# Patient Record
Sex: Female | Born: 1935 | Hispanic: No | Smoking: Never smoker
Health system: Southern US, Community
[De-identification: ages and names within clinical notes are randomized; demographics above are authoritative.]

## PROBLEM LIST (undated history)

## (undated) DIAGNOSIS — M549 Dorsalgia, unspecified: Secondary | ICD-10-CM

## (undated) DIAGNOSIS — F1190 Opioid use, unspecified, uncomplicated: Secondary | ICD-10-CM

## (undated) DIAGNOSIS — M810 Age-related osteoporosis without current pathological fracture: Secondary | ICD-10-CM

## (undated) DIAGNOSIS — I509 Heart failure, unspecified: Secondary | ICD-10-CM

## (undated) DIAGNOSIS — I341 Nonrheumatic mitral (valve) prolapse: Secondary | ICD-10-CM

## (undated) DIAGNOSIS — I10 Essential (primary) hypertension: Secondary | ICD-10-CM

## (undated) DIAGNOSIS — I2510 Atherosclerotic heart disease of native coronary artery without angina pectoris: Secondary | ICD-10-CM

## (undated) DIAGNOSIS — K219 Gastro-esophageal reflux disease without esophagitis: Secondary | ICD-10-CM

## (undated) DIAGNOSIS — M1990 Unspecified osteoarthritis, unspecified site: Secondary | ICD-10-CM

## (undated) DIAGNOSIS — IMO0001 Reserved for inherently not codable concepts without codable children: Secondary | ICD-10-CM

## (undated) DIAGNOSIS — H269 Unspecified cataract: Secondary | ICD-10-CM

## (undated) DIAGNOSIS — R52 Pain, unspecified: Secondary | ICD-10-CM

## (undated) DIAGNOSIS — E785 Hyperlipidemia, unspecified: Secondary | ICD-10-CM

## (undated) DIAGNOSIS — G8929 Other chronic pain: Secondary | ICD-10-CM

## (undated) HISTORY — DX: Other chronic pain: G89.29

## (undated) HISTORY — PX: BLADDER SURGERY: SHX569

## (undated) HISTORY — PX: APPENDECTOMY: SHX54

## (undated) HISTORY — PX: BREAST SURGERY: SHX581

## (undated) HISTORY — PX: SHOULDER SURGERY: SHX246

## (undated) HISTORY — DX: Unspecified cataract: H26.9

## (undated) HISTORY — DX: Hyperlipidemia, unspecified: E78.5

## (undated) HISTORY — DX: Nonrheumatic mitral (valve) prolapse: I34.1

## (undated) HISTORY — PX: ABDOMINAL HYSTERECTOMY: SHX81

## (undated) HISTORY — DX: Unspecified osteoarthritis, unspecified site: M19.90

## (undated) HISTORY — DX: Age-related osteoporosis without current pathological fracture: M81.0

---

## 2005-08-08 ENCOUNTER — Ambulatory Visit (HOSPITAL_COMMUNITY): Admission: RE | Admit: 2005-08-08 | Discharge: 2005-08-08 | Payer: Self-pay | Admitting: Gastroenterology

## 2005-08-28 ENCOUNTER — Encounter: Admission: RE | Admit: 2005-08-28 | Discharge: 2005-08-28 | Payer: Self-pay | Admitting: Family Medicine

## 2005-11-03 ENCOUNTER — Encounter: Admission: RE | Admit: 2005-11-03 | Discharge: 2005-11-03 | Payer: Self-pay | Admitting: Family Medicine

## 2006-03-22 ENCOUNTER — Encounter: Admission: RE | Admit: 2006-03-22 | Discharge: 2006-03-22 | Payer: Self-pay | Admitting: Family Medicine

## 2006-04-24 ENCOUNTER — Encounter: Admission: RE | Admit: 2006-04-24 | Discharge: 2006-04-24 | Payer: Self-pay | Admitting: Orthopedic Surgery

## 2006-12-23 ENCOUNTER — Emergency Department (HOSPITAL_COMMUNITY): Admission: EM | Admit: 2006-12-23 | Discharge: 2006-12-23 | Payer: Self-pay | Admitting: Emergency Medicine

## 2007-02-14 ENCOUNTER — Encounter: Admission: RE | Admit: 2007-02-14 | Discharge: 2007-02-14 | Payer: Self-pay | Admitting: Internal Medicine

## 2007-11-12 ENCOUNTER — Encounter: Admission: RE | Admit: 2007-11-12 | Discharge: 2007-11-12 | Payer: Self-pay | Admitting: General Practice

## 2008-03-24 ENCOUNTER — Encounter: Admission: RE | Admit: 2008-03-24 | Discharge: 2008-03-24 | Payer: Self-pay | Admitting: Internal Medicine

## 2008-04-01 ENCOUNTER — Encounter: Admission: RE | Admit: 2008-04-01 | Discharge: 2008-04-01 | Payer: Self-pay | Admitting: Internal Medicine

## 2008-09-16 ENCOUNTER — Encounter: Admission: RE | Admit: 2008-09-16 | Discharge: 2008-09-16 | Payer: Self-pay | Admitting: Internal Medicine

## 2008-10-14 ENCOUNTER — Encounter: Admission: RE | Admit: 2008-10-14 | Discharge: 2008-10-14 | Payer: Self-pay | Admitting: Internal Medicine

## 2008-10-14 ENCOUNTER — Other Ambulatory Visit: Admission: RE | Admit: 2008-10-14 | Discharge: 2008-10-14 | Payer: Self-pay | Admitting: Interventional Radiology

## 2008-10-14 ENCOUNTER — Encounter (INDEPENDENT_AMBULATORY_CARE_PROVIDER_SITE_OTHER): Payer: Self-pay | Admitting: Interventional Radiology

## 2009-05-07 ENCOUNTER — Encounter: Admission: RE | Admit: 2009-05-07 | Discharge: 2009-05-07 | Payer: Self-pay | Admitting: Internal Medicine

## 2009-06-07 ENCOUNTER — Ambulatory Visit: Payer: Self-pay | Admitting: Vascular Surgery

## 2009-09-13 ENCOUNTER — Ambulatory Visit: Payer: Self-pay | Admitting: Vascular Surgery

## 2009-11-01 ENCOUNTER — Ambulatory Visit: Payer: Self-pay | Admitting: Vascular Surgery

## 2009-11-08 ENCOUNTER — Ambulatory Visit: Payer: Self-pay | Admitting: Vascular Surgery

## 2010-04-14 ENCOUNTER — Inpatient Hospital Stay (HOSPITAL_COMMUNITY): Admission: RE | Admit: 2010-04-14 | Discharge: 2010-04-15 | Payer: Self-pay | Admitting: Orthopedic Surgery

## 2010-05-03 ENCOUNTER — Emergency Department (HOSPITAL_COMMUNITY): Admission: EM | Admit: 2010-05-03 | Discharge: 2010-05-03 | Payer: Self-pay | Admitting: Emergency Medicine

## 2010-08-04 ENCOUNTER — Encounter: Admission: RE | Admit: 2010-08-04 | Discharge: 2010-08-04 | Payer: Self-pay | Admitting: Internal Medicine

## 2010-09-12 ENCOUNTER — Encounter: Admission: RE | Admit: 2010-09-12 | Discharge: 2010-09-12 | Payer: Self-pay | Admitting: Internal Medicine

## 2010-10-30 ENCOUNTER — Encounter: Payer: Self-pay | Admitting: Internal Medicine

## 2010-12-24 LAB — CK TOTAL AND CKMB (NOT AT ARMC)
CK, MB: 2.1 ng/mL (ref 0.3–4.0)
Total CK: 174 U/L (ref 7–177)

## 2010-12-24 LAB — COMPREHENSIVE METABOLIC PANEL
ALT: 17 U/L (ref 0–35)
AST: 24 U/L (ref 0–37)
Albumin: 3.8 g/dL (ref 3.5–5.2)
Alkaline Phosphatase: 90 U/L (ref 39–117)
BUN: 6 mg/dL (ref 6–23)
CO2: 31 mEq/L (ref 19–32)
Calcium: 9.5 mg/dL (ref 8.4–10.5)
Chloride: 100 mEq/L (ref 96–112)
Creatinine, Ser: 0.72 mg/dL (ref 0.4–1.2)
GFR calc Af Amer: >60 mL/min (ref >60)
GFR calc non Af Amer: >60 mL/min (ref >60)
Glucose, Bld: 114 mg/dL — ABNORMAL HIGH (ref 70–99)
Potassium: 3.1 mEq/L — ABNORMAL LOW (ref 3.5–5.1)
Sodium: 140 mEq/L (ref 135–145)
Total Bilirubin: 0.7 mg/dL (ref 0.3–1.2)
Total Protein: 6.9 g/dL (ref 6.0–8.3)

## 2010-12-24 LAB — URINALYSIS, ROUTINE W REFLEX MICROSCOPIC
Bilirubin Urine: NEGATIVE
Glucose, UA: NEGATIVE mg/dL
Hgb urine dipstick: NEGATIVE
Ketones, ur: NEGATIVE mg/dL
Nitrite: NEGATIVE
Protein, ur: NEGATIVE mg/dL
Specific Gravity, Urine: 1.006 (ref 1.005–1.030)
Urobilinogen, UA: 0.2 mg/dL (ref 0.0–1.0)
pH: 8 (ref 5.0–8.0)

## 2010-12-24 LAB — DIFFERENTIAL
Basophils Absolute: 0 10*3/uL (ref 0.0–0.1)
Basophils Relative: 1 % (ref 0–1)
Eosinophils Absolute: 0 10*3/uL (ref 0.0–0.7)
Eosinophils Relative: 1 % (ref 0–5)
Lymphocytes Relative: 13 % (ref 12–46)
Lymphs Abs: 0.6 10*3/uL — ABNORMAL LOW (ref 0.7–4.0)
Monocytes Absolute: 0.4 10*3/uL (ref 0.1–1.0)
Monocytes Relative: 8 % (ref 3–12)
Neutro Abs: 3.6 10*3/uL (ref 1.7–7.7)
Neutrophils Relative %: 77 % (ref 43–77)

## 2010-12-24 LAB — CBC
HCT: 35.9 % — ABNORMAL LOW (ref 36.0–46.0)
Hemoglobin: 12.4 g/dL (ref 12.0–15.0)
MCH: 29.5 pg (ref 26.0–34.0)
MCHC: 34.4 g/dL (ref 30.0–36.0)
MCV: 85.8 fL (ref 78.0–100.0)
Platelets: 424 10*3/uL — ABNORMAL HIGH (ref 150–400)
RBC: 4.19 MIL/uL (ref 3.87–5.11)
RDW: 13.9 % (ref 11.5–15.5)
WBC: 4.7 10*3/uL (ref 4.0–10.5)

## 2010-12-24 LAB — POCT CARDIAC MARKERS
CKMB, poc: 2.1 ng/mL (ref 1.0–8.0)
Myoglobin, poc: 149 ng/mL (ref 12–200)
Troponin i, poc: <0.05 ng/mL (ref 0.00–0.09)

## 2010-12-24 LAB — LIPASE, BLOOD: Lipase: 29 U/L (ref 11–59)

## 2010-12-25 LAB — CBC
HCT: 37.9 % (ref 36.0–46.0)
MCH: 30 pg (ref 26.0–34.0)
MCHC: 34.3 g/dL (ref 30.0–36.0)
MCV: 87.4 fL (ref 78.0–100.0)
RDW: 13.5 % (ref 11.5–15.5)

## 2010-12-25 LAB — COMPREHENSIVE METABOLIC PANEL
Alkaline Phosphatase: 66 U/L (ref 39–117)
BUN: 8 mg/dL (ref 6–23)
Calcium: 9.8 mg/dL (ref 8.4–10.5)
Creatinine, Ser: 0.72 mg/dL (ref 0.4–1.2)
Glucose, Bld: 113 mg/dL — ABNORMAL HIGH (ref 70–99)
Potassium: 3.6 mEq/L (ref 3.5–5.1)
Total Protein: 6.7 g/dL (ref 6.0–8.3)

## 2010-12-25 LAB — URINALYSIS, ROUTINE W REFLEX MICROSCOPIC
Hgb urine dipstick: NEGATIVE
Protein, ur: NEGATIVE mg/dL
Urobilinogen, UA: 0.2 mg/dL (ref 0.0–1.0)

## 2010-12-25 LAB — PROTIME-INR
INR: 0.97 (ref 0.00–1.49)
Prothrombin Time: 12.8 seconds (ref 11.6–15.2)

## 2010-12-25 LAB — APTT: aPTT: 28 seconds (ref 24–37)

## 2010-12-25 LAB — SURGICAL PCR SCREEN: Staphylococcus aureus: NEGATIVE

## 2011-02-21 NOTE — Assessment & Plan Note (Signed)
OFFICE VISIT   GEANA, WALTS E  DOB:  01-05-1931                                       11/01/2009  IONGE#:[id number]   Patient underwent laser ablation of her right proximal great saphenous  vein which occluded in the mid thigh and then a second procedure with  laser ablation of the right distal great saphenous vein from mid calf to  the mid thigh with greater than 20 stab phlebectomies for painful  varicosities.  She tolerated the procedure well, return in 1 week for  follow-up.     Quita Skye Hart [place], M.D.  Electronically Signed   JDL/MEDQ  D:  11/01/2009  T:  11/02/2009  Job:  [id number]

## 2011-02-21 NOTE — Procedures (Signed)
LOWER EXTREMITY VENOUS REFLUX EXAM   INDICATION:  Right lower extremity varicose veins, history of right  lower extremity laser surgery.   EXAM:  Using color-flow imaging and pulse Doppler spectral analysis, the  right common femoral, superficial femoral, popliteal, posterior tibial,  greater and lesser saphenous veins are evaluated.  There is no evidence  suggesting deep venous insufficiency in the right lower extremity.   The right saphenofemoral junction is not competent.  The right GSV is  not competent with the caliber as described below.   The right proximal short saphenous vein demonstrates competency.   GSV Diameter (used if found to be incompetent only)                                            Right    Left  Proximal Greater Saphenous Vein           0.77 cm  cm  Proximal-to-mid-thigh                     cm       cm  Mid thigh                                 0.29 cm  cm  Mid-distal thigh                          cm       cm  Distal thigh                              0.57 cm  cm  Knee                                      0.7 cm   cm   IMPRESSION:  1. Right greater saphenous vein reflux is identified with the caliber      ranging from 0.29 cm to 0.77 cm knee to groin with a gap in the      proximal thigh, after which lateral branch reconnects.  2. The right greater saphenous vein is not aneurysmal.  3. The right greater saphenous vein is somewhat tortuous.  4. The deep venous system is competent.  5. The right lesser saphenous vein is competent.   ___________________________________________  Quita Skye. Hart [place], M.D.   AS/MEDQ  D:  06/07/2009  T:  06/07/2009  Job:  [id number]

## 2011-02-21 NOTE — Assessment & Plan Note (Signed)
OFFICE VISIT   QUETZALY, EBNER E  DOB:  Aug 17, 1931                                       11/08/2009  ZOXWR#:[id number]   The patient returns 1 week post laser ablation of right great saphenous  vein with multiple stab phlebectomies.  She had previously had a laser  ablation performed in [place], [place], 10 or 15 years ago.  She had 2  areas of patent saphenous vein, one in the distal thigh which had been  occluded in the mid thigh and then the proximal vein was also patent,  with reflux in both areas.  This required 2 laser treatments as well as  the stab phlebectomies.  She had an excellent early result with minimal  discomfort in the thigh, no pain in the calf, and has had no distal  edema.  She wore her stocking as instructed and took the ibuprofen  without difficulty.   On exam today, she has a palpable dorsalis pedis pulse with some  moderate ecchymosis in the distal thigh where the vein was ablated.  Stab phlebectomy wounds are all healing well.  Venous duplex exam today  reveals no evidence of deep venous obstruction and total closure of the  saphenous vein at the saphenofemoral junction.  I am very pleased with  her early result.  She will wear the stockings 1 more week and return to  see [place] on a p.r.n. basis.     Quita Skye Hart [place], M.D.  Electronically Signed   JDL/MEDQ  D:  11/08/2009  T:  11/09/2009  Job:  [id number]   cc:   Deirdre Peer. Polite, M.D.

## 2011-02-21 NOTE — Consult Note (Signed)
NEW PATIENT CONSULTATION   Claudia [name], Claudia [name]  DOB:  [birth year]                                       06/07/2009  JXBJY#:[id number]   This is a new patient consultation.   Patient is a 77 year old female patient who has painful varicosities in  the right leg, which are recurrent, involving thigh, calf, and ankle  area.  She has had laser ablation performed in [place], [place] about  10 years ago on the right leg with early improvement in her symptoms,  but these recurred a few years ago and have continued to progress, and  now she describes a continuous aching, throbbing, and burning discomfort  in the thigh and calf which worsens as the day progresses.  She also has  this at night, and this is not relieved by elevation or pain medicine.  She has had no deep venous thrombosis, thrombophlebitis or pulmonary  emboli and has no history of stasis ulcers or bleeding.  She does not  wear elastic compression stockings, but her symptoms are affecting her  significantly.  She states that she is unable to stand or walk long  distances.   Past medical history is negative for diabetes, coronary artery disease,  COPD, hyperlipidemia or stroke.   PAST SURGICAL HISTORY:  1. Laser ablation, right great saphenous vein.  2. Hysterectomy.  3. Bladder tack.   FAMILY HISTORY:  Positive for varicose veins in her mother and an aunt.  Diabetes in both parents.  Coronary artery disease in her father, who  died at age 79 of myocardial infarction.  Negative for stroke.   SOCIAL HISTORY:  She is widowed, has 4 children, is retired.  Previously  worked in the dialysis unit.  Does not use tobacco or alcohol.   REVIEW OF SYSTEMS:  Positive for reflux esophagitis, constipation,  arthritis, joint pain, lumbar spine disease, and rash.  Otherwise all  systems unremarkable.   ALLERGIES:  To sulfa.   MEDICATIONS:  Please see health history exam.   PHYSICAL EXAMINATION:  Blood pressure is  133/70, heart rate 72,  respirations 14.  Generally, she is a healthy-appearing female in no  apparent distress, alert and oriented x3.  Neck is supple, 3+ carotid  pulses palpable.  No bruits are audible.  Neurologic:  Normal.  No  palpable adenopathy in the neck.  Chest:  Clear to auscultation.  Cardiovascular:  Regular rhythm.  No murmurs.  Abdomen:  Soft, nontender  with no masses.  She has 3+ femoral, popliteal, and 2+ dorsalis pedis  pulses bilaterally.  Left leg is free of varicosities.  Right leg has  bulging varicosities in the medial thigh, over the great saphenous  system, and more severely below the knee in the medial calf, extending  down onto the ankle both medially and laterally with some  hyperpigmentation and 1+ edema.  There is no active ulcer.   Venous duplex exam today reveals a deep venous system to be normal on  the right.  There is no obstruction.  She has 2 areas of severe reflux,  one in the saphenofemoral junction extending down along the course of  the greater saphenous vein medially for about 6-8 cm, where it then  collateralizes with a lateral branch.  This is a straight segment.  There is another large continuation of the great saphenous vein from the  mid thigh to the mid calf with reflux.   I think the best plan would be to treat her initially with long-leg  elastic compression stockings (20 mm - 30 mm gradient) plus elevation of  the legs and ibuprofen.  If this does not improve her symptoms, she  should have laser ablation of 2 separate segments in 1 setting, one of  the great saphenous vein and saphenofemoral junction proximally and to  the more distal great saphenous vein up to the mid thigh, and perform  stab phlebectomies at that time.  This should be followed by 2 courses  of sclerotherapy for the smaller varicosities in the lower leg distally.  Will return in 3 months.   Quita Skye Hart [place], M.D.  Electronically Signed   JDL/MEDQ  D:  06/07/2009   T:  06/07/2009  Job:  [id number]   cc:   Deirdre Peer. Polite, M.D.

## 2011-02-21 NOTE — Assessment & Plan Note (Signed)
OFFICE VISIT   ENISA, RUNYAN  DOB:  [birth year]                                       09/13/2009  ZHYQM#:[id number]   The patient returns today for continued follow-up regarding her painful  varicosities in the right thigh and calf and ankle area which have been  becoming increasingly symptomatic.  She had treatment at [place],  [place] several years ago with laser ablation ,but has had progressive  recurrence of the varicosities in the thigh and medial calf area and  continues to have aching and throbbing discomfort, which is not relieved  by long-leg elastic compression stockings (20 mm -13 mm gradient) nor  elevation or analgesics (ibuprofen).  She has been having increasing  discomfort in her ankle and foot area the longer she stands diminishing  her ability to get around.  This is affecting his daily living both  during the day and at night when she also has discomfort.   Venous duplex exam revealed gross reflux at the right saphenofemoral  junction down to the proximal thigh with a small gap in the great  saphenous vein and then another large segment of gross reflux from the  proximal calf to the mid to proximal thigh area.  She has large bulging  varicosities in the medial calf and the mid thigh proximally.   Venous duplex exam also reveals no evidence of deep venous obstruction  and she does have 2-3+ pulses on physical exam.   I think the best treatment would be laser ablation of her right great  saphenous vein which will require two separate treatments, one in the  distal great saphenous vein and one in the proximal great saphenous vein  with multiple stab phlebectomies.  We will schedule that in the near  future to relieve this nice lady's symptoms.   Quita Skye Hart [place], M.D.  Electronically Signed   JDL/MEDQ  D:  09/13/2009  T:  09/14/2009  Job:  [id number]

## 2011-02-24 NOTE — Op Note (Signed)
NAMENARCISSA, MELDER NO.:  [id number]   MEDICAL RECORD NO.:  [id number]          PATIENT TYPE:  AMB   LOCATION:  ENDO                         FACILITY:  [facility]   PHYSICIAN:  Danise Edge, M.D.   DATE OF BIRTH:  07-06-1931   DATE OF PROCEDURE:  08/08/2005  DATE OF DISCHARGE:                                 OPERATIVE REPORT   REFERRING PHYSICIAN:  Ms. Rosezetta Schlatter [facility] at [facility].   PROCEDURE INDICATIONS:  Claudia [name] is a 77 year old female born 1931-03-31. Claudia [name] has chronic gastroesophageal reflux. When she takes her  Fosamax tablet, it occasionally hangs in her distal esophagus. Claudia [name] is  also scheduled to undergo her first screening colonoscopy with polypectomy  to prevent colon cancer.   I discussed with Claudia [name] the complications associated with upper and lower  gastrointestinal endoscopy, esophageal dilation, and polypectomy including a  15 per 1,000 risk of bleeding and 1 per 1,000 risk of intestinal perforation  requiring surgical repair. Claudia [name] has signed the operative permit.   ENDOSCOPIST:  Danise Edge, M.D.   PREMEDICATION:  Versed 4 mg, Demerol 50 mg.   PROCEDURE:  Screening colonoscopy to cecum. Anal inspection and digital  rectal exam were normal. The Olympus adjustable pediatric colonoscope was  introduced into the rectum and easily advanced to the cecum. A normal-  appearing appendiceal orifice and ileocecal valve were identified. Colonic  preparation for the exam today was satisfactory.   Rectum normal. Retroflexed view of the distal rectum normal.   Sigmoid colon and descending colon normal.   Splenic flexure normal.   Transverse colon normal.   Hepatic flexure normal.   Descending colon normal.   Cecum and ileocecal valve normal.   ASSESSMENT:  Normal screening proctocolonoscopy to the cecum.   PROCEDURE:  Diagnostic esophagogastroduodenoscopy. The Olympus gastroscope  was  passed through the posterior hypopharynx into the proximal esophagus  without difficulty. The hypopharynx, larynx and vocal cords appeared normal.   Esophagoscopy:  The proximal mid and lower segments of the esophageal mucosa  appeared completely normal. The squamocolumnar junction is noted at 35 cm  from the incisor teeth. There is no endoscopic evidence for the presence of  Barrett's esophagus, esophageal mucosal scarring or esophageal stricture  formation.   Gastroscopy:  Claudia [name] has a moderate-sized hiatal hernia. Retroflexed view  of the gastric cardia and fundus was normal. The diaphragmatic hiatus is  quite patulous. The gastric body, antrum and pylorus appeared normal.   Duodenoscopy:  The duodenal bulb and second portion of duodenum and third  portion of duodenum appeared normal.   ASSESSMENT:  Gastroesophageal reflux associated with a hiatal hernia and  patulous diaphragmatic hiatus. No endoscopic evidence for the presence of  Barrett's esophagus, esophageal stricture formation, erosive esophagitis.   RECOMMENDATIONS:  Recommend switching Claudia [name] from the Fosamax tablet to  the oral liquid Fosamax. She should undergo a repeat colon polyp screen in  approximately 10 years.           ______________________________  Danise Edge, M.D.  MJ/MEDQ  D:  08/08/2005  T:  08/08/2005  Job:  [id number]   cc:   Lavonda Jumbo, M.D.  Fax: [phone]

## 2011-02-24 NOTE — Procedures (Signed)
DUPLEX DEEP VENOUS EXAM - LOWER EXTREMITY   INDICATION:  Follow up laser ablation.   HISTORY:  Edema:  0  Trauma/Surgery:  Laser ablation of right greater saphenous vein.  Pain:  Yes  PE:  No  Previous DVT:  No  Anticoagulants:  No  Other:  No   DUPLEX EXAM:                CFV   SFV   PopV  PTV    GSV                R  L  R  L  R  L  R   L  R  L  Thrombosis    0  0  0     0     0      +  Spontaneous   +  +  +     +     +      0  Phasic        +  +  +     +     +      0  Augmentation  +  +  +     +     +      0  Compressible  +  +  +     +     +      0  Competent     +  +  +     +     +      0   Legend:  + - yes  o - no  p - partial  D - decreased   IMPRESSION:  There does not appear to be any deep vein thrombus noted;  however, the right greater saphenous vein appears with thrombus  proximally to the knee level.    _____________________________  Quita Skye. Hart [place], M.D.   CB/MEDQ  D:  11/08/2009  T:  11/09/2009  Job:  [id number]

## 2011-07-20 ENCOUNTER — Other Ambulatory Visit: Payer: Self-pay | Admitting: Internal Medicine

## 2011-07-20 DIAGNOSIS — Z1231 Encounter for screening mammogram for malignant neoplasm of breast: Secondary | ICD-10-CM

## 2011-08-08 ENCOUNTER — Ambulatory Visit: Payer: Self-pay

## 2011-09-06 ENCOUNTER — Ambulatory Visit
Admission: RE | Admit: 2011-09-06 | Discharge: 2011-09-06 | Disposition: A | Discharge status: Home / Self Care | Payer: Medicare Other | Source: Ambulatory Visit | Attending: Internal Medicine | Admitting: Internal Medicine

## 2011-09-06 DIAGNOSIS — Z1231 Encounter for screening mammogram for malignant neoplasm of breast: Secondary | ICD-10-CM

## 2011-10-13 DIAGNOSIS — L299 Pruritus, unspecified: Secondary | ICD-10-CM | POA: Diagnosis not present

## 2011-10-30 DIAGNOSIS — IMO0002 Reserved for concepts with insufficient information to code with codable children: Secondary | ICD-10-CM | POA: Diagnosis not present

## 2011-10-30 DIAGNOSIS — M47812 Spondylosis without myelopathy or radiculopathy, cervical region: Secondary | ICD-10-CM | POA: Diagnosis not present

## 2011-10-30 DIAGNOSIS — M47817 Spondylosis without myelopathy or radiculopathy, lumbosacral region: Secondary | ICD-10-CM | POA: Diagnosis not present

## 2011-10-30 DIAGNOSIS — M5137 Other intervertebral disc degeneration, lumbosacral region: Secondary | ICD-10-CM | POA: Diagnosis not present

## 2011-12-25 DIAGNOSIS — M47817 Spondylosis without myelopathy or radiculopathy, lumbosacral region: Secondary | ICD-10-CM | POA: Diagnosis not present

## 2011-12-25 DIAGNOSIS — M542 Cervicalgia: Secondary | ICD-10-CM | POA: Diagnosis not present

## 2011-12-25 DIAGNOSIS — M2550 Pain in unspecified joint: Secondary | ICD-10-CM | POA: Diagnosis not present

## 2012-01-18 DIAGNOSIS — E7800 Pure hypercholesterolemia, unspecified: Secondary | ICD-10-CM | POA: Diagnosis not present

## 2012-01-18 DIAGNOSIS — R079 Chest pain, unspecified: Secondary | ICD-10-CM | POA: Diagnosis not present

## 2012-01-18 DIAGNOSIS — I10 Essential (primary) hypertension: Secondary | ICD-10-CM | POA: Diagnosis not present

## 2012-01-23 DIAGNOSIS — E7800 Pure hypercholesterolemia, unspecified: Secondary | ICD-10-CM | POA: Diagnosis not present

## 2012-01-23 DIAGNOSIS — R079 Chest pain, unspecified: Secondary | ICD-10-CM | POA: Diagnosis not present

## 2012-01-23 DIAGNOSIS — I10 Essential (primary) hypertension: Secondary | ICD-10-CM | POA: Diagnosis not present

## 2012-02-01 DIAGNOSIS — Z961 Presence of intraocular lens: Secondary | ICD-10-CM | POA: Diagnosis not present

## 2012-02-01 DIAGNOSIS — H2510 Age-related nuclear cataract, unspecified eye: Secondary | ICD-10-CM | POA: Diagnosis not present

## 2012-02-01 DIAGNOSIS — H401290 Low-tension glaucoma, unspecified eye, stage unspecified: Secondary | ICD-10-CM | POA: Diagnosis not present

## 2012-02-01 DIAGNOSIS — H40019 Open angle with borderline findings, low risk, unspecified eye: Secondary | ICD-10-CM | POA: Diagnosis not present

## 2012-02-14 DIAGNOSIS — G8929 Other chronic pain: Secondary | ICD-10-CM | POA: Diagnosis not present

## 2012-02-14 DIAGNOSIS — I10 Essential (primary) hypertension: Secondary | ICD-10-CM | POA: Diagnosis not present

## 2012-02-14 DIAGNOSIS — E782 Mixed hyperlipidemia: Secondary | ICD-10-CM | POA: Diagnosis not present

## 2012-02-14 DIAGNOSIS — K219 Gastro-esophageal reflux disease without esophagitis: Secondary | ICD-10-CM | POA: Diagnosis not present

## 2012-02-14 DIAGNOSIS — M810 Age-related osteoporosis without current pathological fracture: Secondary | ICD-10-CM | POA: Diagnosis not present

## 2012-02-21 DIAGNOSIS — R42 Dizziness and giddiness: Secondary | ICD-10-CM | POA: Diagnosis not present

## 2012-02-21 DIAGNOSIS — I10 Essential (primary) hypertension: Secondary | ICD-10-CM | POA: Diagnosis not present

## 2012-02-21 DIAGNOSIS — E782 Mixed hyperlipidemia: Secondary | ICD-10-CM | POA: Diagnosis not present

## 2012-02-26 DIAGNOSIS — M2550 Pain in unspecified joint: Secondary | ICD-10-CM | POA: Diagnosis not present

## 2012-02-26 DIAGNOSIS — M48061 Spinal stenosis, lumbar region without neurogenic claudication: Secondary | ICD-10-CM | POA: Diagnosis not present

## 2012-02-29 DIAGNOSIS — J029 Acute pharyngitis, unspecified: Secondary | ICD-10-CM | POA: Diagnosis not present

## 2012-02-29 DIAGNOSIS — K146 Glossodynia: Secondary | ICD-10-CM | POA: Diagnosis not present

## 2012-03-12 ENCOUNTER — Other Ambulatory Visit (HOSPITAL_COMMUNITY): Payer: Self-pay | Admitting: *Deleted

## 2012-03-13 ENCOUNTER — Encounter (HOSPITAL_COMMUNITY): Admission: RE | Admit: 2012-03-13 | Payer: Medicare Other | Source: Ambulatory Visit

## 2012-03-19 DIAGNOSIS — M810 Age-related osteoporosis without current pathological fracture: Secondary | ICD-10-CM | POA: Diagnosis not present

## 2012-04-02 DIAGNOSIS — M79609 Pain in unspecified limb: Secondary | ICD-10-CM | POA: Diagnosis not present

## 2012-04-02 DIAGNOSIS — B351 Tinea unguium: Secondary | ICD-10-CM | POA: Diagnosis not present

## 2012-04-26 DIAGNOSIS — M2550 Pain in unspecified joint: Secondary | ICD-10-CM | POA: Diagnosis not present

## 2012-04-26 DIAGNOSIS — M48061 Spinal stenosis, lumbar region without neurogenic claudication: Secondary | ICD-10-CM | POA: Diagnosis not present

## 2012-04-26 DIAGNOSIS — M47817 Spondylosis without myelopathy or radiculopathy, lumbosacral region: Secondary | ICD-10-CM | POA: Diagnosis not present

## 2012-04-26 DIAGNOSIS — IMO0002 Reserved for concepts with insufficient information to code with codable children: Secondary | ICD-10-CM | POA: Diagnosis not present

## 2012-06-13 DIAGNOSIS — M47812 Spondylosis without myelopathy or radiculopathy, cervical region: Secondary | ICD-10-CM | POA: Diagnosis not present

## 2012-06-13 DIAGNOSIS — M47817 Spondylosis without myelopathy or radiculopathy, lumbosacral region: Secondary | ICD-10-CM | POA: Diagnosis not present

## 2012-06-13 DIAGNOSIS — IMO0002 Reserved for concepts with insufficient information to code with codable children: Secondary | ICD-10-CM | POA: Diagnosis not present

## 2012-07-11 DIAGNOSIS — M5137 Other intervertebral disc degeneration, lumbosacral region: Secondary | ICD-10-CM | POA: Diagnosis not present

## 2012-07-11 DIAGNOSIS — M48061 Spinal stenosis, lumbar region without neurogenic claudication: Secondary | ICD-10-CM | POA: Diagnosis not present

## 2012-07-11 DIAGNOSIS — IMO0002 Reserved for concepts with insufficient information to code with codable children: Secondary | ICD-10-CM | POA: Diagnosis not present

## 2012-07-11 DIAGNOSIS — M47812 Spondylosis without myelopathy or radiculopathy, cervical region: Secondary | ICD-10-CM | POA: Diagnosis not present

## 2012-08-08 DIAGNOSIS — H2510 Age-related nuclear cataract, unspecified eye: Secondary | ICD-10-CM | POA: Diagnosis not present

## 2012-08-08 DIAGNOSIS — Z961 Presence of intraocular lens: Secondary | ICD-10-CM | POA: Diagnosis not present

## 2012-08-08 DIAGNOSIS — H401290 Low-tension glaucoma, unspecified eye, stage unspecified: Secondary | ICD-10-CM | POA: Diagnosis not present

## 2012-08-08 DIAGNOSIS — H40019 Open angle with borderline findings, low risk, unspecified eye: Secondary | ICD-10-CM | POA: Diagnosis not present

## 2012-08-21 DIAGNOSIS — Z0000 Encounter for general adult medical examination without abnormal findings: Secondary | ICD-10-CM | POA: Diagnosis not present

## 2012-08-21 DIAGNOSIS — E782 Mixed hyperlipidemia: Secondary | ICD-10-CM | POA: Diagnosis not present

## 2012-08-21 DIAGNOSIS — M810 Age-related osteoporosis without current pathological fracture: Secondary | ICD-10-CM | POA: Diagnosis not present

## 2012-08-21 DIAGNOSIS — Z23 Encounter for immunization: Secondary | ICD-10-CM | POA: Diagnosis not present

## 2012-08-21 DIAGNOSIS — I10 Essential (primary) hypertension: Secondary | ICD-10-CM | POA: Diagnosis not present

## 2012-08-27 ENCOUNTER — Other Ambulatory Visit: Payer: Self-pay | Admitting: Internal Medicine

## 2012-08-27 DIAGNOSIS — Z1231 Encounter for screening mammogram for malignant neoplasm of breast: Secondary | ICD-10-CM

## 2012-09-12 DIAGNOSIS — M47817 Spondylosis without myelopathy or radiculopathy, lumbosacral region: Secondary | ICD-10-CM | POA: Diagnosis not present

## 2012-09-12 DIAGNOSIS — M2550 Pain in unspecified joint: Secondary | ICD-10-CM | POA: Diagnosis not present

## 2012-09-12 DIAGNOSIS — M47812 Spondylosis without myelopathy or radiculopathy, cervical region: Secondary | ICD-10-CM | POA: Diagnosis not present

## 2012-10-03 ENCOUNTER — Ambulatory Visit: Payer: Medicare Other

## 2012-10-31 ENCOUNTER — Ambulatory Visit
Admission: RE | Admit: 2012-10-31 | Discharge: 2012-10-31 | Disposition: A | Discharge status: Home / Self Care | Payer: Medicare Other | Source: Ambulatory Visit | Attending: Internal Medicine | Admitting: Internal Medicine

## 2012-10-31 DIAGNOSIS — Z1231 Encounter for screening mammogram for malignant neoplasm of breast: Secondary | ICD-10-CM

## 2013-01-13 DIAGNOSIS — M2550 Pain in unspecified joint: Secondary | ICD-10-CM | POA: Diagnosis not present

## 2013-01-13 DIAGNOSIS — M47812 Spondylosis without myelopathy or radiculopathy, cervical region: Secondary | ICD-10-CM | POA: Diagnosis not present

## 2013-01-13 DIAGNOSIS — M47817 Spondylosis without myelopathy or radiculopathy, lumbosacral region: Secondary | ICD-10-CM | POA: Diagnosis not present

## 2013-02-26 DIAGNOSIS — G8929 Other chronic pain: Secondary | ICD-10-CM | POA: Diagnosis not present

## 2013-02-26 DIAGNOSIS — M810 Age-related osteoporosis without current pathological fracture: Secondary | ICD-10-CM | POA: Diagnosis not present

## 2013-02-26 DIAGNOSIS — I10 Essential (primary) hypertension: Secondary | ICD-10-CM | POA: Diagnosis not present

## 2013-02-26 DIAGNOSIS — E782 Mixed hyperlipidemia: Secondary | ICD-10-CM | POA: Diagnosis not present

## 2013-03-12 DIAGNOSIS — M48061 Spinal stenosis, lumbar region without neurogenic claudication: Secondary | ICD-10-CM | POA: Diagnosis not present

## 2013-03-12 DIAGNOSIS — G894 Chronic pain syndrome: Secondary | ICD-10-CM | POA: Diagnosis not present

## 2013-03-12 DIAGNOSIS — M47817 Spondylosis without myelopathy or radiculopathy, lumbosacral region: Secondary | ICD-10-CM | POA: Diagnosis not present

## 2013-04-10 DIAGNOSIS — M47817 Spondylosis without myelopathy or radiculopathy, lumbosacral region: Secondary | ICD-10-CM | POA: Diagnosis not present

## 2013-04-10 DIAGNOSIS — G894 Chronic pain syndrome: Secondary | ICD-10-CM | POA: Diagnosis not present

## 2013-04-10 DIAGNOSIS — M48061 Spinal stenosis, lumbar region without neurogenic claudication: Secondary | ICD-10-CM | POA: Diagnosis not present

## 2013-04-23 DIAGNOSIS — M48061 Spinal stenosis, lumbar region without neurogenic claudication: Secondary | ICD-10-CM | POA: Diagnosis not present

## 2013-04-23 DIAGNOSIS — M47817 Spondylosis without myelopathy or radiculopathy, lumbosacral region: Secondary | ICD-10-CM | POA: Diagnosis not present

## 2013-04-23 DIAGNOSIS — G894 Chronic pain syndrome: Secondary | ICD-10-CM | POA: Diagnosis not present

## 2013-05-12 DIAGNOSIS — M47817 Spondylosis without myelopathy or radiculopathy, lumbosacral region: Secondary | ICD-10-CM | POA: Diagnosis not present

## 2013-05-12 DIAGNOSIS — M48061 Spinal stenosis, lumbar region without neurogenic claudication: Secondary | ICD-10-CM | POA: Diagnosis not present

## 2013-05-12 DIAGNOSIS — G894 Chronic pain syndrome: Secondary | ICD-10-CM | POA: Diagnosis not present

## 2013-06-24 DIAGNOSIS — G894 Chronic pain syndrome: Secondary | ICD-10-CM | POA: Diagnosis not present

## 2013-06-24 DIAGNOSIS — M47817 Spondylosis without myelopathy or radiculopathy, lumbosacral region: Secondary | ICD-10-CM | POA: Diagnosis not present

## 2013-06-24 DIAGNOSIS — M47812 Spondylosis without myelopathy or radiculopathy, cervical region: Secondary | ICD-10-CM | POA: Diagnosis not present

## 2013-06-24 DIAGNOSIS — M48061 Spinal stenosis, lumbar region without neurogenic claudication: Secondary | ICD-10-CM | POA: Diagnosis not present

## 2013-07-24 DIAGNOSIS — M48061 Spinal stenosis, lumbar region without neurogenic claudication: Secondary | ICD-10-CM | POA: Diagnosis not present

## 2013-07-24 DIAGNOSIS — M47817 Spondylosis without myelopathy or radiculopathy, lumbosacral region: Secondary | ICD-10-CM | POA: Diagnosis not present

## 2013-07-24 DIAGNOSIS — G894 Chronic pain syndrome: Secondary | ICD-10-CM | POA: Diagnosis not present

## 2013-08-22 DIAGNOSIS — M47817 Spondylosis without myelopathy or radiculopathy, lumbosacral region: Secondary | ICD-10-CM | POA: Diagnosis not present

## 2013-08-22 DIAGNOSIS — G894 Chronic pain syndrome: Secondary | ICD-10-CM | POA: Diagnosis not present

## 2013-08-22 DIAGNOSIS — Z79899 Other long term (current) drug therapy: Secondary | ICD-10-CM | POA: Diagnosis not present

## 2013-08-22 DIAGNOSIS — M48061 Spinal stenosis, lumbar region without neurogenic claudication: Secondary | ICD-10-CM | POA: Diagnosis not present

## 2013-09-23 DIAGNOSIS — M47817 Spondylosis without myelopathy or radiculopathy, lumbosacral region: Secondary | ICD-10-CM | POA: Diagnosis not present

## 2013-09-23 DIAGNOSIS — G894 Chronic pain syndrome: Secondary | ICD-10-CM | POA: Diagnosis not present

## 2013-09-23 DIAGNOSIS — M48061 Spinal stenosis, lumbar region without neurogenic claudication: Secondary | ICD-10-CM | POA: Diagnosis not present

## 2013-10-14 DIAGNOSIS — M47817 Spondylosis without myelopathy or radiculopathy, lumbosacral region: Secondary | ICD-10-CM | POA: Diagnosis not present

## 2013-10-20 ENCOUNTER — Other Ambulatory Visit: Payer: Self-pay

## 2013-10-20 DIAGNOSIS — Z1231 Encounter for screening mammogram for malignant neoplasm of breast: Secondary | ICD-10-CM

## 2013-10-23 DIAGNOSIS — G894 Chronic pain syndrome: Secondary | ICD-10-CM | POA: Diagnosis not present

## 2013-10-23 DIAGNOSIS — M48061 Spinal stenosis, lumbar region without neurogenic claudication: Secondary | ICD-10-CM | POA: Diagnosis not present

## 2013-10-23 DIAGNOSIS — M47817 Spondylosis without myelopathy or radiculopathy, lumbosacral region: Secondary | ICD-10-CM | POA: Diagnosis not present

## 2013-11-12 ENCOUNTER — Ambulatory Visit
Admission: RE | Admit: 2013-11-12 | Discharge: 2013-11-12 | Disposition: A | Discharge status: Home / Self Care | Payer: Self-pay | Source: Ambulatory Visit

## 2013-11-12 DIAGNOSIS — Z1231 Encounter for screening mammogram for malignant neoplasm of breast: Secondary | ICD-10-CM | POA: Diagnosis not present

## 2013-11-21 DIAGNOSIS — M48061 Spinal stenosis, lumbar region without neurogenic claudication: Secondary | ICD-10-CM | POA: Diagnosis not present

## 2013-11-21 DIAGNOSIS — M47812 Spondylosis without myelopathy or radiculopathy, cervical region: Secondary | ICD-10-CM | POA: Diagnosis not present

## 2013-11-21 DIAGNOSIS — M47817 Spondylosis without myelopathy or radiculopathy, lumbosacral region: Secondary | ICD-10-CM | POA: Diagnosis not present

## 2013-11-21 DIAGNOSIS — G894 Chronic pain syndrome: Secondary | ICD-10-CM | POA: Diagnosis not present

## 2013-12-13 ENCOUNTER — Emergency Department (HOSPITAL_COMMUNITY): Payer: Medicare Other

## 2013-12-13 ENCOUNTER — Encounter (HOSPITAL_COMMUNITY): Payer: Self-pay | Admitting: Emergency Medicine

## 2013-12-13 ENCOUNTER — Inpatient Hospital Stay (HOSPITAL_COMMUNITY)
Admission: EM | Admit: 2013-12-13 | Discharge: 2013-12-15 | DRG: 390 | Disposition: A | Discharge status: Home / Self Care | Payer: Medicare Other | Attending: Internal Medicine | Admitting: Internal Medicine

## 2013-12-13 DIAGNOSIS — K5649 Other impaction of intestine: Secondary | ICD-10-CM | POA: Diagnosis not present

## 2013-12-13 DIAGNOSIS — M51379 Other intervertebral disc degeneration, lumbosacral region without mention of lumbar back pain or lower extremity pain: Secondary | ICD-10-CM | POA: Diagnosis present

## 2013-12-13 DIAGNOSIS — M549 Dorsalgia, unspecified: Secondary | ICD-10-CM

## 2013-12-13 DIAGNOSIS — M5137 Other intervertebral disc degeneration, lumbosacral region: Secondary | ICD-10-CM | POA: Diagnosis present

## 2013-12-13 DIAGNOSIS — R1110 Vomiting, unspecified: Secondary | ICD-10-CM | POA: Diagnosis not present

## 2013-12-13 DIAGNOSIS — I10 Essential (primary) hypertension: Secondary | ICD-10-CM | POA: Diagnosis present

## 2013-12-13 DIAGNOSIS — K219 Gastro-esophageal reflux disease without esophagitis: Secondary | ICD-10-CM | POA: Diagnosis present

## 2013-12-13 DIAGNOSIS — R404 Transient alteration of awareness: Secondary | ICD-10-CM | POA: Diagnosis not present

## 2013-12-13 DIAGNOSIS — R9431 Abnormal electrocardiogram [ECG] [EKG]: Secondary | ICD-10-CM | POA: Diagnosis not present

## 2013-12-13 DIAGNOSIS — R109 Unspecified abdominal pain: Secondary | ICD-10-CM | POA: Diagnosis not present

## 2013-12-13 DIAGNOSIS — Z90710 Acquired absence of both cervix and uterus: Secondary | ICD-10-CM

## 2013-12-13 DIAGNOSIS — K5641 Fecal impaction: Secondary | ICD-10-CM | POA: Diagnosis present

## 2013-12-13 DIAGNOSIS — I2510 Atherosclerotic heart disease of native coronary artery without angina pectoris: Secondary | ICD-10-CM | POA: Diagnosis present

## 2013-12-13 DIAGNOSIS — G8929 Other chronic pain: Secondary | ICD-10-CM | POA: Diagnosis present

## 2013-12-13 DIAGNOSIS — F1120 Opioid dependence, uncomplicated: Secondary | ICD-10-CM | POA: Diagnosis present

## 2013-12-13 DIAGNOSIS — R112 Nausea with vomiting, unspecified: Secondary | ICD-10-CM | POA: Diagnosis not present

## 2013-12-13 DIAGNOSIS — K56609 Unspecified intestinal obstruction, unspecified as to partial versus complete obstruction: Principal | ICD-10-CM | POA: Diagnosis present

## 2013-12-13 DIAGNOSIS — R5381 Other malaise: Secondary | ICD-10-CM | POA: Diagnosis not present

## 2013-12-13 DIAGNOSIS — Z79899 Other long term (current) drug therapy: Secondary | ICD-10-CM | POA: Diagnosis not present

## 2013-12-13 DIAGNOSIS — F1190 Opioid use, unspecified, uncomplicated: Secondary | ICD-10-CM | POA: Diagnosis present

## 2013-12-13 HISTORY — DX: Reserved for inherently not codable concepts without codable children: IMO0001

## 2013-12-13 HISTORY — DX: Atherosclerotic heart disease of native coronary artery without angina pectoris: I25.10

## 2013-12-13 HISTORY — DX: Essential (primary) hypertension: I10

## 2013-12-13 HISTORY — DX: Gastro-esophageal reflux disease without esophagitis: K21.9

## 2013-12-13 HISTORY — DX: Dorsalgia, unspecified: M54.9

## 2013-12-13 LAB — COMPREHENSIVE METABOLIC PANEL
ALK PHOS: 78 U/L (ref 39–117)
ALT: 15 U/L (ref 0–35)
AST: 19 U/L (ref 0–37)
Albumin: 4.1 g/dL (ref 3.5–5.2)
BILIRUBIN TOTAL: 0.3 mg/dL (ref 0.3–1.2)
BUN: 13 mg/dL (ref 6–23)
CHLORIDE: 96 meq/L (ref 96–112)
CO2: 29 meq/L (ref 19–32)
Calcium: 9.9 mg/dL (ref 8.4–10.5)
Creatinine, Ser: 0.75 mg/dL (ref 0.50–1.10)
GFR, EST AFRICAN AMERICAN: 90 mL/min — AB (ref >90)
GFR, EST NON AFRICAN AMERICAN: 77 mL/min — AB (ref >90)
GLUCOSE: 121 mg/dL — AB (ref 70–99)
POTASSIUM: 3.2 meq/L — AB (ref 3.7–5.3)
SODIUM: 140 meq/L (ref 137–147)
Total Protein: 7.3 g/dL (ref 6.0–8.3)

## 2013-12-13 LAB — URINALYSIS, ROUTINE W REFLEX MICROSCOPIC
BILIRUBIN URINE: NEGATIVE
GLUCOSE, UA: NEGATIVE mg/dL
HGB URINE DIPSTICK: NEGATIVE
Ketones, ur: NEGATIVE mg/dL
Leukocytes, UA: NEGATIVE
Nitrite: NEGATIVE
PROTEIN: NEGATIVE mg/dL
Specific Gravity, Urine: 1.009 (ref 1.005–1.030)
UROBILINOGEN UA: 0.2 mg/dL (ref 0.0–1.0)
pH: 7.5 (ref 5.0–8.0)

## 2013-12-13 LAB — CBC WITH DIFFERENTIAL/PLATELET
BASOS ABS: 0 10*3/uL (ref 0.0–0.1)
Basophils Relative: 0 % (ref 0–1)
EOS PCT: 1 % (ref 0–5)
Eosinophils Absolute: 0.1 10*3/uL (ref 0.0–0.7)
HCT: 38.9 % (ref 36.0–46.0)
Hemoglobin: 13.3 g/dL (ref 12.0–15.0)
LYMPHS ABS: 0.9 10*3/uL (ref 0.7–4.0)
LYMPHS PCT: 10 % — AB (ref 12–46)
MCH: 29.4 pg (ref 26.0–34.0)
MCHC: 34.2 g/dL (ref 30.0–36.0)
MCV: 85.9 fL (ref 78.0–100.0)
Monocytes Absolute: 0.8 10*3/uL (ref 0.1–1.0)
Monocytes Relative: 9 % (ref 3–12)
NEUTROS ABS: 7.6 10*3/uL (ref 1.7–7.7)
Neutrophils Relative %: 81 % — ABNORMAL HIGH (ref 43–77)
PLATELETS: 230 10*3/uL (ref 150–400)
RBC: 4.53 MIL/uL (ref 3.87–5.11)
RDW: 13.2 % (ref 11.5–15.5)
WBC: 9.5 10*3/uL (ref 4.0–10.5)

## 2013-12-13 LAB — LIPASE, BLOOD: Lipase: 34 U/L (ref 11–59)

## 2013-12-13 MED ORDER — SODIUM CHLORIDE 0.9 % IV SOLN
INTRAVENOUS | Status: DC
Start: 2013-12-13 — End: 2013-12-13

## 2013-12-13 MED ORDER — MORPHINE SULFATE 4 MG/ML IJ SOLN
4.0000 mg | Freq: Once | INTRAMUSCULAR | Status: AC
  Administered 2013-12-13: 4 mg via INTRAVENOUS
  Filled 2013-12-13: qty 1

## 2013-12-13 MED ORDER — POTASSIUM CHLORIDE IN NACL 20-0.9 MEQ/L-% IV SOLN
INTRAVENOUS | Status: DC
  Administered 2013-12-13 – 2013-12-14 (×2): via INTRAVENOUS
  Filled 2013-12-13 (×3): qty 1000

## 2013-12-13 MED ORDER — DILTIAZEM HCL ER BEADS 240 MG PO CP24
360.0000 mg | ORAL_CAPSULE | Freq: Every day | ORAL | Status: DC
  Administered 2013-12-13 – 2013-12-14 (×2): 360 mg via ORAL
  Filled 2013-12-13 (×3): qty 1

## 2013-12-13 MED ORDER — ONDANSETRON HCL 4 MG/2ML IJ SOLN: 4.0000 mg | Freq: Four times a day (QID) | INTRAMUSCULAR | Status: DC | PRN

## 2013-12-13 MED ORDER — IOHEXOL 300 MG/ML  SOLN
100.0000 mL | Freq: Once | INTRAMUSCULAR | Status: AC | PRN
  Administered 2013-12-13: 100 mL via INTRAVENOUS

## 2013-12-13 MED ORDER — PANTOPRAZOLE SODIUM 40 MG IV SOLR
40.0000 mg | INTRAVENOUS | Status: DC
  Administered 2013-12-13: 40 mg via INTRAVENOUS
  Filled 2013-12-13 (×2): qty 40

## 2013-12-13 MED ORDER — PANTOPRAZOLE SODIUM 40 MG PO TBEC: 40.0000 mg | DELAYED_RELEASE_TABLET | Freq: Every day | ORAL | Status: DC

## 2013-12-13 MED ORDER — HEPARIN SODIUM (PORCINE) 5000 UNIT/ML IJ SOLN
5000.0000 [IU] | Freq: Three times a day (TID) | INTRAMUSCULAR | Status: DC
  Administered 2013-12-13 – 2013-12-15 (×6): 5000 [IU] via SUBCUTANEOUS
  Filled 2013-12-13 (×9): qty 1

## 2013-12-13 MED ORDER — IOHEXOL 300 MG/ML  SOLN
25.0000 mL | INTRAMUSCULAR | Status: DC | PRN
  Administered 2013-12-13: 25 mL via ORAL

## 2013-12-13 MED ORDER — FENTANYL 50 MCG/HR TD PT72
75.0000 ug | MEDICATED_PATCH | TRANSDERMAL | Status: DC
  Administered 2013-12-14: 75 ug via TRANSDERMAL
  Filled 2013-12-13 (×2): qty 1

## 2013-12-13 MED ORDER — SODIUM CHLORIDE 0.9 % IV BOLUS (SEPSIS)
1000.0000 mL | INTRAVENOUS | Status: AC
  Administered 2013-12-13: 1000 mL via INTRAVENOUS

## 2013-12-13 MED ORDER — MORPHINE SULFATE 15 MG PO TABS
15.0000 mg | ORAL_TABLET | Freq: Three times a day (TID) | ORAL | Status: DC | PRN
  Administered 2013-12-13 – 2013-12-15 (×5): 15 mg via ORAL
  Filled 2013-12-13 (×5): qty 1

## 2013-12-13 MED ORDER — NITROGLYCERIN 0.4 MG SL SUBL: 0.4000 mg | SUBLINGUAL_TABLET | SUBLINGUAL | Status: DC | PRN

## 2013-12-13 MED ORDER — ONDANSETRON HCL 4 MG/2ML IJ SOLN
4.0000 mg | Freq: Once | INTRAMUSCULAR | Status: AC
  Administered 2013-12-13: 4 mg via INTRAVENOUS
  Filled 2013-12-13: qty 2

## 2013-12-13 MED ORDER — ACETAMINOPHEN 650 MG RE SUPP: 650.0000 mg | Freq: Four times a day (QID) | RECTAL | Status: DC | PRN

## 2013-12-13 MED ORDER — ACETAMINOPHEN 325 MG PO TABS: 650.0000 mg | ORAL_TABLET | Freq: Four times a day (QID) | ORAL | Status: DC | PRN

## 2013-12-13 MED ORDER — ATORVASTATIN CALCIUM 10 MG PO TABS
10.0000 mg | ORAL_TABLET | Freq: Every day | ORAL | Status: DC
  Administered 2013-12-14: 10 mg via ORAL
  Filled 2013-12-13 (×3): qty 1

## 2013-12-13 MED ORDER — HYDROMORPHONE HCL PF 1 MG/ML IJ SOLN
1.0000 mg | INTRAMUSCULAR | Status: DC | PRN
  Administered 2013-12-14: 1 mg via INTRAVENOUS
  Filled 2013-12-13: qty 1

## 2013-12-13 MED ORDER — METOCLOPRAMIDE HCL 5 MG/ML IJ SOLN
5.0000 mg | Freq: Once | INTRAMUSCULAR | Status: AC
  Administered 2013-12-13: 5 mg via INTRAVENOUS
  Filled 2013-12-13: qty 2

## 2013-12-13 MED ORDER — SIMVASTATIN 20 MG PO TABS
20.0000 mg | ORAL_TABLET | Freq: Every evening | ORAL | Status: DC
  Filled 2013-12-13: qty 1

## 2013-12-13 MED ORDER — ONDANSETRON HCL 4 MG PO TABS: 4.0000 mg | ORAL_TABLET | Freq: Four times a day (QID) | ORAL | Status: DC | PRN

## 2013-12-13 NOTE — ED Notes (Signed)
Pt. arrived with EMS from home reports nausea and vomitting onset this morning , denies abdominal pain or diarrhea . No fever or chills. Pt. received Zofran IV by EMS with slight relief.

## 2013-12-13 NOTE — ED Notes (Signed)
Daughter -- Maryelizabeth KaufmannGiGi -- notified of admission.

## 2013-12-13 NOTE — ED Provider Notes (Signed)
CSN: [id number]     Arrival date & time 12/13/13  [id number] History   First MD Initiated Contact with Patient 12/13/13 0703     Chief Complaint  Patient presents with  . Emesis     (Consider location/radiation/quality/duration/timing/severity/associated sxs/prior Treatment) Patient is a 80 y.o. female presenting with vomiting. The history is provided by the patient.  Emesis Severity:  Moderate Duration:  3 hours Timing:  Constant Quality:  Stomach contents (green appearing) Progression:  Unchanged Chronicity:  New Recent urination:  Normal Relieved by:  Nothing Worsened by:  Nothing tried Ineffective treatments:  None tried Associated symptoms: no abdominal pain, no diarrhea, no fever and no headaches     Past Medical History  Diagnosis Date  . Back pain   . Hypertension   . Reflux   . Coronary artery disease    Past Surgical History  Procedure Laterality Date  . Appendectomy     History reviewed. No pertinent family history. History  Substance Use Topics  . Smoking status: Never Smoker   . Smokeless tobacco: Not on file  . Alcohol Use: No   OB History   Grav Para Term Preterm Abortions TAB SAB Ect Mult Living                 Review of Systems  Constitutional: Negative for fever and fatigue.  HENT: Negative for congestion and drooling.   Eyes: Negative for pain.  Respiratory: Negative for cough and shortness of breath.   Cardiovascular: Negative for chest pain.  Gastrointestinal: Positive for vomiting. Negative for nausea, abdominal pain and diarrhea.  Genitourinary: Negative for dysuria and hematuria.  Musculoskeletal: Negative for back pain, gait problem and neck pain.  Skin: Negative for color change.  Neurological: Negative for dizziness and headaches.  Hematological: Negative for adenopathy.  Psychiatric/Behavioral: Negative for behavioral problems.  All other systems reviewed and are negative.      Allergies  Review of patient's allergies indicates  not on file.  Home Medications  No current outpatient prescriptions on file. BP 147/58  Pulse 68  Temp(Src) 98.4 F (36.9 C) (Oral)  Resp 16  SpO2 98% Physical Exam  Nursing note and vitals reviewed. Constitutional: She is oriented to person, place, and time. She appears well-developed and well-nourished.  HENT:  Head: Normocephalic.  Mouth/Throat: Oropharynx is clear and moist. No oropharyngeal exudate.  Eyes: Conjunctivae and EOM are normal. Pupils are equal, round, and reactive to light.  Neck: Normal range of motion. Neck supple.  Cardiovascular: Normal rate, regular rhythm, normal heart sounds and intact distal pulses.  Exam reveals no gallop and no friction rub.   No murmur heard. Pulmonary/Chest: Effort normal and breath sounds normal. No respiratory distress. She has no wheezes.  Abdominal: Soft. Bowel sounds are normal. There is tenderness (mild epigastric tenderness to palpation.). There is no rebound and no guarding.  Musculoskeletal: Normal range of motion. She exhibits no edema and no tenderness.  Neurological: She is alert and oriented to person, place, and time.  Skin: Skin is warm and dry.  Psychiatric: She has a normal mood and affect. Her behavior is normal.    ED Course  Procedures (including critical care time) Labs Review Labs Reviewed  CBC WITH DIFFERENTIAL - Abnormal; Notable for the following:    Neutrophils Relative % 81 (*)    Lymphocytes Relative 10 (*)    All other components within normal limits  COMPREHENSIVE METABOLIC PANEL - Abnormal; Notable for the following:    Potassium 3.2 (*)  Glucose, Bld 121 (*)    GFR calc non Af Amer 77 (*)    GFR calc Af Amer 90 (*)    All other components within normal limits  LIPASE, BLOOD  URINALYSIS, ROUTINE W REFLEX MICROSCOPIC   Imaging Review Ct Abdomen Pelvis W Contrast  12/13/2013   CLINICAL DATA:  Acute nausea and vomiting.  EXAM: CT ABDOMEN AND PELVIS WITH CONTRAST  TECHNIQUE: Multidetector CT  imaging of the abdomen and pelvis was performed using the standard protocol following bolus administration of intravenous contrast.  CONTRAST:  OMNIPAQUE IOHEXOL 300 MG/ML  SOLN  COMPARISON:  None  FINDINGS: The lung bases are clear.  No pleural or pericardial effusion.  No focal liver abnormality identified. The gallbladder is normal. No biliary dilatation. Normal appearance of the pancreas. The spleen is unremarkable. The left adrenal gland appears normal. Normal appearance of the right adrenal gland. Right pelvic kidney. There are bilateral renal cysts identified. No obstructive uropathy or evidence of nephrolithiasis. The urinary bladder appears normal. Previous hysterectomy.  Calcified atherosclerotic disease involves the abdominal aorta. There is no aneurysm. No upper abdominal adenopathy identified. No pelvic or inguinal adenopathy noted.  The stomach is normal. Increase caliber of the proximal and mid small bowel loops which measure up to 3.2 cm. The distal small bowel loops are decreased to normal in caliber. Transition to decreased small bowel loops is identified within the central pelvis, image 52/series 5. No perforation, pneumatosis or abscess.  Moderate stool burden identified within the colon up to the rectum.  Review of the visualized osseous structures is significant for mild lumbar degenerative disc disease. No aggressive lytic or sclerotic bone lesions identified. There is an anterolisthesis of L3 on L4 and L4 on L5.  IMPRESSION: 1. Findings compatible with small bowel obstruction. Transition point is in the pelvis in may reflect adhesive disease. 2. Atherosclerotic disease. 3. Lumbar spondylosis. 4. Right pelvic kidney.   Electronically Signed   By: Signa Kell M.D.   On: 12/13/2013 09:48     EKG Interpretation   Date/Time:  Saturday December 13 2013 07:06:48 EST Ventricular Rate:  63 PR Interval:  163 QRS Duration: 97 QT Interval:  447 QTC Calculation: 458 R Axis:   -34 Text  Interpretation:  Sinus rhythm Left axis deviation Abnormal R-wave  progression, early transition Baseline wander in lead(s) II III aVF No  significant change since last tracing Confirmed by Khani Paino  MD, Onesti Bonfiglio  ([id number]) on 12/13/2013 7:24:08 AM      MDM   Final diagnoses:  Small bowel obstruction  Vomiting    7:24 AM 80 y.o. female who presents with nausea and vomiting which began abruptly this morning at approximately 5 AM. She denies any abdominal pain or diarrhea. Denies fever or chills. She does have some epigastric tenderness to palpation on my exam. She states that she is otherwise been well. She is afebrile and vital signs are unremarkable here. Will get screening labwork, IV fluids, nausea control, and CT scan due to abdominal pain on my exam.  The patient will be admitted to [facility] hospital[facility]. I also spoke w/ Dr. Lindie Spruce w/ [facility] who will see the pt. Any medications given in the ED during this visit are listed below:  Medications  iohexol (OMNIPAQUE) 300 MG/ML solution 25 mL (25 mLs Oral Contrast Given 12/13/13 0758)  0.9 %  sodium chloride infusion (not administered)  sodium chloride 0.9 % bolus 1,000 mL (1,000 mLs Intravenous New Bag/Given 12/13/13 0753)  metoCLOPramide (REGLAN) injection  5 mg (5 mg Intravenous Given 12/13/13 0754)  iohexol (OMNIPAQUE) 300 MG/ML solution 100 mL (100 mLs Intravenous Contrast Given 12/13/13 0924)  morphine 4 MG/ML injection 4 mg (4 mg Intravenous Given 12/13/13 1033)  ondansetron (ZOFRAN) injection 4 mg (4 mg Intravenous Given 12/13/13 1030)      Junius Argyle, MD 12/13/13 1114

## 2013-12-13 NOTE — Consult Note (Signed)
Reason for Consult:Small bowel obstruction  Referring Physician: Dr. Barbette Merino is an 80 y.o. female.  HPI: Acute onset of nausea and vomiting this AM about 0530, no abdominal pan, but has a history of chronic back pain and is seen in a pain clinic and followed.  Very nice lady.  Has never had this happen before.  Past surgical history of hysterectomy almost 50 years ago and appendectomy  Past Medical History  Diagnosis Date  . Back pain   . Hypertension   . Reflux   . Coronary artery disease     Past Surgical History  Procedure Laterality Date  . Appendectomy      History reviewed. No pertinent family history.  Social History:  reports that she has never smoked. She does not have any smokeless tobacco history on file. She reports that she does not drink alcohol or use illicit drugs.  Allergies:  Allergies  Allergen Reactions  . Benadryl [Diphenhydramine] Other (See Comments)    Makes me hyper  . Sulfa Antibiotics Nausea And Vomiting and Rash    Medications: I have reviewed the patient's current medications.  Results for orders placed during the hospital encounter of 12/13/13 (from the past 48 hour(s))  CBC WITH DIFFERENTIAL     Status: Abnormal   Collection Time    12/13/13  7:50 AM      Result Value Ref Range   WBC 9.5  4.0 - 10.5 K/uL   RBC 4.53  3.87 - 5.11 MIL/uL   Hemoglobin 13.3  12.0 - 15.0 g/dL   HCT 38.9  36.0 - 46.0 %   MCV 85.9  78.0 - 100.0 fL   MCH 29.4  26.0 - 34.0 pg   MCHC 34.2  30.0 - 36.0 g/dL   RDW 13.2  11.5 - 15.5 %   Platelets 230  150 - 400 K/uL   Neutrophils Relative % 81 (*) 43 - 77 %   Neutro Abs 7.6  1.7 - 7.7 K/uL   Lymphocytes Relative 10 (*) 12 - 46 %   Lymphs Abs 0.9  0.7 - 4.0 K/uL   Monocytes Relative 9  3 - 12 %   Monocytes Absolute 0.8  0.1 - 1.0 K/uL   Eosinophils Relative 1  0 - 5 %   Eosinophils Absolute 0.1  0.0 - 0.7 K/uL   Basophils Relative 0  0 - 1 %   Basophils Absolute 0.0  0.0 - 0.1 K/uL    COMPREHENSIVE METABOLIC PANEL     Status: Abnormal   Collection Time    12/13/13  7:50 AM      Result Value Ref Range   Sodium 140  137 - 147 mEq/L   Potassium 3.2 (*) 3.7 - 5.3 mEq/L   Chloride 96  96 - 112 mEq/L   CO2 29  19 - 32 mEq/L   Glucose, Bld 121 (*) 70 - 99 mg/dL   BUN 13  6 - 23 mg/dL   Creatinine, Ser 0.75  0.50 - 1.10 mg/dL   Calcium 9.9  8.4 - 10.5 mg/dL   Total Protein 7.3  6.0 - 8.3 g/dL   Albumin 4.1  3.5 - 5.2 g/dL   AST 19  0 - 37 U/L   ALT 15  0 - 35 U/L   Alkaline Phosphatase 78  39 - 117 U/L   Total Bilirubin 0.3  0.3 - 1.2 mg/dL   GFR calc non Af Amer 77 (*) >90 mL/min  GFR calc Af Amer 90 (*) >90 mL/min   Comment: (NOTE)     The eGFR has been calculated using the CKD EPI equation.     This calculation has not been validated in all clinical situations.     eGFR's persistently <90 mL/min signify possible Chronic Kidney     Disease.  LIPASE, BLOOD     Status: None   Collection Time    12/13/13  7:50 AM      Result Value Ref Range   Lipase 34  11 - 59 U/L  URINALYSIS, ROUTINE W REFLEX MICROSCOPIC     Status: None   Collection Time    12/13/13  8:53 AM      Result Value Ref Range   Color, Urine YELLOW  YELLOW   APPearance CLEAR  CLEAR   Specific Gravity, Urine 1.009  1.005 - 1.030   pH 7.5  5.0 - 8.0   Glucose, UA NEGATIVE  NEGATIVE mg/dL   Hgb urine dipstick NEGATIVE  NEGATIVE   Bilirubin Urine NEGATIVE  NEGATIVE   Ketones, ur NEGATIVE  NEGATIVE mg/dL   Protein, ur NEGATIVE  NEGATIVE mg/dL   Urobilinogen, UA 0.2  0.0 - 1.0 mg/dL   Nitrite NEGATIVE  NEGATIVE   Leukocytes, UA NEGATIVE  NEGATIVE   Comment: MICROSCOPIC NOT DONE ON URINES WITH NEGATIVE PROTEIN, BLOOD, LEUKOCYTES, NITRITE, OR GLUCOSE <1000 mg/dL.    Ct Abdomen Pelvis W Contrast  12/13/2013   CLINICAL DATA:  Acute nausea and vomiting.  EXAM: CT ABDOMEN AND PELVIS WITH CONTRAST  TECHNIQUE: Multidetector CT imaging of the abdomen and pelvis was performed using the standard protocol  following bolus administration of intravenous contrast.  CONTRAST:  122m OMNIPAQUE IOHEXOL 300 MG/ML  SOLN  COMPARISON:  None  FINDINGS: The lung bases are clear.  No pleural or pericardial effusion.  No focal liver abnormality identified. The gallbladder is normal. No biliary dilatation. Normal appearance of the pancreas. The spleen is unremarkable. The left adrenal gland appears normal. Normal appearance of the right adrenal gland. Right pelvic kidney. There are bilateral renal cysts identified. No obstructive uropathy or evidence of nephrolithiasis. The urinary bladder appears normal. Previous hysterectomy.  Calcified atherosclerotic disease involves the abdominal aorta. There is no aneurysm. No upper abdominal adenopathy identified. No pelvic or inguinal adenopathy noted.  The stomach is normal. Increase caliber of the proximal and mid small bowel loops which measure up to 3.2 cm. The distal small bowel loops are decreased to normal in caliber. Transition to decreased small bowel loops is identified within the central pelvis, image 52/series 5. No perforation, pneumatosis or abscess.  Moderate stool burden identified within the colon up to the rectum.  Review of the visualized osseous structures is significant for mild lumbar degenerative disc disease. No aggressive lytic or sclerotic bone lesions identified. There is an anterolisthesis of L3 on L4 and L4 on L5.  IMPRESSION: 1. Findings compatible with small bowel obstruction. Transition point is in the pelvis in may reflect adhesive disease. 2. Atherosclerotic disease. 3. Lumbar spondylosis. 4. Right pelvic kidney.   Electronically Signed   By: TKerby MoorsM.D.   On: 12/13/2013 09:48    Review of Systems  Gastrointestinal: Positive for nausea, vomiting and abdominal pain (mild abdominal pain after vomiting.).  All other systems reviewed and are negative.   Blood pressure 142/54, pulse 68, temperature 98.4 F (36.9 C), temperature source Oral, resp.  rate 18, SpO2 97.00%. Physical Exam  Constitutional: She appears well-developed and well-nourished.  HENT:  Head: Normocephalic and atraumatic.  Eyes: Conjunctivae and EOM are normal. Pupils are equal, round, and reactive to light.  Neck: Normal range of motion. Neck supple.  Cardiovascular: Normal rate and regular rhythm.   Respiratory: Effort normal.  GI: Soft. There is no tenderness.    Tinkles and rushes  Musculoskeletal: Normal range of motion.  Neurological: She is alert.  Skin: Skin is warm and dry.  Psychiatric: She has a normal mood and affect. Her behavior is normal. Judgment and thought content normal.    Assessment/Plan: I have reviewed the patient's CT scan and in addition to a moderate grade partial SBO she is intensely constipated with packed stool from her rectum to her cecum.  This is likely secondary to her chronic narcotic usage.  I do not believe that an NGT will be very helpful, but surgery is definitely not necessary at this time.  Gwenyth Ober 12/13/2013, 10:34 AM

## 2013-12-13 NOTE — ED Notes (Signed)
Daughter-- Camille BalGiGI Tatum -- [phone]  Please call with room number or any concerns. (works at [employer])

## 2013-12-13 NOTE — ED Notes (Signed)
Patient transported to CT 

## 2013-12-13 NOTE — ED Notes (Signed)
Dr Wyatt at bedside.  

## 2013-12-13 NOTE — ED Notes (Signed)
Pt c/o pain-- and "a little nausea" . Dr, Romeo AppleHarrison informed-- orders received.

## 2013-12-13 NOTE — ED Notes (Signed)
Pt is back from CT, pt placed back on the monitor,

## 2013-12-13 NOTE — H&P (Signed)
[facility] History and Physical  Claudia Claudia [name] Claudia [name] XBJ:[id number] DOB: May 30, 1931 DOA: 12/13/2013  Referring physician: Dr. Romeo [name] PCP: Claudia Apo, MD   Chief Complaint: Small bowel obstruction  HPI: Claudia Claudia [name] Claudia [name] is Claudia [name] 80 y.o. female with past medical history of chronic narcotics use secondary to chronic back pain. Patient came into the hospital because of nausea and vomiting. Patient was in her usual state of health till she went to bed last night, she woke up around 5:30 Claudia [name].m. to have bowel movement, while she is having Claudia [name] bowel movement she felt sick and she vomited. Continued to have nausea and vomited 3 times since then. In the ED CT scan was done and showed small bowel obstruction with transition point, general surgery consulted.  Review of Systems:  Constitutional: negative for anorexia, fevers and sweats Eyes: negative for irritation, redness and visual disturbance Ears, nose, mouth, throat, and face: negative for earaches, epistaxis, nasal congestion and sore throat Respiratory: negative for cough, dyspnea on exertion, sputum and wheezing Cardiovascular: negative for chest pain, dyspnea, lower extremity edema, orthopnea, palpitations and syncope Gastrointestinal: Positive for nausea and vomiting Genitourinary:negative for dysuria, frequency and hematuria Hematologic/lymphatic: negative for bleeding, easy bruising and lymphadenopathy Musculoskeletal:negative for arthralgias, muscle weakness and stiff joints Neurological: negative for coordination problems, gait problems, headaches and weakness Endocrine: negative for diabetic symptoms including polydipsia, polyuria and weight loss Allergic/Immunologic: negative for anaphylaxis, hay fever and urticaria  Past Medical History  Diagnosis Date  . Back pain   . Hypertension   . Reflux   . Coronary artery disease    Past Surgical History  Procedure Laterality Date  . Appendectomy     Social History:  reports that she  has never smoked. She does not have any smokeless tobacco history on file. She reports that she does not drink alcohol or use illicit drugs.  Allergies  Allergen Reactions  . Benadryl [Diphenhydramine] Other (See Comments)    Makes me hyper  . Sulfa Antibiotics Nausea And Vomiting and Rash    History reviewed. No pertinent family history.   Prior to Admission medications   Medication Sig Start Date End Date Taking? Authorizing Provider  diltiazem (TIAZAC) 360 MG 24 hr capsule Take 360 mg by mouth daily.   Yes Historical Provider, MD  esomeprazole (NEXIUM) 40 MG capsule Take 40 mg by mouth daily at 12 noon.   Yes Historical Provider, MD  fentaNYL (DURAGESIC - DOSED MCG/HR) 75 MCG/HR Place 75 mcg onto the skin every other day.   Yes Historical Provider, MD  morphine (MSIR) 15 MG tablet Take 15 mg by mouth every 8 (eight) hours as needed for severe pain.   Yes Historical Provider, MD  nitroGLYCERIN (NITROSTAT) 0.4 MG SL tablet Place 0.4 mg under the tongue every 5 (five) minutes as needed for chest pain.   Yes Historical Provider, MD  pantoprazole (PROTONIX) 40 MG tablet Take 40 mg by mouth daily.   Yes Historical Provider, MD  simvastatin (ZOCOR) 20 MG tablet Take 20 mg by mouth every evening.   Yes Historical Provider, MD  triamterene-hydrochlorothiazide (DYAZIDE) 37.5-25 MG per capsule Take 1 capsule by mouth daily.   Yes Historical Provider, MD   Physical Exam: Filed Vitals:   12/13/13 1217  BP: 133/63  Pulse: 74  Temp: 99.7 F (37.6 C)  Resp: 18    BP 133/63  Pulse 74  Temp(Src) 99.7 F (37.6 C) (Oral)  Resp 18  Ht 5' (1.524 m)  Wt 53.1 kg (117 lb 1  oz)  BMI 22.86 kg/m2  SpO2 92%  General:  Appears calm and comfortable Eyes: PERRL, normal lids, irises & conjunctiva ENT: grossly normal hearing, lips & tongue Neck: no LAD, masses or thyromegaly Cardiovascular: RRR, no m/r/g. No LE edema. Telemetry: SR, no arrhythmias  Respiratory: CTA bilaterally, no w/r/r. Normal  respiratory effort. Abdomen: soft, mild generalized tenderness Skin: no rash or induration seen on limited exam Musculoskeletal: grossly normal tone BUE/BLE Psychiatric: grossly normal mood and affect, speech fluent and appropriate Neurologic: grossly non-focal.          Labs on Admission:  Basic Metabolic Panel:  Recent Labs Lab 12/13/13 0750  NA 140  K 3.2*  CL 96  CO2 29  GLUCOSE 121*  BUN 13  CREATININE 0.75  CALCIUM 9.9   Liver Function Tests:  Recent Labs Lab 12/13/13 0750  AST 19  ALT 15  ALKPHOS 78  BILITOT 0.3  PROT 7.3  ALBUMIN 4.1    Recent Labs Lab 12/13/13 0750  LIPASE 34   No results found for this basename: AMMONIA,  in the last 168 hours CBC:  Recent Labs Lab 12/13/13 0750  WBC 9.5  NEUTROABS 7.6  HGB 13.3  HCT 38.9  MCV 85.9  PLT 230   Cardiac Enzymes: No results found for this basename: CKTOTAL, CKMB, CKMBINDEX, TROPONINI,  in the last 168 hours  BNP (last 3 results) No results found for this basename: PROBNP,  in the last 8760 hours CBG: No results found for this basename: GLUCAP,  in the last 168 hours  Radiological Exams on Admission: Ct Abdomen Pelvis W Contrast  12/13/2013   CLINICAL DATA:  Acute nausea and vomiting.  EXAM: CT ABDOMEN AND PELVIS WITH CONTRAST  TECHNIQUE: Multidetector CT imaging of the abdomen and pelvis was performed using the standard protocol following bolus administration of intravenous contrast.  CONTRAST:  OMNIPAQUE IOHEXOL 300 MG/ML  SOLN  COMPARISON:  None  FINDINGS: The lung bases are clear.  No pleural or pericardial effusion.  No focal liver abnormality identified. The gallbladder is normal. No biliary dilatation. Normal appearance of the pancreas. The spleen is unremarkable. The left adrenal gland appears normal. Normal appearance of the right adrenal gland. Right pelvic kidney. There are bilateral renal cysts identified. No obstructive uropathy or evidence of nephrolithiasis. The urinary bladder  appears normal. Previous hysterectomy.  Calcified atherosclerotic disease involves the abdominal aorta. There is no aneurysm. No upper abdominal adenopathy identified. No pelvic or inguinal adenopathy noted.  The stomach is normal. Increase caliber of the proximal and mid small bowel loops which measure up to 3.2 cm. The distal small bowel loops are decreased to normal in caliber. Transition to decreased small bowel loops is identified within the central pelvis, image 52/series 5. No perforation, pneumatosis or abscess.  Moderate stool burden identified within the colon up to the rectum.  Review of the visualized osseous structures is significant for mild lumbar degenerative disc disease. No aggressive lytic or sclerotic bone lesions identified. There is an anterolisthesis of L3 on L4 and L4 on L5.  IMPRESSION: 1. Findings compatible with small bowel obstruction. Transition point is in the pelvis in may reflect adhesive disease. 2. Atherosclerotic disease. 3. Lumbar spondylosis. 4. Right pelvic kidney.   Electronically Signed   By: Signa Kell M.D.   On: 12/13/2013 09:48    EKG: Independently reviewed.   Assessment/Plan Principal Problem:   SBO (small bowel obstruction) Active Problems:   Small bowel obstruction   Vomiting  Chronic narcotic use   Chronic back pain   Hypertension    Small bowel obstruction -Partial small bowel obstruction as patient had 2 bowel movements earlier today. -General surgery consulted. -Cannot rule out this is related to chronic narcotics use.  Chronic narcotic use -Patient is on Duragesic patch 75 mcg and MSIR every 8 hours as needed for pain. -Continue narcotics to avoid withdrawal. -Start laxatives/bowel regimen when it's okay with general surgery.  Hypertension -Patient is on diltiazem continue, Dyazide held.  Code Status: Full code Family Communication: Plan discussed with the patient and her daughter Claudia Claudia [name] Claudia [name] over the phone (she is Claudia [name] CDI nurse at  [employer]). Disposition Plan: MedSurg  Time spent: 70 minutes  [facility],TheELMAHI,Claudia Claudia [name] Claudia [name] [facility] Pager [phone]

## 2013-12-13 NOTE — ED Notes (Signed)
Pt knows that urine is needed 

## 2013-12-14 ENCOUNTER — Inpatient Hospital Stay (HOSPITAL_COMMUNITY): Payer: Medicare Other

## 2013-12-14 DIAGNOSIS — K5649 Other impaction of intestine: Secondary | ICD-10-CM | POA: Diagnosis not present

## 2013-12-14 DIAGNOSIS — I10 Essential (primary) hypertension: Secondary | ICD-10-CM | POA: Diagnosis not present

## 2013-12-14 DIAGNOSIS — K56609 Unspecified intestinal obstruction, unspecified as to partial versus complete obstruction: Secondary | ICD-10-CM | POA: Diagnosis not present

## 2013-12-14 DIAGNOSIS — R112 Nausea with vomiting, unspecified: Secondary | ICD-10-CM | POA: Diagnosis not present

## 2013-12-14 LAB — BASIC METABOLIC PANEL
BUN: 13 mg/dL (ref 6–23)
CALCIUM: 8.5 mg/dL (ref 8.4–10.5)
CO2: 27 meq/L (ref 19–32)
CREATININE: 0.78 mg/dL (ref 0.50–1.10)
Chloride: 105 mEq/L (ref 96–112)
GFR, EST AFRICAN AMERICAN: 88 mL/min — AB (ref >90)
GFR, EST NON AFRICAN AMERICAN: 76 mL/min — AB (ref >90)
Glucose, Bld: 86 mg/dL (ref 70–99)
Potassium: 3.5 mEq/L — ABNORMAL LOW (ref 3.7–5.3)
Sodium: 143 mEq/L (ref 137–147)

## 2013-12-14 LAB — CBC
HCT: 35.3 % — ABNORMAL LOW (ref 36.0–46.0)
Hemoglobin: 11.8 g/dL — ABNORMAL LOW (ref 12.0–15.0)
MCH: 28.9 pg (ref 26.0–34.0)
MCHC: 33.4 g/dL (ref 30.0–36.0)
MCV: 86.5 fL (ref 78.0–100.0)
Platelets: 206 10*3/uL (ref 150–400)
RBC: 4.08 MIL/uL (ref 3.87–5.11)
RDW: 13.4 % (ref 11.5–15.5)
WBC: 5.5 10*3/uL (ref 4.0–10.5)

## 2013-12-14 LAB — TSH: TSH: 0.764 u[IU]/mL (ref 0.350–4.500)

## 2013-12-14 MED ORDER — POTASSIUM CHLORIDE CRYS ER 20 MEQ PO TBCR
20.0000 meq | EXTENDED_RELEASE_TABLET | Freq: Once | ORAL | Status: AC
  Administered 2013-12-14: 20 meq via ORAL
  Filled 2013-12-14: qty 1

## 2013-12-14 MED ORDER — DOCUSATE SODIUM 100 MG PO CAPS
100.0000 mg | ORAL_CAPSULE | Freq: Every day | ORAL | Status: DC
  Administered 2013-12-14 – 2013-12-15 (×2): 100 mg via ORAL
  Filled 2013-12-14 (×2): qty 1

## 2013-12-14 MED ORDER — FLEET ENEMA 7-19 GM/118ML RE ENEM
1.0000 | ENEMA | Freq: Once | RECTAL | Status: DC
  Filled 2013-12-14: qty 1

## 2013-12-14 MED ORDER — DOCUSATE SODIUM 100 MG PO CAPS: 100.0000 mg | ORAL_CAPSULE | Freq: Two times a day (BID) | ORAL | Status: DC | PRN

## 2013-12-14 MED ORDER — POLYETHYLENE GLYCOL 3350 17 G PO PACK
17.0000 g | PACK | Freq: Every day | ORAL | Status: DC
  Administered 2013-12-14 – 2013-12-15 (×2): 17 g via ORAL
  Filled 2013-12-14 (×2): qty 1

## 2013-12-14 MED ORDER — PANTOPRAZOLE SODIUM 40 MG PO TBEC
40.0000 mg | DELAYED_RELEASE_TABLET | Freq: Every day | ORAL | Status: DC
  Administered 2013-12-14: 40 mg via ORAL
  Filled 2013-12-14: qty 1

## 2013-12-14 NOTE — Progress Notes (Signed)
Subjective: Pt had BM x2 Feel better No N/V  Objective: Vital signs in last 24 hours: Temp:  [98.7 F (37.1 C)-99.7 F (37.6 C)] 98.7 F (37.1 C) (03/08 0503) Pulse Rate:  [68-78] 69 (03/08 0503) Resp:  [14-18] 18 (03/08 0503) BP: (117-151)/(53-63) 117/62 mmHg (03/08 0503) SpO2:  [92 %-97 %] 95 % (03/08 0503) Weight:  [53.1 kg (117 lb 1 oz)] 53.1 kg (117 lb 1 oz) (03/07 1217) Weight change:  Last BM Date: 12/14/13  Intake/Output from previous day: 03/07 0701 - 03/08 0700 In: 771.3 [I.V.:771.3] Out: -  Intake/Output this shift:    General appearance: alert Resp: clear to auscultation bilaterally Cardio: regular rate and rhythm GI: soft, non-tender; bowel sounds normal; no masses,  no organomegaly  Lab Results:  Recent Labs  12/13/13 0750 12/14/13 0625  WBC 9.5 5.5  HGB 13.3 11.8*  HCT 38.9 35.3*  PLT 230 206   BMET  Recent Labs  12/13/13 0750 12/14/13 0625  NA 140 143  K 3.2* 3.5*  CL 96 105  CO2 29 27  GLUCOSE 121* 86  BUN 13 13  CREATININE 0.75 0.78  CALCIUM 9.9 8.5    Studies/Results: Ct Abdomen Pelvis W Contrast  12/13/2013   CLINICAL DATA:  Acute nausea and vomiting.  EXAM: CT ABDOMEN AND PELVIS WITH CONTRAST  TECHNIQUE: Multidetector CT imaging of the abdomen and pelvis was performed using the standard protocol following bolus administration of intravenous contrast.  CONTRAST:  100mL OMNIPAQUE IOHEXOL 300 MG/ML  SOLN  COMPARISON:  None  FINDINGS: The lung bases are clear.  No pleural or pericardial effusion.  No focal liver abnormality identified. The gallbladder is normal. No biliary dilatation. Normal appearance of the pancreas. The spleen is unremarkable. The left adrenal gland appears normal. Normal appearance of the right adrenal gland. Right pelvic kidney. There are bilateral renal cysts identified. No obstructive uropathy or evidence of nephrolithiasis. The urinary bladder appears normal. Previous hysterectomy.  Calcified atherosclerotic disease  involves the abdominal aorta. There is no aneurysm. No upper abdominal adenopathy identified. No pelvic or inguinal adenopathy noted.  The stomach is normal. Increase caliber of the proximal and mid small bowel loops which measure up to 3.2 cm. The distal small bowel loops are decreased to normal in caliber. Transition to decreased small bowel loops is identified within the central pelvis, image 52/series 5. No perforation, pneumatosis or abscess.  Moderate stool burden identified within the colon up to the rectum.  Review of the visualized osseous structures is significant for mild lumbar degenerative disc disease. No aggressive lytic or sclerotic bone lesions identified. There is an anterolisthesis of L3 on L4 and L4 on L5.  IMPRESSION: 1. Findings compatible with small bowel obstruction. Transition point is in the pelvis in may reflect adhesive disease. 2. Atherosclerotic disease. 3. Lumbar spondylosis. 4. Right pelvic kidney.   Electronically Signed   By: Signa Kellaylor  Stroud M.D.   On: 12/13/2013 09:48    Medications: I have reviewed the patient's current medications.  Assessment/Plan: P SBO: improved- advance diet today per surgery- no surgery Bowel regiment with miralax and colace  low K- replace ambulate today HTN BP ok  LOS: 1 day   Dajah Fischman 12/14/2013, 8:58 AM

## 2013-12-14 NOTE — Progress Notes (Signed)
Subjective: Pt's pain and N/V resolved.  Ambulating well.  Had 2 BM's last night and a lot of gas.  Hungry.  Wants to go home.  Objective: Vital signs in last 24 hours: Temp:  [98.7 F (37.1 C)-99.7 F (37.6 C)] 98.7 F (37.1 C) (03/08 0503) Pulse Rate:  [67-78] 69 (03/08 0503) Resp:  [14-18] 18 (03/08 0503) BP: (117-160)/(53-80) 117/62 mmHg (03/08 0503) SpO2:  [92 %-98 %] 95 % (03/08 0503) Weight:  [117 lb 1 oz (53.1 kg)] 117 lb 1 oz (53.1 kg) (03/07 1217) Last BM Date: 12/13/13  Intake/Output from previous day: 03/07 0701 - 03/08 0700 In: 771.3 [I.V.:771.3] Out: -  Intake/Output this shift:    PE: Gen:  Alert, NAD, pleasant Abd: Soft, NT/ND, +BS, no HSM   Lab Results:   Recent Labs  12/13/13 0750 12/14/13 0625  WBC 9.5 5.5  HGB 13.3 11.8*  HCT 38.9 35.3*  PLT 230 206   BMET  Recent Labs  12/13/13 0750 12/14/13 0625  NA 140 143  K 3.2* 3.5*  CL 96 105  CO2 29 27  GLUCOSE 121* 86  BUN 13 13  CREATININE 0.75 0.78  CALCIUM 9.9 8.5   PT/INR No results found for this basename: LABPROT, INR,  in the last 72 hours CMP     Component Value Date/Time   NA 143 12/14/2013 0625   K 3.5* 12/14/2013 0625   CL 105 12/14/2013 0625   CO2 27 12/14/2013 0625   GLUCOSE 86 12/14/2013 0625   BUN 13 12/14/2013 0625   CREATININE 0.78 12/14/2013 0625   CALCIUM 8.5 12/14/2013 0625   PROT 7.3 12/13/2013 0750   ALBUMIN 4.1 12/13/2013 0750   AST 19 12/13/2013 0750   ALT 15 12/13/2013 0750   ALKPHOS 78 12/13/2013 0750   BILITOT 0.3 12/13/2013 0750   GFRNONAA 76* 12/14/2013 0625   GFRAA 88* 12/14/2013 0625   Lipase     Component Value Date/Time   LIPASE 34 12/13/2013 0750       Studies/Results: Ct Abdomen Pelvis W Contrast  12/13/2013   CLINICAL DATA:  Acute nausea and vomiting.  EXAM: CT ABDOMEN AND PELVIS WITH CONTRAST  TECHNIQUE: Multidetector CT imaging of the abdomen and pelvis was performed using the standard protocol following bolus administration of intravenous contrast.  CONTRAST:   100mL OMNIPAQUE IOHEXOL 300 MG/ML  SOLN  COMPARISON:  None  FINDINGS: The lung bases are clear.  No pleural or pericardial effusion.  No focal liver abnormality identified. The gallbladder is normal. No biliary dilatation. Normal appearance of the pancreas. The spleen is unremarkable. The left adrenal gland appears normal. Normal appearance of the right adrenal gland. Right pelvic kidney. There are bilateral renal cysts identified. No obstructive uropathy or evidence of nephrolithiasis. The urinary bladder appears normal. Previous hysterectomy.  Calcified atherosclerotic disease involves the abdominal aorta. There is no aneurysm. No upper abdominal adenopathy identified. No pelvic or inguinal adenopathy noted.  The stomach is normal. Increase caliber of the proximal and mid small bowel loops which measure up to 3.2 cm. The distal small bowel loops are decreased to normal in caliber. Transition to decreased small bowel loops is identified within the central pelvis, image 52/series 5. No perforation, pneumatosis or abscess.  Moderate stool burden identified within the colon up to the rectum.  Review of the visualized osseous structures is significant for mild lumbar degenerative disc disease. No aggressive lytic or sclerotic bone lesions identified. There is an anterolisthesis of L3 on L4 and L4  on L5.  IMPRESSION: 1. Findings compatible with small bowel obstruction. Transition point is in the pelvis in may reflect adhesive disease. 2. Atherosclerotic disease. 3. Lumbar spondylosis. 4. Right pelvic kidney.   Electronically Signed   By: Signa Kell M.D.   On: 12/13/2013 09:48    Anti-infectives: Anti-infectives   None     Assessment/Plan Partial SBO Nausea/vomiting Stool impaction Chronic narcotic use Hysterectomy 50 years ago  Plan: 1.  Had 2 BM's overnight, cont conservative management:  IVF, pain control, bowel regimen 2.  Diet - advance to clears at breakfast and fulls at lunch 3.  Ambulate and  IS 4.  SCD's and heparin 5.  No surgical indications at this time, hopefully home tomorrow if all goes well.  Will need good bowel regimen at home.   LOS: 1 day   [name] 12/14/2013, 7:48 AM Pager: [phone]  Agree with above. In good spirits.  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  [name], MD, [facility] Pager: [phone] Office phone:  [phone]

## 2013-12-15 DIAGNOSIS — G8929 Other chronic pain: Secondary | ICD-10-CM | POA: Diagnosis not present

## 2013-12-15 DIAGNOSIS — K56609 Unspecified intestinal obstruction, unspecified as to partial versus complete obstruction: Secondary | ICD-10-CM | POA: Diagnosis not present

## 2013-12-15 DIAGNOSIS — K5649 Other impaction of intestine: Secondary | ICD-10-CM | POA: Diagnosis not present

## 2013-12-15 DIAGNOSIS — I10 Essential (primary) hypertension: Secondary | ICD-10-CM | POA: Diagnosis not present

## 2013-12-15 DIAGNOSIS — R1110 Vomiting, unspecified: Secondary | ICD-10-CM | POA: Diagnosis not present

## 2013-12-15 DIAGNOSIS — R112 Nausea with vomiting, unspecified: Secondary | ICD-10-CM | POA: Diagnosis not present

## 2013-12-15 MED ORDER — DILTIAZEM HCL ER COATED BEADS 360 MG PO CP24
360.0000 mg | ORAL_CAPSULE | Freq: Every day | ORAL | Status: DC
  Administered 2013-12-15: 360 mg via ORAL
  Filled 2013-12-15: qty 1

## 2013-12-15 MED ORDER — DSS 100 MG PO CAPS: 100.0000 mg | ORAL_CAPSULE | Freq: Every day | ORAL | Status: AC

## 2013-12-15 NOTE — Progress Notes (Signed)
I have seen and examined the patient and agree with the assessment and plans.  Zacary Bauer A. Adaora Mchaney  MD, FACS  

## 2013-12-15 NOTE — Discharge Summary (Signed)
Physician Discharge Summary  Patient ID: Claudia [name] MRN: [id number] DOB/AGE: 10-Mar-1931 80 y.o.  Admit date: 12/13/2013 Discharge date: 12/15/2013  Admission Diagnoses:  Discharge Diagnoses:  Principal Problem:   SBO (small bowel obstruction) Active Problems:   Small bowel obstruction   Vomiting   Chronic narcotic use   Chronic back pain   Hypertension   Discharged Condition: stable  Hospital Course:   Patient presented to the hospital with complaint of abdominal pain, imaging in the emergency room reveal small bowel obstruction. Admission was deemed necessary for further evaluation and treatment. Patient was made n.p.o., admitted to a medical floor bed, surgical consultation was obtained. Fortunately no surgery was indicated, patient resolved with conservative measures. Her diet was advanced, she had good bowel movement. There were no complaints of nausea vomiting no emesis while hospitalized. Patient was cleared for discharge by surgery. Of note patient has chronic pain and requires chronic narcotics, good bowel regimen is still recommended  Consults: Treatment Team:  Md Montez Moritacs, MD  Significant Diagnostic Studies:Dg Abd 1 View  12/14/2013   CLINICAL DATA:  Abdominal pain. Small bowel obstruction on recent CT.  EXAM: ABDOMEN - 1 VIEW  COMPARISON:  CT ABD/PELVIS W CM dated 12/13/2013  FINDINGS: Oral contrast has migrated into the colon. The dilated loops of small bowel in the central abdomen are no longer present. Mild scoliosis in the lumbar spine. Calcifications in the pelvis are suggestive for phleboliths.  IMPRESSION: Oral contrast in the colon and dilated loops of small bowel are no longer present. Findings suggest a resolving small bowel obstruction.   Electronically Signed   By: Richarda OverlieAdam  Henn M.D.   On: 12/14/2013 09:37   Ct Abdomen Pelvis W Contrast  12/13/2013   CLINICAL DATA:  Acute nausea and vomiting.  EXAM: CT ABDOMEN AND PELVIS WITH CONTRAST  TECHNIQUE: Multidetector CT imaging  of the abdomen and pelvis was performed using the standard protocol following bolus administration of intravenous contrast.  CONTRAST:  100mL OMNIPAQUE IOHEXOL 300 MG/ML  SOLN  COMPARISON:  None  FINDINGS: The lung bases are clear.  No pleural or pericardial effusion.  No focal liver abnormality identified. The gallbladder is normal. No biliary dilatation. Normal appearance of the pancreas. The spleen is unremarkable. The left adrenal gland appears normal. Normal appearance of the right adrenal gland. Right pelvic kidney. There are bilateral renal cysts identified. No obstructive uropathy or evidence of nephrolithiasis. The urinary bladder appears normal. Previous hysterectomy.  Calcified atherosclerotic disease involves the abdominal aorta. There is no aneurysm. No upper abdominal adenopathy identified. No pelvic or inguinal adenopathy noted.  The stomach is normal. Increase caliber of the proximal and mid small bowel loops which measure up to 3.2 cm. The distal small bowel loops are decreased to normal in caliber. Transition to decreased small bowel loops is identified within the central pelvis, image 52/series 5. No perforation, pneumatosis or abscess.  Moderate stool burden identified within the colon up to the rectum.  Review of the visualized osseous structures is significant for mild lumbar degenerative disc disease. No aggressive lytic or sclerotic bone lesions identified. There is an anterolisthesis of L3 on L4 and L4 on L5.  IMPRESSION: 1. Findings compatible with small bowel obstruction. Transition point is in the pelvis in may reflect adhesive disease. 2. Atherosclerotic disease. 3. Lumbar spondylosis. 4. Right pelvic kidney.   Electronically Signed   By: Signa Kellaylor  Stroud M.D.   On: 12/13/2013 09:48      Discharge Exam: Blood pressure 132/68, pulse  67, temperature 98.3 F (36.8 C), temperature source Oral, resp. rate 18, height 5' (1.524 m), weight 53.1 kg (117 lb 1 oz), SpO2 94.00%. General  appearance: alert and cooperative Resp: clear to auscultation bilaterally Cardio: regular rate and rhythm GI: soft, non-tender; bowel sounds normal; no masses,  no organomegaly  Disposition: Home     Medication List         diltiazem 360 MG 24 hr capsule  Commonly known as:  TIAZAC  Take 360 mg by mouth daily.     DSS 100 MG Caps  Take 100 mg by mouth daily.     esomeprazole 40 MG capsule  Commonly known as:  NEXIUM  Take 40 mg by mouth daily at 12 noon.     fentaNYL 75 MCG/HR  Commonly known as:  DURAGESIC - dosed mcg/hr  Place 75 mcg onto the skin every other day.     morphine 15 MG tablet  Commonly known as:  MSIR  Take 15 mg by mouth every 8 (eight) hours as needed for severe pain.     nitroGLYCERIN 0.4 MG SL tablet  Commonly known as:  NITROSTAT  Place 0.4 mg under the tongue every 5 (five) minutes as needed for chest pain.     pantoprazole 40 MG tablet  Commonly known as:  PROTONIX  Take 40 mg by mouth daily.     simvastatin 20 MG tablet  Commonly known as:  ZOCOR  Take 20 mg by mouth every evening.     triamterene-hydrochlorothiazide 37.5-25 MG per capsule  Commonly known as:  DYAZIDE  Take 1 capsule by mouth daily.           Follow-up Information   Follow up with Filimon Miranda D, MD In 1 week.   Specialty:  Internal Medicine   Contact information:   [address] [place] [address] [phone]       Signed: Katy Apo 12/15/2013, 12:45 PM

## 2013-12-15 NOTE — Progress Notes (Signed)
Subjective: Pt feels great.  Wants to get home to her autistic adult daughter.  No N/V or abdominal pain.  Having BM's and flatus.  Tolerated fulls well although appetite low.  No other complaints.  Objective: Vital signs in last 24 hours: Temp:  [97.5 F (36.4 C)-98.3 F (36.8 C)] 98.3 F (36.8 C) (03/09 0434) Pulse Rate:  [63-67] 67 (03/09 0434) Resp:  [18] 18 (03/09 0434) BP: (114-131)/(57-70) 128/63 mmHg (03/09 0434) SpO2:  [94 %-98 %] 94 % (03/09 0434) Last BM Date: 12/14/13  Intake/Output from previous day:   Intake/Output this shift:    PE: Gen:  Alert, NAD, pleasant Abd: Soft, NT/ND, +BS, no HSM   Lab Results:   Recent Labs  12/13/13 0750 12/14/13 0625  WBC 9.5 5.5  HGB 13.3 11.8*  HCT 38.9 35.3*  PLT 230 206   BMET  Recent Labs  12/13/13 0750 12/14/13 0625  NA 140 143  K 3.2* 3.5*  CL 96 105  CO2 29 27  GLUCOSE 121* 86  BUN 13 13  CREATININE 0.75 0.78  CALCIUM 9.9 8.5   PT/INR No results found for this basename: LABPROT, INR,  in the last 72 hours CMP     Component Value Date/Time   NA 143 12/14/2013 0625   K 3.5* 12/14/2013 0625   CL 105 12/14/2013 0625   CO2 27 12/14/2013 0625   GLUCOSE 86 12/14/2013 0625   BUN 13 12/14/2013 0625   CREATININE 0.78 12/14/2013 0625   CALCIUM 8.5 12/14/2013 0625   PROT 7.3 12/13/2013 0750   ALBUMIN 4.1 12/13/2013 0750   AST 19 12/13/2013 0750   ALT 15 12/13/2013 0750   ALKPHOS 78 12/13/2013 0750   BILITOT 0.3 12/13/2013 0750   GFRNONAA 76* 12/14/2013 0625   GFRAA 88* 12/14/2013 0625   Lipase     Component Value Date/Time   LIPASE 34 12/13/2013 0750       Studies/Results: Dg Abd 1 View  12/14/2013   CLINICAL DATA:  Abdominal pain. Small bowel obstruction on recent CT.  EXAM: ABDOMEN - 1 VIEW  COMPARISON:  CT ABD/PELVIS W CM dated 12/13/2013  FINDINGS: Oral contrast has migrated into the colon. The dilated loops of small bowel in the central abdomen are no longer present. Mild scoliosis in the lumbar spine. Calcifications in  the pelvis are suggestive for phleboliths.  IMPRESSION: Oral contrast in the colon and dilated loops of small bowel are no longer present. Findings suggest a resolving small bowel obstruction.   Electronically Signed   By: Richarda OverlieAdam  Henn M.D.   On: 12/14/2013 09:37   Ct Abdomen Pelvis W Contrast  12/13/2013   CLINICAL DATA:  Acute nausea and vomiting.  EXAM: CT ABDOMEN AND PELVIS WITH CONTRAST  TECHNIQUE: Multidetector CT imaging of the abdomen and pelvis was performed using the standard protocol following bolus administration of intravenous contrast.  CONTRAST:  100mL OMNIPAQUE IOHEXOL 300 MG/ML  SOLN  COMPARISON:  None  FINDINGS: The lung bases are clear.  No pleural or pericardial effusion.  No focal liver abnormality identified. The gallbladder is normal. No biliary dilatation. Normal appearance of the pancreas. The spleen is unremarkable. The left adrenal gland appears normal. Normal appearance of the right adrenal gland. Right pelvic kidney. There are bilateral renal cysts identified. No obstructive uropathy or evidence of nephrolithiasis. The urinary bladder appears normal. Previous hysterectomy.  Calcified atherosclerotic disease involves the abdominal aorta. There is no aneurysm. No upper abdominal adenopathy identified. No pelvic or inguinal adenopathy noted.  The stomach is normal. Increase caliber of the proximal and mid small bowel loops which measure up to 3.2 cm. The distal small bowel loops are decreased to normal in caliber. Transition to decreased small bowel loops is identified within the central pelvis, image 52/series 5. No perforation, pneumatosis or abscess.  Moderate stool burden identified within the colon up to the rectum.  Review of the visualized osseous structures is significant for mild lumbar degenerative disc disease. No aggressive lytic or sclerotic bone lesions identified. There is an anterolisthesis of L3 on L4 and L4 on L5.  IMPRESSION: 1. Findings compatible with small bowel  obstruction. Transition point is in the pelvis in may reflect adhesive disease. 2. Atherosclerotic disease. 3. Lumbar spondylosis. 4. Right pelvic kidney.   Electronically Signed   By: Signa Kell M.D.   On: 12/13/2013 09:48    Anti-infectives: Anti-infectives   None       Assessment/Plan Partial SBO  Nausea/vomiting  Stool impaction  Chronic narcotic use  Hysterectomy 50 years ago   Plan:  1. Conservative management seems to be successful 2. Diet - advance to soft diet 3. Ambulate and IS  4. SCD's and heparin  5. No surgical indications at this time, home today if tolerating solid diet.  Will need good bowel regimen at home.     LOS: 2 days    Claudia [name], Claudia [name] 12/15/2013, 8:30 AM Pager: [phone]

## 2013-12-15 NOTE — Progress Notes (Signed)
Utilization review completed.  

## 2013-12-15 NOTE — Plan of Care (Signed)
Problem: Food- and Nutrition-Related Knowledge Deficit (NB-1.1) Goal: Nutrition education Formal process to instruct or train a patient/client in a skill or to impart knowledge to help patients/clients voluntarily manage or modify food choices and eating behavior to maintain or improve health. Outcome: Completed/Met Date Met:  12/15/13  Nutrition Education Consult  RD consulted for nutrition education regarding chronic constipation. RD provided "High Fiber Nutrition Therapy" handout from the [employer]. Discussed different food groups and their fiber content, emphasizing gradual addition of higher fiber-containing foods as symptoms resolve. Provided list of goods to eat and foods to avoid. Teach back method used.  Expect good compliance.  Body mass index is 22.86 kg/(m^2). Pt meets criteria for normal weight based on current BMI.  Current diet order is Soft Diet, patient is consuming approximately 50% of meals at this time. Labs and medications reviewed. No further nutrition interventions warranted at this time. RD contact information provided. If additional nutrition issues arise, please re-consult RD.  Inda Coke MS, RD, LDN Pager: [phone] After-hours pager: [phone]

## 2013-12-18 DIAGNOSIS — G894 Chronic pain syndrome: Secondary | ICD-10-CM | POA: Diagnosis not present

## 2013-12-18 DIAGNOSIS — M48061 Spinal stenosis, lumbar region without neurogenic claudication: Secondary | ICD-10-CM | POA: Diagnosis not present

## 2013-12-18 DIAGNOSIS — M47817 Spondylosis without myelopathy or radiculopathy, lumbosacral region: Secondary | ICD-10-CM | POA: Diagnosis not present

## 2013-12-18 DIAGNOSIS — M47812 Spondylosis without myelopathy or radiculopathy, cervical region: Secondary | ICD-10-CM | POA: Diagnosis not present

## 2013-12-26 DIAGNOSIS — K56609 Unspecified intestinal obstruction, unspecified as to partial versus complete obstruction: Secondary | ICD-10-CM | POA: Diagnosis not present

## 2014-01-21 DIAGNOSIS — M47817 Spondylosis without myelopathy or radiculopathy, lumbosacral region: Secondary | ICD-10-CM | POA: Diagnosis not present

## 2014-01-21 DIAGNOSIS — G894 Chronic pain syndrome: Secondary | ICD-10-CM | POA: Diagnosis not present

## 2014-01-21 DIAGNOSIS — M48061 Spinal stenosis, lumbar region without neurogenic claudication: Secondary | ICD-10-CM | POA: Diagnosis not present

## 2014-01-21 DIAGNOSIS — M47812 Spondylosis without myelopathy or radiculopathy, cervical region: Secondary | ICD-10-CM | POA: Diagnosis not present

## 2014-02-06 ENCOUNTER — Ambulatory Visit
Admission: RE | Admit: 2014-02-06 | Discharge: 2014-02-06 | Disposition: A | Discharge status: Home / Self Care | Payer: Medicare Other | Source: Ambulatory Visit | Attending: Nurse Practitioner | Admitting: Nurse Practitioner

## 2014-02-06 ENCOUNTER — Encounter (INDEPENDENT_AMBULATORY_CARE_PROVIDER_SITE_OTHER): Payer: Self-pay

## 2014-02-06 ENCOUNTER — Other Ambulatory Visit: Payer: Self-pay | Admitting: Internal Medicine

## 2014-02-06 ENCOUNTER — Other Ambulatory Visit: Payer: Self-pay | Admitting: Nurse Practitioner

## 2014-02-06 DIAGNOSIS — K56609 Unspecified intestinal obstruction, unspecified as to partial versus complete obstruction: Secondary | ICD-10-CM

## 2014-02-06 DIAGNOSIS — R109 Unspecified abdominal pain: Secondary | ICD-10-CM

## 2014-02-06 DIAGNOSIS — K5900 Constipation, unspecified: Secondary | ICD-10-CM | POA: Diagnosis not present

## 2014-02-09 DIAGNOSIS — K5900 Constipation, unspecified: Secondary | ICD-10-CM | POA: Diagnosis not present

## 2014-02-09 DIAGNOSIS — R109 Unspecified abdominal pain: Secondary | ICD-10-CM | POA: Diagnosis not present

## 2014-02-23 DIAGNOSIS — G894 Chronic pain syndrome: Secondary | ICD-10-CM | POA: Diagnosis not present

## 2014-02-23 DIAGNOSIS — M47817 Spondylosis without myelopathy or radiculopathy, lumbosacral region: Secondary | ICD-10-CM | POA: Diagnosis not present

## 2014-02-23 DIAGNOSIS — M47812 Spondylosis without myelopathy or radiculopathy, cervical region: Secondary | ICD-10-CM | POA: Diagnosis not present

## 2014-02-23 DIAGNOSIS — M48061 Spinal stenosis, lumbar region without neurogenic claudication: Secondary | ICD-10-CM | POA: Diagnosis not present

## 2014-02-24 DIAGNOSIS — E782 Mixed hyperlipidemia: Secondary | ICD-10-CM | POA: Diagnosis not present

## 2014-02-24 DIAGNOSIS — K5900 Constipation, unspecified: Secondary | ICD-10-CM | POA: Diagnosis not present

## 2014-02-24 DIAGNOSIS — I10 Essential (primary) hypertension: Secondary | ICD-10-CM | POA: Diagnosis not present

## 2014-03-25 DIAGNOSIS — M47817 Spondylosis without myelopathy or radiculopathy, lumbosacral region: Secondary | ICD-10-CM | POA: Diagnosis not present

## 2014-03-25 DIAGNOSIS — G894 Chronic pain syndrome: Secondary | ICD-10-CM | POA: Diagnosis not present

## 2014-03-25 DIAGNOSIS — M48061 Spinal stenosis, lumbar region without neurogenic claudication: Secondary | ICD-10-CM | POA: Diagnosis not present

## 2014-03-25 DIAGNOSIS — M47812 Spondylosis without myelopathy or radiculopathy, cervical region: Secondary | ICD-10-CM | POA: Diagnosis not present

## 2014-03-30 ENCOUNTER — Encounter (HOSPITAL_COMMUNITY): Payer: Self-pay | Admitting: Emergency Medicine

## 2014-03-30 ENCOUNTER — Emergency Department (HOSPITAL_COMMUNITY)
Admission: EM | Admit: 2014-03-30 | Discharge: 2014-03-30 | Disposition: A | Discharge status: Home / Self Care | Payer: Medicare Other | Attending: Emergency Medicine | Admitting: Emergency Medicine

## 2014-03-30 DIAGNOSIS — H269 Unspecified cataract: Secondary | ICD-10-CM | POA: Diagnosis not present

## 2014-03-30 DIAGNOSIS — M545 Low back pain, unspecified: Secondary | ICD-10-CM | POA: Insufficient documentation

## 2014-03-30 DIAGNOSIS — M549 Dorsalgia, unspecified: Secondary | ICD-10-CM

## 2014-03-30 DIAGNOSIS — K219 Gastro-esophageal reflux disease without esophagitis: Secondary | ICD-10-CM | POA: Diagnosis not present

## 2014-03-30 DIAGNOSIS — G8929 Other chronic pain: Secondary | ICD-10-CM

## 2014-03-30 DIAGNOSIS — M810 Age-related osteoporosis without current pathological fracture: Secondary | ICD-10-CM | POA: Insufficient documentation

## 2014-03-30 DIAGNOSIS — Z79899 Other long term (current) drug therapy: Secondary | ICD-10-CM | POA: Diagnosis not present

## 2014-03-30 DIAGNOSIS — I10 Essential (primary) hypertension: Secondary | ICD-10-CM | POA: Diagnosis not present

## 2014-03-30 DIAGNOSIS — I2510 Atherosclerotic heart disease of native coronary artery without angina pectoris: Secondary | ICD-10-CM | POA: Diagnosis not present

## 2014-03-30 DIAGNOSIS — E785 Hyperlipidemia, unspecified: Secondary | ICD-10-CM | POA: Insufficient documentation

## 2014-03-30 HISTORY — DX: Pain, unspecified: R52

## 2014-03-30 HISTORY — DX: Opioid use, unspecified, uncomplicated: F11.90

## 2014-03-30 MED ORDER — HYDROMORPHONE HCL PF 1 MG/ML IJ SOLN
0.5000 mg | Freq: Once | INTRAMUSCULAR | Status: AC
  Administered 2014-03-30: 0.5 mg via INTRAMUSCULAR
  Filled 2014-03-30: qty 1

## 2014-03-30 NOTE — ED Provider Notes (Signed)
CSN: [id number]     Arrival date & time 03/30/14  0502 History   First MD Initiated Contact with Patient 03/30/14 0522     Chief Complaint  Patient presents with  . Back Pain      HPI Pt was seen at 0530. Per pt, c/o gradual onset and persistence of constant acute flair of her chronic low back "pain" for the past 1 week.  Pt states her pain began after her Pain Management doctor "changed the doses of my pain medicines" last week. Pt states she is due to pick up another pain medication prescription at the pharmacy this week (she cannot recall the name of it).  Denies any change in her usual chronic pain pattern.  Pain worsens with palpation of the area and body position changes. Denies incont/retention of bowel or bladder, no saddle anesthesia, no focal motor weakness, no tingling/numbness in extremities, no fevers, no injury, no abd pain.   The symptoms have been associated with no other complaints. The patient has a significant history of similar symptoms previously, recently being evaluated for this complaint and multiple prior evals for same.     Past Medical History  Diagnosis Date  . Back pain   . Hypertension   . Reflux   . Coronary artery disease   . Hyperlipidemia   . Osteoarthritis   . Mitral valve prolapse   . Chronic pain   . Osteoporosis   . Cataracts, both eyes   . Pain management   . Chronic narcotic use    Past Surgical History  Procedure Laterality Date  . Appendectomy      History  Substance Use Topics  . Smoking status: Never Smoker   . Smokeless tobacco: Not on file  . Alcohol Use: No    Review of Systems ROS: Statement: All systems negative except as marked or noted in the HPI; Constitutional: Negative for fever and chills. ; ; Eyes: Negative for eye pain, redness and discharge. ; ; ENMT: Negative for ear pain, hoarseness, nasal congestion, sinus pressure and sore throat. ; ; Cardiovascular: Negative for chest pain, palpitations, diaphoresis, dyspnea and  peripheral edema. ; ; Respiratory: Negative for cough, wheezing and stridor. ; ; Gastrointestinal: Negative for nausea, vomiting, diarrhea, abdominal pain, blood in stool, hematemesis, jaundice and rectal bleeding. . ; ; Genitourinary: Negative for dysuria, flank pain and hematuria. ; ; Musculoskeletal: +LBP. Negative for neck pain. Negative for swelling and trauma.; ; Skin: Negative for pruritus, rash, abrasions, blisters, bruising and skin lesion.; ; Neuro: Negative for headache, lightheadedness and neck stiffness. Negative for weakness, altered level of consciousness , altered mental status, extremity weakness, paresthesias, involuntary movement, seizure and syncope.        Allergies  Aspirin; Benadryl; Ibuprofen; and Sulfa antibiotics  Home Medications   Prior to Admission medications   Medication Sig Start Date End Date Taking? Authorizing Shakiera Edelson  calcium carbonate 200 MG capsule Take 250 mg by mouth 2 (two) times daily with a meal.   Yes Historical Roselyne Stalnaker, MD  diltiazem (TIAZAC) 360 MG 24 hr capsule Take 360 mg by mouth daily.   Yes Historical Labarron Durnin, MD  docusate sodium 100 MG CAPS Take 100 mg by mouth daily. 12/15/13  Yes Katy Apoonald D Polite, MD  esomeprazole (NEXIUM) 40 MG capsule Take 40 mg by mouth daily at 12 noon.   Yes Historical Aletheia Tangredi, MD  fentaNYL (DURAGESIC - DOSED MCG/HR) 50 MCG/HR Place 50 mcg onto the skin every 3 (three) days.   Yes Historical  Liberato Stansbery, MD  Oxycodone HCl 10 MG TABS Take 10 mg by mouth every 4 (four) hours as needed (for breakthrough pain).   Yes Historical Maronda Caison, MD  polyethylene glycol (MIRALAX / GLYCOLAX) packet Take 17 g by mouth daily.   Yes Historical Yukio Bisping, MD  simvastatin (ZOCOR) 20 MG tablet Take 20 mg by mouth every evening.   Yes Historical Tahiri Shareef, MD  triamterene-hydrochlorothiazide (DYAZIDE) 37.5-25 MG per capsule Take 1 capsule by mouth daily.   Yes Historical Christena Sunderlin, MD  nitroGLYCERIN (NITROSTAT) 0.4 MG SL tablet Place 0.4 mg  under the tongue every 5 (five) minutes as needed for chest pain.    Historical Rhina Kramme, MD   BP 142/66  Pulse 73  Temp(Src) 98.1 F (36.7 C) (Oral)  Resp 13  Ht 5\' 1"  (1.549 m)  Wt 112 lb (50.803 kg)  BMI 21.17 kg/m2  SpO2 99% Physical Exam 0535: Physical examination:  Nursing notes reviewed; Vital signs and O2 SAT reviewed;  Constitutional: Well developed, Well nourished, Well hydrated, In no acute distress; Head:  Normocephalic, atraumatic; Eyes: EOMI, PERRL, No scleral icterus; ENMT: Mouth and pharynx normal, Mucous membranes moist; Neck: Supple, Full range of motion, No lymphadenopathy; Cardiovascular: Regular rate and rhythm, No gallop; Respiratory: Breath sounds clear & equal bilaterally, No rales, rhonchi, wheezes.  Speaking full sentences with ease, Normal respiratory effort/excursion; Chest: Nontender, Movement normal; Abdomen: Soft, Nontender, Nondistended, Normal bowel sounds; Genitourinary: No CVA tenderness; Spine:  No midline CS, TS, LS tenderness. +TTP lumbar paraspinal muscles.;; Extremities: Pulses normal, No tenderness, No edema, No calf edema or asymmetry.; Neuro: AA&Ox3, Major CN grossly intact.  Speech clear. No gross focal motor or sensory deficits in extremities.; Skin: Color normal, Warm, Dry.   ED Course  Procedures     MDM  MDM Reviewed: previous chart, nursing note and vitals    0615:  Long hx of chronic pain with multiple ED visits for same.  Pt endorses acute flair of her usual long standing chronic pain today, no change from her usual chronic pain pattern.  Pt encouraged to f/u with her Pain Management doctor for good continuity of care and control of her chronic pain.  Verb understanding.     Laray AngerKathleen M McManus, DO 04/01/14 1742

## 2014-03-30 NOTE — Discharge Instructions (Signed)
°Emergency Department Resource Guide °1) Find a Doctor and Pay Out of Pocket °Although you won't have to find out who is covered by your insurance plan, it is a good idea to ask around and get recommendations. You will then need to call the office and see if the doctor you have chosen will accept you as a new patient and what types of options they offer for patients who are self-pay. Some doctors offer discounts or will set up payment plans for their patients who do not have insurance, but you will need to ask so you aren't surprised when you get to your appointment. ° °2) Contact Your Local Health Department °Not all health departments have doctors that can see patients for sick visits, but many do, so it is worth a call to see if yours does. If you don't know where your local health department is, you can check in your phone book. The CDC also has a tool to help you locate your state's health department, and many state websites also have listings of all of their local health departments. ° °3) Find a Walk-in Clinic °If your illness is not likely to be very severe or complicated, you may want to try a walk in clinic. These are popping up all over the country in pharmacies, drugstores, and shopping centers. They're usually staffed by nurse practitioners or physician assistants that have been trained to treat common illnesses and complaints. They're usually fairly quick and inexpensive. However, if you have serious medical issues or chronic medical problems, these are probably not your best option. ° °No Primary Care Doctor: °- Call [facility] at  832-8000 - they can help you locate a primary care doctor that  accepts your insurance, provides certain services, etc. °- Physician Referral Service- 1-800-533-3463 ° °Chronic Pain Problems: °Organization         Address  Phone   Notes  °[facility]  (336) 297-2271 Patients need to be referred by their primary care doctor.  ° °Medication  Assistance: °Organization         Address  Phone   Notes  °[place] [facility] [address] °[place], [place] [address] (336) 641-8030 --Must be a resident of [place] °-- Must have NO insurance coverage whatsoever (no Medicaid/ Medicare, etc.) °-- The pt. MUST have a primary care doctor that directs their care regularly and follows them in the community °  °[facility]  (866) 331-1348   °[employer]  (888) 892-1162   ° °Agencies that provide inexpensive medical care: °Organization         Address  Phone   Notes  °[facility]  (336) 832-8035   °[facility]    (336) 832-7272   °[facility] [address] °[place], [place] [address] (336) 832-4777   °[facility] [address], °[place] (336) 271-4999   °[facility]    (336) 373-0678   °[facility]    (336) 272-1050   °[facility] ° [address], [place] Phone:  (336) 832-4444, Fax:  (336) 832-4440 Hours of Operation:  9 am - 6 pm, M-F.  Also accepts Medicaid/Medicare and self-pay.  °[facility] ° [address], [place] Phone: (336) 832-3150, Fax: (336) [phone]. Hours of Operation:  8:30 am - 5:30 pm, M-F.  Also accepts Medicaid and self-pay.  °[facility] [place] [address], [place] Phone: (336) 878-6027   °[facility] [address], [place], [place] (336)723-1848, Ext. 123 Mondays & Thursdays: 7-9 AM.  First 15 patients are seen on a first come, first serve basis. °  ° °Medicaid-accepting [place] Providers: ° °Organization         Address  Phone   Notes  °[facility] [address], [place] (336) 641-2100 Also accepts self-pay patients.  °[facility] [address], [place] ° (336) 856-9996   °[facility] [address], [place]  (336) 288-8857   °[facility] [address] [place] [address], [place] (336) 299-7000   °Veita Bland [address], [place]  ° (336) 373-1557 Only accepts [place] Access Medicaid patients after they have their name applied to their card.  ° °Self-Pay (no insurance) in [place]: ° °Organization         Address  Phone   Notes  °Sickle Cell Patients, [facility] [address], [place] (336) 832-1970   °[facility] [address], [place] (336) 832-4400   °[facility] ° [address], [place] (336) 992-4800   °[facility]/Dr. Osei-Bonsu ° [address] [place] [address], [place] or [address], [place] (336) 841-8500 Phone number for both [place] and [place] locations is the same.  °[facility] [address], [place] (336) 299-0000   °[facility] [address] [place] [address], [place] or [address] (336) 852-7530 °(336) 878-2260   °[facility] [address], [place] (336) 350-1642, phone; (336) 294-5005, fax Sees patients 1st and 3rd Saturday of every month.  Must not qualify for public or private insurance (i.e. Medicaid, Medicare, [place] Health Choice, Veterans' Benefits) • Household income should be no more than 200% of the poverty level •The clinic cannot treat you if you are pregnant or think you are pregnant • Sexually transmitted diseases are not treated at the clinic.  ° ° °Dental Care: °Organization         Address  Phone  Notes  °[place] [facility] [address], [place] (336) 641-6152 Accepts children up to age 21 who are enrolled in Medicaid or [place] Health Choice; pregnant women with a Medicaid card; and children who have applied for Medicaid or [place] Health Choice, but were declined, whose parents can pay a reduced fee at time of service.  °[place]  [facility] [place]  [address], [place] (336) 641-7733 Accepts children up to age 21 who are enrolled in Medicaid or [place] Health Choice; pregnant women with a Medicaid card; and children who have applied for Medicaid or [place] Health Choice, but were declined, whose parents can pay a reduced fee at time of service.  °[facility] ° [address], [place] (336) 641-4533 Patients are seen by appointment only. Walk-ins are not accepted. [facility] will see patients 18 years of age and older. °Monday - Tuesday (8am-5pm) °Most Wednesdays (8:30-5pm) °$30 per visit, cash only  °[facility] ° [address], [place] (336) 641-4533 Patients are seen by appointment only. Walk-ins are not accepted. [facility] will see patients 18 years of age and older. °One   Wednesday Evening (Monthly: Volunteer Based).  $30 per visit, cash only  °[facility]  (919) 537-3737 for adults; Children under age 4, call Graduate Pediatric Dentistry at ([phone]) [phone]. Children aged 4-14, please call (919) [phone] to request a pediatric application. ° Dental services are provided in all areas of dental care including fillings, crowns and bridges, complete and partial dentures, implants, gum treatment, root canals, and extractions. Preventive care is also provided. Treatment is provided to both adults and children. °Patients are selected via a lottery and there is often a waiting list. °  °[facility] [address], °[place] ° (336) 763-8833 [email or web address] °  °[facility] [address], [place], [place] (336)723-1848, Ext. 123 Second and Fourth Thursday of each month, opens at 6:30 AM; Clinic ends at 9 AM.  Patients are seen on a first-come first-served basis, and a limited number are seen during each clinic.  ° °[facility] ° [address], [place], [place] (336) 723-7904    Eligibility Requirements °You must have lived in [place], [place], or [place] counties for at least the last three months. °  You cannot be eligible for state or federal sponsored healthcare insurance, including Veterans Administration, Medicaid, or Medicare. °  You generally cannot be eligible for healthcare insurance through your employer.  °  How to apply: °Eligibility screenings are held every Tuesday and Wednesday afternoon from 1:00 pm until 4:00 pm. You do not need an appointment for the interview!  °[facility] [address], [place], [place] 336-631-2330   °[facility]  336-342-8273   °[facility]  336-703-3100   °[facility]  336-570-6415   ° °Behavioral Health Resources in the Community: °Intensive Outpatient Programs °Organization         Address  Phone  Notes  °[facility] [address], [place], [place] 336-878-6098   °[facility] [address], [place], [place] 336-832-9800   °[facility] [address], [place], [place] ° 336-882-2125   °[facility] [address],  °[place], [place] 1-800-853-5163 or 336-641-4981   °Substance Abuse Resources °Organization         Address  Phone  Notes  °[facility]  336-882-2125   °[facility]  336-784-9470   °[facility]  336-285-9073   °[facility]  336-845-3988   °Residential & Outpatient Substance Abuse Program  1-800-659-3381   °Psychological Services °Organization         Address  Phone  Notes  °[facility]  336- 832-9600   °[facility]  336- 378-7881   °[facility] [address], [place] 1-800-853-5163 or 336-641-4981   ° °Mobile Crisis Teams °Organization         Address  Phone  Notes  °[facility], Mobile Crisis Care Unit  1-877-626-1772   °Assertive °[facility] ° [address]  [place], [place] 336-834-9664   °Sharon DeEsch [address] °[place] 336-554-5454   ° °Self-Help/Support Groups °Organization         Address  Phone             Notes  °[facility]  - variety of support groups  336- 373-1402 Call for more information  °Narcotics Anonymous (NA), [facility] [address], °[place]  2 meetings at this location  ° °  Residential Treatment Programs Organization         Address  Phone  Notes  [facility] [address],    [place]  [phone]   [facility]  [address], [place] [phone], [place], [place] [phone]   [facility] [address], [place] [phone] Admissions: 8am-3pm M-F  [facility] [address]    [place], [place] [phone]   [facility] [address], [place], [place] [phone]   [facility] [address]  [place], [place] [phone]) [phone]   [facility] Intensive Outpatient [address], [place], [place] [phone]   [facility] ([facility] [address]  [place], [place] [phone] or [phone]   [facility] ([facility] [address] [place], [place] 323-557-3220(704)707-9069 Accepts Medicaid  [facility] [address]  [place] [phone] Substance Abuse/Addiction Treatment   [facility]         Address  Phone  Notes  [facility]  973-506-9895(888) (365) [phone]   Angie FavaJulie Brannon, PhD [address] [place], [place]   [phone](336) [phone] or ([phone](336) [phone]   [facility]   [address] [place], [place] [phone](336) [phone]   [facility] [address], [place], [place] [phone](336) [phone] Insurance/Medicaid/sponsorship through [facility] [address]                                    [place], [place] [phone](336) [phone] Therapy/tele-psych/case    [facility] [address]   [place], [place] [phone](336) [phone]    Dr. Lolly MustacheArfeen  [phone](336) [phone]   [facility]  [employer] [facility] 1) [address], [place] 2) [address], [place] 3)  [address], [place] [phone](336) [phone](336) [phone]  [phone](336) [phone]   [facility] Child Abuse Hotline 657-614-2385(336) 236-596-2180 or [phone](336) [phone] (After Hours)       Take your usual prescriptions as previously directed.  Pick up your new prescription at the pharmacy this week as scheduled. Apply moist heat or ice to the area(s) of discomfort, for 15 minutes at a time, several times per day for the next few days.  Do not fall asleep on a heating or ice pack.  Call your regular medical doctor and your Pain Management doctor today to schedule a follow up appointment in the next 2 days.  Return to the Emergency Department immediately if worsening.

## 2014-03-30 NOTE — ED Notes (Signed)
The pt has chronic back pain .  Her meds were changed  The 17th and they are not  Stopping her pain

## 2014-03-30 NOTE — ED Notes (Signed)
Pt reports she has recently had the dose on her fentanyl patch changed and has not been getting good pain relief since this occurred.  Goes to a pain clinic.  Physician into room to discuss pt care and pain options.

## 2014-03-31 DIAGNOSIS — G8929 Other chronic pain: Secondary | ICD-10-CM | POA: Diagnosis not present

## 2014-04-06 DIAGNOSIS — K5900 Constipation, unspecified: Secondary | ICD-10-CM | POA: Diagnosis not present

## 2014-04-06 DIAGNOSIS — M48061 Spinal stenosis, lumbar region without neurogenic claudication: Secondary | ICD-10-CM | POA: Diagnosis not present

## 2014-04-06 DIAGNOSIS — M47817 Spondylosis without myelopathy or radiculopathy, lumbosacral region: Secondary | ICD-10-CM | POA: Diagnosis not present

## 2014-04-06 DIAGNOSIS — G894 Chronic pain syndrome: Secondary | ICD-10-CM | POA: Diagnosis not present

## 2014-05-06 DIAGNOSIS — K5900 Constipation, unspecified: Secondary | ICD-10-CM | POA: Diagnosis not present

## 2014-05-06 DIAGNOSIS — Z79899 Other long term (current) drug therapy: Secondary | ICD-10-CM | POA: Diagnosis not present

## 2014-05-06 DIAGNOSIS — G894 Chronic pain syndrome: Secondary | ICD-10-CM | POA: Diagnosis not present

## 2014-05-06 DIAGNOSIS — M47817 Spondylosis without myelopathy or radiculopathy, lumbosacral region: Secondary | ICD-10-CM | POA: Diagnosis not present

## 2014-05-06 DIAGNOSIS — M48061 Spinal stenosis, lumbar region without neurogenic claudication: Secondary | ICD-10-CM | POA: Diagnosis not present

## 2014-06-04 DIAGNOSIS — G894 Chronic pain syndrome: Secondary | ICD-10-CM | POA: Diagnosis not present

## 2014-06-04 DIAGNOSIS — K5900 Constipation, unspecified: Secondary | ICD-10-CM | POA: Diagnosis not present

## 2014-06-04 DIAGNOSIS — M48061 Spinal stenosis, lumbar region without neurogenic claudication: Secondary | ICD-10-CM | POA: Diagnosis not present

## 2014-06-04 DIAGNOSIS — M47817 Spondylosis without myelopathy or radiculopathy, lumbosacral region: Secondary | ICD-10-CM | POA: Diagnosis not present

## 2014-07-06 DIAGNOSIS — K5900 Constipation, unspecified: Secondary | ICD-10-CM | POA: Diagnosis not present

## 2014-07-06 DIAGNOSIS — M47817 Spondylosis without myelopathy or radiculopathy, lumbosacral region: Secondary | ICD-10-CM | POA: Diagnosis not present

## 2014-07-06 DIAGNOSIS — G894 Chronic pain syndrome: Secondary | ICD-10-CM | POA: Diagnosis not present

## 2014-07-06 DIAGNOSIS — M48061 Spinal stenosis, lumbar region without neurogenic claudication: Secondary | ICD-10-CM | POA: Diagnosis not present

## 2014-08-03 DIAGNOSIS — M47816 Spondylosis without myelopathy or radiculopathy, lumbar region: Secondary | ICD-10-CM | POA: Diagnosis not present

## 2014-08-03 DIAGNOSIS — M47812 Spondylosis without myelopathy or radiculopathy, cervical region: Secondary | ICD-10-CM | POA: Diagnosis not present

## 2014-08-03 DIAGNOSIS — G894 Chronic pain syndrome: Secondary | ICD-10-CM | POA: Diagnosis not present

## 2014-08-03 DIAGNOSIS — M4806 Spinal stenosis, lumbar region: Secondary | ICD-10-CM | POA: Diagnosis not present

## 2014-08-13 ENCOUNTER — Encounter (HOSPITAL_COMMUNITY): Payer: Self-pay | Admitting: Emergency Medicine

## 2014-08-13 ENCOUNTER — Emergency Department (HOSPITAL_COMMUNITY)
Admission: EM | Admit: 2014-08-13 | Discharge: 2014-08-13 | Disposition: A | Discharge status: Home / Self Care | Payer: Medicare Other | Attending: Emergency Medicine | Admitting: Emergency Medicine

## 2014-08-13 ENCOUNTER — Emergency Department (HOSPITAL_COMMUNITY): Payer: Medicare Other

## 2014-08-13 DIAGNOSIS — Z9049 Acquired absence of other specified parts of digestive tract: Secondary | ICD-10-CM | POA: Insufficient documentation

## 2014-08-13 DIAGNOSIS — R1013 Epigastric pain: Secondary | ICD-10-CM | POA: Insufficient documentation

## 2014-08-13 DIAGNOSIS — I10 Essential (primary) hypertension: Secondary | ICD-10-CM | POA: Insufficient documentation

## 2014-08-13 DIAGNOSIS — R079 Chest pain, unspecified: Secondary | ICD-10-CM | POA: Diagnosis not present

## 2014-08-13 DIAGNOSIS — E785 Hyperlipidemia, unspecified: Secondary | ICD-10-CM | POA: Diagnosis not present

## 2014-08-13 DIAGNOSIS — R112 Nausea with vomiting, unspecified: Secondary | ICD-10-CM | POA: Diagnosis not present

## 2014-08-13 DIAGNOSIS — R42 Dizziness and giddiness: Secondary | ICD-10-CM | POA: Diagnosis not present

## 2014-08-13 DIAGNOSIS — Z79899 Other long term (current) drug therapy: Secondary | ICD-10-CM | POA: Insufficient documentation

## 2014-08-13 DIAGNOSIS — G8929 Other chronic pain: Secondary | ICD-10-CM | POA: Insufficient documentation

## 2014-08-13 DIAGNOSIS — M810 Age-related osteoporosis without current pathological fracture: Secondary | ICD-10-CM | POA: Insufficient documentation

## 2014-08-13 DIAGNOSIS — I2510 Atherosclerotic heart disease of native coronary artery without angina pectoris: Secondary | ICD-10-CM | POA: Insufficient documentation

## 2014-08-13 DIAGNOSIS — R197 Diarrhea, unspecified: Secondary | ICD-10-CM | POA: Diagnosis not present

## 2014-08-13 DIAGNOSIS — R404 Transient alteration of awareness: Secondary | ICD-10-CM | POA: Diagnosis not present

## 2014-08-13 DIAGNOSIS — R109 Unspecified abdominal pain: Secondary | ICD-10-CM

## 2014-08-13 DIAGNOSIS — R1110 Vomiting, unspecified: Secondary | ICD-10-CM

## 2014-08-13 LAB — COMPREHENSIVE METABOLIC PANEL
ALT: 14 U/L (ref 0–35)
ANION GAP: 12 (ref 5–15)
AST: 17 U/L (ref 0–37)
Albumin: 3.8 g/dL (ref 3.5–5.2)
Alkaline Phosphatase: 74 U/L (ref 39–117)
BILIRUBIN TOTAL: 0.3 mg/dL (ref 0.3–1.2)
BUN: 11 mg/dL (ref 6–23)
CHLORIDE: 99 meq/L (ref 96–112)
CO2: 28 mEq/L (ref 19–32)
CREATININE: 1.03 mg/dL (ref 0.50–1.10)
Calcium: 9.4 mg/dL (ref 8.4–10.5)
GFR calc Af Amer: 57 mL/min — ABNORMAL LOW (ref >90)
GFR, EST NON AFRICAN AMERICAN: 50 mL/min — AB (ref >90)
Glucose, Bld: 110 mg/dL — ABNORMAL HIGH (ref 70–99)
POTASSIUM: 3 meq/L — AB (ref 3.7–5.3)
Sodium: 139 mEq/L (ref 137–147)
Total Protein: 7.1 g/dL (ref 6.0–8.3)

## 2014-08-13 LAB — CBC WITH DIFFERENTIAL/PLATELET
Basophils Absolute: 0 10*3/uL (ref 0.0–0.1)
Basophils Relative: 0 % (ref 0–1)
Eosinophils Absolute: 0.1 10*3/uL (ref 0.0–0.7)
Eosinophils Relative: 1 % (ref 0–5)
HEMATOCRIT: 37.9 % (ref 36.0–46.0)
HEMOGLOBIN: 12.7 g/dL (ref 12.0–15.0)
LYMPHS PCT: 9 % — AB (ref 12–46)
Lymphs Abs: 0.9 10*3/uL (ref 0.7–4.0)
MCH: 28.9 pg (ref 26.0–34.0)
MCHC: 33.5 g/dL (ref 30.0–36.0)
MCV: 86.1 fL (ref 78.0–100.0)
MONO ABS: 0.9 10*3/uL (ref 0.1–1.0)
MONOS PCT: 9 % (ref 3–12)
Neutro Abs: 8.6 10*3/uL — ABNORMAL HIGH (ref 1.7–7.7)
Neutrophils Relative %: 81 % — ABNORMAL HIGH (ref 43–77)
Platelets: 239 10*3/uL (ref 150–400)
RBC: 4.4 MIL/uL (ref 3.87–5.11)
RDW: 12.9 % (ref 11.5–15.5)
WBC: 10.5 10*3/uL (ref 4.0–10.5)

## 2014-08-13 LAB — URINALYSIS, ROUTINE W REFLEX MICROSCOPIC
BILIRUBIN URINE: NEGATIVE
Glucose, UA: NEGATIVE mg/dL
HGB URINE DIPSTICK: NEGATIVE
Ketones, ur: NEGATIVE mg/dL
Leukocytes, UA: NEGATIVE
NITRITE: NEGATIVE
PH: 6.5 (ref 5.0–8.0)
Protein, ur: NEGATIVE mg/dL
Specific Gravity, Urine: 1.006 (ref 1.005–1.030)
Urobilinogen, UA: 0.2 mg/dL (ref 0.0–1.0)

## 2014-08-13 LAB — I-STAT CG4 LACTIC ACID, ED: Lactic Acid, Venous: 0.7 mmol/L (ref 0.5–2.2)

## 2014-08-13 LAB — TROPONIN I: Troponin I: <0.3 ng/mL (ref <0.30)

## 2014-08-13 LAB — LIPASE, BLOOD: Lipase: 28 U/L (ref 11–59)

## 2014-08-13 MED ORDER — ONDANSETRON HCL 4 MG PO TABS: 4.0000 mg | ORAL_TABLET | Freq: Four times a day (QID) | ORAL | Status: DC

## 2014-08-13 NOTE — ED Notes (Signed)
Bed: WA01 Expected date:  Expected time:  Means of arrival:  Comments: ems 

## 2014-08-13 NOTE — ED Notes (Addendum)
Per EMS, Pt from home. Pt c/o N/V/V x three days. Pt attributes this to a medication change, amitiza. 30 minutes after taking amizita, pt began N/V/D.  Orthostatic vital signs, dizzy upon standing. Pt given 500 NS bolus by EMS as well as 4mg  Zofran.

## 2014-08-13 NOTE — Discharge Instructions (Signed)
Abdominal Pain  Many things can cause abdominal pain. Usually, abdominal pain is not caused by a disease and will improve without treatment. It can often be observed and treated at home. Your health care provider will do a physical exam and possibly order blood tests and X-rays to help determine the seriousness of your pain. However, in many cases, more time must pass before a clear cause of the pain can be found. Before that point, your health care provider may not know if you need more testing or further treatment.  HOME CARE INSTRUCTIONS   Monitor your abdominal pain for any changes. The following actions may help to alleviate any discomfort you are experiencing:   Only take over-the-counter or prescription medicines as directed by your health care provider.   Do not take laxatives unless directed to do so by your health care provider.   Try a clear liquid diet (broth, tea, or water) as directed by your health care provider. Slowly move to a bland diet as tolerated.  SEEK MEDICAL CARE IF:   You have unexplained abdominal pain.   You have abdominal pain associated with nausea or diarrhea.   You have pain when you urinate or have a bowel movement.   You experience abdominal pain that wakes you in the night.   You have abdominal pain that is worsened or improved by eating food.   You have abdominal pain that is worsened with eating fatty foods.   You have a fever.  SEEK IMMEDIATE MEDICAL CARE IF:    Your pain does not go away within 2 hours.   You keep throwing up (vomiting).   Your pain is felt only in portions of the abdomen, such as the right side or the left lower portion of the abdomen.   You pass bloody or black tarry stools.  MAKE SURE YOU:   Understand these instructions.    Will watch your condition.    Will get help right away if you are not doing well or get worse.   Document Released: 07/05/2005 Document Revised: 09/30/2013 Document Reviewed: 06/04/2013  [employer] Patient Information  2015 [employer]. This information is not intended to replace advice given to you by your health care provider. Make sure you discuss any questions you have with your health care provider.  Diarrhea  Diarrhea is frequent loose and watery bowel movements. It can cause you to feel weak and dehydrated. Dehydration can cause you to become tired and thirsty, have a dry mouth, and have decreased urination that often is dark yellow. Diarrhea is a sign of another problem, most often an infection that will not last long. In most cases, diarrhea typically lasts 2-3 days. However, it can last longer if it is a sign of something more serious. It is important to treat your diarrhea as directed by your caregiver to lessen or prevent future episodes of diarrhea.  CAUSES   Some common causes include:   Gastrointestinal infections caused by viruses, bacteria, or parasites.   Food poisoning or food allergies.   Certain medicines, such as antibiotics, chemotherapy, and laxatives.   Artificial sweeteners and fructose.   Digestive disorders.  HOME CARE INSTRUCTIONS   Ensure adequate fluid intake (hydration): Have 1 cup (8 oz) of fluid for each diarrhea episode. Avoid fluids that contain simple sugars or sports drinks, fruit juices, whole milk products, and sodas. Your urine should be clear or pale yellow if you are drinking enough fluids. Hydrate with an oral rehydration solution that you   can purchase at pharmacies, retail stores, and online. You can prepare an oral rehydration solution at home by mixing the following ingredients together:    - tsp table salt.    tsp baking soda.    tsp salt substitute containing potassium chloride.   1  tablespoons sugar.   1 L (34 oz) of water.   Certain foods and beverages may increase the speed at which food moves through the gastrointestinal (GI) tract. These foods and beverages should be avoided and include:   Caffeinated and alcoholic beverages.   High-fiber foods, such as raw  fruits and vegetables, nuts, seeds, and whole grain breads and cereals.   Foods and beverages sweetened with sugar alcohols, such as xylitol, sorbitol, and mannitol.   Some foods may be well tolerated and may help thicken stool including:   Starchy foods, such as rice, toast, pasta, low-sugar cereal, oatmeal, grits, baked potatoes, crackers, and bagels.   Bananas.   Applesauce.   Add probiotic-rich foods to help increase healthy bacteria in the GI tract, such as yogurt and fermented milk products.   Wash your hands well after each diarrhea episode.   Only take over-the-counter or prescription medicines as directed by your caregiver.   Take a warm bath to relieve any burning or pain from frequent diarrhea episodes.  SEEK IMMEDIATE MEDICAL CARE IF:    You are unable to keep fluids down.   You have persistent vomiting.   You have blood in your stool, or your stools are black and tarry.   You do not urinate in 6-8 hours, or there is only a small amount of very dark urine.   You have abdominal pain that increases or localizes.   You have weakness, dizziness, confusion, or light-headedness.   You have a severe headache.   Your diarrhea gets worse or does not get better.   You have a fever or persistent symptoms for more than 2-3 days.   You have a fever and your symptoms suddenly get worse.  MAKE SURE YOU:    Understand these instructions.   Will watch your condition.   Will get help right away if you are not doing well or get worse.  Document Released: 09/15/2002 Document Revised: 02/09/2014 Document Reviewed: 06/02/2012  [employer] Patient Information 2015 [employer]. This information is not intended to replace advice given to you by your health care provider. Make sure you discuss any questions you have with your health care provider.  Nausea and Vomiting  Nausea is a sick feeling that often comes before throwing up (vomiting). Vomiting is a reflex where stomach contents come out of your mouth.  Vomiting can cause severe loss of body fluids (dehydration). Children and elderly adults can become dehydrated quickly, especially if they also have diarrhea. Nausea and vomiting are symptoms of a condition or disease. It is important to find the cause of your symptoms.  CAUSES    Direct irritation of the stomach lining. This irritation can result from increased acid production (gastroesophageal reflux disease), infection, food poisoning, taking certain medicines (such as nonsteroidal anti-inflammatory drugs), alcohol use, or tobacco use.   Signals from the brain.These signals could be caused by a headache, heat exposure, an inner ear disturbance, increased pressure in the brain from injury, infection, a tumor, or a concussion, pain, emotional stimulus, or metabolic problems.   An obstruction in the gastrointestinal tract (bowel obstruction).   Illnesses such as diabetes, hepatitis, gallbladder problems, appendicitis, kidney problems, cancer, sepsis, atypical symptoms of a   heart attack, or eating disorders.   Medical treatments such as chemotherapy and radiation.   Receiving medicine that makes you sleep (general anesthetic) during surgery.  DIAGNOSIS  Your caregiver may ask for tests to be done if the problems do not improve after a few days. Tests may also be done if symptoms are severe or if the reason for the nausea and vomiting is not clear. Tests may include:   Urine tests.   Blood tests.   Stool tests.   Cultures (to look for evidence of infection).   X-rays or other imaging studies.  Test results can help your caregiver make decisions about treatment or the need for additional tests.  TREATMENT  You need to stay well hydrated. Drink frequently but in small amounts.You may wish to drink water, sports drinks, clear broth, or eat frozen ice pops or gelatin dessert to help stay hydrated.When you eat, eating slowly may help prevent nausea.There are also some antinausea medicines that may help  prevent nausea.  HOME CARE INSTRUCTIONS    Take all medicine as directed by your caregiver.   If you do not have an appetite, do not force yourself to eat. However, you must continue to drink fluids.   If you have an appetite, eat a normal diet unless your caregiver tells you differently.   Eat a variety of complex carbohydrates (rice, wheat, potatoes, bread), lean meats, yogurt, fruits, and vegetables.   Avoid high-fat foods because they are more difficult to digest.   Drink enough water and fluids to keep your urine clear or pale yellow.   If you are dehydrated, ask your caregiver for specific rehydration instructions. Signs of dehydration may include:   Severe thirst.   Dry lips and mouth.   Dizziness.   Dark urine.   Decreasing urine frequency and amount.   Confusion.   Rapid breathing or pulse.  SEEK IMMEDIATE MEDICAL CARE IF:    You have blood or brown flecks (like coffee grounds) in your vomit.   You have black or bloody stools.   You have a severe headache or stiff neck.   You are confused.   You have severe abdominal pain.   You have chest pain or trouble breathing.   You do not urinate at least once every 8 hours.   You develop cold or clammy skin.   You continue to vomit for longer than 24 to 48 hours.   You have a fever.  MAKE SURE YOU:    Understand these instructions.   Will watch your condition.   Will get help right away if you are not doing well or get worse.  Document Released: 09/25/2005 Document Revised: 12/18/2011 Document Reviewed: 02/22/2011  [employer] Patient Information 2015 [employer]. This information is not intended to replace advice given to you by your health care provider. Make sure you discuss any questions you have with your health care provider.

## 2014-08-13 NOTE — ED Provider Notes (Signed)
CSN: [id number]     Arrival date & time 08/13/14  1901 History   First MD Initiated Contact with Patient 08/13/14 1910     Chief Complaint  Patient presents with  . Emesis  . Diarrhea     (Consider location/radiation/quality/duration/timing/severity/associated sxs/prior Treatment) HPI Comments: Patient presents to the ER for evaluation of nausea, vomiting, diarrhea. Patient reports symptoms began 2 days ago. She reports upper abdominal and lower chest discomfort. She reports that she had multiple episodes of diarrhea yesterday. She reports that the diarrhea stopped, but today she has had increased pain with nausea and vomiting. She has not had any rectal bleeding or melena. No hematemesis. No fever. Patient denies shortness of breath.  Patient is a 80 y.o. female presenting with vomiting and diarrhea.  Emesis Associated symptoms: abdominal pain and diarrhea   Diarrhea Associated symptoms: abdominal pain and vomiting   Associated symptoms: no fever     Past Medical History  Diagnosis Date  . Back pain   . Hypertension   . Reflux   . Coronary artery disease   . Hyperlipidemia   . Osteoarthritis   . Mitral valve prolapse   . Chronic pain   . Osteoporosis   . Cataracts, both eyes   . Pain management   . Chronic narcotic use    Past Surgical History  Procedure Laterality Date  . Appendectomy     No family history on file. History  Substance Use Topics  . Smoking status: Never Smoker   . Smokeless tobacco: Not on file  . Alcohol Use: No   OB History    No data available     Review of Systems  Constitutional: Negative for fever.  Respiratory: Negative for shortness of breath.   Cardiovascular: Positive for chest pain.  Gastrointestinal: Positive for nausea, vomiting, abdominal pain and diarrhea.  All other systems reviewed and are negative.     Allergies  Amitiza; Aspirin; Benadryl; Ibuprofen; and Sulfa antibiotics  Home Medications   Prior to Admission  medications   Medication Sig Start Date End Date Taking? Authorizing Provider  diltiazem (TIAZAC) 360 MG 24 hr capsule Take 360 mg by mouth daily.   Yes Historical Provider, MD  docusate sodium 100 MG CAPS Take 100 mg by mouth daily. 12/15/13  Yes Katy Apoonald D Polite, MD  fentaNYL (DURAGESIC - DOSED MCG/HR) 50 MCG/HR Place 75 mcg onto the skin every 3 (three) days.    Yes Historical Provider, MD  nitroGLYCERIN (NITROSTAT) 0.4 MG SL tablet Place 0.4 mg under the tongue every 5 (five) minutes as needed for chest pain.   Yes Historical Provider, MD  Oxycodone HCl 10 MG TABS Take 10 mg by mouth every 4 (four) hours as needed (for breakthrough pain).   Yes Historical Provider, MD  pantoprazole (PROTONIX) 40 MG tablet Take 40 mg by mouth daily.   Yes Historical Provider, MD  polyethylene glycol (MIRALAX / GLYCOLAX) packet Take 17 g by mouth daily.   Yes Historical Provider, MD  simvastatin (ZOCOR) 20 MG tablet Take 20 mg by mouth every evening.   Yes Historical Provider, MD  triamterene-hydrochlorothiazide (DYAZIDE) 37.5-25 MG per capsule Take 1 capsule by mouth daily.   Yes Historical Provider, MD  calcium carbonate 200 MG capsule Take 250 mg by mouth 2 (two) times daily with a meal.    Historical Provider, MD  esomeprazole (NEXIUM) 40 MG capsule Take 40 mg by mouth daily at 12 noon.    Historical Provider, MD   BP 124/60  mmHg  Pulse 68  Temp(Src) 98 F (36.7 C) (Oral)  Resp 18  SpO2 97% Physical Exam  Constitutional: She is oriented to person, place, and time. She appears well-developed and well-nourished. No distress.  HENT:  Head: Normocephalic and atraumatic.  Right Ear: Hearing normal.  Left Ear: Hearing normal.  Nose: Nose normal.  Mouth/Throat: Oropharynx is clear and moist and mucous membranes are normal.  Eyes: Conjunctivae and EOM are normal. Pupils are equal, round, and reactive to light.  Neck: Normal range of motion. Neck supple.  Cardiovascular: Regular rhythm, S1 normal and S2  normal.  Exam reveals no gallop and no friction rub.   No murmur heard. Pulmonary/Chest: Effort normal and breath sounds normal. No respiratory distress. She exhibits no tenderness.  Abdominal: Soft. Normal appearance and bowel sounds are normal. There is no hepatosplenomegaly. There is tenderness in the epigastric area. There is no rebound, no guarding, no tenderness at McBurney's point and negative Murphy's sign. No hernia.  Musculoskeletal: Normal range of motion.  Neurological: She is alert and oriented to person, place, and time. She has normal strength. No cranial nerve deficit or sensory deficit. Coordination normal. GCS eye subscore is 4. GCS verbal subscore is 5. GCS motor subscore is 6.  Skin: Skin is warm, dry and intact. No rash noted. No cyanosis.  Psychiatric: She has a normal mood and affect. Her speech is normal and behavior is normal. Thought content normal.    ED Course  Procedures (including critical care time) Labs Review Labs Reviewed  CBC WITH DIFFERENTIAL - Abnormal; Notable for the following:    Neutrophils Relative % 81 (*)    Neutro Abs 8.6 (*)    Lymphocytes Relative 9 (*)    All other components within normal limits  COMPREHENSIVE METABOLIC PANEL - Abnormal; Notable for the following:    Potassium 3.0 (*)    Glucose, Bld 110 (*)    GFR calc non Af Amer 50 (*)    GFR calc Af Amer 57 (*)    All other components within normal limits  URINALYSIS, ROUTINE W REFLEX MICROSCOPIC - Abnormal; Notable for the following:    APPearance CLOUDY (*)    All other components within normal limits  LIPASE, BLOOD  TROPONIN I  I-STAT CG4 LACTIC ACID, ED    Imaging Review Dg Abd Acute W/chest  08/13/2014   CLINICAL DATA:  Abdominal pain.  Nausea and vomiting.  EXAM: ACUTE ABDOMEN SERIES (ABDOMEN 2 VIEW & CHEST 1 VIEW)  COMPARISON:  11/12/2007 and 02/06/2014  FINDINGS: Right shoulder arthroplasty. Heart size is within normal limits. Lungs are clear bilaterally. Negative for  free air. There are vascular calcifications. Nonobstructive bowel gas pattern. Stable large calcification or sclerotic density overlying the right SI joint region.  IMPRESSION: No acute findings.   Electronically Signed   By: Richarda Overlie M.D.   On: 08/13/2014 20:35     EKG Interpretation   Date/Time:  Thursday August 13 2014 19:07:48 EST Ventricular Rate:  69 PR Interval:  149 QRS Duration: 94 QT Interval:  421 QTC Calculation: 451 R Axis:   -16 Text Interpretation:  Sinus rhythm Borderline left axis deviation No  significant change since last tracing Confirmed by POLLINA  MD,  CHRISTOPHER [phone]) on 08/13/2014 7:54:34 PM      MDM   Final diagnoses:  Abdominal pain  Acute vomiting  Diarrhea    To the ER for evaluation of nausea, vomiting, diarrhea with epigastric discomfort. Patient reports that the diarrhea was  present all day yesterday and then resolved. Today she had nausea and vomiting with epigastric pain. Patient has mild epigastric tenderness, no right upper quadrant tenderness or Murphy sign. Patient does have a history of small bowel obstruction. Acute abdominal series x-ray, however, was unremarkable. On the patient's lab work was normal. Patient gently hydrated. She has had significant improvement. Pain has now resolved. Cardiac evaluation was performed because of the epigastric discomfort, no overt chest pain. Cardiac evaluation was negative. Patient is safe for discharge, follow-up with primary doctor. Return if symptoms worsen.    Gilda Creasehristopher J. Pollina, MD 08/13/14 2223

## 2014-08-13 NOTE — ED Notes (Signed)
Pt unfortunately used latrine while in x-ray, did not collect specimen, pt is unable to go at this time

## 2014-08-17 DIAGNOSIS — E876 Hypokalemia: Secondary | ICD-10-CM | POA: Diagnosis not present

## 2014-08-17 DIAGNOSIS — R197 Diarrhea, unspecified: Secondary | ICD-10-CM | POA: Diagnosis not present

## 2014-08-26 DIAGNOSIS — Z23 Encounter for immunization: Secondary | ICD-10-CM | POA: Diagnosis not present

## 2014-09-01 DIAGNOSIS — G894 Chronic pain syndrome: Secondary | ICD-10-CM | POA: Diagnosis not present

## 2014-09-01 DIAGNOSIS — M4806 Spinal stenosis, lumbar region: Secondary | ICD-10-CM | POA: Diagnosis not present

## 2014-09-01 DIAGNOSIS — M47812 Spondylosis without myelopathy or radiculopathy, cervical region: Secondary | ICD-10-CM | POA: Diagnosis not present

## 2014-09-01 DIAGNOSIS — M47816 Spondylosis without myelopathy or radiculopathy, lumbar region: Secondary | ICD-10-CM | POA: Diagnosis not present

## 2014-09-29 DIAGNOSIS — M4806 Spinal stenosis, lumbar region: Secondary | ICD-10-CM | POA: Diagnosis not present

## 2014-09-29 DIAGNOSIS — M47816 Spondylosis without myelopathy or radiculopathy, lumbar region: Secondary | ICD-10-CM | POA: Diagnosis not present

## 2014-09-29 DIAGNOSIS — M47812 Spondylosis without myelopathy or radiculopathy, cervical region: Secondary | ICD-10-CM | POA: Diagnosis not present

## 2014-09-29 DIAGNOSIS — G894 Chronic pain syndrome: Secondary | ICD-10-CM | POA: Diagnosis not present

## 2014-10-29 DIAGNOSIS — M4806 Spinal stenosis, lumbar region: Secondary | ICD-10-CM | POA: Diagnosis not present

## 2014-10-29 DIAGNOSIS — M47812 Spondylosis without myelopathy or radiculopathy, cervical region: Secondary | ICD-10-CM | POA: Diagnosis not present

## 2014-10-29 DIAGNOSIS — M47816 Spondylosis without myelopathy or radiculopathy, lumbar region: Secondary | ICD-10-CM | POA: Diagnosis not present

## 2014-10-29 DIAGNOSIS — G894 Chronic pain syndrome: Secondary | ICD-10-CM | POA: Diagnosis not present

## 2014-11-13 DIAGNOSIS — M810 Age-related osteoporosis without current pathological fracture: Secondary | ICD-10-CM | POA: Diagnosis not present

## 2014-11-13 DIAGNOSIS — M2550 Pain in unspecified joint: Secondary | ICD-10-CM | POA: Diagnosis not present

## 2014-11-13 DIAGNOSIS — Z0000 Encounter for general adult medical examination without abnormal findings: Secondary | ICD-10-CM | POA: Diagnosis not present

## 2014-11-13 DIAGNOSIS — Z23 Encounter for immunization: Secondary | ICD-10-CM | POA: Diagnosis not present

## 2014-11-13 DIAGNOSIS — K5900 Constipation, unspecified: Secondary | ICD-10-CM | POA: Diagnosis not present

## 2014-11-13 DIAGNOSIS — E782 Mixed hyperlipidemia: Secondary | ICD-10-CM | POA: Diagnosis not present

## 2014-11-13 DIAGNOSIS — Z1389 Encounter for screening for other disorder: Secondary | ICD-10-CM | POA: Diagnosis not present

## 2014-11-13 DIAGNOSIS — I10 Essential (primary) hypertension: Secondary | ICD-10-CM | POA: Diagnosis not present

## 2014-11-13 DIAGNOSIS — K219 Gastro-esophageal reflux disease without esophagitis: Secondary | ICD-10-CM | POA: Diagnosis not present

## 2014-11-13 DIAGNOSIS — I2510 Atherosclerotic heart disease of native coronary artery without angina pectoris: Secondary | ICD-10-CM | POA: Diagnosis not present

## 2014-11-16 ENCOUNTER — Other Ambulatory Visit: Payer: Self-pay

## 2014-11-16 DIAGNOSIS — Z1231 Encounter for screening mammogram for malignant neoplasm of breast: Secondary | ICD-10-CM

## 2014-11-20 ENCOUNTER — Emergency Department (HOSPITAL_COMMUNITY): Payer: Medicare Other

## 2014-11-20 ENCOUNTER — Emergency Department (HOSPITAL_COMMUNITY)
Admission: EM | Admit: 2014-11-20 | Discharge: 2014-11-20 | Disposition: A | Discharge status: Home / Self Care | Payer: Medicare Other | Attending: Emergency Medicine | Admitting: Emergency Medicine

## 2014-11-20 ENCOUNTER — Encounter (HOSPITAL_COMMUNITY): Payer: Self-pay | Admitting: Emergency Medicine

## 2014-11-20 DIAGNOSIS — I10 Essential (primary) hypertension: Secondary | ICD-10-CM | POA: Diagnosis not present

## 2014-11-20 DIAGNOSIS — R112 Nausea with vomiting, unspecified: Secondary | ICD-10-CM | POA: Insufficient documentation

## 2014-11-20 DIAGNOSIS — E785 Hyperlipidemia, unspecified: Secondary | ICD-10-CM | POA: Diagnosis not present

## 2014-11-20 DIAGNOSIS — Z8739 Personal history of other diseases of the musculoskeletal system and connective tissue: Secondary | ICD-10-CM | POA: Diagnosis not present

## 2014-11-20 DIAGNOSIS — I2510 Atherosclerotic heart disease of native coronary artery without angina pectoris: Secondary | ICD-10-CM | POA: Insufficient documentation

## 2014-11-20 DIAGNOSIS — G8929 Other chronic pain: Secondary | ICD-10-CM | POA: Insufficient documentation

## 2014-11-20 DIAGNOSIS — R109 Unspecified abdominal pain: Secondary | ICD-10-CM | POA: Diagnosis present

## 2014-11-20 DIAGNOSIS — R1033 Periumbilical pain: Secondary | ICD-10-CM | POA: Diagnosis not present

## 2014-11-20 DIAGNOSIS — N3289 Other specified disorders of bladder: Secondary | ICD-10-CM | POA: Diagnosis not present

## 2014-11-20 DIAGNOSIS — Z79899 Other long term (current) drug therapy: Secondary | ICD-10-CM | POA: Insufficient documentation

## 2014-11-20 DIAGNOSIS — K219 Gastro-esophageal reflux disease without esophagitis: Secondary | ICD-10-CM | POA: Insufficient documentation

## 2014-11-20 DIAGNOSIS — R1013 Epigastric pain: Secondary | ICD-10-CM | POA: Diagnosis not present

## 2014-11-20 DIAGNOSIS — N281 Cyst of kidney, acquired: Secondary | ICD-10-CM | POA: Diagnosis not present

## 2014-11-20 LAB — URINALYSIS, ROUTINE W REFLEX MICROSCOPIC
Bilirubin Urine: NEGATIVE
GLUCOSE, UA: NEGATIVE mg/dL
HGB URINE DIPSTICK: NEGATIVE
KETONES UR: NEGATIVE mg/dL
Nitrite: NEGATIVE
PH: 7.5 (ref 5.0–8.0)
PROTEIN: NEGATIVE mg/dL
Specific Gravity, Urine: 1.01 (ref 1.005–1.030)
Urobilinogen, UA: 0.2 mg/dL (ref 0.0–1.0)

## 2014-11-20 LAB — CBC WITH DIFFERENTIAL/PLATELET
BASOS ABS: 0 10*3/uL (ref 0.0–0.1)
BASOS PCT: 0 % (ref 0–1)
EOS ABS: 0.1 10*3/uL (ref 0.0–0.7)
EOS PCT: 1 % (ref 0–5)
HCT: 40 % (ref 36.0–46.0)
Hemoglobin: 13.1 g/dL (ref 12.0–15.0)
Lymphocytes Relative: 10 % — ABNORMAL LOW (ref 12–46)
Lymphs Abs: 0.8 10*3/uL (ref 0.7–4.0)
MCH: 28.6 pg (ref 26.0–34.0)
MCHC: 32.8 g/dL (ref 30.0–36.0)
MCV: 87.3 fL (ref 78.0–100.0)
MONOS PCT: 9 % (ref 3–12)
Monocytes Absolute: 0.7 10*3/uL (ref 0.1–1.0)
NEUTROS ABS: 6.1 10*3/uL (ref 1.7–7.7)
Neutrophils Relative %: 80 % — ABNORMAL HIGH (ref 43–77)
PLATELETS: 268 10*3/uL (ref 150–400)
RBC: 4.58 MIL/uL (ref 3.87–5.11)
RDW: 13.3 % (ref 11.5–15.5)
WBC: 7.6 10*3/uL (ref 4.0–10.5)

## 2014-11-20 LAB — COMPREHENSIVE METABOLIC PANEL
ALT: 17 U/L (ref 0–35)
ANION GAP: 9 (ref 5–15)
AST: 19 U/L (ref 0–37)
Albumin: 4.4 g/dL (ref 3.5–5.2)
Alkaline Phosphatase: 75 U/L (ref 39–117)
BILIRUBIN TOTAL: 0.3 mg/dL (ref 0.3–1.2)
BUN: 17 mg/dL (ref 6–23)
CALCIUM: 9.6 mg/dL (ref 8.4–10.5)
CO2: 32 mmol/L (ref 19–32)
CREATININE: 0.89 mg/dL (ref 0.50–1.10)
Chloride: 100 mmol/L (ref 96–112)
GFR calc Af Amer: 68 mL/min — ABNORMAL LOW (ref >90)
GFR, EST NON AFRICAN AMERICAN: 59 mL/min — AB (ref >90)
Glucose, Bld: 114 mg/dL — ABNORMAL HIGH (ref 70–99)
Potassium: 3.4 mmol/L — ABNORMAL LOW (ref 3.5–5.1)
SODIUM: 141 mmol/L (ref 135–145)
Total Protein: 7.7 g/dL (ref 6.0–8.3)

## 2014-11-20 LAB — URINE MICROSCOPIC-ADD ON

## 2014-11-20 LAB — LIPASE, BLOOD: LIPASE: 36 U/L (ref 11–59)

## 2014-11-20 LAB — I-STAT CG4 LACTIC ACID, ED: Lactic Acid, Venous: 0.41 mmol/L — ABNORMAL LOW (ref 0.5–2.0)

## 2014-11-20 MED ORDER — IOHEXOL 300 MG/ML  SOLN
100.0000 mL | Freq: Once | INTRAMUSCULAR | Status: AC | PRN
  Administered 2014-11-20: 100 mL via INTRAVENOUS

## 2014-11-20 MED ORDER — PROMETHAZINE HCL 25 MG PO TABS: 25.0000 mg | ORAL_TABLET | Freq: Three times a day (TID) | ORAL | Status: DC | PRN

## 2014-11-20 MED ORDER — SODIUM CHLORIDE 0.9 % IV BOLUS (SEPSIS)
500.0000 mL | Freq: Once | INTRAVENOUS | Status: AC
  Administered 2014-11-20: 500 mL via INTRAVENOUS

## 2014-11-20 MED ORDER — ONDANSETRON HCL 4 MG/2ML IJ SOLN
4.0000 mg | Freq: Once | INTRAMUSCULAR | Status: AC
  Administered 2014-11-20: 4 mg via INTRAVENOUS
  Filled 2014-11-20: qty 2

## 2014-11-20 MED ORDER — IOHEXOL 300 MG/ML  SOLN: 50.0000 mL | Freq: Once | INTRAMUSCULAR | Status: DC | PRN

## 2014-11-20 MED ORDER — ONDANSETRON HCL 4 MG/2ML IJ SOLN: 4.0000 mg | Freq: Once | INTRAMUSCULAR | Status: DC

## 2014-11-20 MED ORDER — MORPHINE SULFATE 4 MG/ML IJ SOLN
4.0000 mg | Freq: Once | INTRAMUSCULAR | Status: AC
  Administered 2014-11-20: 4 mg via INTRAVENOUS
  Filled 2014-11-20: qty 1

## 2014-11-20 NOTE — Discharge Instructions (Signed)
Return here as needed.  Follow-up with your primary care provider, CT scan and laboratory testing was normal.  Increase your fluid intake

## 2014-11-20 NOTE — ED Provider Notes (Signed)
CSN: [id number]     Arrival date & time 11/20/14  1707 History   First MD Initiated Contact with Patient 11/20/14 1900     Chief Complaint  Patient presents with  . Abdominal Pain     (Consider location/radiation/quality/duration/timing/severity/associated sxs/prior Treatment) HPI   Patient with PMH of SBO, reflux, HTN, HLD, and CAD presents today complaining of "constant cramping" abdominal pain that started this afternoon around 4 PM.  Patient noticed non-radiating abdominal cramps in the epigastric area followed by emesis and now endorses nausea.  BMs have been normal for her and her last BM was this morning.  Denies fever, night sweats, chills, bloody emesis, hematochezia, diarrhea/constipation, and dysuria.   Past Medical History  Diagnosis Date  . Back pain   . Hypertension   . Reflux   . Coronary artery disease   . Hyperlipidemia   . Osteoarthritis   . Mitral valve prolapse   . Chronic pain   . Osteoporosis   . Cataracts, both eyes   . Pain management   . Chronic narcotic use    Past Surgical History  Procedure Laterality Date  . Appendectomy     No family history on file. History  Substance Use Topics  . Smoking status: Never Smoker   . Smokeless tobacco: Not on file  . Alcohol Use: No   OB History    No data available     Review of Systems  All other systems negative except as documented in the HPI. All pertinent positives and negatives as reviewed in the HPI.   Allergies  Amitiza; Aspirin; Benadryl; Ibuprofen; and Sulfa antibiotics  Home Medications   Prior to Admission medications   Medication Sig Start Date End Date Taking? Authorizing Provider  calcium carbonate 200 MG capsule Take 250 mg by mouth 2 (two) times daily with a meal.    Historical Provider, MD  diltiazem (TIAZAC) 360 MG 24 hr capsule Take 360 mg by mouth daily.    Historical Provider, MD  docusate sodium 100 MG CAPS Take 100 mg by mouth daily. 12/15/13   Katy Apo, MD   esomeprazole (NEXIUM) 40 MG capsule Take 40 mg by mouth daily at 12 noon.    Historical Provider, MD  fentaNYL (DURAGESIC - DOSED MCG/HR) 50 MCG/HR Place 75 mcg onto the skin every 3 (three) days.     Historical Provider, MD  nitroGLYCERIN (NITROSTAT) 0.4 MG SL tablet Place 0.4 mg under the tongue every 5 (five) minutes as needed for chest pain.    Historical Provider, MD  ondansetron (ZOFRAN) 4 MG tablet Take 1 tablet (4 mg total) by mouth every 6 (six) hours. 08/13/14   Gilda Crease, MD  Oxycodone HCl 10 MG TABS Take 10 mg by mouth every 4 (four) hours as needed (for breakthrough pain).    Historical Provider, MD  pantoprazole (PROTONIX) 40 MG tablet Take 40 mg by mouth daily.    Historical Provider, MD  polyethylene glycol (MIRALAX / GLYCOLAX) packet Take 17 g by mouth daily.    Historical Provider, MD  simvastatin (ZOCOR) 20 MG tablet Take 20 mg by mouth every evening.    Historical Provider, MD  triamterene-hydrochlorothiazide (DYAZIDE) 37.5-25 MG per capsule Take 1 capsule by mouth daily.    Historical Provider, MD   BP 163/66 mmHg  Pulse 80  Temp(Src) 99.1 F (37.3 C) (Oral)  Resp 18  SpO2 98% Physical Exam  Constitutional: She is oriented to person, place, and time. She appears well-developed and well-nourished.  HENT:  Head: Normocephalic and atraumatic.  Mouth/Throat: Oropharynx is clear and moist.  Eyes: Conjunctivae and EOM are normal. Pupils are equal, round, and reactive to light.  Neck: Normal range of motion. Neck supple.  Cardiovascular: Normal rate, regular rhythm and normal heart sounds.  Exam reveals no gallop and no friction rub.   No murmur heard. Pulmonary/Chest: Effort normal and breath sounds normal. She has no wheezes. She has no rales.  Abdominal: Soft. Normal appearance and bowel sounds are normal. She exhibits no distension. There is tenderness in the epigastric area. There is no rebound and no guarding.  Musculoskeletal: She exhibits no edema.   Neurological: She is alert and oriented to person, place, and time.  Skin: Skin is warm and dry. No rash noted. No erythema. No pallor.  Nursing note and vitals reviewed.   ED Course  Procedures (including critical care time) Labs Review Labs Reviewed  CBC WITH DIFFERENTIAL/PLATELET - Abnormal; Notable for the following:    Neutrophils Relative % 80 (*)    Lymphocytes Relative 10 (*)    All other components within normal limits  COMPREHENSIVE METABOLIC PANEL - Abnormal; Notable for the following:    Potassium 3.4 (*)    Glucose, Bld 114 (*)    GFR calc non Af Amer 59 (*)    GFR calc Af Amer 68 (*)    All other components within normal limits  LIPASE, BLOOD  URINALYSIS, ROUTINE W REFLEX MICROSCOPIC    Imaging Review Ct Abdomen Pelvis W Contrast  11/20/2014   CLINICAL DATA:  Generalized abdominal pain. Nausea, vomiting since this afternoon. The patient reports that the last time this happened her intestines were twisted.  EXAM: CT ABDOMEN AND PELVIS WITH CONTRAST  TECHNIQUE: Multidetector CT imaging of the abdomen and pelvis was performed using the standard protocol following bolus administration of intravenous contrast.  CONTRAST:  OMNIPAQUE IOHEXOL 300 MG/ML  SOLN  COMPARISON:  CT of the abdomen and pelvis 12/13/2013  FINDINGS: Lower chest: Lung bases are unremarkable. Coronary artery calcifications are present.  Upper abdomen: No focal abnormality identified within the liver, spleen, pancreas, or adrenal glands. Right pelvic kidney contains a 5.1 cm cyst. Left renal cyst is 3.0 cm. Gallbladder is present.  Gastrointestinal tract: Stomach has a normal appearance. Small bowel loops are nondilated. Colonic loops contain significant stool and are otherwise normal in appearance.  Pelvis: Urinary bladder is markedly distended. The uterus is absent. No adnexal mass or free pelvic fluid.  Retroperitoneum: No retroperitoneal or mesenteric adenopathy. There is dense atherosclerotic  calcification of the abdominal aorta.  Abdominal wall: Unremarkable.  Osseous structures: 8 mm anterolisthesis of L4 on L5. Degenerative changes at L5-S1.  IMPRESSION: 1. Right pelvic kidney. 2. Bilateral renal cysts. 3. No evidence for bowel obstruction. There is moderate stool burden however. 4. Distention of the urinary bladder. 5. Grade 1 anterolisthesis L4 on L5.   Electronically Signed   By: Norva Pavlov M.D.   On: 11/20/2014 21:30     EKG Interpretation   Date/Time:  Friday November 20 2014 21:48:34 EST Ventricular Rate:  72 PR Interval:  155 QRS Duration: 89 QT Interval:  417 QTC Calculation: 456 R Axis:   -43 Text Interpretation:  Sinus rhythm Left axis deviation No significant  change since last tracing Confirmed by ALLEN  MD, ANTHONY ([id number]) on  11/20/2014 10:11:57 PM      She Is feeling better at this time.  She does not have any CT scan findings that are  significant.  The patient is advised to return here as needed.  The patient's pain is just above her umbilicus and does not radiate.  Seems less likely to be cardiac and has a normal EKG    Carlyle DollyChristopher W Shantell Belongia, PA-C 11/20/14 2243  Toy BakerAnthony T Allen, MD 11/21/14 [phone]

## 2014-11-20 NOTE — ED Provider Notes (Signed)
Medical screening examination/treatment/procedure(s) were conducted as a shared visit with non-physician practitioner(s) and myself.  I personally evaluated the patient during the encounter.   EKG Interpretation   Date/Time:  Friday November 20 2014 21:48:34 EST Ventricular Rate:  72 PR Interval:  155 QRS Duration: 89 QT Interval:  417 QTC Calculation: 456 R Axis:   -43 Text Interpretation:  Sinus rhythm Left axis deviation No significant  change since last tracing Confirmed by Channa Hazelett  MD, Alekxander Isola ([id number]) on  11/20/2014 10:11:57 PM     Patient here with abdominal cramping that began today. Was concerned she may have a recurrence of her bowel obstruction. Abdominal CT negative for any acute process. Nausea has been controlled and she is stable for discharge  Toy BakerAnthony T Marlisha Vanwyk, MD 11/20/14 2212

## 2014-11-20 NOTE — ED Notes (Signed)

## 2014-11-20 NOTE — ED Notes (Addendum)
Pt c/o abd pain, n/v, states last time this happened her intestines were twisted. Brought in by daughter.

## 2014-11-20 NOTE — ED Notes (Addendum)
Patient transported to CT Will medicate patient for c/o pain when patient returns to room

## 2014-11-26 ENCOUNTER — Ambulatory Visit
Admission: RE | Admit: 2014-11-26 | Discharge: 2014-11-26 | Disposition: A | Discharge status: Home / Self Care | Payer: Medicare Other | Source: Ambulatory Visit

## 2014-11-26 DIAGNOSIS — Z1231 Encounter for screening mammogram for malignant neoplasm of breast: Secondary | ICD-10-CM

## 2014-11-30 DIAGNOSIS — M47812 Spondylosis without myelopathy or radiculopathy, cervical region: Secondary | ICD-10-CM | POA: Diagnosis not present

## 2014-11-30 DIAGNOSIS — G894 Chronic pain syndrome: Secondary | ICD-10-CM | POA: Diagnosis not present

## 2014-11-30 DIAGNOSIS — M4806 Spinal stenosis, lumbar region: Secondary | ICD-10-CM | POA: Diagnosis not present

## 2014-11-30 DIAGNOSIS — M47816 Spondylosis without myelopathy or radiculopathy, lumbar region: Secondary | ICD-10-CM | POA: Diagnosis not present

## 2014-12-28 DIAGNOSIS — M47812 Spondylosis without myelopathy or radiculopathy, cervical region: Secondary | ICD-10-CM | POA: Diagnosis not present

## 2014-12-28 DIAGNOSIS — G894 Chronic pain syndrome: Secondary | ICD-10-CM | POA: Diagnosis not present

## 2014-12-28 DIAGNOSIS — M47816 Spondylosis without myelopathy or radiculopathy, lumbar region: Secondary | ICD-10-CM | POA: Diagnosis not present

## 2014-12-28 DIAGNOSIS — Z79891 Long term (current) use of opiate analgesic: Secondary | ICD-10-CM | POA: Diagnosis not present

## 2014-12-28 DIAGNOSIS — M4806 Spinal stenosis, lumbar region: Secondary | ICD-10-CM | POA: Diagnosis not present

## 2015-01-08 DIAGNOSIS — L218 Other seborrheic dermatitis: Secondary | ICD-10-CM | POA: Diagnosis not present

## 2015-02-01 DIAGNOSIS — M47812 Spondylosis without myelopathy or radiculopathy, cervical region: Secondary | ICD-10-CM | POA: Diagnosis not present

## 2015-02-01 DIAGNOSIS — M4806 Spinal stenosis, lumbar region: Secondary | ICD-10-CM | POA: Diagnosis not present

## 2015-02-01 DIAGNOSIS — M47816 Spondylosis without myelopathy or radiculopathy, lumbar region: Secondary | ICD-10-CM | POA: Diagnosis not present

## 2015-02-01 DIAGNOSIS — G894 Chronic pain syndrome: Secondary | ICD-10-CM | POA: Diagnosis not present

## 2015-03-01 DIAGNOSIS — M47816 Spondylosis without myelopathy or radiculopathy, lumbar region: Secondary | ICD-10-CM | POA: Diagnosis not present

## 2015-03-01 DIAGNOSIS — G894 Chronic pain syndrome: Secondary | ICD-10-CM | POA: Diagnosis not present

## 2015-03-01 DIAGNOSIS — M47812 Spondylosis without myelopathy or radiculopathy, cervical region: Secondary | ICD-10-CM | POA: Diagnosis not present

## 2015-03-01 DIAGNOSIS — M4806 Spinal stenosis, lumbar region: Secondary | ICD-10-CM | POA: Diagnosis not present

## 2015-04-04 ENCOUNTER — Emergency Department (HOSPITAL_COMMUNITY)
Admission: EM | Admit: 2015-04-04 | Discharge: 2015-04-04 | Disposition: A | Discharge status: Home / Self Care | Payer: Medicare Other | Attending: Emergency Medicine | Admitting: Emergency Medicine

## 2015-04-04 ENCOUNTER — Encounter (HOSPITAL_COMMUNITY): Payer: Self-pay | Admitting: Emergency Medicine

## 2015-04-04 ENCOUNTER — Emergency Department (HOSPITAL_COMMUNITY): Payer: Medicare Other

## 2015-04-04 DIAGNOSIS — I2510 Atherosclerotic heart disease of native coronary artery without angina pectoris: Secondary | ICD-10-CM | POA: Diagnosis not present

## 2015-04-04 DIAGNOSIS — R5383 Other fatigue: Secondary | ICD-10-CM | POA: Diagnosis not present

## 2015-04-04 DIAGNOSIS — E785 Hyperlipidemia, unspecified: Secondary | ICD-10-CM | POA: Diagnosis not present

## 2015-04-04 DIAGNOSIS — R1084 Generalized abdominal pain: Secondary | ICD-10-CM | POA: Insufficient documentation

## 2015-04-04 DIAGNOSIS — R109 Unspecified abdominal pain: Secondary | ICD-10-CM | POA: Diagnosis present

## 2015-04-04 DIAGNOSIS — M810 Age-related osteoporosis without current pathological fracture: Secondary | ICD-10-CM | POA: Insufficient documentation

## 2015-04-04 DIAGNOSIS — Z79899 Other long term (current) drug therapy: Secondary | ICD-10-CM | POA: Diagnosis not present

## 2015-04-04 DIAGNOSIS — G8929 Other chronic pain: Secondary | ICD-10-CM | POA: Insufficient documentation

## 2015-04-04 DIAGNOSIS — K219 Gastro-esophageal reflux disease without esophagitis: Secondary | ICD-10-CM | POA: Insufficient documentation

## 2015-04-04 DIAGNOSIS — R110 Nausea: Secondary | ICD-10-CM | POA: Diagnosis not present

## 2015-04-04 DIAGNOSIS — I509 Heart failure, unspecified: Secondary | ICD-10-CM | POA: Diagnosis not present

## 2015-04-04 DIAGNOSIS — I10 Essential (primary) hypertension: Secondary | ICD-10-CM | POA: Diagnosis not present

## 2015-04-04 DIAGNOSIS — R630 Anorexia: Secondary | ICD-10-CM | POA: Diagnosis not present

## 2015-04-04 DIAGNOSIS — N281 Cyst of kidney, acquired: Secondary | ICD-10-CM | POA: Diagnosis not present

## 2015-04-04 HISTORY — DX: Heart failure, unspecified: I50.9

## 2015-04-04 LAB — I-STAT TROPONIN, ED
Troponin i, poc: 0 ng/mL (ref 0.00–0.08)
Troponin i, poc: 0.01 ng/mL (ref 0.00–0.08)

## 2015-04-04 LAB — CBC WITH DIFFERENTIAL/PLATELET
BASOS PCT: 0 % (ref 0–1)
Basophils Absolute: 0 10*3/uL (ref 0.0–0.1)
EOS ABS: 0.1 10*3/uL (ref 0.0–0.7)
Eosinophils Relative: 1 % (ref 0–5)
HCT: 41.2 % (ref 36.0–46.0)
HEMOGLOBIN: 14.4 g/dL (ref 12.0–15.0)
LYMPHS ABS: 1.1 10*3/uL (ref 0.7–4.0)
Lymphocytes Relative: 17 % (ref 12–46)
MCH: 29.1 pg (ref 26.0–34.0)
MCHC: 35 g/dL (ref 30.0–36.0)
MCV: 83.2 fL (ref 78.0–100.0)
Monocytes Absolute: 0.5 10*3/uL (ref 0.1–1.0)
Monocytes Relative: 7 % (ref 3–12)
NEUTROS ABS: 4.8 10*3/uL (ref 1.7–7.7)
Neutrophils Relative %: 75 % (ref 43–77)
Platelets: 265 10*3/uL (ref 150–400)
RBC: 4.95 MIL/uL (ref 3.87–5.11)
RDW: 13 % (ref 11.5–15.5)
WBC: 6.4 10*3/uL (ref 4.0–10.5)

## 2015-04-04 LAB — COMPREHENSIVE METABOLIC PANEL
ALK PHOS: 64 U/L (ref 38–126)
ALT: 18 U/L (ref 14–54)
ANION GAP: 12 (ref 5–15)
AST: 26 U/L (ref 15–41)
Albumin: 4.1 g/dL (ref 3.5–5.0)
BUN: 6 mg/dL (ref 6–20)
CALCIUM: 9.9 mg/dL (ref 8.9–10.3)
CHLORIDE: 103 mmol/L (ref 101–111)
CO2: 26 mmol/L (ref 22–32)
Creatinine, Ser: 0.8 mg/dL (ref 0.44–1.00)
GFR calc Af Amer: >60 mL/min (ref >60)
Glucose, Bld: 109 mg/dL — ABNORMAL HIGH (ref 65–99)
Potassium: 3.3 mmol/L — ABNORMAL LOW (ref 3.5–5.1)
Sodium: 141 mmol/L (ref 135–145)
Total Bilirubin: 0.3 mg/dL (ref 0.3–1.2)
Total Protein: 7.4 g/dL (ref 6.5–8.1)

## 2015-04-04 LAB — URINALYSIS, ROUTINE W REFLEX MICROSCOPIC
BILIRUBIN URINE: NEGATIVE
Glucose, UA: NEGATIVE mg/dL
Hgb urine dipstick: NEGATIVE
Ketones, ur: NEGATIVE mg/dL
LEUKOCYTES UA: NEGATIVE
NITRITE: NEGATIVE
Protein, ur: NEGATIVE mg/dL
Specific Gravity, Urine: 1.005 (ref 1.005–1.030)
Urobilinogen, UA: 0.2 mg/dL (ref 0.0–1.0)
pH: 8 (ref 5.0–8.0)

## 2015-04-04 LAB — I-STAT CHEM 8, ED
BUN: 6 mg/dL (ref 6–20)
CALCIUM ION: 1.15 mmol/L (ref 1.13–1.30)
Chloride: 103 mmol/L (ref 101–111)
Creatinine, Ser: 0.8 mg/dL (ref 0.44–1.00)
GLUCOSE: 108 mg/dL — AB (ref 65–99)
HEMATOCRIT: 46 % (ref 36.0–46.0)
HEMOGLOBIN: 15.6 g/dL — AB (ref 12.0–15.0)
POTASSIUM: 3.2 mmol/L — AB (ref 3.5–5.1)
SODIUM: 142 mmol/L (ref 135–145)
TCO2: 26 mmol/L (ref 0–100)

## 2015-04-04 LAB — I-STAT CG4 LACTIC ACID, ED
LACTIC ACID, VENOUS: 1.92 mmol/L (ref 0.5–2.0)
LACTIC ACID, VENOUS: 2.12 mmol/L — AB (ref 0.5–2.0)

## 2015-04-04 LAB — LIPASE, BLOOD: Lipase: 24 U/L (ref 22–51)

## 2015-04-04 LAB — CBG MONITORING, ED: Glucose-Capillary: 114 mg/dL — ABNORMAL HIGH (ref 65–99)

## 2015-04-04 MED ORDER — IOHEXOL 300 MG/ML  SOLN
50.0000 mL | Freq: Once | INTRAMUSCULAR | Status: AC | PRN
  Administered 2015-04-04: 50 mL via ORAL

## 2015-04-04 MED ORDER — FENTANYL CITRATE (PF) 100 MCG/2ML IJ SOLN
50.0000 ug | Freq: Once | INTRAMUSCULAR | Status: AC
  Administered 2015-04-04: 50 ug via INTRAVENOUS
  Filled 2015-04-04: qty 2

## 2015-04-04 MED ORDER — LORAZEPAM 0.5 MG PO TABS: 0.5000 mg | ORAL_TABLET | Freq: Three times a day (TID) | ORAL | Status: DC | PRN

## 2015-04-04 MED ORDER — MORPHINE SULFATE 4 MG/ML IJ SOLN
4.0000 mg | Freq: Once | INTRAMUSCULAR | Status: AC
  Administered 2015-04-04: 4 mg via INTRAVENOUS
  Filled 2015-04-04: qty 1

## 2015-04-04 MED ORDER — SODIUM CHLORIDE 0.9 % IV BOLUS (SEPSIS)
1000.0000 mL | Freq: Once | INTRAVENOUS | Status: AC
  Administered 2015-04-04: 1000 mL via INTRAVENOUS

## 2015-04-04 MED ORDER — ONDANSETRON HCL 4 MG/2ML IJ SOLN
4.0000 mg | Freq: Once | INTRAMUSCULAR | Status: AC
  Administered 2015-04-04: 4 mg via INTRAVENOUS
  Filled 2015-04-04: qty 2

## 2015-04-04 MED ORDER — IOHEXOL 300 MG/ML  SOLN
80.0000 mL | Freq: Once | INTRAMUSCULAR | Status: AC | PRN
  Administered 2015-04-04: 80 mL via INTRAVENOUS

## 2015-04-04 NOTE — Discharge Instructions (Signed)
Return here as needed.  Follow-up with your primary care doctor, increase her fluid intake and rest as much as possible °

## 2015-04-04 NOTE — ED Notes (Signed)
Pt from home with c/o "feeling sick yesterday and not eating for 3 days."  Pt believes it's related to her "pain patch."  Pt extremely fatigued and "I feel like I could scream, I feel so jittery inside, like my stomach." Pt reports "feeling sick" and being weak and "freezing."  Pt reports nausea without emesis and diarrhea.  Pt in NAD, A&O.

## 2015-04-04 NOTE — ED Provider Notes (Signed)
CSN: [id number]     Arrival date & time 04/04/15  0844 History   First MD Initiated Contact with Patient 04/04/15 [phone]     Chief Complaint  Patient presents with  . Abdominal Pain     (Consider location/radiation/quality/duration/timing/severity/associated sxs/prior Treatment) HPI Patient presents to the emergency department with generalized not feeling well over the last 3 days, has not been eating very well either.  Patient complains of nausea.  She states that she feels jittery and feels like she has hot and cold.  Patient states that it had any vomiting or diarrhea, chest pain, shortness of breath, headache, blurred vision, back pain, neck pain, dysuria, incontinence, numbness, hematuria, hematemesis, bloody stool or syncope.  The patient feels dehydrated.  Patient states nothing seems make her condition better or worse Past Medical History  Diagnosis Date  . Back pain   . Hypertension   . Reflux   . Coronary artery disease   . Hyperlipidemia   . Osteoarthritis   . Mitral valve prolapse   . Chronic pain   . Osteoporosis   . Cataracts, both eyes   . Pain management   . Chronic narcotic use   . CHF (congestive heart failure)    Past Surgical History  Procedure Laterality Date  . Appendectomy    . Abdominal hysterectomy    . Bladder surgery    . Shoulder surgery      Right   History reviewed. No pertinent family history. History  Substance Use Topics  . Smoking status: Never Smoker   . Smokeless tobacco: Not on file  . Alcohol Use: No   OB History    No data available     Review of Systems  All other systems negative except as documented in the HPI. All pertinent positives and negatives as reviewed in the HPI.  Allergies  Amitiza; Benadryl; Aspirin; Ibuprofen; and Sulfa antibiotics  Home Medications   Prior to Admission medications   Medication Sig Start Date End Date Taking? Authorizing Provider  Cholecalciferol (VITAMIN D PO) Take 2 tablets by mouth daily.    Yes Historical Provider, MD  Cyanocobalamin (B-12 PO) Take 1 tablet by mouth daily.   Yes Historical Provider, MD  diltiazem (TIAZAC) 360 MG 24 hr capsule Take 360 mg by mouth every evening.    Yes Historical Provider, MD  fentaNYL (DURAGESIC - DOSED MCG/HR) 100 MCG/HR Place 100 mcg onto the skin every other day.    Yes Historical Provider, MD  pantoprazole (PROTONIX) 40 MG tablet Take 40 mg by mouth every morning.    Yes Historical Provider, MD  simvastatin (ZOCOR) 20 MG tablet Take 20 mg by mouth every evening.   Yes Historical Provider, MD  triamterene-hydrochlorothiazide (DYAZIDE) 37.5-25 MG per capsule Take 1 capsule by mouth every morning.    Yes Historical Provider, MD  docusate sodium 100 MG CAPS Take 100 mg by mouth daily. Patient not taking: Reported on 04/04/2015 12/15/13   Renford Dills, MD  nitroGLYCERIN (NITROSTAT) 0.4 MG SL tablet Place 0.4 mg under the tongue every 5 (five) minutes as needed for chest pain.    Historical Provider, MD  ondansetron (ZOFRAN) 4 MG tablet Take 1 tablet (4 mg total) by mouth every 6 (six) hours. Patient not taking: Reported on 11/20/2014 08/13/14   Gilda Crease, MD  promethazine (PHENERGAN) 25 MG tablet Take 1 tablet (25 mg total) by mouth every 8 (eight) hours as needed for nausea or vomiting. Patient not taking: Reported on 04/04/2015 11/20/14  Rubyann Lingle, PA-C   BP 136/55 mmHg  Pulse 74  Temp(Src) 99.1 F (37.3 C) (Rectal)  Resp 12  SpO2 100% Physical Exam  Constitutional: She is oriented to person, place, and time. She appears well-developed and well-nourished. No distress.  HENT:  Head: Normocephalic and atraumatic.  Mouth/Throat: Oropharynx is clear and moist.  Eyes: Pupils are equal, round, and reactive to light.  Neck: Normal range of motion. Neck supple.  Cardiovascular: Normal rate, regular rhythm and normal heart sounds.  Exam reveals no gallop and no friction rub.   No murmur heard. Pulmonary/Chest: Effort normal and  breath sounds normal. No respiratory distress.  Abdominal: Soft. Bowel sounds are normal. She exhibits no distension. There is tenderness. There is no rebound and no guarding.  Musculoskeletal: She exhibits no edema.  Neurological: She is alert and oriented to person, place, and time. She exhibits normal muscle tone. Coordination normal.  Skin: Skin is warm and dry. No rash noted. No erythema.  Psychiatric: She has a normal mood and affect. Her behavior is normal.  Nursing note and vitals reviewed.   ED Course  Procedures (including critical care time) Labs Review Labs Reviewed  COMPREHENSIVE METABOLIC PANEL - Abnormal; Notable for the following:    Potassium 3.3 (*)    Glucose, Bld 109 (*)    All other components within normal limits  CBG MONITORING, ED - Abnormal; Notable for the following:    Glucose-Capillary 114 (*)    All other components within normal limits  I-STAT CHEM 8, ED - Abnormal; Notable for the following:    Potassium 3.2 (*)    Glucose, Bld 108 (*)    Hemoglobin 15.6 (*)    All other components within normal limits  I-STAT CG4 LACTIC ACID, ED - Abnormal; Notable for the following:    Lactic Acid, Venous 2.12 (*)    All other components within normal limits  CBC WITH DIFFERENTIAL/PLATELET  LIPASE, BLOOD  URINALYSIS, ROUTINE W REFLEX MICROSCOPIC (NOT AT [facility])  I-STAT TROPOININ, ED  I-STAT CG4 LACTIC ACID, ED  I-STAT TROPOININ, ED    Imaging Review Ct Abdomen Pelvis W Contrast  04/04/2015   CLINICAL DATA:  Fatigue. Feeling sick. Anorexia. Nausea without emesis or diarrhea.  EXAM: CT ABDOMEN AND PELVIS WITH CONTRAST  TECHNIQUE: Multidetector CT imaging of the abdomen and pelvis was performed using the standard protocol following bolus administration of intravenous contrast.  CONTRAST:  80mL OMNIPAQUE IOHEXOL 300 MG/ML  SOLN  COMPARISON:  11/20/2014  FINDINGS: Lower chest: 5 mm subpleural nodule is identified in the left base, image 13/series 3. Unchanged from  previous exam.  Hepatobiliary: Aortic atherosclerosis noted. No suspicious liver abnormalities identified. The gallbladder appears normal. There is no biliary dilatation.  Pancreas: Negative .  Spleen: Negative  Adrenals/Urinary Tract: The adrenal glands are normal. Right kidney malrotated. Bilateral renal cysts. No obstructive uropathy. Urinary bladder appears normal.  Stomach/Bowel: The stomach is normal. The small bowel loops have a normal course and caliber. No obstruction. The colon is on unremarkable.  Vascular/Lymphatic: Calcified atherosclerotic disease involves the abdominal aorta. No aneurysm. No enlarged retroperitoneal or mesenteric adenopathy. No enlarged pelvic or inguinal lymph nodes.  Reproductive: Previous hysterectomy.  No adnexal mass.  Other: There is no ascites or focal fluid collections within the abdomen or pelvis.  Musculoskeletal: No aggressive lytic or sclerotic bone lesions. Anterolisthesis of L3 on L4 and L4 on L5 is identified. There is degenerative disc disease at L4-5 and L5-S1. Scoliosis deformity is noted involving the  thoracic and lumbar spine.  IMPRESSION: 1. No acute findings within the abdomen or pelvis. No explanation for patient's pain. 2. Aortic atherosclerosis 3. Renal cysts. 4. Malrotated right kidney. 5. Scoliosis and lumbar spondylosis.   Electronically Signed   By: Signa Kell M.D.   On: 04/04/2015 12:17   Dg Chest Portable 1 View  04/04/2015   CLINICAL DATA:  No appetite for 3 days. Nausea but no vomiting. Fatigue and jittery. Patient believes symptoms are the result of her pain patch. History of hypertension, CAD, mitral prolapse, chronic pain, chronic narcotic use.  EXAM: PORTABLE CHEST - 1 VIEW  COMPARISON:  08/13/2014  FINDINGS: The heart size and mediastinal contours are within normal limits. Both lungs are clear. Previous right shoulder arthroplasty.  IMPRESSION: No active disease.   Electronically Signed   By: Norva Pavlov M.D.   On: 04/04/2015 09:53     The patient has had similar episodes in the past at the same complaint and been evaluated in the emergency department.  Patient is stable here today.  Her vital signs remained stable.  She is not febrile.  The patient will be discharged home and advised follow-up with her primary care doctor to increase her fluid intake and rest as much as possible  Charlestine Night, PA-C 04/04/15 1442  Benjiman Core, MD 04/04/15 [phone]

## 2015-04-26 DIAGNOSIS — M47812 Spondylosis without myelopathy or radiculopathy, cervical region: Secondary | ICD-10-CM | POA: Diagnosis not present

## 2015-04-26 DIAGNOSIS — G894 Chronic pain syndrome: Secondary | ICD-10-CM | POA: Diagnosis not present

## 2015-04-26 DIAGNOSIS — Z79891 Long term (current) use of opiate analgesic: Secondary | ICD-10-CM | POA: Diagnosis not present

## 2015-04-26 DIAGNOSIS — M47816 Spondylosis without myelopathy or radiculopathy, lumbar region: Secondary | ICD-10-CM | POA: Diagnosis not present

## 2015-04-26 DIAGNOSIS — M4806 Spinal stenosis, lumbar region: Secondary | ICD-10-CM | POA: Diagnosis not present

## 2015-05-17 ENCOUNTER — Ambulatory Visit: Payer: Medicare Other | Admitting: Internal Medicine

## 2015-05-31 DIAGNOSIS — M47812 Spondylosis without myelopathy or radiculopathy, cervical region: Secondary | ICD-10-CM | POA: Diagnosis not present

## 2015-05-31 DIAGNOSIS — G894 Chronic pain syndrome: Secondary | ICD-10-CM | POA: Diagnosis not present

## 2015-05-31 DIAGNOSIS — M47816 Spondylosis without myelopathy or radiculopathy, lumbar region: Secondary | ICD-10-CM | POA: Diagnosis not present

## 2015-05-31 DIAGNOSIS — M4806 Spinal stenosis, lumbar region: Secondary | ICD-10-CM | POA: Diagnosis not present

## 2015-06-03 ENCOUNTER — Other Ambulatory Visit: Payer: Self-pay | Admitting: Physical Medicine and Rehabilitation

## 2015-06-03 DIAGNOSIS — M545 Low back pain: Secondary | ICD-10-CM

## 2015-06-08 ENCOUNTER — Ambulatory Visit: Payer: Medicare Other | Admitting: Internal Medicine

## 2015-06-10 ENCOUNTER — Other Ambulatory Visit: Payer: Medicare Other

## 2015-06-11 ENCOUNTER — Other Ambulatory Visit: Payer: Medicare Other

## 2015-06-17 ENCOUNTER — Encounter: Payer: Self-pay | Admitting: Internal Medicine

## 2015-06-17 ENCOUNTER — Ambulatory Visit (INDEPENDENT_AMBULATORY_CARE_PROVIDER_SITE_OTHER): Payer: Medicare Other | Admitting: Internal Medicine

## 2015-06-17 VITALS — BP 130/66 | HR 62 | Temp 98.2°F | Resp 12 | Ht 59.0 in | Wt 110.1 lb

## 2015-06-17 DIAGNOSIS — M549 Dorsalgia, unspecified: Secondary | ICD-10-CM

## 2015-06-17 DIAGNOSIS — Z23 Encounter for immunization: Secondary | ICD-10-CM

## 2015-06-17 DIAGNOSIS — G8929 Other chronic pain: Secondary | ICD-10-CM

## 2015-06-17 DIAGNOSIS — G4700 Insomnia, unspecified: Secondary | ICD-10-CM | POA: Diagnosis not present

## 2015-06-17 DIAGNOSIS — I10 Essential (primary) hypertension: Secondary | ICD-10-CM | POA: Diagnosis not present

## 2015-06-17 MED ORDER — LORAZEPAM 0.5 MG PO TABS: 0.5000 mg | ORAL_TABLET | Freq: Three times a day (TID) | ORAL | Status: DC | PRN

## 2015-06-17 NOTE — Progress Notes (Addendum)
   Subjective:    Patient ID: Claudia [name], female    DOB: 28-Sep-1931, 81 y.o.   MRN: [id number]  HPI The patient is an 81 YO female coming in new for insomnia. She had been seen in the ER and tried on lorazepam for sleep which was very effective. She has been using since that time with good success. Denies any side effects such as drowsiness or confusion. Takes care of her granddaughter (legal guardian of her) with mental impairment which does cause her some stress. Overall a very happy person and denies depression or sadness. No guilt and able to enjoy happy things and fun activities.   PMH, [facility], social history reviewed and updated.   Review of Systems  Constitutional: Negative for fever, activity change, appetite change, fatigue and unexpected weight change.  HENT: Negative.   Eyes: Negative.   Respiratory: Negative for cough, chest tightness, shortness of breath and wheezing.   Cardiovascular: Negative for chest pain, palpitations and leg swelling.  Gastrointestinal: Negative for nausea, abdominal pain, diarrhea, constipation and abdominal distention.  Musculoskeletal: Positive for back pain and arthralgias.  Skin: Negative.   Neurological: Negative.   Psychiatric/Behavioral: Positive for sleep disturbance. Negative for suicidal ideas, confusion, self-injury, dysphoric mood and decreased concentration. The patient is not nervous/anxious.       Objective:   Physical Exam  Constitutional: She is oriented to person, place, and time. She appears well-developed and well-nourished.  HENT:  Head: Normocephalic and atraumatic.  Eyes: EOM are normal.  Neck: Normal range of motion.  Cardiovascular: Normal rate and regular rhythm.   No murmur heard. Pulmonary/Chest: Effort normal and breath sounds normal. No respiratory distress. She has no wheezes. She has no rales.  Abdominal: Soft. Bowel sounds are normal. She exhibits no distension. There is no tenderness. There is no rebound.    Musculoskeletal: She exhibits no edema.  Neurological: She is alert and oriented to person, place, and time. Coordination normal.  Skin: Skin is warm and dry.  Psychiatric: She has a normal mood and affect.   Filed Vitals:   06/17/15 1039  BP: 130/66  Pulse: 62  Temp: 98.2 F (36.8 C)  TempSrc: Oral  Resp: 12  Height:  (1.499 m)  Weight: 110 lb 1.9 oz (49.95 kg)  SpO2: 98%      Assessment & Plan:  Flu shot given at visit.

## 2015-06-17 NOTE — Progress Notes (Signed)
Pre visit review using our clinic review tool, if applicable. No additional management support is needed unless otherwise documented below in the visit note. 

## 2015-06-17 NOTE — Patient Instructions (Signed)
We have given you the refill of the medicine.   We have given you the flu shot today and can see you back for the wellness visit in about 6 months and can check blood work then.

## 2015-06-19 DIAGNOSIS — G4700 Insomnia, unspecified: Secondary | ICD-10-CM | POA: Insufficient documentation

## 2015-06-19 NOTE — Assessment & Plan Note (Signed)
Ok with using low dose lorazepam for sleep. Doing well. Talked to her about the long term lack of safety and evidence of harm with increased falls, memory impairment. At this time she elects that her QOL is much improved with being able to sleep and accepts those risks.

## 2015-06-19 NOTE — Assessment & Plan Note (Signed)
Controlled today on diltiazem and hctz. Checking labs today and adjust as needed.

## 2015-06-19 NOTE — Assessment & Plan Note (Signed)
Seeing pain management with neuro for her fentanyl. Not satisfied with her current level of control of pain. No new or worsening neurological symptoms.

## 2015-06-29 ENCOUNTER — Telehealth: Payer: Self-pay | Admitting: Internal Medicine

## 2015-06-29 DIAGNOSIS — M4806 Spinal stenosis, lumbar region: Secondary | ICD-10-CM | POA: Diagnosis not present

## 2015-06-29 DIAGNOSIS — M47812 Spondylosis without myelopathy or radiculopathy, cervical region: Secondary | ICD-10-CM | POA: Diagnosis not present

## 2015-06-29 DIAGNOSIS — M47816 Spondylosis without myelopathy or radiculopathy, lumbar region: Secondary | ICD-10-CM | POA: Diagnosis not present

## 2015-06-29 DIAGNOSIS — G894 Chronic pain syndrome: Secondary | ICD-10-CM | POA: Diagnosis not present

## 2015-06-29 NOTE — Telephone Encounter (Signed)
Rec'd records from [place] at [place]Forwarding 38 page's to Dr.Kollar

## 2015-07-22 ENCOUNTER — Emergency Department (HOSPITAL_COMMUNITY): Payer: Medicare Other

## 2015-07-22 ENCOUNTER — Emergency Department (HOSPITAL_COMMUNITY)
Admission: EM | Admit: 2015-07-22 | Discharge: 2015-07-22 | Disposition: A | Discharge status: Home / Self Care | Payer: Medicare Other | Attending: Emergency Medicine | Admitting: Emergency Medicine

## 2015-07-22 ENCOUNTER — Encounter (HOSPITAL_COMMUNITY): Payer: Self-pay | Admitting: Emergency Medicine

## 2015-07-22 DIAGNOSIS — G8929 Other chronic pain: Secondary | ICD-10-CM | POA: Insufficient documentation

## 2015-07-22 DIAGNOSIS — M25512 Pain in left shoulder: Secondary | ICD-10-CM | POA: Diagnosis not present

## 2015-07-22 DIAGNOSIS — K219 Gastro-esophageal reflux disease without esophagitis: Secondary | ICD-10-CM | POA: Diagnosis not present

## 2015-07-22 DIAGNOSIS — R030 Elevated blood-pressure reading, without diagnosis of hypertension: Secondary | ICD-10-CM | POA: Diagnosis not present

## 2015-07-22 DIAGNOSIS — E785 Hyperlipidemia, unspecified: Secondary | ICD-10-CM | POA: Insufficient documentation

## 2015-07-22 DIAGNOSIS — I509 Heart failure, unspecified: Secondary | ICD-10-CM | POA: Diagnosis not present

## 2015-07-22 DIAGNOSIS — I2510 Atherosclerotic heart disease of native coronary artery without angina pectoris: Secondary | ICD-10-CM | POA: Insufficient documentation

## 2015-07-22 DIAGNOSIS — Z79899 Other long term (current) drug therapy: Secondary | ICD-10-CM | POA: Insufficient documentation

## 2015-07-22 DIAGNOSIS — Z8739 Personal history of other diseases of the musculoskeletal system and connective tissue: Secondary | ICD-10-CM | POA: Insufficient documentation

## 2015-07-22 DIAGNOSIS — I10 Essential (primary) hypertension: Secondary | ICD-10-CM | POA: Diagnosis not present

## 2015-07-22 DIAGNOSIS — H269 Unspecified cataract: Secondary | ICD-10-CM | POA: Insufficient documentation

## 2015-07-22 LAB — BASIC METABOLIC PANEL
ANION GAP: 7 (ref 5–15)
BUN: 14 mg/dL (ref 6–20)
CALCIUM: 9.6 mg/dL (ref 8.9–10.3)
CO2: 33 mmol/L — ABNORMAL HIGH (ref 22–32)
Chloride: 100 mmol/L — ABNORMAL LOW (ref 101–111)
Creatinine, Ser: 0.84 mg/dL (ref 0.44–1.00)
Glucose, Bld: 117 mg/dL — ABNORMAL HIGH (ref 65–99)
POTASSIUM: 3.1 mmol/L — AB (ref 3.5–5.1)
Sodium: 140 mmol/L (ref 135–145)

## 2015-07-22 LAB — CBC WITH DIFFERENTIAL/PLATELET
BASOS ABS: 0 10*3/uL (ref 0.0–0.1)
BASOS PCT: 0 %
EOS PCT: 1 %
Eosinophils Absolute: 0 10*3/uL (ref 0.0–0.7)
HCT: 38.8 % (ref 36.0–46.0)
Hemoglobin: 13.1 g/dL (ref 12.0–15.0)
LYMPHS PCT: 11 %
Lymphs Abs: 0.7 10*3/uL (ref 0.7–4.0)
MCH: 29.1 pg (ref 26.0–34.0)
MCHC: 33.8 g/dL (ref 30.0–36.0)
MCV: 86.2 fL (ref 78.0–100.0)
MONO ABS: 0.3 10*3/uL (ref 0.1–1.0)
Monocytes Relative: 5 %
Neutro Abs: 5.4 10*3/uL (ref 1.7–7.7)
Neutrophils Relative %: 83 %
PLATELETS: 236 10*3/uL (ref 150–400)
RBC: 4.5 MIL/uL (ref 3.87–5.11)
RDW: 13 % (ref 11.5–15.5)
WBC: 6.5 10*3/uL (ref 4.0–10.5)

## 2015-07-22 LAB — TROPONIN I

## 2015-07-22 NOTE — ED Notes (Signed)
Pt refused CT scan

## 2015-07-22 NOTE — ED Notes (Addendum)
Pt reports BP has been elevated for the past week. Pt took BP at home which showed BP of 190/113. Took home BP meds at 0630 this am. BP during triage 111/61. Pt reports she has been stumbling for the past week, no falls. Denies CP. Pt also reports L shoulder pain that is unusual for her.

## 2015-07-22 NOTE — ED Notes (Signed)
Pt states she changed her mind after speaking with her daughter, she now wants the CT scan. Patient transported to CT, MD aware.

## 2015-07-22 NOTE — ED Provider Notes (Signed)
CSN: [id number]     Arrival date & time 07/22/15  0815 History   First MD Initiated Contact with Patient 07/22/15 [phone]     Chief Complaint  Patient presents with  . Hypertension     (Consider location/radiation/quality/duration/timing/severity/associated sxs/prior Treatment) HPI Comments: Patient presents to the ER for evaluation of elevated blood pressure. Patient reports that she checks her blood pressure daily. Over the last week she has noticed progressive increase in her blood pressure. She reports that her normal morning blood pressure is around 130/80. This morning she was 190/113. She took her home blood pressure medications prior to coming to the ER.  Patient denies any active pain. She does report that she has been experiencing an unusual sensation in the area of her left shoulder. She thought it was arthritis. She also noticed that she has been unsteady on her feet for the last week.   Past Medical History  Diagnosis Date  . Back pain   . Hypertension   . Reflux   . Coronary artery disease   . Hyperlipidemia   . Osteoarthritis   . Mitral valve prolapse   . Chronic pain   . Osteoporosis   . Cataracts, both eyes   . Pain management   . Chronic narcotic use   . CHF (congestive heart failure) [employer])    Past Surgical History  Procedure Laterality Date  . Appendectomy    . Abdominal hysterectomy    . Bladder surgery    . Shoulder surgery      Right  . Breast surgery     Family History  Problem Relation Age of Onset  . Hypertension Mother   . Diabetes Mother   . Heart disease Father   . Hypertension Father   . Diabetes Father    Social History  Substance Use Topics  . Smoking status: Never Smoker   . Smokeless tobacco: None  . Alcohol Use: No   OB History    No data available     Review of Systems  Respiratory: Negative for shortness of breath.   Musculoskeletal: Positive for arthralgias.  All other systems reviewed and are negative.     Allergies   Amitiza; Benadryl; Aspirin; Ibuprofen; and Sulfa antibiotics  Home Medications   Prior to Admission medications   Medication Sig Start Date End Date Taking? Authorizing Provider  Cyanocobalamin (B-12 PO) Take 1 tablet by mouth daily.   Yes Historical Provider, MD  diltiazem (TIAZAC) 360 MG 24 hr capsule Take 360 mg by mouth every evening.    Yes Historical Provider, MD  docusate sodium 100 MG CAPS Take 100 mg by mouth daily. 12/15/13  Yes Renford Dills, MD  fentaNYL (DURAGESIC - DOSED MCG/HR) 100 MCG/HR Place 100 mcg onto the skin every other day.    Yes Historical Provider, MD  LORazepam (ATIVAN) 0.5 MG tablet Take 1 tablet (0.5 mg total) by mouth every 8 (eight) hours as needed for anxiety. 06/17/15  Yes [name], MD  pantoprazole (PROTONIX) 40 MG tablet Take 40 mg by mouth every morning.    Yes Historical Provider, MD  simvastatin (ZOCOR) 20 MG tablet Take 20 mg by mouth every evening.   Yes Historical Provider, MD  triamterene-hydrochlorothiazide (DYAZIDE) 37.5-25 MG per capsule Take 1 capsule by mouth every morning.    Yes Historical Provider, MD  nitroGLYCERIN (NITROSTAT) 0.4 MG SL tablet Place 0.4 mg under the tongue every 5 (five) minutes as needed for chest pain.    Historical Provider, MD  BP 111/61 mmHg  Pulse 90  Temp(Src) 98.5 F (36.9 C) (Oral)  Resp 16  SpO2 100% Physical Exam  Constitutional: She is oriented to person, place, and time. She appears well-developed and well-nourished. No distress.  HENT:  Head: Normocephalic and atraumatic.  Right Ear: Hearing normal.  Left Ear: Hearing normal.  Nose: Nose normal.  Mouth/Throat: Oropharynx is clear and moist and mucous membranes are normal.  Eyes: Conjunctivae and EOM are normal. Pupils are equal, round, and reactive to light.  Neck: Normal range of motion. Neck supple.  Cardiovascular: Regular rhythm, S1 normal and S2 normal.  Exam reveals no gallop and no friction rub.   No murmur heard. Pulmonary/Chest:  Effort normal and breath sounds normal. No respiratory distress. She exhibits no tenderness.  Abdominal: Soft. Normal appearance and bowel sounds are normal. There is no hepatosplenomegaly. There is no tenderness. There is no rebound, no guarding, no tenderness at McBurney's point and negative Murphy's sign. No hernia.  Musculoskeletal: Normal range of motion.  Neurological: She is alert and oriented to person, place, and time. She has normal strength. No cranial nerve deficit or sensory deficit. Coordination normal. GCS eye subscore is 4. GCS verbal subscore is 5. GCS motor subscore is 6.  Skin: Skin is warm, dry and intact. No rash noted. No cyanosis.  Psychiatric: She has a normal mood and affect. Her speech is normal and behavior is normal. Thought content normal.  Nursing note and vitals reviewed.   ED Course  Procedures (including critical care time) Labs Review Labs Reviewed  CBC WITH DIFFERENTIAL/PLATELET  BASIC METABOLIC PANEL  TROPONIN I    Imaging Review No results found. I have personally reviewed and evaluated these images and lab results as part of my medical decision-making.   EKG Interpretation   Date/Time:  Thursday July 22 2015 08:37:32 EDT Ventricular Rate:  86 PR Interval:  145 QRS Duration: 94 QT Interval:  386 QTC Calculation: 462 R Axis:   -44 Text Interpretation:  Sinus rhythm Left axis deviation Abnormal R-wave  progression, early transition Confirmed by POLLINA  MD, CHRISTOPHER  ([id number]) on 07/22/2015 8:41:49 AM      MDM   Final diagnoses:  None   hypertension  Presents to the emergency department for evaluation of blood pressure. Patient reports that she does have a history of hypertension, usually well controlled. She has been checking her blood pressure this week and it has been elevated. He was most elevated this morning, presented to the ER for further evaluation. Patient reports that she has been having some imbalance when she walks. She  did not have any focal neurologic findings on examination today. CT head was unremarkable. Patient also reported strange sensation in the area of her left shoulder that she cannot qualify or quantify. Her EKG does not show any evidence of ischemia or infarct. Patient also had 2 negative troponins. Her blood pressure has been normal or slightly elevated here in the ER. Will refer her back to her primary doctor for further blood pressure monitoring.    Gilda Creasehristopher J Pollina, MD 07/22/15 1329

## 2015-07-22 NOTE — Discharge Instructions (Signed)
Hypertension Hypertension, commonly called high blood pressure, is when the force of blood pumping through your arteries is too strong. Your arteries are the blood vessels that carry blood from your heart throughout your body. A blood pressure reading consists of a higher number over a lower number, such as 110/72. The higher number (systolic) is the pressure inside your arteries when your heart pumps. The lower number (diastolic) is the pressure inside your arteries when your heart relaxes. Ideally you want your blood pressure below 120/80. Hypertension forces your heart to work harder to pump blood. Your arteries may become narrow or stiff. Having untreated or uncontrolled hypertension can cause heart attack, stroke, kidney disease, and other problems. RISK FACTORS Some risk factors for high blood pressure are controllable. Others are not.  Risk factors you cannot control include:   Race. You may be at higher risk if you are African American.  Age. Risk increases with age.  Gender. Men are at higher risk than women before age 45 years. After age 65, women are at higher risk than men. Risk factors you can control include:  Not getting enough exercise or physical activity.  Being overweight.  Getting too much fat, sugar, calories, or salt in your diet.  Drinking too much alcohol. SIGNS AND SYMPTOMS Hypertension does not usually cause signs or symptoms. Extremely high blood pressure (hypertensive crisis) may cause headache, anxiety, shortness of breath, and nosebleed. DIAGNOSIS To check if you have hypertension, your health care provider will measure your blood pressure while you are seated, with your arm held at the level of your heart. It should be measured at least twice using the same arm. Certain conditions can cause a difference in blood pressure between your right and left arms. A blood pressure reading that is higher than normal on one occasion does not mean that you need treatment. If  it is not clear whether you have high blood pressure, you may be asked to return on a different day to have your blood pressure checked again. Or, you may be asked to monitor your blood pressure at home for 1 or more weeks. TREATMENT Treating high blood pressure includes making lifestyle changes and possibly taking medicine. Living a healthy lifestyle can help lower high blood pressure. You may need to change some of your habits. Lifestyle changes may include:  Following the DASH diet. This diet is high in fruits, vegetables, and whole grains. It is low in salt, red meat, and added sugars.  Keep your sodium intake below 2,300 mg per day.  Getting at least 30-45 minutes of aerobic exercise at least 4 times per week.  Losing weight if necessary.  Not smoking.  Limiting alcoholic beverages.  Learning ways to reduce stress. Your health care provider may prescribe medicine if lifestyle changes are not enough to get your blood pressure under control, and if one of the following is true:  You are 18-59 years of age and your systolic blood pressure is above 140.  You are 60 years of age or older, and your systolic blood pressure is above 150.  Your diastolic blood pressure is above 90.  You have diabetes, and your systolic blood pressure is over 140 or your diastolic blood pressure is over 90.  You have kidney disease and your blood pressure is above 140/90.  You have heart disease and your blood pressure is above 140/90. Your personal target blood pressure may vary depending on your medical conditions, your age, and other factors. HOME CARE INSTRUCTIONS    Have your blood pressure rechecked as directed by your health care provider.   Take medicines only as directed by your health care provider. Follow the directions carefully. Blood pressure medicines must be taken as prescribed. The medicine does not work as well when you skip doses. Skipping doses also puts you at risk for  problems.  Do not smoke.   Monitor your blood pressure at home as directed by your health care provider. SEEK MEDICAL CARE IF:   You think you are having a reaction to medicines taken.  You have recurrent headaches or feel dizzy.  You have swelling in your ankles.  You have trouble with your vision. SEEK IMMEDIATE MEDICAL CARE IF:  You develop a severe headache or confusion.  You have unusual weakness, numbness, or feel faint.  You have severe chest or abdominal pain.  You vomit repeatedly.  You have trouble breathing. MAKE SURE YOU:   Understand these instructions.  Will watch your condition.  Will get help right away if you are not doing well or get worse.   This information is not intended to replace advice given to you by your health care provider. Make sure you discuss any questions you have with your health care provider.   Document Released: 09/25/2005 Document Revised: 02/09/2015 Document Reviewed: 07/18/2013 Elsevier Interactive Patient Education 2016 [employer]  

## 2015-07-26 ENCOUNTER — Ambulatory Visit (INDEPENDENT_AMBULATORY_CARE_PROVIDER_SITE_OTHER): Payer: Medicare Other | Admitting: Internal Medicine

## 2015-07-26 ENCOUNTER — Other Ambulatory Visit (INDEPENDENT_AMBULATORY_CARE_PROVIDER_SITE_OTHER): Payer: Medicare Other

## 2015-07-26 ENCOUNTER — Encounter: Payer: Self-pay | Admitting: Internal Medicine

## 2015-07-26 VITALS — BP 136/70 | HR 60 | Temp 98.3°F | Resp 12 | Ht 59.0 in | Wt 110.1 lb

## 2015-07-26 DIAGNOSIS — I10 Essential (primary) hypertension: Secondary | ICD-10-CM

## 2015-07-26 LAB — MAGNESIUM: Magnesium: 2.1 mg/dL (ref 1.5–2.5)

## 2015-07-26 LAB — BASIC METABOLIC PANEL
BUN: 12 mg/dL (ref 6–23)
CHLORIDE: 99 meq/L (ref 96–112)
CO2: 34 meq/L — AB (ref 19–32)
CREATININE: 0.91 mg/dL (ref 0.40–1.20)
Calcium: 9.7 mg/dL (ref 8.4–10.5)
GFR: 62.8 mL/min (ref >60.00)
Glucose, Bld: 100 mg/dL — ABNORMAL HIGH (ref 70–99)
POTASSIUM: 3.6 meq/L (ref 3.5–5.1)
Sodium: 141 mEq/L (ref 135–145)

## 2015-07-26 MED ORDER — CLONIDINE HCL 0.1 MG PO TABS: 0.1000 mg | ORAL_TABLET | Freq: Every day | ORAL | Status: DC | PRN

## 2015-07-26 NOTE — Patient Instructions (Signed)
We have sent in a medicine for the blood pressure which you can use only when it is high. It is called clonidine and take 1 pill if needed. We want you to use it if the top number on the blood pressure is more than 175 or if the bottom number is more than 100.   If you are needing this more than 3-4 times per week call us and we may just add a medicine daily so you do not have to use that as often.

## 2015-07-26 NOTE — Progress Notes (Signed)
Pre visit review using our clinic review tool, if applicable. No additional management support is needed unless otherwise documented below in the visit note. 

## 2015-07-26 NOTE — Progress Notes (Signed)
   Subjective:    Patient ID: Claudia [name], female    DOB: 28-May-1931, 81 y.o.   MRN: [id number]  HPI The patient is an 81 YO female coming in for ER follow up (seen for hypertension, BP normal in the ER and negative head CT looking for stroke and 2 negative troponins and EKG without changes). She is doing well at home but still having some elevated BPs. She notices that several days per week she wakes up with high blood pressure. She gets feeling slightly dizzy during that time. This feeling does pass as her blood pressure goes down.   Review of Systems  Constitutional: Negative for fever, activity change, appetite change, fatigue and unexpected weight change.  HENT: Negative.   Eyes: Negative.   Respiratory: Negative for cough, chest tightness, shortness of breath and wheezing.   Cardiovascular: Negative for chest pain, palpitations and leg swelling.  Gastrointestinal: Negative for nausea, abdominal pain, diarrhea, constipation and abdominal distention.  Musculoskeletal: Positive for arthralgias. Negative for back pain.  Skin: Negative.   Neurological: Negative.   Psychiatric/Behavioral: Positive for sleep disturbance. Negative for suicidal ideas, confusion, self-injury, dysphoric mood and decreased concentration. The patient is not nervous/anxious.       Objective:   Physical Exam  Constitutional: She is oriented to person, place, and time. She appears well-developed and well-nourished.  HENT:  Head: Normocephalic and atraumatic.  Eyes: EOM are normal.  Neck: Normal range of motion.  Cardiovascular: Normal rate and regular rhythm.   No murmur heard. Pulmonary/Chest: Effort normal and breath sounds normal. No respiratory distress. She has no wheezes. She has no rales.  Abdominal: Soft. Bowel sounds are normal. She exhibits no distension. There is no tenderness. There is no rebound.  Musculoskeletal: She exhibits no edema.  Neurological: She is alert and oriented to person, place,  and time. Coordination normal.  Skin: Skin is warm and dry.  Psychiatric: She has a normal mood and affect.   Filed Vitals:   07/26/15 1038  BP: 136/70  Pulse: 60  Temp: 98.3 F (36.8 C)  TempSrc: Oral  Resp: 12  Height: 4\' 11"  (1.499 m)  Weight: 110 lb 1.9 oz (49.95 kg)  SpO2: 98%      Assessment & Plan:

## 2015-07-26 NOTE — Assessment & Plan Note (Addendum)
BP normal today and elevated occasionally at home. Rx for clonidine to be taken prn for systolic >175 or diastolic >100. If she is using more than 3 times per week she will call back and we will adjust her medication regimen. Is already on 2 drug regimen with diltiazem and hctz. Checking BMP and magnesium as she had low potassium at last ER visit.

## 2015-07-29 DIAGNOSIS — M47812 Spondylosis without myelopathy or radiculopathy, cervical region: Secondary | ICD-10-CM | POA: Diagnosis not present

## 2015-07-29 DIAGNOSIS — G894 Chronic pain syndrome: Secondary | ICD-10-CM | POA: Diagnosis not present

## 2015-07-29 DIAGNOSIS — M4806 Spinal stenosis, lumbar region: Secondary | ICD-10-CM | POA: Diagnosis not present

## 2015-07-29 DIAGNOSIS — M47816 Spondylosis without myelopathy or radiculopathy, lumbar region: Secondary | ICD-10-CM | POA: Diagnosis not present

## 2015-08-17 ENCOUNTER — Telehealth: Payer: Self-pay

## 2015-08-17 NOTE — Telephone Encounter (Signed)
Call back from Ms. Tritschler; called to make apt and scheduled for 1/11 at 10am and will bring dtr, disabled who is on medicare as well.

## 2015-08-17 NOTE — Telephone Encounter (Signed)
Outreach to discuss AWV; TCB if she can come in and complete or answer her questions regarding this visit.

## 2015-08-20 ENCOUNTER — Ambulatory Visit (INDEPENDENT_AMBULATORY_CARE_PROVIDER_SITE_OTHER): Payer: Medicare Other | Admitting: Internal Medicine

## 2015-08-20 ENCOUNTER — Ambulatory Visit: Payer: Medicare Other

## 2015-08-20 ENCOUNTER — Ambulatory Visit (INDEPENDENT_AMBULATORY_CARE_PROVIDER_SITE_OTHER): Payer: Medicare Other

## 2015-08-20 ENCOUNTER — Encounter: Payer: Self-pay | Admitting: Internal Medicine

## 2015-08-20 VITALS — BP 140/70 | HR 64 | Temp 98.3°F | Ht 59.0 in | Wt 113.0 lb

## 2015-08-20 DIAGNOSIS — Z0000 Encounter for general adult medical examination without abnormal findings: Secondary | ICD-10-CM

## 2015-08-20 MED ORDER — DILTIAZEM HCL ER 180 MG PO CP24: 360.0000 mg | ORAL_CAPSULE | Freq: Every day | ORAL | Status: DC

## 2015-08-20 NOTE — Progress Notes (Signed)
Medical screening examination/treatment/procedure(s) were performed by non-physician practitioner and as supervising physician I was immediately available for consultation/collaboration. I agree with above. Jenavi Beedle A Odie Rauen, MD 

## 2015-08-20 NOTE — Progress Notes (Signed)
Subjective:   Claudia [name] is a 81 y.o. female who presents for Medicare Annual (Subsequent) preventive examination.  Review of Systems:  HRA assessment completed during visit;  The Patient was informed that this wellness visit is to identify risk and educate on how to reduce risk for increase disease through lifestyle changes.   ROS deferred to next OV with physician  Has 5 children; 3 sons and 2 dtr; Mother and father both had DM / father died at 84; Mother lived long life  Referral to be determined [name] - can't afford MRI as 20%; the patient instructed that MRI order was placed at [facility]ia [name] and she can call and make apt. Would like to know if she needs the MRI, compared to the last MRI; (did not see other MRI in information sent; would assist in furhter dx pain in lower back which she states radiates to left hip.   Referral audiology discussed and deferred;   Medical issues; chronic back pain/ insomnia/  HTN; states it has been stable at home and has not needed to take the clonidine/ Has not been determined;  BMI: 22.8 Diet; doesn't have much of an appetite; Pain [name] meds decreases her appetite States she has lost weight; 113 and was 140; Now she is comfortable with her current weight Bowl of cereal; lunch; sometimes she gets a sandwich;  Supper; sandwich or other Recommended she eat or supplement her meals if she does not eat at least 3 times per day; Educated to eat something; peanut butter cracker or other or to try boost or muscle milk.   Exercise; can't use yard due to dogs; afraid neighbor's dog will come through the fence; Given resources for Claudia [name] which may can provide legal assistance regarding this matter  SAFETY is an issue; had to put fence around the house; Neighbor has pit bull dogs and she is "afraid of these dogs". Has spoken to the neighbor. Other  hx of attempts to break in; Advised to call the police or seek legal advise if the  neighbor can not keep his dogs on his property.  Referred to the Claudia [name] for assistance in meeting Developmental disabled daughter's needs as well as to advocacy as appropriate.  Number given with address;  May also offer socialization for Claudia [name].  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   Medication review/10/17  new; Rx for clonidine to be taken prn for systolic >175 or diastolic >100. If she is using more than 3 times per week she will call back and we will adjust her medication regimen. Is already on 2 drug regimen with diltiazem and hctz. Insurance is recommending other meds than diltiazem and [facility]ia [name] to change medication;  Fall assessment / no   Mobilization and Functional losses in the last year. / does everything at the house   Urinary or fecal incontinence reviewed/ no urinary issues   Counseling: Colonoscopy; in 2006; was told she aged out  EKG: 07/23/2015 Hearing: adequate/ states one ear is less;  Dexa: July 2013; states she thinks she has had since; Osteoporosis but can't confirm without access to medical record. Educated regarding   taking Calcium ; Vit D 1000 mg  Mammogram 11/26/2014 No mammographic evidence of malignancy/  Ophthalmology exam; eye exam is due and she will schedule. Immunizations: checked records and cannot open to screen for immunization hx at this time. Tetanus; Tdap/ has had one; not sure how long ago; the patient will fup with Dr.  Polite to find out when she had immunizations and what she had Zostavax; had this one; had to pay for it at dr. Idelle Crouch [name] Dexa/ will get result PNA series/ states she has had pneumonia;     Current Care Team reviewed and updated Pain management at [name];      Cardiac Risk Factors include: advanced age (>19men, >86 women);family history of premature cardiovascular disease;dyslipidemia     Objective:     Vitals: BP 140/70 mmHg  Pulse 64  Temp(Src) 98.3 F (36.8 C) (Oral)  Ht 4\' 11"   (1.499 m)  Wt 113 lb (51.256 kg)  BMI 22.81 kg/m2  SpO2 98%  Tobacco History  Smoking status  . Never Smoker   Smokeless tobacco  . Not on file     Counseling given: Yes   Past Medical History  Diagnosis Date  . Back pain   . Hypertension   . Reflux   . Coronary artery disease   . Hyperlipidemia   . Osteoarthritis   . Mitral valve prolapse   . Chronic pain   . Osteoporosis   . Cataracts, both eyes   . Pain management   . Chronic narcotic use   . CHF (congestive heart failure) [name])    Past Surgical History  Procedure Laterality Date  . Appendectomy    . Abdominal hysterectomy    . Bladder surgery    . Shoulder surgery      Right  . Breast surgery     Family History  Problem Relation Age of Onset  . Hypertension Mother   . Diabetes Mother   . Heart disease Father   . Hypertension Father   . Diabetes Father    History  Sexual Activity  . Sexual Activity: No    Outpatient Encounter Prescriptions as of 08/20/2015  Medication Sig  . cloNIDine (CATAPRES) 0.1 MG tablet Take 1 tablet (0.1 mg total) by mouth daily as needed (for BP >180 or >100).  . Cyanocobalamin (B-12 PO) Take 1 tablet by mouth daily.  Marland Kitchen docusate sodium 100 MG CAPS Take 100 mg by mouth daily.  . fentaNYL (DURAGESIC - DOSED MCG/HR) 100 MCG/HR Place 100 mcg onto the skin every other day.   Marland Kitchen LORazepam (ATIVAN) 0.5 MG tablet Take 1 tablet (0.5 mg total) by mouth every 8 (eight) hours as needed for anxiety.  . nitroGLYCERIN (NITROSTAT) 0.4 MG SL tablet Place 0.4 mg under the tongue every 5 (five) minutes as needed for chest pain.  . pantoprazole (PROTONIX) 40 MG tablet Take 40 mg by mouth every morning.   . simvastatin (ZOCOR) 20 MG tablet Take 20 mg by mouth every evening.  . triamterene-hydrochlorothiazide (DYAZIDE) 37.5-25 MG per capsule Take 1 capsule by mouth every morning.   . diltiazem (DILACOR XR) 180 MG 24 hr capsule Take 2 capsules (360 mg total) by mouth daily. (Patient not taking:  Reported on 08/20/2015)   No facility-administered encounter medications on file as of 08/20/2015.    Activities of Daily Living In your present state of health, do you have any difficulty performing the following activities: 08/20/2015  Hearing? N  Vision? N  Difficulty concentrating or making decisions? (No Data)  Walking or climbing stairs? N  Dressing or bathing? N  Doing errands, shopping? N  Preparing Food and eating ? N  Using the Toilet? N  In the past six months, have you accidently leaked urine? N  Do you have problems with loss of bowel control? N  Managing your Medications?  N  Managing your Finances? N  Housekeeping or managing your Housekeeping? N    Patient Care Team: [name], MD as PCP - General (Internal Medicine)    Assessment:  Assessment   Patient presents for yearly preventative medicine examination. Medicare questionnaire screening were completed, i.e. Functional; fall risk; depression, memory loss and hearing. Will try to get hearing screen in the future but not an issues at present  All immunizations and health maintenance protocols were reviewed with the patient will try to get dates of vaccinations as well as test  Education provided for laboratory screens;  HDL 97; Trig 66  Medication reconciliation, past medical history, social history, problem list and allergies were reviewed in detail with the patient Claudia [name] did fup on changing Diltiazem to one of the covered drugs per insurance request.  Goals were established with regard to the patient's needs, but states she is not unhappy at the present time. Has adjusted to her weight; Will try to eat something at each meal  End of life planning was discussed completed; Dtr has HCPOA for patient as well as will provide care for Regis Bill if her own health should decline   Exercise Activities and Dietary recommendations Current Exercise Habits:: Home exercise routine, Type of exercise: Other  - see comments (care giver full time; up and busy during the day), Intensity: Mild  Goals    . maintain health     Will continue to maintain health; does not feel weight loss is a problem Can supplement your diet with boost or muscle milk; Would encourage eating something at mealtime      Fall Risk Fall Risk  08/20/2015 07/26/2015  Falls in the past year? No No   Depression Screen PHQ 2/9 Scores 08/20/2015 07/26/2015  PHQ - 2 Score 0 0     Cognitive Testing MMSE - Mini Mental State Exam 08/20/2015  Not completed: (No Data)   AD8 Score 0; States she was given a memory test this year and did fine. Immunization History  Administered Date(s) Administered  . Influenza,inj,Quad PF,36+ Mos 06/17/2015   Screening Tests Health Maintenance  Topic Date Due  . TETANUS/TDAP  11/20/1949  . ZOSTAVAX  11/19/1992  . DEXA SCAN  11/19/1997  . PNA vac Low Risk Adult (1 of 2 - PCV13) 11/19/1997  . INFLUENZA VACCINE  05/09/2016      Plan:   Will check with Dr. Nehemiah Settle for Immunization hx: Prevnar 13 or PSV 23 TD or Tdap Zostavax To get list and can call Darl Pikes at [phone] Dexa scan ( to take Calcium  or Vit D3 1000u)  Do not have confirmation of osteoporosis on file  May have hearing checked; will make referral   MRI is pending at [address]  w, [address]  [phone]  Eye exam is due; Dr. Dione Booze will make apt  Will try supplements when not hungry as muscle milk / carnation instant breakfast   During the course of the visit the patient was educated and counseled about the following appropriate screening and preventive services:   Vaccines to include Pneumoccal, Influenza, Hepatitis B, Td, Zostavax, HCV/ will call with dates  Electrocardiogram/ 07/23/2015  Cardiovascular Disease/ HTN controlled at present on meds  Colorectal cancer screening/ aged out  Bone density screening/ awaiting dexa scan; states she has had one recently; maybe this year;  requested she get report from dr. Nehemiah Settle   Diabetes screening/n/a  Glaucoma screening/ to schedule eye exam  Mammography/PAP/11/26/2014  Nutrition counseling / educated to eat at least every meal;   Patient Instructions (the written plan) was given to the patient.   Montine CircleHauck,Aliany Fiorenza, RN  08/20/2015

## 2015-08-20 NOTE — Patient Instructions (Addendum)
Claudia [name] , Thank you for taking time to come for your Medicare Wellness Visit. I appreciate your ongoing commitment to your health goals. Please review the following plan we discussed and let me know if I can assist you in the future.   Will check with Dr. Delfina Redwood for Immunization hx: Prevnar 13 or PSV 23 TD or Tdap Zostavax To get list and can call Manuela Schwartz at [phone] Dexa scan ( to take Calcium 1266m or Vit D3 1000u)   May have hearing checked; will make referral   MRI is pending at [address]  [address], [address] ([phone] Eye exam is due; Dr. GKaty Fitch Will try supplements when not hungry as muscle milk / carnation instant breakfast    These are the goals we discussed: Goals    None      This is a list of the screening recommended for you and due dates:  Health Maintenance  Topic Date Due  . Tetanus Vaccine  11/20/1949  . Shingles Vaccine  11/19/1992  . DEXA scan (bone density measurement)  11/19/1997  . Pneumonia vaccines (1 of 2 - PCV13) 11/19/1997  . Flu Shot  05/09/2016    ' Bone Densitometry Bone densitometry is an imaging test that uses a special X-ray to measure the amount of calcium and other minerals in your bones (bone density). This test is also known as a bone mineral density test or dual-energy X-ray absorptiometry (DXA). The test can measure bone density at your hip and your spine. It is similar to having a regular X-ray. You may have this test to:  Diagnose a condition that causes weak or thin bones (osteoporosis).  Predict your risk of a broken bone (fracture).  Determine how well osteoporosis treatment is working. LET [facility] PROVID[facility] KNOW ABOUT:  Any allergies you have.  All medicines you are taking, including vitamins, herbs, eye drops, creams, and over-the-counter medicines.  Previous problems you or members of your family have had with the use of anesthetics.  Any blood disorders you have.  Previous  surgeries you have had.  Medical conditions you have.  Possibility of pregnancy.  Any other medical test you had within the previous 14 days that used contrast material. RISKS AND COMPLICATIONS Generally, this is a safe procedure. However, problems can occur and may include the following:  This test exposes you to a very small amount of radiation.  The risks of radiation exposure may be greater to unborn children. BEFORE THE PROCEDURE  Do not take any calcium supplements for 24 hours before having the test. You can otherwise eat and drink what you usually do.  Take off all metal jewelry, eyeglasses, dental appliances, and any other metal objects. PROCEDURE  You may lie on an exam table. There will be an X-ray generator below you and an imaging device above you.  Other devices, such as boxes or braces, may be used to position your body properly for the scan.  You will need to lie still while the machine slowly scans your body.  The images will show up on a computer monitor. AFTER THE PROCEDURE You may need more testing at a later time.   This information is not intended to replace advice given to you by your health care provider. Make sure you discuss any questions you have with your health care provider.   Document Released: 10/17/2004 Document Revised: 10/16/2014 Document Reviewed: 03/05/2014 Elsevier Interactive Patient Education 2016 [employer]  Maintenance, Female Adopting a healthy lifestyle and getting preventive care can go a long way to promote health and wellness. Talk with your health care provider about what schedule of regular examinations is right for you. This is a good chance for you to check in with your provider about disease prevention and staying healthy. In between checkups, there are plenty of things you can do on your own. Experts have done a lot of research about which lifestyle changes and preventive measures are most likely to keep you healthy.  Ask your health care provider for more information. WEIGHT AND DIET  Eat a healthy diet  Be sure to include plenty of vegetables, fruits, low-fat dairy products, and lean protein.  Do not eat a lot of foods high in solid fats, added sugars, or salt.  Get regular exercise. This is one of the most important things you can do for your health.  Most adults should exercise for at least 150 minutes each week. The exercise should increase your heart rate and make you sweat (moderate-intensity exercise).  Most adults should also do strengthening exercises at least twice a week. This is in addition to the moderate-intensity exercise.  Maintain a healthy weight  Body mass index (BMI) is a measurement that can be used to identify possible weight problems. It estimates body fat based on height and weight. Your health care provider can help determine your BMI and help you achieve or maintain a healthy weight.  For females 56 years of age and older:   A BMI below 18.5 is considered underweight.  A BMI of 18.5 to 24.9 is normal.  A BMI of 25 to 29.9 is considered overweight.  A BMI of 30 and above is considered obese.  Watch levels of cholesterol and blood lipids  You should start having your blood tested for lipids and cholesterol at 79 years of age, then have this test every 5 years.  You may need to have your cholesterol levels checked more often if:  Your lipid or cholesterol levels are high.  You are older than 79 years of age.  You are at high risk for heart disease.  CANCER SCREENING   Lung Cancer  Lung cancer screening is recommended for adults 87-11 years old who are at high risk for lung cancer because of a history of smoking.  A yearly low-dose CT scan of the lungs is recommended for people who:  Currently smoke.  Have quit within the past 15 years.  Have at least a 30-pack-year history of smoking. A pack year is smoking an average of one pack of cigarettes a day for  1 year.  Yearly screening should continue until it has been 15 years since you quit.  Yearly screening should stop if you develop a health problem that would prevent you from having lung cancer treatment.  Breast Cancer  Practice breast self-awareness. This means understanding how your breasts normally appear and feel.  It also means doing regular breast self-exams. Let your health care provider know about any changes, no matter how small.  If you are in your 20s or 30s, you should have a clinical breast exam (CBE) by a health care provider every 1-3 years as part of a regular health exam.  If you are 61 or older, have a CBE every year. Also consider having a breast X-ray (mammogram) every year.  If you have a family history of breast cancer, talk to your health care provider about genetic screening.  If you  are at high risk for breast cancer, talk to your health care provider about having an MRI and a mammogram every year.  Breast cancer gene (BRCA) assessment is recommended for women who have family members with BRCA-related cancers. BRCA-related cancers include:  Breast.  Ovarian.  Tubal.  Peritoneal cancers.  Results of the assessment will determine the need for genetic counseling and BRCA1 and BRCA2 testing. Cervical Cancer Your health care provider may recommend that you be screened regularly for cancer of the pelvic organs (ovaries, uterus, and vagina). This screening involves a pelvic examination, including checking for microscopic changes to the surface of your cervix (Pap test). You may be encouraged to have this screening done every 3 years, beginning at age 82.  For women ages 51-65, health care providers may recommend pelvic exams and Pap testing every 3 years, or they may recommend the Pap and pelvic exam, combined with testing for human papilloma virus (HPV), every 5 years. Some types of HPV increase your risk of cervical cancer. Testing for HPV may also be done on  women of any age with unclear Pap test results.  Other health care providers may not recommend any screening for nonpregnant women who are considered low risk for pelvic cancer and who do not have symptoms. Ask your health care provider if a screening pelvic exam is right for you.  If you have had past treatment for cervical cancer or a condition that could lead to cancer, you need Pap tests and screening for cancer for at least 20 years after your treatment. If Pap tests have been discontinued, your risk factors (such as having a new sexual partner) need to be reassessed to determine if screening should resume. Some women have medical problems that increase the chance of getting cervical cancer. In these cases, your health care provider may recommend more frequent screening and Pap tests. Colorectal Cancer  This type of cancer can be detected and often prevented.  Routine colorectal cancer screening usually begins at 79 years of age and continues through 79 years of age.  Your health care provider may recommend screening at an earlier age if you have risk factors for colon cancer.  Your health care provider may also recommend using home test kits to check for hidden blood in the stool.  A small camera at the end of a tube can be used to examine your colon directly (sigmoidoscopy or colonoscopy). This is done to check for the earliest forms of colorectal cancer.  Routine screening usually begins at age 16.  Direct examination of the colon should be repeated every 5-10 years through 79 years of age. However, you may need to be screened more often if early forms of precancerous polyps or small growths are found. Skin Cancer  Check your skin from head to toe regularly.  Tell your health care provider about any new moles or changes in moles, especially if there is a change in a mole's shape or color.  Also tell your health care provider if you have a mole that is larger than the size of a  pencil eraser.  Always use sunscreen. Apply sunscreen liberally and repeatedly throughout the day.  Protect yourself by wearing long sleeves, pants, a wide-brimmed hat, and sunglasses whenever you are outside. HEART DISEASE, DIABETES, AND HIGH BLOOD PRESSURE   High blood pressure causes heart disease and increases the risk of stroke. High blood pressure is more likely to develop in:  People who have blood pressure in the high end  of the normal range (130-139/85-89 mm Hg).  People who are overweight or obese.  People who are African American.  If you are 73-41 years of age, have your blood pressure checked every 3-5 years. If you are 75 years of age or older, have your blood pressure checked every year. You should have your blood pressure measured twice--once when you are at a hospital or clinic, and once when you are not at a hospital or clinic. Record the average of the two measurements. To check your blood pressure when you are not at a hospital or clinic, you can use:  An automated blood pressure machine at a pharmacy.  A home blood pressure monitor.  If you are between 54 years and 38 years old, ask your health care provider if you should take aspirin to prevent strokes.  Have regular diabetes screenings. This involves taking a blood sample to check your fasting blood sugar level.  If you are at a normal weight and have a low risk for diabetes, have this test once every three years after 79 years of age.  If you are overweight and have a high risk for diabetes, consider being tested at a younger age or more often. PREVENTING INFECTION  Hepatitis B  If you have a higher risk for hepatitis B, you should be screened for this virus. You are considered at high risk for hepatitis B if:  You were born in a country where hepatitis B is common. Ask your health care provider which countries are considered high risk.  Your parents were born in a high-risk country, and you have not been  immunized against hepatitis B (hepatitis B vaccine).  You have HIV or AIDS.  You use needles to inject street drugs.  You live with someone who has hepatitis B.  You have had sex with someone who has hepatitis B.  You get hemodialysis treatment.  You take certain medicines for conditions, including cancer, organ transplantation, and autoimmune conditions. Hepatitis C  Blood testing is recommended for:  Everyone born from [birth year] through 1963.  Anyone with known risk factors for hepatitis C. Sexually transmitted infections (STIs)  You should be screened for sexually transmitted infections (STIs) including gonorrhea and chlamydia if:  You are sexually active and are younger than 79 years of age.  You are older than 79 years of age and your health care provider tells you that you are at risk for this type of infection.  Your sexual activity has changed since you were last screened and you are at an increased risk for chlamydia or gonorrhea. Ask your health care provider if you are at risk.  If you do not have HIV, but are at risk, it may be recommended that you take a prescription medicine daily to prevent HIV infection. This is called pre-exposure prophylaxis (PrEP). You are considered at risk if:  You are sexually active and do not regularly use condoms or know the HIV status of your partner(s).  You take drugs by injection.  You are sexually active with a partner who has HIV. Talk with your health care provider about whether you are at high risk of being infected with HIV. If you choose to begin PrEP, you should first be tested for HIV. You should then be tested every 3 months for as long as you are taking PrEP.  PREGNANCY   If you are premenopausal and you may become pregnant, ask your health care provider about preconception counseling.  If you may become  pregnant, take 400 to 800 micrograms (mcg) of folic acid every day.  If you want to prevent pregnancy, talk to your  health care provider about birth control (contraception). OSTEOPOROSIS AND MENOPAUSE   Osteoporosis is a disease in which the bones lose minerals and strength with aging. This can result in serious bone fractures. Your risk for osteoporosis can be identified using a bone density scan.  If you are 39 years of age or older, or if you are at risk for osteoporosis and fractures, ask your health care provider if you should be screened.  Ask your health care provider whether you should take a calcium or vitamin D supplement to lower your risk for osteoporosis.  Menopause may have certain physical symptoms and risks.  Hormone replacement therapy may reduce some of these symptoms and risks. Talk to your health care provider about whether hormone replacement therapy is right for you.  HOME CARE INSTRUCTIONS   Schedule regular health, dental, and eye exams.  Stay current with your immunizations.   Do not use any tobacco products including cigarettes, chewing tobacco, or electronic cigarettes.  If you are pregnant, do not drink alcohol.  If you are breastfeeding, limit how much and how often you drink alcohol.  Limit alcohol intake to no more than 1 drink per day for nonpregnant women. One drink equals 12 ounces of beer, 5 ounces of wine, or 1 ounces of hard liquor.  Do not use street drugs.  Do not share needles.  Ask your health care provider for help if you need support or information about quitting drugs.  Tell your health care provider if you often feel depressed.  Tell your health care provider if you have ever been abused or do not feel safe at home.   This information is not intended to replace advice given to you by your health care provider. Make sure you discuss any questions you have with your health care provider.   Document Released: 04/10/2011 Document Revised: 10/16/2014 Document Reviewed: 08/27/2013 Elsevier Interactive Patient Education 2016 [employer].  Hearing  Loss Hearing loss is a partial or total loss of the ability to hear. This can be temporary or permanent, and it can happen in one or both ears. Hearing loss may be referred to as deafness. Medical care is necessary to treat hearing loss properly and to prevent the condition from getting worse. Your hearing may partially or completely come back, depending on what caused your hearing loss and how severe it is. In some cases, hearing loss is permanent. CAUSES Common causes of hearing loss include:   Too much wax in the ear canal.   Infection of the ear canal or middle ear.   Fluid in the middle ear.   Injury to the ear or surrounding area.   An object stuck in the ear.   Prolonged exposure to loud sounds, such as music.  Less common causes of hearing loss include:   Tumors in the ear.   Viral or bacterial infections, such as meningitis.   A hole in the eardrum (perforated eardrum).  Problems with the hearing nerve that sends signals between the brain and the ear.  Certain medicines.  SYMPTOMS  Symptoms of this condition may include:  Difficulty telling the difference between sounds.  Difficulty following a conversation when there is background noise.  Lack of response to sounds in your environment. This may be most noticeable when you do not respond to startling sounds.  Needing to turn up the  volume on the television, radio, etc.  Ringing in the ears.  Dizziness.  Pain in the ears. DIAGNOSIS This condition is diagnosed based on a physical exam and a hearing test (audiometry). The audiometry test will be performed by a hearing specialist (audiologist). You may also be referred to an ear, nose, and throat (ENT) specialist (otolaryngologist).  TREATMENT Treatment for recent onset of hearing loss may include:   Ear wax removal.   Being prescribed medicines to prevent infection (antibiotics).   Being prescribed medicines to reduce inflammation  (corticosteroids).  HOME CARE INSTRUCTIONS  If you were prescribed an antibiotic medicine, take it as told by your health care provider. Do not stop taking the antibiotic even if you start to feel better.  Take over-the-counter and prescription medicines only as told by your health care provider.  Avoid loud noises.   Return to your normal activities as told by your health care provider. Ask your health care provider what activities are safe for you.  Keep all follow-up visits as told by your health care provider. This is important. SEEK MEDICAL CARE IF:   You feel dizzy.   You develop new symptoms.   You vomit or feel nauseous.   You have a fever.  SEEK IMMEDIATE MEDICAL CARE IF:  You develop sudden changes in your vision.   You have severe ear pain.   You have new or increased weakness.  You have a severe headache.   This information is not intended to replace advice given to you by your health care provider. Make sure you discuss any questions you have with your health care provider.   Document Released: 09/25/2005 Document Revised: 06/16/2015 Document Reviewed: 02/10/2015 Elsevier Interactive Patient Education [employer].

## 2015-08-20 NOTE — Progress Notes (Signed)
Opened in error, disregard.

## 2015-08-26 DIAGNOSIS — M47816 Spondylosis without myelopathy or radiculopathy, lumbar region: Secondary | ICD-10-CM | POA: Diagnosis not present

## 2015-08-26 DIAGNOSIS — M47812 Spondylosis without myelopathy or radiculopathy, cervical region: Secondary | ICD-10-CM | POA: Diagnosis not present

## 2015-08-26 DIAGNOSIS — M4806 Spinal stenosis, lumbar region: Secondary | ICD-10-CM | POA: Diagnosis not present

## 2015-08-26 DIAGNOSIS — G894 Chronic pain syndrome: Secondary | ICD-10-CM | POA: Diagnosis not present

## 2015-09-07 DIAGNOSIS — H04123 Dry eye syndrome of bilateral lacrimal glands: Secondary | ICD-10-CM | POA: Diagnosis not present

## 2015-09-07 DIAGNOSIS — Z961 Presence of intraocular lens: Secondary | ICD-10-CM | POA: Diagnosis not present

## 2015-09-14 ENCOUNTER — Telehealth: Payer: Self-pay | Admitting: *Deleted

## 2015-09-14 NOTE — Telephone Encounter (Signed)
Received call pt states she has change her pharmacy back to [facility] because its more convienencet for her. Updated pharmacy.Raechel Chute/lmb

## 2015-09-18 ENCOUNTER — Ambulatory Visit
Admission: RE | Admit: 2015-09-18 | Discharge: 2015-09-18 | Disposition: A | Discharge status: Home / Self Care | Payer: Medicare Other | Source: Ambulatory Visit | Attending: Physical Medicine and Rehabilitation | Admitting: Physical Medicine and Rehabilitation

## 2015-09-18 DIAGNOSIS — M5136 Other intervertebral disc degeneration, lumbar region: Secondary | ICD-10-CM | POA: Diagnosis not present

## 2015-09-18 DIAGNOSIS — M545 Low back pain: Secondary | ICD-10-CM

## 2015-09-22 ENCOUNTER — Telehealth: Payer: Self-pay

## 2015-09-22 NOTE — Telephone Encounter (Signed)
Rec'd fax from [facility] on 08/23/2015 and recorded immunizations current status; the fax was sent to be scanned  PSV 23 08/16/1999 Prevnar 13 11/13/2014 Tdap 11/13/2014 Flu shot annually 2011, 2012, 2013, 2014; High dose 2015;

## 2015-09-27 ENCOUNTER — Telehealth: Payer: Self-pay | Admitting: Internal Medicine

## 2015-09-27 MED ORDER — LORAZEPAM 0.5 MG PO TABS: 0.5000 mg | ORAL_TABLET | Freq: Three times a day (TID) | ORAL | Status: DC | PRN

## 2015-09-27 NOTE — Telephone Encounter (Signed)
[facility] called requesting refill for LORazepam (ATIVAN) 0.5 MG tablet [[id number]

## 2015-09-27 NOTE — Telephone Encounter (Signed)
Faxed

## 2015-09-27 NOTE — Telephone Encounter (Signed)
Printed and signed, please fax.  

## 2015-09-28 ENCOUNTER — Other Ambulatory Visit: Payer: Self-pay | Admitting: Geriatric Medicine

## 2015-09-28 ENCOUNTER — Telehealth: Payer: Self-pay | Admitting: Internal Medicine

## 2015-09-28 DIAGNOSIS — G894 Chronic pain syndrome: Secondary | ICD-10-CM | POA: Diagnosis not present

## 2015-09-28 DIAGNOSIS — M4806 Spinal stenosis, lumbar region: Secondary | ICD-10-CM | POA: Diagnosis not present

## 2015-09-28 DIAGNOSIS — M47812 Spondylosis without myelopathy or radiculopathy, cervical region: Secondary | ICD-10-CM | POA: Diagnosis not present

## 2015-09-28 DIAGNOSIS — M47816 Spondylosis without myelopathy or radiculopathy, lumbar region: Secondary | ICD-10-CM | POA: Diagnosis not present

## 2015-09-28 MED ORDER — POLYETHYLENE GLYCOL 3350 17 GM/SCOOP PO POWD
ORAL | Status: DC
Start: 2015-09-28 — End: 2016-03-21

## 2015-09-28 MED ORDER — POLYETHYLENE GLYCOL 3350 17 GM/SCOOP PO POWD: ORAL | Status: DC

## 2015-09-28 NOTE — Telephone Encounter (Signed)
[facility] called back stating they also have a request for polyethylene glycol (MIRALAX / GLYCOLAX) packet 17 g  [[id number]

## 2015-09-28 NOTE — Telephone Encounter (Signed)
Sent to pharmacy 

## 2015-09-28 NOTE — Telephone Encounter (Signed)
States a small bottle of miralax was sent to her pharmacy.  Patient states she has to take this everyday and is requesting a large bottle to be sent.

## 2015-09-28 NOTE — Telephone Encounter (Signed)
Sent larger bottle to pharmacy.

## 2015-10-26 DIAGNOSIS — M4806 Spinal stenosis, lumbar region: Secondary | ICD-10-CM | POA: Diagnosis not present

## 2015-10-26 DIAGNOSIS — M47812 Spondylosis without myelopathy or radiculopathy, cervical region: Secondary | ICD-10-CM | POA: Diagnosis not present

## 2015-10-26 DIAGNOSIS — G894 Chronic pain syndrome: Secondary | ICD-10-CM | POA: Diagnosis not present

## 2015-10-26 DIAGNOSIS — M47816 Spondylosis without myelopathy or radiculopathy, lumbar region: Secondary | ICD-10-CM | POA: Diagnosis not present

## 2015-11-18 DIAGNOSIS — M4806 Spinal stenosis, lumbar region: Secondary | ICD-10-CM | POA: Diagnosis not present

## 2015-11-18 DIAGNOSIS — M4317 Spondylolisthesis, lumbosacral region: Secondary | ICD-10-CM | POA: Diagnosis not present

## 2015-11-24 ENCOUNTER — Other Ambulatory Visit: Payer: Self-pay | Admitting: Internal Medicine

## 2015-11-24 DIAGNOSIS — M47812 Spondylosis without myelopathy or radiculopathy, cervical region: Secondary | ICD-10-CM | POA: Diagnosis not present

## 2015-11-24 DIAGNOSIS — M47816 Spondylosis without myelopathy or radiculopathy, lumbar region: Secondary | ICD-10-CM | POA: Diagnosis not present

## 2015-11-24 DIAGNOSIS — G894 Chronic pain syndrome: Secondary | ICD-10-CM | POA: Diagnosis not present

## 2015-11-24 DIAGNOSIS — M4806 Spinal stenosis, lumbar region: Secondary | ICD-10-CM | POA: Diagnosis not present

## 2015-12-15 ENCOUNTER — Ambulatory Visit (INDEPENDENT_AMBULATORY_CARE_PROVIDER_SITE_OTHER): Payer: Medicare Other | Admitting: Internal Medicine

## 2015-12-15 ENCOUNTER — Encounter: Payer: Self-pay | Admitting: Internal Medicine

## 2015-12-15 VITALS — BP 160/82 | HR 65 | Temp 98.0°F | Resp 14 | Ht 59.0 in | Wt 111.0 lb

## 2015-12-15 DIAGNOSIS — G8929 Other chronic pain: Secondary | ICD-10-CM

## 2015-12-15 DIAGNOSIS — I10 Essential (primary) hypertension: Secondary | ICD-10-CM | POA: Diagnosis not present

## 2015-12-15 DIAGNOSIS — M549 Dorsalgia, unspecified: Secondary | ICD-10-CM | POA: Diagnosis not present

## 2015-12-15 DIAGNOSIS — M545 Low back pain: Secondary | ICD-10-CM | POA: Diagnosis not present

## 2015-12-15 NOTE — Assessment & Plan Note (Signed)
Referral to pain clinic today as her previous one is not suiting her well anymore. She is also seeing neurosurgery soon to discuss alternative to narcotics.

## 2015-12-15 NOTE — Progress Notes (Signed)
   Subjective:    Patient ID: Claudia [name], female    DOB: 08/22/1931, 82 y.o.   MRN: [id number]  HPI The patient is an 82 YO female coming in for follow up of her blood pressure. She is still taking her medicines daily. She has not needed the clonidine since last visit. 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 Denies chest pains, SOB, headaches. No other new concerns. Did not take this morning due to the increase in urination.   Review of Systems  Constitutional: Negative for fever, activity change, appetite change, fatigue and unexpected weight change.  HENT: Negative.   Eyes: Negative.   Respiratory: Negative for cough, chest tightness, shortness of breath and wheezing.   Cardiovascular: Negative for chest pain, palpitations and leg swelling.  Gastrointestinal: Negative for nausea, abdominal pain, diarrhea, constipation and abdominal distention.  Musculoskeletal: Positive for back pain and arthralgias.  Skin: Negative.   Neurological: Negative.   Psychiatric/Behavioral: Negative for suicidal ideas, confusion, self-injury, dysphoric mood and decreased concentration. The patient is not nervous/anxious.       Objective:   Physical Exam  Constitutional: She is oriented to person, place, and time. She appears well-developed and well-nourished.  HENT:  Head: Normocephalic and atraumatic.  Left ear impacted with wax, after irrigation both TM normal.   Eyes: EOM are normal.  Neck: Normal range of motion.  Cardiovascular: Normal rate and regular rhythm.   No murmur heard. Pulmonary/Chest: Effort normal and breath sounds normal. No respiratory distress. She has no wheezes. She has no rales.  Abdominal: Soft. Bowel sounds are normal. She exhibits no distension. There is no tenderness. There is no rebound.  Musculoskeletal: She exhibits no edema.  Neurological: She is alert and oriented to person, place, and time. Coordination normal.  Skin: Skin is warm and  dry.  Psychiatric: She has a normal mood and affect.   Filed Vitals:   12/15/15 1026  BP: 160/82  Pulse: 65  Temp: 98 F (36.7 C)  TempSrc: Oral  Resp: 14  Height: 4\' 11"  (1.499 m)  Weight: 111 lb (50.349 kg)  SpO2: 98%      Assessment & Plan:

## 2015-12-15 NOTE — Progress Notes (Signed)
Pre visit review using our clinic review tool, if applicable. No additional management support is needed unless otherwise documented below in the visit note. 

## 2015-12-15 NOTE — Assessment & Plan Note (Signed)
BP slightly above goal but recheck more normal. Previous at goal on her hctz. Adjust next time if needed. She will work on watching salt in her diet.

## 2015-12-15 NOTE — Patient Instructions (Signed)
We will get you in with a different pain doctor but it may take a couple of months to get the visit with them. So keep going to your current doctor until that time.   We have cleaned out the ear which may help with the hearing.

## 2015-12-20 ENCOUNTER — Other Ambulatory Visit: Payer: Self-pay | Admitting: Internal Medicine

## 2015-12-20 NOTE — Telephone Encounter (Signed)
Sent to pharmacy 

## 2015-12-23 DIAGNOSIS — M47816 Spondylosis without myelopathy or radiculopathy, lumbar region: Secondary | ICD-10-CM | POA: Diagnosis not present

## 2015-12-23 DIAGNOSIS — M4806 Spinal stenosis, lumbar region: Secondary | ICD-10-CM | POA: Diagnosis not present

## 2015-12-23 DIAGNOSIS — G894 Chronic pain syndrome: Secondary | ICD-10-CM | POA: Diagnosis not present

## 2015-12-23 DIAGNOSIS — M47812 Spondylosis without myelopathy or radiculopathy, cervical region: Secondary | ICD-10-CM | POA: Diagnosis not present

## 2015-12-27 ENCOUNTER — Telehealth: Payer: Self-pay | Admitting: Internal Medicine

## 2015-12-27 DIAGNOSIS — Z780 Asymptomatic menopausal state: Secondary | ICD-10-CM

## 2015-12-27 NOTE — Telephone Encounter (Signed)
Order placed

## 2015-12-27 NOTE — Telephone Encounter (Signed)
Patient is requesting an order to have DXA- she states that her orthopedic doctor wants it done.

## 2016-01-04 DIAGNOSIS — M5136 Other intervertebral disc degeneration, lumbar region: Secondary | ICD-10-CM | POA: Diagnosis not present

## 2016-01-04 DIAGNOSIS — M545 Low back pain: Secondary | ICD-10-CM | POA: Diagnosis not present

## 2016-01-04 DIAGNOSIS — M4726 Other spondylosis with radiculopathy, lumbar region: Secondary | ICD-10-CM | POA: Diagnosis not present

## 2016-01-04 DIAGNOSIS — M4806 Spinal stenosis, lumbar region: Secondary | ICD-10-CM | POA: Diagnosis not present

## 2016-01-04 DIAGNOSIS — M4316 Spondylolisthesis, lumbar region: Secondary | ICD-10-CM | POA: Diagnosis not present

## 2016-01-04 DIAGNOSIS — M549 Dorsalgia, unspecified: Secondary | ICD-10-CM | POA: Diagnosis not present

## 2016-01-05 DIAGNOSIS — M810 Age-related osteoporosis without current pathological fracture: Secondary | ICD-10-CM | POA: Diagnosis not present

## 2016-01-05 DIAGNOSIS — Z780 Asymptomatic menopausal state: Secondary | ICD-10-CM | POA: Diagnosis not present

## 2016-01-05 LAB — HM DEXA SCAN

## 2016-01-13 ENCOUNTER — Encounter: Payer: Self-pay | Admitting: Geriatric Medicine

## 2016-01-17 DIAGNOSIS — M4726 Other spondylosis with radiculopathy, lumbar region: Secondary | ICD-10-CM | POA: Diagnosis not present

## 2016-01-17 DIAGNOSIS — M4806 Spinal stenosis, lumbar region: Secondary | ICD-10-CM | POA: Diagnosis not present

## 2016-01-17 DIAGNOSIS — M5136 Other intervertebral disc degeneration, lumbar region: Secondary | ICD-10-CM | POA: Diagnosis not present

## 2016-01-17 DIAGNOSIS — M545 Low back pain: Secondary | ICD-10-CM | POA: Diagnosis not present

## 2016-01-17 DIAGNOSIS — I10 Essential (primary) hypertension: Secondary | ICD-10-CM | POA: Diagnosis not present

## 2016-01-17 DIAGNOSIS — M4316 Spondylolisthesis, lumbar region: Secondary | ICD-10-CM | POA: Diagnosis not present

## 2016-01-17 DIAGNOSIS — M549 Dorsalgia, unspecified: Secondary | ICD-10-CM | POA: Diagnosis not present

## 2016-01-20 ENCOUNTER — Other Ambulatory Visit: Payer: Self-pay

## 2016-01-20 MED ORDER — DILTIAZEM HCL ER COATED BEADS 360 MG PO CP24: 360.0000 mg | ORAL_CAPSULE | Freq: Every day | ORAL | Status: DC

## 2016-01-20 NOTE — Telephone Encounter (Signed)
PA received for pt's Diltiazem 180 mg 2 cap qd.  Insurance is suggesting dosing be changed to 360 mg 1 cap qd due to quantity exception.  Please advise on dosage change? Thanks!

## 2016-01-20 NOTE — Telephone Encounter (Signed)
Please review Rx ( I wasn't sure that this was the correct Rx), thanks!

## 2016-01-20 NOTE — Telephone Encounter (Signed)
Ok to change dose to 360 mg 1 pill daily.

## 2016-02-21 ENCOUNTER — Telehealth: Payer: Self-pay

## 2016-02-21 NOTE — Telephone Encounter (Signed)
Please advise in Dr. Crawford's absence, thanks.  

## 2016-02-21 NOTE — Telephone Encounter (Signed)
Ok to change

## 2016-02-21 NOTE — Telephone Encounter (Signed)
Pharmacy is calling regarding the diltiazem capsule size is too large for pt. Rq to change to the diltiazem (Tiazac). Tiazac is a smaller capsule.   [facility]. Any pharmacist can take the call.

## 2016-02-21 NOTE — Telephone Encounter (Signed)
Called and spoke with pharmacy and gave the ok to change to Tiazac.

## 2016-02-22 ENCOUNTER — Other Ambulatory Visit: Payer: Self-pay

## 2016-02-22 MED ORDER — DILTIAZEM HCL ER BEADS 360 MG PO CP24
360.0000 mg | ORAL_CAPSULE | Freq: Every day | ORAL | Status: DC
Start: 2016-02-22 — End: 2016-06-16

## 2016-02-22 NOTE — Telephone Encounter (Signed)
Changed patient med list to reflect the change. Forwarded change to assistance.

## 2016-02-28 DIAGNOSIS — M4726 Other spondylosis with radiculopathy, lumbar region: Secondary | ICD-10-CM | POA: Diagnosis not present

## 2016-02-28 DIAGNOSIS — M4316 Spondylolisthesis, lumbar region: Secondary | ICD-10-CM | POA: Diagnosis not present

## 2016-02-28 DIAGNOSIS — M5136 Other intervertebral disc degeneration, lumbar region: Secondary | ICD-10-CM | POA: Diagnosis not present

## 2016-02-28 DIAGNOSIS — Z79891 Long term (current) use of opiate analgesic: Secondary | ICD-10-CM | POA: Diagnosis not present

## 2016-02-28 DIAGNOSIS — M549 Dorsalgia, unspecified: Secondary | ICD-10-CM | POA: Diagnosis not present

## 2016-02-28 DIAGNOSIS — Z79899 Other long term (current) drug therapy: Secondary | ICD-10-CM | POA: Diagnosis not present

## 2016-02-28 DIAGNOSIS — Z5181 Encounter for therapeutic drug level monitoring: Secondary | ICD-10-CM | POA: Diagnosis not present

## 2016-02-28 DIAGNOSIS — M4806 Spinal stenosis, lumbar region: Secondary | ICD-10-CM | POA: Diagnosis not present

## 2016-02-28 DIAGNOSIS — I10 Essential (primary) hypertension: Secondary | ICD-10-CM | POA: Diagnosis not present

## 2016-02-28 DIAGNOSIS — M545 Low back pain: Secondary | ICD-10-CM | POA: Diagnosis not present

## 2016-02-29 DIAGNOSIS — H04123 Dry eye syndrome of bilateral lacrimal glands: Secondary | ICD-10-CM | POA: Diagnosis not present

## 2016-02-29 DIAGNOSIS — Z961 Presence of intraocular lens: Secondary | ICD-10-CM | POA: Diagnosis not present

## 2016-02-29 DIAGNOSIS — H40013 Open angle with borderline findings, low risk, bilateral: Secondary | ICD-10-CM | POA: Diagnosis not present

## 2016-02-29 DIAGNOSIS — H25812 Combined forms of age-related cataract, left eye: Secondary | ICD-10-CM | POA: Diagnosis not present

## 2016-03-20 ENCOUNTER — Telehealth: Payer: Self-pay | Admitting: *Deleted

## 2016-03-20 NOTE — Telephone Encounter (Signed)
Receive call pt states she will be changing pharmacy [facility]. Updated new pharmacy!

## 2016-03-21 ENCOUNTER — Other Ambulatory Visit: Payer: Self-pay | Admitting: Internal Medicine

## 2016-03-21 ENCOUNTER — Telehealth: Payer: Self-pay

## 2016-03-21 NOTE — Telephone Encounter (Signed)
PA initiated via CoverMyMeds key [id number]

## 2016-03-22 NOTE — Telephone Encounter (Signed)
APPROVED, fax to follow with dates

## 2016-03-22 NOTE — Telephone Encounter (Signed)
[phone] ref [id number]

## 2016-04-04 DIAGNOSIS — M4726 Other spondylosis with radiculopathy, lumbar region: Secondary | ICD-10-CM | POA: Diagnosis not present

## 2016-04-04 DIAGNOSIS — M4316 Spondylolisthesis, lumbar region: Secondary | ICD-10-CM | POA: Diagnosis not present

## 2016-04-04 DIAGNOSIS — M5136 Other intervertebral disc degeneration, lumbar region: Secondary | ICD-10-CM | POA: Diagnosis not present

## 2016-04-04 DIAGNOSIS — M4806 Spinal stenosis, lumbar region: Secondary | ICD-10-CM | POA: Diagnosis not present

## 2016-04-04 DIAGNOSIS — M545 Low back pain: Secondary | ICD-10-CM | POA: Diagnosis not present

## 2016-04-04 DIAGNOSIS — M549 Dorsalgia, unspecified: Secondary | ICD-10-CM | POA: Diagnosis not present

## 2016-04-05 ENCOUNTER — Ambulatory Visit: Payer: Medicare Other | Admitting: Internal Medicine

## 2016-04-05 DIAGNOSIS — Z0289 Encounter for other administrative examinations: Secondary | ICD-10-CM

## 2016-04-12 ENCOUNTER — Ambulatory Visit: Payer: Medicare Other | Admitting: Internal Medicine

## 2016-04-19 ENCOUNTER — Other Ambulatory Visit: Payer: Self-pay | Admitting: Internal Medicine

## 2016-04-20 ENCOUNTER — Telehealth: Payer: Self-pay | Admitting: Internal Medicine

## 2016-04-20 ENCOUNTER — Other Ambulatory Visit: Payer: Self-pay | Admitting: *Deleted

## 2016-04-20 MED ORDER — LORAZEPAM 0.5 MG PO TABS: 0.5000 mg | ORAL_TABLET | Freq: Three times a day (TID) | ORAL | Status: DC | PRN

## 2016-04-20 MED ORDER — POLYETHYLENE GLYCOL 3350 17 GM/SCOOP PO POWD: ORAL | Status: DC

## 2016-04-20 NOTE — Telephone Encounter (Signed)
Rec'd call pt requesting refill on her miralax. Sent rx to [facility]...Raechel Chute/lmb

## 2016-04-20 NOTE — Telephone Encounter (Signed)
Printed and signed.  

## 2016-04-20 NOTE — Telephone Encounter (Signed)
Sent to pharmacy 

## 2016-04-20 NOTE — Telephone Encounter (Signed)
Pharmacy is requesting a fill of LORazepam (ATIVAN) 0.5 MG tablet [[id number]][[id number]] be sent to them.

## 2016-04-28 NOTE — Telephone Encounter (Signed)
rec'd call [facility] stating they have sent several request for pt Lorazepam. Inform Darl PikesSusan per chart rx was approved and faxed back on 7/13, Darl PikesSusan state they never received script. Gave verbally authorization from 7/13...Raechel Chute/lmb

## 2016-05-24 ENCOUNTER — Telehealth: Payer: Self-pay | Admitting: *Deleted

## 2016-05-24 NOTE — Telephone Encounter (Signed)
Rec'd fax stating pt is wanting to get refill on Pantoprazole 40 mg med was originally rx by Dr. Nehemiah SettlePolite. Med not on med list is this ok to refill...Raechel Chute/lmb

## 2016-05-25 MED ORDER — PANTOPRAZOLE SODIUM 40 MG PO TBEC: 40.0000 mg | DELAYED_RELEASE_TABLET | Freq: Every day | ORAL | 1 refills | Status: DC

## 2016-05-25 NOTE — Telephone Encounter (Signed)
Sent in and we can discuss at next visit.

## 2016-06-16 ENCOUNTER — Other Ambulatory Visit (INDEPENDENT_AMBULATORY_CARE_PROVIDER_SITE_OTHER): Payer: Medicare Other

## 2016-06-16 ENCOUNTER — Ambulatory Visit (INDEPENDENT_AMBULATORY_CARE_PROVIDER_SITE_OTHER): Payer: Medicare Other | Admitting: Internal Medicine

## 2016-06-16 ENCOUNTER — Encounter: Payer: Self-pay | Admitting: Internal Medicine

## 2016-06-16 VITALS — BP 134/66 | HR 71 | Temp 98.2°F | Resp 14 | Ht 59.0 in | Wt 106.0 lb

## 2016-06-16 DIAGNOSIS — I10 Essential (primary) hypertension: Secondary | ICD-10-CM

## 2016-06-16 DIAGNOSIS — Z23 Encounter for immunization: Secondary | ICD-10-CM

## 2016-06-16 DIAGNOSIS — E789 Disorder of lipoprotein metabolism, unspecified: Secondary | ICD-10-CM

## 2016-06-16 LAB — COMPREHENSIVE METABOLIC PANEL
ALBUMIN: 4.2 g/dL (ref 3.5–5.2)
ALT: 15 U/L (ref 0–35)
AST: 17 U/L (ref 0–37)
Alkaline Phosphatase: 56 U/L (ref 39–117)
BILIRUBIN TOTAL: 0.4 mg/dL (ref 0.2–1.2)
BUN: 14 mg/dL (ref 6–23)
CALCIUM: 9.2 mg/dL (ref 8.4–10.5)
CHLORIDE: 98 meq/L (ref 96–112)
CO2: 34 meq/L — AB (ref 19–32)
CREATININE: 0.88 mg/dL (ref 0.40–1.20)
GFR: 65.13 mL/min (ref >60.00)
Glucose, Bld: 139 mg/dL — ABNORMAL HIGH (ref 70–99)
Potassium: 3.5 mEq/L (ref 3.5–5.1)
SODIUM: 138 meq/L (ref 135–145)
Total Protein: 7 g/dL (ref 6.0–8.3)

## 2016-06-16 LAB — CBC
HEMATOCRIT: 38.7 % (ref 36.0–46.0)
Hemoglobin: 13.2 g/dL (ref 12.0–15.0)
MCHC: 34.1 g/dL (ref 30.0–36.0)
MCV: 84.2 fl (ref 78.0–100.0)
Platelets: 262 10*3/uL (ref 150.0–400.0)
RBC: 4.6 Mil/uL (ref 3.87–5.11)
RDW: 15 % (ref 11.5–15.5)
WBC: 5.4 10*3/uL (ref 4.0–10.5)

## 2016-06-16 LAB — LIPID PANEL
CHOLESTEROL: 172 mg/dL (ref 0–200)
HDL: 72.1 mg/dL (ref >39.00)
LDL CALC: 88 mg/dL (ref 0–99)
NonHDL: 100.2
TRIGLYCERIDES: 61 mg/dL (ref 0.0–149.0)
Total CHOL/HDL Ratio: 2
VLDL: 12.2 mg/dL (ref 0.0–40.0)

## 2016-06-16 MED ORDER — DILTIAZEM HCL ER COATED BEADS 240 MG PO CP24: 240.0000 mg | ORAL_CAPSULE | Freq: Every day | ORAL | 3 refills | Status: DC

## 2016-06-16 NOTE — Assessment & Plan Note (Signed)
Will change diltiazem to 240 mg daily and continue hctz daily. Checking CMP and adjust as needed.

## 2016-06-16 NOTE — Progress Notes (Signed)
   Subjective:    Patient ID: Claudia [name], female    DOB: 18-Feb-1931, 82 y.o.   MRN: [id number]  HPI The patient is an 82 YO female coming in for follow up of her blood pressure. She had been changed to 2 of the 180 mg capsules diltiazem due to insurance and these do not do well with her. They are hard to swallow and she is not able to take them well. She would like to change if able. Still having some decreased appetite from her fentanyl patch and the pain clinic tried to change but she got severe side effects. She is not using any supplements or nutritional changes.   Review of Systems  Constitutional: Negative for activity change, appetite change, fatigue, fever and unexpected weight change.  HENT: Negative.   Respiratory: Negative for cough, chest tightness, shortness of breath and wheezing.   Cardiovascular: Negative for chest pain, palpitations and leg swelling.  Gastrointestinal: Negative for abdominal distention, abdominal pain, constipation, diarrhea and nausea.  Musculoskeletal: Positive for arthralgias and back pain.  Skin: Negative.   Neurological: Negative.   Psychiatric/Behavioral: Negative for confusion, decreased concentration, dysphoric mood, self-injury and suicidal ideas. The patient is not nervous/anxious.       Objective:   Physical Exam  Constitutional: She is oriented to person, place, and time. She appears well-developed and well-nourished.  HENT:  Head: Normocephalic and atraumatic.  Eyes: EOM are normal.  Neck: Normal range of motion.  Cardiovascular: Normal rate and regular rhythm.   Pulmonary/Chest: Effort normal and breath sounds normal. No respiratory distress. She has no wheezes. She has no rales.  Abdominal: Soft. She exhibits no distension. There is no tenderness. There is no rebound.  Musculoskeletal: She exhibits no edema.  Neurological: She is alert and oriented to person, place, and time. Coordination normal.  Skin: Skin is warm and dry.    Psychiatric: She has a normal mood and affect.   Vitals:   06/16/16 1050  BP: 134/66  Pulse: 71  Resp: 14  Temp: 98.2 F (36.8 C)  SpO2: 98%  Weight: 106 lb (48.1 kg)  Height: 4\' 11"  (1.499 m)      Assessment & Plan:  Flu shot given at visit.

## 2016-06-16 NOTE — Progress Notes (Signed)
Pre visit review using our clinic review tool, if applicable. No additional management support is needed unless otherwise documented below in the visit note. 

## 2016-06-16 NOTE — Patient Instructions (Signed)
We have given you the flu shot today and will check the blood work.

## 2016-06-16 NOTE — Assessment & Plan Note (Signed)
Still seeing pain clinic and this limits her appetite. We talked about adding some carnation instant breakfast or other nutrition supplements to her diet for some weight gain.

## 2016-07-10 DIAGNOSIS — I10 Essential (primary) hypertension: Secondary | ICD-10-CM | POA: Diagnosis not present

## 2016-07-10 DIAGNOSIS — M549 Dorsalgia, unspecified: Secondary | ICD-10-CM | POA: Diagnosis not present

## 2016-07-10 DIAGNOSIS — M545 Low back pain: Secondary | ICD-10-CM | POA: Diagnosis not present

## 2016-07-31 ENCOUNTER — Other Ambulatory Visit: Payer: Self-pay

## 2016-07-31 DIAGNOSIS — Z1231 Encounter for screening mammogram for malignant neoplasm of breast: Secondary | ICD-10-CM

## 2016-08-07 ENCOUNTER — Telehealth: Payer: Self-pay | Admitting: Internal Medicine

## 2016-08-07 NOTE — Telephone Encounter (Signed)
Pt was wondering if Dr. Okey Duprerawford wants to see her or what to recommend. Pt stating she has itch/painful at times under her left arm. She normally get a mammogram at the breast center and they told her to get order from Dr. Okey Duprerawford so they can look at it. Please advise

## 2016-08-08 NOTE — Telephone Encounter (Signed)
I'm not sure what she is asking about, it would be better if she made a visit to assess.

## 2016-08-11 ENCOUNTER — Ambulatory Visit (INDEPENDENT_AMBULATORY_CARE_PROVIDER_SITE_OTHER): Payer: Medicare Other | Admitting: Internal Medicine

## 2016-08-11 ENCOUNTER — Encounter: Payer: Self-pay | Admitting: Internal Medicine

## 2016-08-11 ENCOUNTER — Other Ambulatory Visit: Payer: Self-pay | Admitting: Internal Medicine

## 2016-08-11 VITALS — BP 138/62 | HR 68 | Temp 98.5°F | Resp 12 | Ht 59.0 in | Wt 106.0 lb

## 2016-08-11 DIAGNOSIS — N644 Mastodynia: Secondary | ICD-10-CM | POA: Diagnosis not present

## 2016-08-11 DIAGNOSIS — I10 Essential (primary) hypertension: Secondary | ICD-10-CM

## 2016-08-11 DIAGNOSIS — M79622 Pain in left upper arm: Secondary | ICD-10-CM | POA: Diagnosis not present

## 2016-08-11 DIAGNOSIS — Z9889 Other specified postprocedural states: Secondary | ICD-10-CM

## 2016-08-11 DIAGNOSIS — M79629 Pain in unspecified upper arm: Secondary | ICD-10-CM | POA: Insufficient documentation

## 2016-08-11 MED ORDER — DILTIAZEM HCL 120 MG PO TABS: 120.0000 mg | ORAL_TABLET | Freq: Two times a day (BID) | ORAL | 1 refills | Status: DC

## 2016-08-11 NOTE — Assessment & Plan Note (Signed)
Change diltiazem to tablet and BID 120 mg dosing. Continue her hctz as well.

## 2016-08-11 NOTE — Assessment & Plan Note (Signed)
No pain in the area or lymph nodes. Ordered diagnostic mammogram.

## 2016-08-11 NOTE — Progress Notes (Signed)
   Subjective:    Patient ID: Claudia [name], female    DOB: December 11, 1930, 82 y.o.   MRN: [id number]  HPI The patient is an 82 YO female coming in for itchy place on left armpit. Hx breast surgery. She went to get her mammogram and was told she would need the order from us first.  She is also having trouble swallowing her diltiazem capsules and is not able to take them. She feels her blood pressure is high in the morning from not being able to take that well. She denies headaches or chest pains. No SOB. No abdominal pain or discomfort.   Review of Systems  Constitutional: Negative.   HENT: Positive for trouble swallowing.        Her capsules  Respiratory: Negative.   Cardiovascular: Negative.   Gastrointestinal: Negative.   Skin:       Itchy under her left armpit      Objective:   Physical Exam  Constitutional: She is oriented to person, place, and time. She appears well-developed and well-nourished.  HENT:  Head: Normocephalic and atraumatic.  Eyes: EOM are normal.  Cardiovascular: Normal rate and regular rhythm.   Pulmonary/Chest: Effort normal and breath sounds normal. No respiratory distress. She has no wheezes.  Left armpit and breast region without lumps or lymph nodes.   Abdominal: Soft. She exhibits no distension. There is no tenderness. There is no rebound.  Neurological: She is alert and oriented to person, place, and time.  Skin: Skin is warm and dry.   Vitals:   08/11/16 1137  BP: 138/62  Pulse: 68  Resp: 12  Temp: 98.5 F (36.9 C)  TempSrc: Oral  SpO2: 98%  Weight: 106 lb (48.1 kg)  Height: 4\' 11"  (1.499 m)      Assessment & Plan:

## 2016-08-11 NOTE — Progress Notes (Signed)
Pre visit review using our clinic review tool, if applicable. No additional management support is needed unless otherwise documented below in the visit note. 

## 2016-08-11 NOTE — Patient Instructions (Signed)
We have ordered the mammogram and sent in the new prescription.  We have sent in diltiazem tablets to take 1 pill twice a day instead of the capsules once a day.

## 2016-08-14 ENCOUNTER — Telehealth: Payer: Self-pay | Admitting: *Deleted

## 2016-08-14 MED ORDER — DILTIAZEM HCL ER COATED BEADS 120 MG PO CP24: 120.0000 mg | ORAL_CAPSULE | Freq: Two times a day (BID) | ORAL | 1 refills | Status: DC

## 2016-08-14 NOTE — Telephone Encounter (Signed)
Called pharmacy spoke w/pharmacist Aneta Mins(Phillip) he states the ER doesn't come in a tablet form, but the capsules are very small. He is going to call pt to make sure if she can swallow. If not will call MD back. Updated med list.../lmb

## 2016-08-14 NOTE — Telephone Encounter (Signed)
Yes, that's fine. The only thing is she does not do well with capsules, is this a capsule?

## 2016-08-14 NOTE — Telephone Encounter (Signed)
Rec'd call from pharmacist stting they received script for Diltiazem 120 mg. The patient is wanting a smaller pill so she can be able to swallow it. Wanting to see if MD can change rx the ER and she can take 2 pills and a lot smaller...Raechel Chute/lmb

## 2016-08-15 ENCOUNTER — Telehealth: Payer: Self-pay | Admitting: Emergency Medicine

## 2016-08-15 MED ORDER — CLONIDINE HCL 0.1 MG PO TABS: 0.1000 mg | ORAL_TABLET | Freq: Every day | ORAL | 3 refills | Status: DC | PRN

## 2016-08-15 MED ORDER — TRIAMTERENE-HCTZ 37.5-25 MG PO TABS: ORAL_TABLET | ORAL | 3 refills | Status: DC

## 2016-08-15 NOTE — Telephone Encounter (Signed)
Pharmacy called and asked for a prescription refill on patients cloNIDine (CATAPRES) 0.1 MG tablet. Please advise thanks.

## 2016-08-15 NOTE — Telephone Encounter (Signed)
Rec'd call from pt stating she forgot to let MD know she needed refill on her BP med. Requesting refill. Per chart pharmacy already called will [facility]...Raechel Chute/lmb

## 2016-09-12 ENCOUNTER — Other Ambulatory Visit: Payer: Self-pay | Admitting: Internal Medicine

## 2016-09-13 ENCOUNTER — Telehealth: Payer: Self-pay | Admitting: Emergency Medicine

## 2016-09-13 NOTE — Telephone Encounter (Signed)
[facility] called and the patient wants to know if they can get a prescription refill for LORazepam (ATIVAN) 0.5 MG tablet. Please advise thanks.

## 2016-09-14 MED ORDER — LORAZEPAM 0.5 MG PO TABS: 0.5000 mg | ORAL_TABLET | Freq: Three times a day (TID) | ORAL | 3 refills | Status: DC | PRN

## 2016-09-14 NOTE — Telephone Encounter (Signed)
Faxed to pharmacy

## 2016-09-14 NOTE — Telephone Encounter (Signed)
Printed and signed.  

## 2016-09-15 ENCOUNTER — Other Ambulatory Visit: Payer: Self-pay

## 2016-09-21 DIAGNOSIS — M549 Dorsalgia, unspecified: Secondary | ICD-10-CM | POA: Diagnosis not present

## 2016-09-21 DIAGNOSIS — I10 Essential (primary) hypertension: Secondary | ICD-10-CM | POA: Diagnosis not present

## 2016-09-21 DIAGNOSIS — M545 Low back pain: Secondary | ICD-10-CM | POA: Diagnosis not present

## 2016-09-21 DIAGNOSIS — M48061 Spinal stenosis, lumbar region without neurogenic claudication: Secondary | ICD-10-CM | POA: Diagnosis not present

## 2016-10-10 ENCOUNTER — Other Ambulatory Visit: Payer: Self-pay | Admitting: Internal Medicine

## 2016-10-13 ENCOUNTER — Telehealth: Payer: Self-pay | Admitting: Internal Medicine

## 2016-10-13 NOTE — Telephone Encounter (Signed)
Pt called in and said that she was suppose to have a referral to pain management.  I did not see a referral in the system.  Is she suppose to have a referral ?

## 2016-10-13 NOTE — Telephone Encounter (Signed)
Contacted patient and patient is wanting a different pain management, she said she wants one that is connected through the doctor here.  Also had a question about a breast exam she was recommended to get but they said she needed something from her doctor to be able to get it.

## 2016-10-13 NOTE — Telephone Encounter (Signed)
We had placed one almost a year ago. She is seeing pain management already. Is she requesting a new pain place?

## 2016-10-13 NOTE — Telephone Encounter (Signed)
We have ordered the mammogram for her to get so she should be able to call the breast center to schedule. We have not gotten anything from them. We do not have a pain management center that is through this office.

## 2016-10-13 NOTE — Telephone Encounter (Signed)
Contacted patient and she stated understanding.

## 2016-10-23 ENCOUNTER — Other Ambulatory Visit: Payer: Self-pay | Admitting: Internal Medicine

## 2016-10-23 DIAGNOSIS — N644 Mastodynia: Secondary | ICD-10-CM

## 2016-10-30 DIAGNOSIS — M48062 Spinal stenosis, lumbar region with neurogenic claudication: Secondary | ICD-10-CM | POA: Diagnosis not present

## 2016-10-31 ENCOUNTER — Other Ambulatory Visit: Payer: Self-pay | Admitting: Internal Medicine

## 2016-10-31 ENCOUNTER — Other Ambulatory Visit: Payer: Self-pay

## 2016-10-31 DIAGNOSIS — N644 Mastodynia: Secondary | ICD-10-CM

## 2016-11-01 ENCOUNTER — Other Ambulatory Visit: Payer: Medicare Other

## 2016-11-17 DIAGNOSIS — I10 Essential (primary) hypertension: Secondary | ICD-10-CM | POA: Diagnosis not present

## 2016-11-17 DIAGNOSIS — M48062 Spinal stenosis, lumbar region with neurogenic claudication: Secondary | ICD-10-CM | POA: Diagnosis not present

## 2016-12-05 ENCOUNTER — Other Ambulatory Visit: Payer: Self-pay | Admitting: Internal Medicine

## 2016-12-15 ENCOUNTER — Encounter: Payer: Medicare Other | Admitting: Internal Medicine

## 2016-12-19 ENCOUNTER — Ambulatory Visit (INDEPENDENT_AMBULATORY_CARE_PROVIDER_SITE_OTHER): Payer: Medicare Other | Admitting: Internal Medicine

## 2016-12-19 ENCOUNTER — Encounter: Payer: Self-pay | Admitting: Internal Medicine

## 2016-12-19 DIAGNOSIS — Z0000 Encounter for general adult medical examination without abnormal findings: Secondary | ICD-10-CM | POA: Diagnosis not present

## 2016-12-19 MED ORDER — ESOMEPRAZOLE MAGNESIUM 40 MG PO CPDR: 40.0000 mg | DELAYED_RELEASE_CAPSULE | Freq: Every day | ORAL | 3 refills | Status: DC

## 2016-12-19 NOTE — Assessment & Plan Note (Signed)
Flu and tetanus and pneumonia up to date. Colonoscopy up to date and aged out. Mammogram aged out but none abnormal and no new lesions. Counseled on home safety and exercise. Given 10 year screening recommendations.

## 2016-12-19 NOTE — Patient Instructions (Signed)
Health Maintenance, Female Adopting a healthy lifestyle and getting preventive care can go a long way to promote health and wellness. Talk with your health care provider about what schedule of regular examinations is right for you. This is a good chance for you to check in with your provider about disease prevention and staying healthy. In between checkups, there are plenty of things you can do on your own. Experts have done a lot of research about which lifestyle changes and preventive measures are most likely to keep you healthy. Ask your health care provider for more information. Weight and diet Eat a healthy diet  Be sure to include plenty of vegetables, fruits, low-fat dairy products, and lean protein.  Do not eat a lot of foods high in solid fats, added sugars, or salt.  Get regular exercise. This is one of the most important things you can do for your health.  Most adults should exercise for at least 150 minutes each week. The exercise should increase your heart rate and make you sweat (moderate-intensity exercise).  Most adults should also do strengthening exercises at least twice a week. This is in addition to the moderate-intensity exercise. Maintain a healthy weight  Body mass index (BMI) is a measurement that can be used to identify possible weight problems. It estimates body fat based on height and weight. Your health care provider can help determine your BMI and help you achieve or maintain a healthy weight.  For females 42 years of age and older:  A BMI below 18.5 is considered underweight.  A BMI of 18.5 to 24.9 is normal.  A BMI of 25 to 29.9 is considered overweight.  A BMI of 30 and above is considered obese. Watch levels of cholesterol and blood lipids  You should start having your blood tested for lipids and cholesterol at 81 years of age, then have this test every 5 years.  You may need to have your cholesterol levels checked more often if:  Your lipid or  cholesterol levels are high.  You are older than 81 years of age.  You are at high risk for heart disease. Cancer screening Lung Cancer  Lung cancer screening is recommended for adults 63-61 years old who are at high risk for lung cancer because of a history of smoking.  A yearly low-dose CT scan of the lungs is recommended for people who:  Currently smoke.  Have quit within the past 15 years.  Have at least a 30-pack-year history of smoking. A pack year is smoking an average of one pack of cigarettes a day for 1 year.  Yearly screening should continue until it has been 15 years since you quit.  Yearly screening should stop if you develop a health problem that would prevent you from having lung cancer treatment. Breast Cancer  Practice breast self-awareness. This means understanding how your breasts normally appear and feel.  It also means doing regular breast self-exams. Let your health care provider know about any changes, no matter how small.  If you are in your 20s or 30s, you should have a clinical breast exam (CBE) by a health care provider every 1-3 years as part of a regular health exam.  If you are 59 or older, have a CBE every year. Also consider having a breast X-ray (mammogram) every year.  If you have a family history of breast cancer, talk to your health care provider about genetic screening.  If you are at high risk for breast cancer, talk  to your health care provider about having an MRI and a mammogram every year.  Breast cancer gene (BRCA) assessment is recommended for women who have family members with BRCA-related cancers. BRCA-related cancers include:  Breast.  Ovarian.  Tubal.  Peritoneal cancers.  Results of the assessment will determine the need for genetic counseling and BRCA1 and BRCA2 testing. Cervical Cancer  Your health care provider may recommend that you be screened regularly for cancer of the pelvic organs (ovaries, uterus, and vagina).  This screening involves a pelvic examination, including checking for microscopic changes to the surface of your cervix (Pap test). You may be encouraged to have this screening done every 3 years, beginning at age 49.  For women ages 27-65, health care providers may recommend pelvic exams and Pap testing every 3 years, or they may recommend the Pap and pelvic exam, combined with testing for human papilloma virus (HPV), every 5 years. Some types of HPV increase your risk of cervical cancer. Testing for HPV may also be done on women of any age with unclear Pap test results.  Other health care providers may not recommend any screening for nonpregnant women who are considered low risk for pelvic cancer and who do not have symptoms. Ask your health care provider if a screening pelvic exam is right for you.  If you have had past treatment for cervical cancer or a condition that could lead to cancer, you need Pap tests and screening for cancer for at least 20 years after your treatment. If Pap tests have been discontinued, your risk factors (such as having a new sexual partner) need to be reassessed to determine if screening should resume. Some women have medical problems that increase the chance of getting cervical cancer. In these cases, your health care provider may recommend more frequent screening and Pap tests. Colorectal Cancer  This type of cancer can be detected and often prevented.  Routine colorectal cancer screening usually begins at 81 years of age and continues through 81 years of age.  Your health care provider may recommend screening at an earlier age if you have risk factors for colon cancer.  Your health care provider may also recommend using home test kits to check for hidden blood in the stool.  A small camera at the end of a tube can be used to examine your colon directly (sigmoidoscopy or colonoscopy). This is done to check for the earliest forms of colorectal cancer.  Routine  screening usually begins at age 6.  Direct examination of the colon should be repeated every 5-10 years through 81 years of age. However, you may need to be screened more often if early forms of precancerous polyps or small growths are found. Skin Cancer  Check your skin from head to toe regularly.  Tell your health care provider about any new moles or changes in moles, especially if there is a change in a mole's shape or color.  Also tell your health care provider if you have a mole that is larger than the size of a pencil eraser.  Always use sunscreen. Apply sunscreen liberally and repeatedly throughout the day.  Protect yourself by wearing long sleeves, pants, a wide-brimmed hat, and sunglasses whenever you are outside. Heart disease, diabetes, and high blood pressure  High blood pressure causes heart disease and increases the risk of stroke. High blood pressure is more likely to develop in:  People who have blood pressure in the high end of the normal range (130-139/85-89 mm Hg).  People who are overweight or obese.  People who are African American.  If you are 59-24 years of age, have your blood pressure checked every 3-5 years. If you are 34 years of age or older, have your blood pressure checked every year. You should have your blood pressure measured twice-once when you are at a hospital or clinic, and once when you are not at a hospital or clinic. Record the average of the two measurements. To check your blood pressure when you are not at a hospital or clinic, you can use:  An automated blood pressure machine at a pharmacy.  A home blood pressure monitor.  If you are between 29 years and 60 years old, ask your health care provider if you should take aspirin to prevent strokes.  Have regular diabetes screenings. This involves taking a blood sample to check your fasting blood sugar level.  If you are at a normal weight and have a low risk for diabetes, have this test once  every three years after 81 years of age.  If you are overweight and have a high risk for diabetes, consider being tested at a younger age or more often. Preventing infection Hepatitis B  If you have a higher risk for hepatitis B, you should be screened for this virus. You are considered at high risk for hepatitis B if:  You were born in a country where hepatitis B is common. Ask your health care provider which countries are considered high risk.  Your parents were born in a high-risk country, and you have not been immunized against hepatitis B (hepatitis B vaccine).  You have HIV or AIDS.  You use needles to inject street drugs.  You live with someone who has hepatitis B.  You have had sex with someone who has hepatitis B.  You get hemodialysis treatment.  You take certain medicines for conditions, including cancer, organ transplantation, and autoimmune conditions. Hepatitis C  Blood testing is recommended for:  Everyone born from [birth year] through 1963.  Anyone with known risk factors for hepatitis C. Sexually transmitted infections (STIs)  You should be screened for sexually transmitted infections (STIs) including gonorrhea and chlamydia if:  You are sexually active and are younger than 81 years of age.  You are older than 81 years of age and your health care provider tells you that you are at risk for this type of infection.  Your sexual activity has changed since you were last screened and you are at an increased risk for chlamydia or gonorrhea. Ask your health care provider if you are at risk.  If you do not have HIV, but are at risk, it may be recommended that you take a prescription medicine daily to prevent HIV infection. This is called pre-exposure prophylaxis (PrEP). You are considered at risk if:  You are sexually active and do not regularly use condoms or know the HIV status of your partner(s).  You take drugs by injection.  You are sexually active with a partner  who has HIV. Talk with your health care provider about whether you are at high risk of being infected with HIV. If you choose to begin PrEP, you should first be tested for HIV. You should then be tested every 3 months for as long as you are taking PrEP. Pregnancy  If you are premenopausal and you may become pregnant, ask your health care provider about preconception counseling.  If you may become pregnant, take 400 to 800 micrograms (mcg) of folic acid  every day.  If you want to prevent pregnancy, talk to your health care provider about birth control (contraception). Osteoporosis and menopause  Osteoporosis is a disease in which the bones lose minerals and strength with aging. This can result in serious bone fractures. Your risk for osteoporosis can be identified using a bone density scan.  If you are 4 years of age or older, or if you are at risk for osteoporosis and fractures, ask your health care provider if you should be screened.  Ask your health care provider whether you should take a calcium or vitamin D supplement to lower your risk for osteoporosis.  Menopause may have certain physical symptoms and risks.  Hormone replacement therapy may reduce some of these symptoms and risks. Talk to your health care provider about whether hormone replacement therapy is right for you. Follow these instructions at home:  Schedule regular health, dental, and eye exams.  Stay current with your immunizations.  Do not use any tobacco products including cigarettes, chewing tobacco, or electronic cigarettes.  If you are pregnant, do not drink alcohol.  If you are breastfeeding, limit how much and how often you drink alcohol.  Limit alcohol intake to no more than 1 drink per day for nonpregnant women. One drink equals 12 ounces of beer, 5 ounces of wine, or 1 ounces of hard liquor.  Do not use street drugs.  Do not share needles.  Ask your health care provider for help if you need support  or information about quitting drugs.  Tell your health care provider if you often feel depressed.  Tell your health care provider if you have ever been abused or do not feel safe at home. This information is not intended to replace advice given to you by your health care provider. Make sure you discuss any questions you have with your health care provider. Document Released: 04/10/2011 Document Revised: 03/02/2016 Document Reviewed: 06/29/2015 Elsevier Interactive Patient Education  2017 [employer].

## 2016-12-19 NOTE — Progress Notes (Signed)
Pre visit review using our clinic review tool, if applicable. No additional management support is needed unless otherwise documented below in the visit note. 

## 2016-12-19 NOTE — Progress Notes (Signed)
   Subjective:    Patient ID: Claudia [name], female    DOB: 08-01-1931, 83 y.o.   MRN: [id number]  HPI Here for medicare wellness, no new complaints. Please see A/P for status and treatment of chronic medical problems.   Diet: heart healthy Physical activity: sedentary Depression/mood screen: negative Hearing: intact to whispered voice Visual acuity: grossly normal, performs annual eye exam  ADLs: capable Fall risk: none Home safety: good Cognitive evaluation: intact to orientation, naming, recall and repetition EOL planning: adv directives discussed, in place  I have personally reviewed and have noted 1. The patient's medical and social history - reviewed today no changes 2. Their use of alcohol, tobacco or illicit drugs 3. Their current medications and supplements 4. The patient's functional ability including ADL's, fall risks, home safety risks and hearing or visual impairment. 5. Diet and physical activities 6. Evidence for depression or mood disorders 7. Care team reviewed and updated (available in snapshot)  Review of Systems  Constitutional: Negative.   HENT: Negative.   Eyes: Negative.   Respiratory: Negative for cough, chest tightness and shortness of breath.   Cardiovascular: Negative for chest pain, palpitations and leg swelling.  Gastrointestinal: Negative for abdominal distention, abdominal pain, constipation, diarrhea, nausea and vomiting.  Musculoskeletal: Negative.   Skin: Negative.   Neurological: Negative.   Psychiatric/Behavioral: Negative.       Objective:   Physical Exam  Constitutional: She is oriented to person, place, and time. She appears well-developed and well-nourished.  HENT:  Head: Normocephalic and atraumatic.  Eyes: EOM are normal.  Neck: Normal range of motion.  Cardiovascular: Normal rate and regular rhythm.   Pulmonary/Chest: Effort normal and breath sounds normal. No respiratory distress. She has no wheezes. She has no rales.    Abdominal: Soft. Bowel sounds are normal. She exhibits no distension. There is no tenderness. There is no rebound.  Musculoskeletal: She exhibits no edema.  Neurological: She is alert and oriented to person, place, and time. Coordination normal.  Skin: Skin is warm and dry.  Psychiatric: She has a normal mood and affect.   Vitals:   12/19/16 1012  BP: (!) 142/70  Pulse: 73  Temp: 98.4 F (36.9 C)  TempSrc: Oral  SpO2: 98%  Weight: 109 lb (49.4 kg)  Height: 4\' 11"  (1.499 m)      Assessment & Plan:

## 2016-12-21 ENCOUNTER — Telehealth: Payer: Self-pay

## 2016-12-21 NOTE — Telephone Encounter (Signed)
I have a PA for this medication but there is no dx to support the rx. Please advise.

## 2016-12-22 ENCOUNTER — Telehealth: Payer: Self-pay | Admitting: Internal Medicine

## 2016-12-22 NOTE — Telephone Encounter (Signed)
Esomeprazole.   (I put the medication is the visit information).

## 2016-12-22 NOTE — Telephone Encounter (Signed)
Pt called request to speak to the nurse concern about her being dizzy since she fall. Please give pt a call back, also transfer pt to speak to the team health as well.

## 2016-12-22 NOTE — Telephone Encounter (Signed)
For what?

## 2016-12-22 NOTE — Telephone Encounter (Signed)
Patient Name: Cecile SheererLOUISE Burdin  DOB: 05-26-1931    Initial Comment caller states she fell and hit her head and now has dizziness and not feeling right   Nurse Assessment  Nurse: Sherilyn CooterHenry, RN, Thurmond ButtsWade Date/Time Lamount Cohen(Eastern Time): 12/22/2016 1:02:08 PM  Confirm and document reason for call. If symptomatic, describe symptoms. ---Caller sates that she fell Tuesday before leaving for her doctor's appointment. There is swelling and bruising of her face. She has a black eye on the left side. She states that she has dizziness and she is able to walk. She has a "lot" of pain with this.  Does the patient have any new or worsening symptoms? ---Yes  Will a triage be completed? ---Yes  Related visit to physician within the last 2 weeks? ---Yes  Does the PT have any chronic conditions? (i.e. diabetes, asthma, etc.) ---Yes  List chronic conditions. ---HTN, Hypercholesterolemia  Is this a behavioral health or substance abuse call? ---No     Guidelines    Guideline Title Affirmed Question Affirmed Notes  Head Injury One or two "black eyes" (bruising, purple color of eyelids)    Final Disposition User   Go to ED Now Sherilyn CooterHenry, RN, Thurmond ButtsWade    Referrals  GO TO FACILITY UNDECIDED   Disagree/Comply: Danella Maiersomply

## 2016-12-24 ENCOUNTER — Emergency Department (HOSPITAL_COMMUNITY): Payer: Medicare Other

## 2016-12-24 ENCOUNTER — Emergency Department (HOSPITAL_COMMUNITY)
Admission: EM | Admit: 2016-12-24 | Discharge: 2016-12-24 | Disposition: A | Discharge status: Home / Self Care | Payer: Medicare Other | Attending: Emergency Medicine | Admitting: Emergency Medicine

## 2016-12-24 ENCOUNTER — Encounter (HOSPITAL_COMMUNITY): Payer: Self-pay | Admitting: Emergency Medicine

## 2016-12-24 DIAGNOSIS — I2510 Atherosclerotic heart disease of native coronary artery without angina pectoris: Secondary | ICD-10-CM | POA: Diagnosis not present

## 2016-12-24 DIAGNOSIS — S0012XA Contusion of left eyelid and periocular area, initial encounter: Secondary | ICD-10-CM | POA: Insufficient documentation

## 2016-12-24 DIAGNOSIS — W01198A Fall on same level from slipping, tripping and stumbling with subsequent striking against other object, initial encounter: Secondary | ICD-10-CM | POA: Diagnosis not present

## 2016-12-24 DIAGNOSIS — Y929 Unspecified place or not applicable: Secondary | ICD-10-CM | POA: Diagnosis not present

## 2016-12-24 DIAGNOSIS — Y9301 Activity, walking, marching and hiking: Secondary | ICD-10-CM | POA: Insufficient documentation

## 2016-12-24 DIAGNOSIS — Y999 Unspecified external cause status: Secondary | ICD-10-CM | POA: Insufficient documentation

## 2016-12-24 DIAGNOSIS — I509 Heart failure, unspecified: Secondary | ICD-10-CM | POA: Diagnosis not present

## 2016-12-24 DIAGNOSIS — R51 Headache: Secondary | ICD-10-CM | POA: Insufficient documentation

## 2016-12-24 DIAGNOSIS — S0990XA Unspecified injury of head, initial encounter: Secondary | ICD-10-CM | POA: Diagnosis not present

## 2016-12-24 DIAGNOSIS — W19XXXA Unspecified fall, initial encounter: Secondary | ICD-10-CM

## 2016-12-24 DIAGNOSIS — S0993XA Unspecified injury of face, initial encounter: Secondary | ICD-10-CM | POA: Diagnosis not present

## 2016-12-24 DIAGNOSIS — I110 Hypertensive heart disease with heart failure: Secondary | ICD-10-CM | POA: Diagnosis not present

## 2016-12-24 LAB — CBC WITH DIFFERENTIAL/PLATELET
Basophils Absolute: 0 10*3/uL (ref 0.0–0.1)
Basophils Relative: 0 %
EOS ABS: 0.1 10*3/uL (ref 0.0–0.7)
EOS PCT: 1 %
HCT: 39.2 % (ref 36.0–46.0)
Hemoglobin: 12.9 g/dL (ref 12.0–15.0)
LYMPHS ABS: 1.3 10*3/uL (ref 0.7–4.0)
LYMPHS PCT: 23 %
MCH: 28.9 pg (ref 26.0–34.0)
MCHC: 32.9 g/dL (ref 30.0–36.0)
MCV: 87.7 fL (ref 78.0–100.0)
MONO ABS: 0.6 10*3/uL (ref 0.1–1.0)
Monocytes Relative: 10 %
Neutro Abs: 3.8 10*3/uL (ref 1.7–7.7)
Neutrophils Relative %: 66 %
PLATELETS: 241 10*3/uL (ref 150–400)
RBC: 4.47 MIL/uL (ref 3.87–5.11)
RDW: 13.1 % (ref 11.5–15.5)
WBC: 5.8 10*3/uL (ref 4.0–10.5)

## 2016-12-24 LAB — COMPREHENSIVE METABOLIC PANEL
ALT: 23 U/L (ref 14–54)
ANION GAP: 9 (ref 5–15)
AST: 24 U/L (ref 15–41)
Albumin: 3.8 g/dL (ref 3.5–5.0)
Alkaline Phosphatase: 53 U/L (ref 38–126)
BUN: 12 mg/dL (ref 6–20)
CALCIUM: 9.2 mg/dL (ref 8.9–10.3)
CO2: 29 mmol/L (ref 22–32)
CREATININE: 0.88 mg/dL (ref 0.44–1.00)
Chloride: 99 mmol/L — ABNORMAL LOW (ref 101–111)
GFR, EST NON AFRICAN AMERICAN: 59 mL/min — AB (ref >60)
Glucose, Bld: 105 mg/dL — ABNORMAL HIGH (ref 65–99)
Potassium: 3.4 mmol/L — ABNORMAL LOW (ref 3.5–5.1)
Sodium: 137 mmol/L (ref 135–145)
TOTAL PROTEIN: 6.6 g/dL (ref 6.5–8.1)
Total Bilirubin: 0.3 mg/dL (ref 0.3–1.2)

## 2016-12-24 MED ORDER — TETRACAINE HCL 0.5 % OP SOLN
2.0000 [drp] | Freq: Once | OPHTHALMIC | Status: DC
  Filled 2016-12-24: qty 2

## 2016-12-24 NOTE — ED Triage Notes (Signed)
Pt. Stated, I went to Dr. Frutoso Chaserawford's office on the day I fell , and they said if it is worse go toER.

## 2016-12-24 NOTE — ED Notes (Signed)
Pt. Stated, My vision is different from my eyes.

## 2016-12-24 NOTE — ED Notes (Signed)
Patient came out asking about the plan of care and results of scans.  Dr. Dalene SeltzerSchlossman notified and patient told she will be in as soon as she can

## 2016-12-24 NOTE — ED Triage Notes (Signed)
Pt. Stated, I fell and slipped on ice on Tuesday but couldn't come at that time.  My office said to come here. I hit my head.  Pt. Stated, my eye, and head hurts.  I've been some dizzy.

## 2016-12-24 NOTE — ED Provider Notes (Signed)
[facility]-EMERGENCY DEPT Provider Note   CSN: [id number] Arrival date & time: 12/24/16  1320     History   Chief Complaint Chief Complaint  Patient presents with  . Fall  . Eye Pain  . Headache    HPI Claudia [name] is a 83 y.o. female.  HPI   Tuesday was walking over ice and slipped on cement and hit on left shoulder and left head, put ice on it. Did see Dr that day.  No LOC. No blood thinners. Headache worsening and "just not feeling right". Called them and they recommended going to ED. No other injuries from fall. Mild shoulder pain but able to move it ok.   Throbbing headache left side of head and left face.  6/10, worse getting up and moving.  Haven't been able to sleep at night due to pain.  Not feeling right and hurting.  Nausea.  No vomiting. Feeling generalized weakness.  Feels like vision is blurred in left eye over last 2 days. No double vision.  Pain around eye and sometimes deep but not in eye.   Past Medical History:  Diagnosis Date  . Back pain   . Cataracts, both eyes   . CHF (congestive heart failure) (HCC)   . Chronic narcotic use   . Chronic pain   . Coronary artery disease   . Hyperlipidemia   . Hypertension   . Mitral valve prolapse   . Osteoarthritis   . Osteoporosis   . Pain management   . Reflux     Patient Active Problem List   Diagnosis Date Noted  . Routine general medical examination at a health care facility 12/19/2016  . Insomnia 06/19/2015  . Chronic narcotic use 12/13/2013  . Chronic back pain 12/13/2013  . Hypertension     Past Surgical History:  Procedure Laterality Date  . ABDOMINAL HYSTERECTOMY    . APPENDECTOMY    . BLADDER SURGERY    . BREAST SURGERY    . SHOULDER SURGERY     Right    OB History    No data available       Home Medications    Prior to Admission medications   Medication Sig Start Date End Date Taking? Authorizing Provider  cloNIDine (CATAPRES) 0.1 MG tablet Take 1 tablet (0.1 mg total) by mouth  daily as needed (for BP >180 or >100). 08/15/16  Yes [name], MD  diltiazem (CARDIZEM CD) 120 MG 24 hr capsule TAKE TWO CAPSULES BY MOUTH DAILY 10/10/16  Yes [name], MD  docusate sodium 100 MG CAPS Take 100 mg by mouth daily. 12/15/13  Yes Renford Dills, MD  polyethylene glycol powder [facility]) powder Take 17 gm twice a day as needed 04/20/16  Yes [name], MD  simvastatin (ZOCOR) 20 MG tablet TAKE 1 TABLET BY MOUTH EVERY EVENING 12/05/16  Yes [name], MD  triamterene-hydrochlorothiazide (MAXZIDE-25) 37.5-25 MG tablet TAKE ONE (1) TABLET BY MOUTH EVERY DAY 08/15/16  Yes [name], MD  esomeprazole (NEXIUM) 40 MG capsule Take 1 capsule (40 mg total) by mouth daily. Patient not taking: Reported on 12/24/2016 12/19/16   [name], MD  fentaNYL (DURAGESIC - DOSED MCG/HR) 100 MCG/HR Place 100 mcg onto the skin every other day.     Historical Provider, MD  LORazepam (ATIVAN) 0.5 MG tablet Take 1 tablet (0.5 mg total) by mouth every 8 (eight) hours as needed. Patient not taking: Reported on 12/24/2016 09/14/16   Austin Miles  Okey Duprerawford, MD  nitroGLYCERIN (NITROSTAT) 0.4 MG SL tablet Place 0.4 mg under the tongue every 5 (five) minutes as needed for chest pain.    Historical Provider, MD    Family History Family History  Problem Relation Age of Onset  . Hypertension Mother   . Diabetes Mother   . Heart disease Father   . Hypertension Father   . Diabetes Father     Social History Social History  Substance Use Topics  . Smoking status: Never Smoker  . Smokeless tobacco: Never Used  . Alcohol use No     Allergies   Amitiza [lubiprostone]; Benadryl [diphenhydramine]; Ultram [tramadol]; Aspirin; Ibuprofen; and Sulfa antibiotics   Review of Systems Review of Systems  Constitutional: Positive for fatigue. Negative for fever.  HENT: Negative for congestion and sore throat.   Eyes: Positive for visual disturbance (reports  vision L eye seems blurred, no double vision, no visual field deficit).  Respiratory: Negative for cough and shortness of breath.   Cardiovascular: Negative for chest pain.  Gastrointestinal: Positive for nausea. Negative for abdominal pain and vomiting.  Genitourinary: Negative for difficulty urinating and dysuria.  Musculoskeletal: Positive for neck pain (reports chronic, unchanged). Negative for back pain.  Skin: Negative for rash.  Neurological: Positive for dizziness, light-headedness and headaches. Negative for syncope, facial asymmetry, speech difficulty, weakness and numbness.     Physical Exam Updated Vital Signs BP (!) 152/60   Pulse 66   Temp 98.9 F (37.2 C) (Oral)   Resp 17   Ht 4\' 11"  (1.499 m)   Wt 109 lb (49.4 kg)   SpO2 98%   BMI 22.02 kg/m   Physical Exam  Constitutional: She is oriented to person, place, and time. She appears well-developed and well-nourished. No distress.  HENT:  Head: Normocephalic.  Left eye with periorbital contusion  Eyes: Conjunctivae and EOM are normal. Pupils are equal, round, and reactive to light.  Normal peripheral vision Vision 20/25 on right, 20/30 on left (pt reports baseline asymmetry)   Neck: Normal range of motion.  Cardiovascular: Normal rate, regular rhythm, normal heart sounds and intact distal pulses.  Exam reveals no gallop and no friction rub.   No murmur heard. Pulmonary/Chest: Effort normal and breath sounds normal. No respiratory distress. She has no wheezes. She has no rales.  Abdominal: Soft. She exhibits no distension. There is no tenderness. There is no guarding.  Musculoskeletal: She exhibits no edema or tenderness.       Cervical back: She exhibits no bony tenderness.  Neurological: She is alert and oriented to person, place, and time. She has normal strength. No cranial nerve deficit or sensory deficit. Coordination normal. GCS eye subscore is 4. GCS verbal subscore is 5. GCS motor subscore is 6.  Skin: Skin  is warm and dry. No rash noted. She is not diaphoretic. No erythema.  Nursing note and vitals reviewed.    ED Treatments / Results  Labs (all labs ordered are listed, but only abnormal results are displayed) Labs Reviewed  COMPREHENSIVE METABOLIC PANEL - Abnormal; Notable for the following:       Result Value   Potassium 3.4 (*)    Chloride 99 (*)    Glucose, Bld 105 (*)    GFR calc non Af Amer 59 (*)    All other components within normal limits  CBC WITH DIFFERENTIAL/PLATELET    EKG  EKG Interpretation  Date/Time:  Sunday December 24 2016 13:30:53 EDT Ventricular Rate:  75 PR Interval:  136  QRS Duration: 92 QT Interval:  392 QTC Calculation: 437 R Axis:   -49 Text Interpretation:  Normal sinus rhythm Pulmonary disease pattern Incomplete right bundle branch block Left anterior fascicular block Abnormal ECG No significant change since last tracing Confirmed by [facility] MD, Garrin Kirwan ([id number]) on 12/24/2016 1:40:52 PM       Radiology Ct Head Wo Contrast  Result Date: 12/24/2016 CLINICAL DATA:  Fall 6 days ago with left eye bruising and injury to head and face. EXAM: CT HEAD WITHOUT CONTRAST CT MAXILLOFACIAL WITHOUT CONTRAST TECHNIQUE: Multidetector CT imaging of the head and maxillofacial structures were performed using the standard protocol without intravenous contrast. Multiplanar CT image reconstructions of the maxillofacial structures were also generated. COMPARISON:  Head CT 07/22/2015 FINDINGS: CT HEAD FINDINGS Brain: Ventricles, cisterns and other CSF spaces are within normal. There is no mass, mass effect, shift of midline structures or acute hemorrhage. There is mild chronic ischemic microvascular disease present. No evidence of acute infarction. Vascular: Calcified plaque over the cavernous segment of the internal carotid arteries and vertebral arteries bilaterally. Skull: Within normal. Other: None. CT MAXILLOFACIAL FINDINGS Osseous: No facial bone fracture. Periodontal disease  involving the left lower central incisor. Orbits: Normal and symmetric. Sinuses: Paranasal sinuses are well aerated without air-fluid level or significant mucosal membrane thickening. Mastoid air cells are clear. Soft tissues: No significant soft tissue injury. Calcified plaque over the carotid bifurcations right worse than left. IMPRESSION: No acute intracranial findings. Minimal chronic ischemic microvascular disease. No acute facial bone fracture. Moderate atherosclerotic disease involving the carotid bifurcations right worse than left. 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 Electronically Signed   By: Elberta Fortis M.D.   On: 12/24/2016 15:04   Ct Maxillofacial Wo Contrast  Result Date: 12/24/2016 CLINICAL DATA:  Fall 6 days ago with left eye bruising and injury to head and face. EXAM: CT HEAD WITHOUT CONTRAST CT MAXILLOFACIAL WITHOUT CONTRAST TECHNIQUE: Multidetector CT imaging of the head and maxillofacial structures were performed using the standard protocol without intravenous contrast. Multiplanar CT image reconstructions of the maxillofacial structures were also generated. COMPARISON:  Head CT 07/22/2015 FINDINGS: CT HEAD FINDINGS Brain: Ventricles, cisterns and other CSF spaces are within normal. There is no mass, mass effect, shift of midline structures or acute hemorrhage. There is mild chronic ischemic microvascular disease present. No evidence of acute infarction. Vascular: Calcified plaque over the cavernous segment of the internal carotid arteries and vertebral arteries bilaterally. Skull: Within normal. Other: None. CT MAXILLOFACIAL FINDINGS Osseous: No facial bone fracture. 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 Orbits: Normal and symmetric. Sinuses: Paranasal sinuses are well aerated without air-fluid level or significant mucosal membrane thickening. Mastoid air cells are clear. Soft tissues: No significant soft tissue injury. Calcified plaque  over the carotid bifurcations right worse than left. IMPRESSION: No acute intracranial findings. Minimal chronic ischemic microvascular disease. No acute facial bone fracture. Moderate atherosclerotic disease involving the carotid bifurcations right worse than left. 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 Electronically Signed   By: Elberta Fortis M.D.   On: 12/24/2016 15:04    Procedures Procedures (including critical care time)  Medications Ordered in ED Medications - No data to display   Initial Impression / Assessment and Plan / ED Course  I have reviewed the triage vital signs and the nursing notes.  Pertinent labs & imaging results that were available during my care of the patient were reviewed by me and considered in my medical decision making (see  chart for details).     82yo female with history of CAD, CHF, htn, hlpd presents with concern for fall on Tuesday with headache.  Reports visual changes but on testing, appears at baseline with glasses on.  Declines testing of pressures, doubt corneal abrasion. CT head and face done without acute abnormalities, no sign of fracture or bleed.  Labs show no other significant abnormalities. Continuing headache, feeling of nausea may be secondary to concussion. Patient discharged in stable condition with understanding of reasons to return.   Final Clinical Impressions(s) / ED Diagnoses   Final diagnoses:  Fall, initial encounter  Contusion of left periocular region, initial encounter    New Prescriptions Discharge Medication List as of 12/24/2016  5:11 PM       Alvira Monday, MD 12/24/16 2212

## 2016-12-24 NOTE — ED Notes (Signed)
Tonopen and tetracaine at bedside

## 2016-12-24 NOTE — ED Notes (Signed)
Papers reviewed with patient and she verbalizes understanding. SHe refused the tetracaine and tonopen exam she is just reassured that there are no fractures.

## 2016-12-25 NOTE — Telephone Encounter (Signed)
GERD

## 2016-12-25 NOTE — Telephone Encounter (Signed)
Patient went to ER this weekend for same.

## 2016-12-27 NOTE — Telephone Encounter (Signed)
PA started via covermymeds.   Key: [id number]

## 2016-12-28 NOTE — Telephone Encounter (Signed)
PA approved.   Pt informed PA was approved and to contact pharmacy for refill.

## 2017-01-09 ENCOUNTER — Other Ambulatory Visit: Payer: Self-pay | Admitting: Internal Medicine

## 2017-01-09 DIAGNOSIS — M48062 Spinal stenosis, lumbar region with neurogenic claudication: Secondary | ICD-10-CM | POA: Diagnosis not present

## 2017-01-09 DIAGNOSIS — I10 Essential (primary) hypertension: Secondary | ICD-10-CM | POA: Diagnosis not present

## 2017-01-09 DIAGNOSIS — M48061 Spinal stenosis, lumbar region without neurogenic claudication: Secondary | ICD-10-CM | POA: Diagnosis not present

## 2017-02-08 ENCOUNTER — Other Ambulatory Visit: Payer: Self-pay | Admitting: Internal Medicine

## 2017-03-09 ENCOUNTER — Other Ambulatory Visit: Payer: Self-pay | Admitting: Internal Medicine

## 2017-03-26 ENCOUNTER — Other Ambulatory Visit: Payer: Self-pay | Admitting: Internal Medicine

## 2017-03-26 DIAGNOSIS — Z5181 Encounter for therapeutic drug level monitoring: Secondary | ICD-10-CM | POA: Diagnosis not present

## 2017-03-26 DIAGNOSIS — M48061 Spinal stenosis, lumbar region without neurogenic claudication: Secondary | ICD-10-CM | POA: Diagnosis not present

## 2017-03-26 NOTE — Telephone Encounter (Signed)
Routing to greg, please advise, thanks 

## 2017-03-27 NOTE — Telephone Encounter (Signed)
Faxed to [facility]

## 2017-04-06 ENCOUNTER — Other Ambulatory Visit: Payer: Self-pay | Admitting: Internal Medicine

## 2017-05-08 ENCOUNTER — Other Ambulatory Visit: Payer: Self-pay | Admitting: Family

## 2017-05-08 ENCOUNTER — Other Ambulatory Visit: Payer: Self-pay | Admitting: Internal Medicine

## 2017-05-23 ENCOUNTER — Other Ambulatory Visit: Payer: Self-pay | Admitting: Family

## 2017-05-23 NOTE — Telephone Encounter (Signed)
Last refill was 04/25/17 per [facility] with 1 refill left.

## 2017-05-24 NOTE — Telephone Encounter (Signed)
Faxed

## 2017-06-07 ENCOUNTER — Other Ambulatory Visit: Payer: Self-pay | Admitting: Family

## 2017-06-07 ENCOUNTER — Other Ambulatory Visit: Payer: Self-pay | Admitting: Internal Medicine

## 2017-06-20 DIAGNOSIS — I10 Essential (primary) hypertension: Secondary | ICD-10-CM | POA: Diagnosis not present

## 2017-06-20 DIAGNOSIS — M48062 Spinal stenosis, lumbar region with neurogenic claudication: Secondary | ICD-10-CM | POA: Diagnosis not present

## 2017-06-20 DIAGNOSIS — M48061 Spinal stenosis, lumbar region without neurogenic claudication: Secondary | ICD-10-CM | POA: Diagnosis not present

## 2017-06-21 ENCOUNTER — Other Ambulatory Visit: Payer: Self-pay | Admitting: Internal Medicine

## 2017-06-21 ENCOUNTER — Other Ambulatory Visit: Payer: Self-pay | Admitting: Family

## 2017-06-21 NOTE — Telephone Encounter (Signed)
[facility]

## 2017-07-09 ENCOUNTER — Other Ambulatory Visit: Payer: Self-pay | Admitting: Internal Medicine

## 2017-07-09 ENCOUNTER — Other Ambulatory Visit: Payer: Self-pay | Admitting: Family

## 2017-08-06 ENCOUNTER — Other Ambulatory Visit: Payer: Self-pay | Admitting: Internal Medicine

## 2017-09-04 ENCOUNTER — Other Ambulatory Visit: Payer: Self-pay | Admitting: Internal Medicine

## 2017-09-04 ENCOUNTER — Other Ambulatory Visit: Payer: Self-pay | Admitting: Family

## 2017-10-10 ENCOUNTER — Other Ambulatory Visit: Payer: Self-pay | Admitting: Internal Medicine

## 2017-10-10 ENCOUNTER — Telehealth: Payer: Self-pay | Admitting: Internal Medicine

## 2017-10-10 NOTE — Telephone Encounter (Signed)
Duplicate msg already received from pharmacy waiting on MD to address...Raechel Chute/lmb

## 2017-10-10 NOTE — Telephone Encounter (Signed)
Check [place] registry last filled 09/05/2017.Marland Kitchen.Raechel Chute/lmb

## 2017-10-10 NOTE — Telephone Encounter (Signed)
Copied from CRM [phone]. Topic: General - Other >> Oct 10, 2017 10:06 AM Lelon FrohlichGolden, Tashia, RMA wrote: Reason for CRM: pt called and stated that she received a letter form her pharmacy that stated that they are no longer carrying the miralax powder that she was on and would like it changed to lactulose 15 ml solution Her [facility]

## 2017-10-11 NOTE — Telephone Encounter (Signed)
Patient states she spouses was sent not hers, please advise

## 2017-10-24 ENCOUNTER — Telehealth: Payer: Self-pay

## 2017-10-24 NOTE — Telephone Encounter (Signed)
PA started on CoverMyMeds KEY:  [id number]

## 2017-10-26 NOTE — Telephone Encounter (Signed)
PA Denied

## 2017-11-14 DIAGNOSIS — M549 Dorsalgia, unspecified: Secondary | ICD-10-CM | POA: Diagnosis not present

## 2017-11-14 DIAGNOSIS — M48062 Spinal stenosis, lumbar region with neurogenic claudication: Secondary | ICD-10-CM | POA: Diagnosis not present

## 2017-12-06 ENCOUNTER — Other Ambulatory Visit: Payer: Self-pay

## 2017-12-06 MED ORDER — ESOMEPRAZOLE MAGNESIUM 40 MG PO CPDR: 40.0000 mg | DELAYED_RELEASE_CAPSULE | Freq: Every day | ORAL | 0 refills | Status: DC

## 2017-12-07 ENCOUNTER — Other Ambulatory Visit: Payer: Self-pay

## 2017-12-07 MED ORDER — CLONIDINE HCL 0.1 MG PO TABS: ORAL_TABLET | ORAL | 1 refills | Status: DC

## 2017-12-19 ENCOUNTER — Other Ambulatory Visit (INDEPENDENT_AMBULATORY_CARE_PROVIDER_SITE_OTHER): Payer: Medicare Other

## 2017-12-19 ENCOUNTER — Encounter: Payer: Self-pay | Admitting: Internal Medicine

## 2017-12-19 ENCOUNTER — Ambulatory Visit (INDEPENDENT_AMBULATORY_CARE_PROVIDER_SITE_OTHER): Payer: Medicare Other | Admitting: Internal Medicine

## 2017-12-19 VITALS — BP 120/68 | HR 65 | Temp 98.4°F | Ht 59.0 in | Wt 114.0 lb

## 2017-12-19 DIAGNOSIS — F5101 Primary insomnia: Secondary | ICD-10-CM | POA: Diagnosis not present

## 2017-12-19 DIAGNOSIS — E785 Hyperlipidemia, unspecified: Secondary | ICD-10-CM

## 2017-12-19 DIAGNOSIS — Z23 Encounter for immunization: Secondary | ICD-10-CM | POA: Diagnosis not present

## 2017-12-19 DIAGNOSIS — Z0000 Encounter for general adult medical examination without abnormal findings: Secondary | ICD-10-CM | POA: Diagnosis not present

## 2017-12-19 DIAGNOSIS — I10 Essential (primary) hypertension: Secondary | ICD-10-CM

## 2017-12-19 LAB — COMPREHENSIVE METABOLIC PANEL
ALK PHOS: 63 U/L (ref 39–117)
ALT: 10 U/L (ref 0–35)
AST: 12 U/L (ref 0–37)
Albumin: 4 g/dL (ref 3.5–5.2)
BILIRUBIN TOTAL: 0.5 mg/dL (ref 0.2–1.2)
BUN: 11 mg/dL (ref 6–23)
CALCIUM: 9.7 mg/dL (ref 8.4–10.5)
CO2: 34 meq/L — AB (ref 19–32)
Chloride: 102 mEq/L (ref 96–112)
Creatinine, Ser: 0.79 mg/dL (ref 0.40–1.20)
GFR: 73.5 mL/min (ref >60.00)
Glucose, Bld: 114 mg/dL — ABNORMAL HIGH (ref 70–99)
Potassium: 3.6 mEq/L (ref 3.5–5.1)
Sodium: 142 mEq/L (ref 135–145)
TOTAL PROTEIN: 6.7 g/dL (ref 6.0–8.3)

## 2017-12-19 LAB — CBC
HEMATOCRIT: 37.9 % (ref 36.0–46.0)
HEMOGLOBIN: 12.9 g/dL (ref 12.0–15.0)
MCHC: 34 g/dL (ref 30.0–36.0)
MCV: 86 fl (ref 78.0–100.0)
PLATELETS: 257 10*3/uL (ref 150.0–400.0)
RBC: 4.4 Mil/uL (ref 3.87–5.11)
RDW: 13.6 % (ref 11.5–15.5)
WBC: 5.7 10*3/uL (ref 4.0–10.5)

## 2017-12-19 LAB — LIPID PANEL
Cholesterol: 145 mg/dL (ref 0–200)
HDL: 77.3 mg/dL (ref >39.00)
LDL Cholesterol: 55 mg/dL (ref 0–99)
NonHDL: 67.51
TRIGLYCERIDES: 63 mg/dL (ref 0.0–149.0)
Total CHOL/HDL Ratio: 2
VLDL: 12.6 mg/dL (ref 0.0–40.0)

## 2017-12-19 LAB — TSH: TSH: 1.21 u[IU]/mL (ref 0.35–4.50)

## 2017-12-19 NOTE — Progress Notes (Signed)
   Subjective:    Patient ID: Claudia [name], female    DOB: 02-27-1931, 84 y.o.   MRN: [id number]  HPI Here for medicare wellness, no new complaints. Please see A/P for status and treatment of chronic medical problems.   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 The patient's medical and social history - reviewed today no changes 2. Their use of alcohol, tobacco or illicit drugs 3. Their current medications and supplements 4. The patient's functional ability including ADL's, fall risks, home safety risks and hearing or visual impairment. 5. Diet and physical activities 6. Evidence for depression or mood disorders 7. Care team reviewed and updated (available in snapshot)  Review of Systems  Constitutional: Negative.   HENT: Negative.   Eyes: Negative.   Respiratory: Negative for cough, chest tightness and shortness of breath.   Cardiovascular: Negative for chest pain, palpitations and leg swelling.  Gastrointestinal: Negative for abdominal distention, abdominal pain, constipation, diarrhea, nausea and vomiting.  Musculoskeletal: Negative.     Skin: Negative.   Neurological: Negative.   Psychiatric/Behavioral: Negative for confusion, dysphoric mood and sleep disturbance. The patient is nervous/anxious. The patient is not hyperactive.       Objective:   Physical Exam  Constitutional: She is oriented to person, place, and time. She appears well-developed and well-nourished.  HENT:  Head: Normocephalic and atraumatic.  Eyes: EOM are normal.  Neck: Normal range of motion.  Cardiovascular: Normal rate and regular rhythm.  Pulmonary/Chest: Effort normal and breath sounds normal. No respiratory distress. She has no wheezes. She has no rales.  Abdominal: Soft. Bowel sounds are normal. She exhibits no distension. There is no tenderness. There is no rebound.  Musculoskeletal: She exhibits no edema.  Neurological: She is alert and oriented to person, place, and time. Coordination normal.  Skin: Skin is warm and dry.  Psychiatric: She has a normal mood and affect.   Vitals:   12/19/17 1041  BP: 120/68  Pulse: 65  Temp: 98.4 F (36.9 C)  TempSrc: Oral  SpO2: 97%  Weight: 114 lb (51.7 kg)  Height: 4\' 11"  (1.499 m)      Assessment & Plan:  Flu shot given at visit

## 2017-12-19 NOTE — Patient Instructions (Signed)

## 2017-12-20 ENCOUNTER — Encounter: Payer: Self-pay | Admitting: Internal Medicine

## 2017-12-20 ENCOUNTER — Telehealth: Payer: Self-pay | Admitting: Internal Medicine

## 2017-12-20 DIAGNOSIS — E785 Hyperlipidemia, unspecified: Secondary | ICD-10-CM | POA: Insufficient documentation

## 2017-12-20 NOTE — Assessment & Plan Note (Signed)
She is aware of the risk of memory change, dependence, risk of falls with lorazepam long term usage which is increased as she is also on opioids and she accepts this risk and wishes to continue given the significant increase in QOL.

## 2017-12-20 NOTE — Telephone Encounter (Signed)
Copied from CRM [phone]. Topic: Quick Communication - See Telephone Encounter >> Dec 20, 2017 12:51 PM Arlyss Gandyichardson, Yonas Bunda N, NT wrote: CRM for notification. See Telephone encounter for: Pts insurance company sent the pt a letter and states they can no longer give the pt esomeprazole (NEXIUM). Pt would like to see if something else can be ordered to replace this for next month.   12/20/17.

## 2017-12-20 NOTE — Assessment & Plan Note (Signed)
Tetanus and pneumonia up to date. Flu given at visit. Counseled about shingrix. Counseled about sun safety and mole surveillance. Aged out of mammogram and colonoscopy. Given 10 year screening recommendations.

## 2017-12-20 NOTE — Assessment & Plan Note (Signed)
Taking diltiazem and hctz and BP at goal. Checking CMP and adjust as needed.

## 2017-12-20 NOTE — Assessment & Plan Note (Signed)
Taking simvastatin 20 mg daily, checking lipid panel and adjust as needed.  

## 2017-12-21 NOTE — Telephone Encounter (Signed)
PA started on CoverMyMeds KEY: [id number]

## 2017-12-21 NOTE — Telephone Encounter (Signed)
Contacted patient, will start a PA for the Nexium

## 2017-12-24 NOTE — Telephone Encounter (Signed)
PA was denied for Nexium states patient needs to have tried omeprazole first. Please advise. Thank you

## 2017-12-25 ENCOUNTER — Other Ambulatory Visit: Payer: Self-pay

## 2017-12-25 MED ORDER — OMEPRAZOLE 40 MG PO CPDR: 40.0000 mg | DELAYED_RELEASE_CAPSULE | Freq: Every day | ORAL | 3 refills | Status: DC

## 2017-12-25 NOTE — Telephone Encounter (Signed)
Patient informed and re-sent to [place]

## 2017-12-25 NOTE — Telephone Encounter (Signed)
Sent in omeprazole 

## 2017-12-27 ENCOUNTER — Other Ambulatory Visit: Payer: Self-pay | Admitting: Internal Medicine

## 2018-02-13 DIAGNOSIS — M48061 Spinal stenosis, lumbar region without neurogenic claudication: Secondary | ICD-10-CM | POA: Diagnosis not present

## 2018-02-13 DIAGNOSIS — M545 Low back pain: Secondary | ICD-10-CM | POA: Diagnosis not present

## 2018-02-13 DIAGNOSIS — M549 Dorsalgia, unspecified: Secondary | ICD-10-CM | POA: Diagnosis not present

## 2018-02-13 DIAGNOSIS — M48062 Spinal stenosis, lumbar region with neurogenic claudication: Secondary | ICD-10-CM | POA: Diagnosis not present

## 2018-02-13 DIAGNOSIS — I10 Essential (primary) hypertension: Secondary | ICD-10-CM | POA: Diagnosis not present

## 2018-02-28 ENCOUNTER — Other Ambulatory Visit: Payer: Self-pay | Admitting: Internal Medicine

## 2018-03-01 ENCOUNTER — Other Ambulatory Visit: Payer: Self-pay

## 2018-03-01 ENCOUNTER — Telehealth: Payer: Self-pay | Admitting: Internal Medicine

## 2018-03-01 MED ORDER — LORAZEPAM 0.5 MG PO TABS: 0.5000 mg | ORAL_TABLET | Freq: Every day | ORAL | 5 refills | Status: DC | PRN

## 2018-03-01 NOTE — Telephone Encounter (Signed)
Control database checked last refill: 12/12/2017 LOV: 12/19/2017

## 2018-03-01 NOTE — Telephone Encounter (Signed)
Called pharmacy just wanted to make sure Dr. Okey Dupre knew that patient was also on fentanyl. Let them know that she was aware and rx is being filled

## 2018-03-01 NOTE — Telephone Encounter (Signed)
Copied from CRM [id number]. Topic: Quick Communication - See Telephone Encounter >> Mar 01, 2018  3:47 PM Eston Mould B wrote: CRM for notification. See Telephone encounter for: 03/01/18.   Onalee Hua from [facility] called to check on rx for the pt  due to her age and other medications she is taking,  he wants clarification  on lorazepam   [facility] [address] ([address] [place] - [address] [phone] (Phone) [phone] (Fax)

## 2018-03-04 ENCOUNTER — Other Ambulatory Visit: Payer: Self-pay | Admitting: Internal Medicine

## 2018-03-05 NOTE — Telephone Encounter (Signed)
Control database checked last refill: 12/12/2017 LOV: 12/19/2017

## 2018-04-03 ENCOUNTER — Telehealth: Payer: Self-pay | Admitting: Internal Medicine

## 2018-04-03 NOTE — Telephone Encounter (Signed)
Copied from CRM [id number]. Topic: Quick Communication - See Telephone Encounter >> Apr 03, 2018 12:04 PM Arlyss Gandyichardson, Kadince Boxley N, NT wrote: CRM for notification. See Telephone encounter for: 04/03/18. Pt calling and states the tablets for omeprazole (PRILOSEC) 40 MG capsule are to big for her to take and would like to see if she can be changed back to the generic brand of protonixs because the pills are smaller.

## 2018-04-04 MED ORDER — PANTOPRAZOLE SODIUM 40 MG PO TBEC: 40.0000 mg | DELAYED_RELEASE_TABLET | Freq: Every day | ORAL | 3 refills | Status: DC

## 2018-04-04 NOTE — Telephone Encounter (Signed)
Patient informed. 

## 2018-04-04 NOTE — Telephone Encounter (Signed)
Sent in

## 2018-04-05 ENCOUNTER — Other Ambulatory Visit: Payer: Self-pay | Admitting: Internal Medicine

## 2018-04-08 ENCOUNTER — Other Ambulatory Visit: Payer: Self-pay | Admitting: Internal Medicine

## 2018-05-08 ENCOUNTER — Other Ambulatory Visit: Payer: Self-pay

## 2018-05-15 DIAGNOSIS — M48061 Spinal stenosis, lumbar region without neurogenic claudication: Secondary | ICD-10-CM | POA: Diagnosis not present

## 2018-05-15 DIAGNOSIS — I10 Essential (primary) hypertension: Secondary | ICD-10-CM | POA: Diagnosis not present

## 2018-05-15 DIAGNOSIS — M545 Low back pain: Secondary | ICD-10-CM | POA: Diagnosis not present

## 2018-05-15 DIAGNOSIS — Z5181 Encounter for therapeutic drug level monitoring: Secondary | ICD-10-CM | POA: Diagnosis not present

## 2018-05-15 DIAGNOSIS — M4316 Spondylolisthesis, lumbar region: Secondary | ICD-10-CM | POA: Diagnosis not present

## 2018-05-15 DIAGNOSIS — Z79891 Long term (current) use of opiate analgesic: Secondary | ICD-10-CM | POA: Diagnosis not present

## 2018-05-15 DIAGNOSIS — Z79899 Other long term (current) drug therapy: Secondary | ICD-10-CM | POA: Diagnosis not present

## 2018-06-07 ENCOUNTER — Other Ambulatory Visit: Payer: Self-pay | Admitting: Internal Medicine

## 2018-07-05 ENCOUNTER — Other Ambulatory Visit: Payer: Self-pay | Admitting: Internal Medicine

## 2018-07-30 ENCOUNTER — Other Ambulatory Visit: Payer: Self-pay | Admitting: Internal Medicine

## 2018-08-05 DIAGNOSIS — M48061 Spinal stenosis, lumbar region without neurogenic claudication: Secondary | ICD-10-CM | POA: Diagnosis not present

## 2018-08-05 DIAGNOSIS — M4316 Spondylolisthesis, lumbar region: Secondary | ICD-10-CM | POA: Diagnosis not present

## 2018-08-05 DIAGNOSIS — I10 Essential (primary) hypertension: Secondary | ICD-10-CM | POA: Diagnosis not present

## 2018-08-26 ENCOUNTER — Other Ambulatory Visit: Payer: Self-pay

## 2018-09-11 ENCOUNTER — Other Ambulatory Visit: Payer: Self-pay

## 2018-09-11 MED ORDER — PANTOPRAZOLE SODIUM 40 MG PO TBEC: 40.0000 mg | DELAYED_RELEASE_TABLET | Freq: Every day | ORAL | 1 refills | Status: DC

## 2018-09-11 MED ORDER — SIMVASTATIN 20 MG PO TABS: ORAL_TABLET | ORAL | 1 refills | Status: DC

## 2018-09-11 MED ORDER — TRIAMTERENE-HCTZ 37.5-25 MG PO TABS: ORAL_TABLET | ORAL | 1 refills | Status: DC

## 2018-09-11 MED ORDER — DILTIAZEM HCL ER COATED BEADS 120 MG PO CP24: 240.0000 mg | ORAL_CAPSULE | Freq: Every day | ORAL | 1 refills | Status: DC

## 2018-09-23 ENCOUNTER — Ambulatory Visit (INDEPENDENT_AMBULATORY_CARE_PROVIDER_SITE_OTHER): Payer: Medicare Other | Admitting: Internal Medicine

## 2018-09-23 ENCOUNTER — Encounter: Payer: Self-pay | Admitting: Internal Medicine

## 2018-09-23 VITALS — BP 120/70 | HR 73 | Temp 98.0°F | Ht 59.0 in | Wt 115.0 lb

## 2018-09-23 DIAGNOSIS — R300 Dysuria: Secondary | ICD-10-CM | POA: Diagnosis not present

## 2018-09-23 LAB — POCT URINALYSIS DIPSTICK
Bilirubin, UA: NEGATIVE
Blood, UA: NEGATIVE
Glucose, UA: NEGATIVE
Ketones, UA: NEGATIVE
Nitrite, UA: NEGATIVE
Protein, UA: NEGATIVE
SPEC GRAV UA: 1.02 (ref 1.010–1.025)
Urobilinogen, UA: 0.2 E.U./dL
pH, UA: 6 (ref 5.0–8.0)

## 2018-09-23 MED ORDER — NITROFURANTOIN MONOHYD MACRO 100 MG PO CAPS: 100.0000 mg | ORAL_CAPSULE | Freq: Two times a day (BID) | ORAL | 0 refills | Status: DC

## 2018-09-23 NOTE — Patient Instructions (Signed)
We have sent in nitrofurantoin to take 1 pill twice a day for 1 week.

## 2018-09-23 NOTE — Progress Notes (Signed)
   Subjective:    Patient ID: Claudia [name], female    DOB: 27-Oct-1930, 84 y.o.   MRN: [id number]  HPI The patient is an 84 YO female coming in for urinary symptoms. Started about 2-3 days ago. She is having burning with urination. Denies fevers or chills. Denies abdominal pain. Is having more urgency and frequency as well. Has tried cranberry juice and more fluids.   Review of Systems  Constitutional: Negative.   Respiratory: Negative.   Cardiovascular: Negative.   Gastrointestinal: Positive for abdominal pain. Negative for abdominal distention, constipation, diarrhea, nausea and vomiting.  Genitourinary: Positive for dysuria, frequency and urgency.  Musculoskeletal: Negative.   Skin: Negative.       Objective:   Physical Exam Constitutional:      Appearance: She is well-developed.  HENT:     Head: Normocephalic and atraumatic.  Neck:     Musculoskeletal: Normal range of motion.  Cardiovascular:     Rate and Rhythm: Normal rate and regular rhythm.  Pulmonary:     Effort: Pulmonary effort is normal. No respiratory distress.     Breath sounds: Normal breath sounds. No wheezing or rales.  Abdominal:     General: Bowel sounds are normal. There is no distension.     Palpations: Abdomen is soft.     Tenderness: There is abdominal tenderness. There is no rebound.  Skin:    General: Skin is warm and dry.  Neurological:     Mental Status: She is alert and oriented to person, place, and time.     Coordination: Coordination normal.    Vitals:   09/23/18 1459  BP: 120/70  Pulse: 73  Temp: 98 F (36.7 C)  TempSrc: Oral  SpO2: 97%  Weight: 115 lb (52.2 kg)  Height: 4\' 11"  (1.499 m)      Assessment & Plan:

## 2018-09-23 NOTE — Assessment & Plan Note (Signed)
U/A done in office and consistent with infection. Rx for nitrofurantoin to clear.

## 2018-10-24 DIAGNOSIS — M545 Low back pain: Secondary | ICD-10-CM | POA: Diagnosis not present

## 2018-10-24 DIAGNOSIS — M48061 Spinal stenosis, lumbar region without neurogenic claudication: Secondary | ICD-10-CM | POA: Diagnosis not present

## 2018-10-24 DIAGNOSIS — I10 Essential (primary) hypertension: Secondary | ICD-10-CM | POA: Diagnosis not present

## 2018-10-25 ENCOUNTER — Emergency Department (HOSPITAL_COMMUNITY): Payer: Medicare Other

## 2018-10-25 ENCOUNTER — Other Ambulatory Visit: Payer: Self-pay

## 2018-10-25 ENCOUNTER — Emergency Department (HOSPITAL_COMMUNITY)
Admission: EM | Admit: 2018-10-25 | Discharge: 2018-10-25 | Disposition: A | Discharge status: Home / Self Care | Payer: Medicare Other | Attending: Emergency Medicine | Admitting: Emergency Medicine

## 2018-10-25 ENCOUNTER — Encounter (HOSPITAL_COMMUNITY): Payer: Self-pay

## 2018-10-25 DIAGNOSIS — G8929 Other chronic pain: Secondary | ICD-10-CM | POA: Diagnosis not present

## 2018-10-25 DIAGNOSIS — S79911A Unspecified injury of right hip, initial encounter: Secondary | ICD-10-CM | POA: Diagnosis not present

## 2018-10-25 DIAGNOSIS — R112 Nausea with vomiting, unspecified: Secondary | ICD-10-CM

## 2018-10-25 DIAGNOSIS — Q632 Ectopic kidney: Secondary | ICD-10-CM | POA: Diagnosis not present

## 2018-10-25 DIAGNOSIS — M545 Low back pain, unspecified: Secondary | ICD-10-CM

## 2018-10-25 DIAGNOSIS — M25551 Pain in right hip: Secondary | ICD-10-CM | POA: Diagnosis not present

## 2018-10-25 DIAGNOSIS — Z79899 Other long term (current) drug therapy: Secondary | ICD-10-CM | POA: Diagnosis not present

## 2018-10-25 DIAGNOSIS — I509 Heart failure, unspecified: Secondary | ICD-10-CM | POA: Diagnosis not present

## 2018-10-25 DIAGNOSIS — I2510 Atherosclerotic heart disease of native coronary artery without angina pectoris: Secondary | ICD-10-CM | POA: Diagnosis not present

## 2018-10-25 DIAGNOSIS — I110 Hypertensive heart disease with heart failure: Secondary | ICD-10-CM | POA: Insufficient documentation

## 2018-10-25 DIAGNOSIS — R1114 Bilious vomiting: Secondary | ICD-10-CM | POA: Diagnosis not present

## 2018-10-25 DIAGNOSIS — N281 Cyst of kidney, acquired: Secondary | ICD-10-CM | POA: Diagnosis not present

## 2018-10-25 DIAGNOSIS — W06XXXA Fall from bed, initial encounter: Secondary | ICD-10-CM | POA: Diagnosis not present

## 2018-10-25 DIAGNOSIS — R197 Diarrhea, unspecified: Secondary | ICD-10-CM | POA: Diagnosis not present

## 2018-10-25 LAB — COMPREHENSIVE METABOLIC PANEL
ALT: 14 U/L (ref 0–44)
AST: 19 U/L (ref 15–41)
Albumin: 3.9 g/dL (ref 3.5–5.0)
Alkaline Phosphatase: 60 U/L (ref 38–126)
Anion gap: 10 (ref 5–15)
BUN: 8 mg/dL (ref 8–23)
CO2: 30 mmol/L (ref 22–32)
Calcium: 9.3 mg/dL (ref 8.9–10.3)
Chloride: 99 mmol/L (ref 98–111)
Creatinine, Ser: 0.83 mg/dL (ref 0.44–1.00)
GFR calc Af Amer: >60 mL/min (ref >60)
GFR calc non Af Amer: >60 mL/min (ref >60)
Glucose, Bld: 125 mg/dL — ABNORMAL HIGH (ref 70–99)
Potassium: 3.2 mmol/L — ABNORMAL LOW (ref 3.5–5.1)
Sodium: 139 mmol/L (ref 135–145)
Total Bilirubin: 0.7 mg/dL (ref 0.3–1.2)
Total Protein: 7.1 g/dL (ref 6.5–8.1)

## 2018-10-25 LAB — I-STAT CHEM 8, ED
BUN: 9 mg/dL (ref 8–23)
CHLORIDE: 97 mmol/L — AB (ref 98–111)
Calcium, Ion: 1.16 mmol/L (ref 1.15–1.40)
Creatinine, Ser: 0.8 mg/dL (ref 0.44–1.00)
Glucose, Bld: 120 mg/dL — ABNORMAL HIGH (ref 70–99)
HCT: 38 % (ref 36.0–46.0)
Hemoglobin: 12.9 g/dL (ref 12.0–15.0)
Potassium: 3.2 mmol/L — ABNORMAL LOW (ref 3.5–5.1)
Sodium: 138 mmol/L (ref 135–145)
TCO2: 32 mmol/L (ref 22–32)

## 2018-10-25 LAB — CBC WITH DIFFERENTIAL/PLATELET
ABS IMMATURE GRANULOCYTES: 0.01 10*3/uL (ref 0.00–0.07)
Basophils Absolute: 0 10*3/uL (ref 0.0–0.1)
Basophils Relative: 0 %
EOS PCT: 2 %
Eosinophils Absolute: 0.1 10*3/uL (ref 0.0–0.5)
HCT: 38.8 % (ref 36.0–46.0)
Hemoglobin: 12.5 g/dL (ref 12.0–15.0)
Immature Granulocytes: 0 %
Lymphocytes Relative: 13 %
Lymphs Abs: 0.7 10*3/uL (ref 0.7–4.0)
MCH: 27.7 pg (ref 26.0–34.0)
MCHC: 32.2 g/dL (ref 30.0–36.0)
MCV: 85.8 fL (ref 80.0–100.0)
Monocytes Absolute: 0.5 10*3/uL (ref 0.1–1.0)
Monocytes Relative: 9 %
Neutro Abs: 4.4 10*3/uL (ref 1.7–7.7)
Neutrophils Relative %: 76 %
PLATELETS: 236 10*3/uL (ref 150–400)
RBC: 4.52 MIL/uL (ref 3.87–5.11)
RDW: 13.2 % (ref 11.5–15.5)
WBC: 5.7 10*3/uL (ref 4.0–10.5)
nRBC: 0 % (ref 0.0–0.2)

## 2018-10-25 LAB — LIPASE, BLOOD: Lipase: 26 U/L (ref 11–51)

## 2018-10-25 MED ORDER — SODIUM CHLORIDE 0.9 % IV BOLUS
1000.0000 mL | Freq: Once | INTRAVENOUS | Status: AC
  Administered 2018-10-25: 1000 mL via INTRAVENOUS

## 2018-10-25 MED ORDER — ONDANSETRON HCL 4 MG/2ML IJ SOLN
4.0000 mg | Freq: Once | INTRAMUSCULAR | Status: AC
  Administered 2018-10-25: 4 mg via INTRAVENOUS
  Filled 2018-10-25: qty 2

## 2018-10-25 MED ORDER — ONDANSETRON 4 MG PO TBDP: 4.0000 mg | ORAL_TABLET | Freq: Three times a day (TID) | ORAL | 0 refills | Status: DC | PRN

## 2018-10-25 MED ORDER — IOHEXOL 300 MG/ML  SOLN
100.0000 mL | Freq: Once | INTRAMUSCULAR | Status: AC | PRN
  Administered 2018-10-25: 100 mL via INTRAVENOUS

## 2018-10-25 NOTE — Discharge Instructions (Signed)
Return for sudden worsening pain numbness or weakness to your leg.  Inability to urinate.  Return for fever inability to eat or drink.

## 2018-10-25 NOTE — ED Triage Notes (Signed)
Pt presents for evaluation of fall last night with R hip pain. Ambulatory but with pain and difficulty.

## 2018-10-25 NOTE — ED Provider Notes (Signed)
[facility] EMERGENCY DEPARTMENT Provider Note   CSN: [id number] Arrival date & time: 10/25/18  1035     History   Chief Complaint Chief Complaint  Patient presents with  . Fall    HPI Claudia [name] is a 85 y.o. female.  85 yo F with a chief complaint of a fall.  Patient was trying to get in the bed and she lost her balance and fell.  Landed on the right side of her body.  Complaining of right-sided low back pain.  Has a history of chronic right low back pain.  Been complaining of severe pain with walking.  She denies numbness or weakness denies loss of bowel or bladder.  Denies abdominal pain chest pain headache or neck pain.  Review of systems the patient mentioned that she been vomiting.  Vomited prior to the fall.  Describes bilious emesis.  Has a history of prior small bowel obstruction.  The history is provided by the patient and a relative.  Fall  This is a new problem. The current episode started yesterday. The problem occurs constantly. The problem has not changed since onset.Pertinent negatives include no chest pain, no abdominal pain, no headaches and no shortness of breath. Nothing aggravates the symptoms. Nothing relieves the symptoms. She has tried nothing for the symptoms. The treatment provided no relief.    Past Medical History:  Diagnosis Date  . Back pain   . Cataracts, both eyes   . CHF (congestive heart failure) (HCC)   . Chronic narcotic use   . Chronic pain   . Coronary artery disease   . Hyperlipidemia   . Hypertension   . Mitral valve prolapse   . Osteoarthritis   . Osteoporosis   . Pain management   . Reflux     Patient Active Problem List   Diagnosis Date Noted  . Dysuria 09/23/2018  . Hyperlipidemia 12/20/2017  . Routine general medical examination at a health care facility 12/19/2016  . Insomnia 06/19/2015  . Chronic narcotic use 12/13/2013  . Chronic back pain 12/13/2013  . Hypertension     Past Surgical History:   Procedure Laterality Date  . ABDOMINAL HYSTERECTOMY    . APPENDECTOMY    . BLADDER SURGERY    . BREAST SURGERY    . SHOULDER SURGERY     Right     OB History   No obstetric history on file.      Home Medications    Prior to Admission medications   Medication Sig Start Date End Date Taking? Authorizing Provider  cloNIDine (CATAPRES) 0.1 MG tablet TAKE 1 TABLET DAILY FOR BLOOD PRESSURE>180 OR >100. 07/05/18   [name], MD  diltiazem (CARDIZEM CD) 120 MG 24 hr capsule Take 2 capsules (240 mg total) by mouth daily. 09/11/18   [name], MD  docusate sodium 100 MG CAPS Take 100 mg by mouth daily. 12/15/13   Renford Dills, MD  fentaNYL (DURAGESIC - DOSED MCG/HR) 100 MCG/HR Place 100 mcg onto the skin every other day.     [provider]  LORazepam (ATIVAN) 0.5 MG tablet TAKE 1 TABLET EVERY 8 HOURS AS NEEDED. 03/05/18   [name], MD  nitrofurantoin, macrocrystal-monohydrate, (MACROBID) 100 MG capsule Take 1 capsule (100 mg total) by mouth 2 (two) times daily. 09/23/18   [name], MD  nitroGLYCERIN (NITROSTAT) 0.4 MG SL tablet Place 0.4 mg under the tongue every 5 (five) minutes as needed for chest pain.  [provider]  ondansetron (ZOFRAN ODT) 4 MG disintegrating tablet Take 1 tablet (4 mg total) by mouth every 8 (eight) hours as needed for nausea or vomiting. 10/25/18   Melene Plan, DO  pantoprazole (PROTONIX) 40 MG tablet Take 1 tablet (40 mg total) by mouth daily. 09/11/18   [name], MD  polyethylene glycol powder (GLYCOLAX/MIRALAX) powder TAKE 17 GRAMS TWICE A DAY AS NEEDED 09/04/17   [name], MD  simvastatin (ZOCOR) 20 MG tablet TAKE 1 TABLET IN THE P.M. 09/11/18   [name], MD  triamterene-hydrochlorothiazide (MAXZIDE-25) 37.5-25 MG tablet TAKE 1 TABLET EACH DAY. 09/11/18   [name], MD  promethazine (PHENERGAN) 25 MG tablet Take 1 tablet (25 mg total) by mouth every 8  (eight) hours as needed for nausea or vomiting. Patient not taking: Reported on 04/04/2015 11/20/14 04/04/15  Charlestine Night, PA-C    Family History Family History  Problem Relation Age of Onset  . Hypertension Mother   . Diabetes Mother   . Heart disease Father   . Hypertension Father   . Diabetes Father     Social History Social History   Tobacco Use  . Smoking status: Never Smoker  . Smokeless tobacco: Never Used  Substance Use Topics  . Alcohol use: No    Alcohol/week: 0.0 standard drinks  . Drug use: No     Allergies   Amitiza [lubiprostone]; Benadryl [diphenhydramine]; Ultram [tramadol]; Aspirin; Ibuprofen; and Sulfa antibiotics   Review of Systems Review of Systems  Constitutional: Negative for chills and fever.  HENT: Negative for congestion and rhinorrhea.   Eyes: Negative for redness and visual disturbance.  Respiratory: Negative for shortness of breath and wheezing.   Cardiovascular: Negative for chest pain and palpitations.  Gastrointestinal: Positive for constipation, nausea and vomiting. Negative for abdominal pain.  Genitourinary: Negative for dysuria and urgency.  Musculoskeletal: Negative for arthralgias and myalgias.  Skin: Negative for pallor and wound.  Neurological: Negative for dizziness and headaches.     Physical Exam Updated Vital Signs BP (!) 148/66   Pulse 72   Temp 98.3 F (36.8 C) (Oral)   Resp 16   Ht 4\' 11"  (1.499 m)   Wt 50.8 kg   SpO2 96%   BMI 22.62 kg/m   Physical Exam Vitals signs and nursing note reviewed.  Constitutional:      General: She is not in acute distress.    Appearance: She is well-developed. She is not diaphoretic.     Comments: Cachectic  HENT:     Head: Normocephalic and atraumatic.  Eyes:     Pupils: Pupils are equal, round, and reactive to light.  Neck:     Musculoskeletal: Normal range of motion and neck supple.  Cardiovascular:     Rate and Rhythm: Normal rate and regular rhythm.      Heart sounds: No murmur. No friction rub. No gallop.   Pulmonary:     Effort: Pulmonary effort is normal.     Breath sounds: No wheezing or rales.  Abdominal:     General: There is no distension.     Palpations: Abdomen is soft.     Tenderness: There is no abdominal tenderness.  Musculoskeletal:        General: No tenderness.     Comments: Pulse motor and sensation is intact to the right lower extremity.  Reflexes are 2+.  No clonus.  Full range of motion of the right hip without pain.  Mild tenderness to  the right SI joint area.  Skin:    General: Skin is warm and dry.  Neurological:     Mental Status: She is alert and oriented to person, place, and time.  Psychiatric:        Behavior: Behavior normal.      ED Treatments / Results  Labs (all labs ordered are listed, but only abnormal results are displayed) Labs Reviewed  COMPREHENSIVE METABOLIC PANEL - Abnormal; Notable for the following components:      Result Value   Potassium 3.2 (*)    Glucose, Bld 125 (*)    All other components within normal limits  I-STAT CHEM 8, ED - Abnormal; Notable for the following components:   Potassium 3.2 (*)    Chloride 97 (*)    Glucose, Bld 120 (*)    All other components within normal limits  CBC WITH DIFFERENTIAL/PLATELET  LIPASE, BLOOD    EKG None  Radiology Ct Abdomen Pelvis W Contrast  Result Date: 10/25/2018 CLINICAL DATA:  Bilious vomiting and nausea. EXAM: CT ABDOMEN AND PELVIS WITH CONTRAST TECHNIQUE: Multidetector CT imaging of the abdomen and pelvis was performed using the standard protocol following bolus administration of intravenous contrast. CONTRAST:  100mL OMNIPAQUE IOHEXOL 300 MG/ML  SOLN COMPARISON:  April 04, 2015 FINDINGS: Lower chest: No acute abnormality. The heart size is enlarged. Mild atelectasis of posterior lung bases are noted. Hepatobiliary: No focal liver abnormality is seen. No gallstones, gallbladder wall thickening, or biliary dilatation. Pancreas:  Unremarkable. No pancreatic ductal dilatation or surrounding inflammatory changes. Spleen: Normal in size without focal abnormality. Adrenals/Urinary Tract: Bilateral adrenal glands are normal. Bilateral kidney cysts are noted. Ectopic right kidney is identified unchanged compared prior exam. Bladder is fluid-filled unchanged. Stomach/Bowel: There is a small hiatal hernia. The stomach is otherwise normal. The appendix has been surgically removed. No evidence of bowel wall thickening, distention, or inflammatory changes. Vascular/Lymphatic: Aortic atherosclerosis. There is ectasia of the distal thoracic aorta. No enlarged abdominal or pelvic lymph nodes. Reproductive: Status post hysterectomy. No adnexal masses. Other: No abdominal wall hernia or abnormality. No abdominopelvic ascites. Musculoskeletal: Degenerative joint changes of the spine and bilateral hips are identified. IMPRESSION: No acute abnormality identified. There is no bowel obstruction or diverticulitis. Ectopic right kidney unchanged compared prior exam. Electronically Signed   By: Sherian ReinWei-Chen  Lin M.D.   On: 10/25/2018 14:04   Dg Hip Unilat  With Pelvis 2-3 Views Right  Result Date: 10/25/2018 CLINICAL DATA:  Pt presents for evaluation of fall last night in her bedroom with posterior R hip pain. Ambulatory but with pain and difficulty. EXAM: DG HIP (WITH OR WITHOUT PELVIS) 2-3V RIGHT COMPARISON:  None. FINDINGS: No fracture.  No bone lesion. Hip joints, SI joints and symphysis pubis are normally spaced and aligned. No arthropathic changes. There are disc degenerative changes of the visualized lower lumbar spine. Bones are demineralized. There are scattered arterial vascular calcifications. Soft tissues otherwise unremarkable. IMPRESSION: 1. No fracture or acute finding.  No hip joint abnormality. Electronically Signed   By: Amie Portlandavid  Ormond M.D.   On: 10/25/2018 11:09    Procedures Procedures (including critical care time)  Medications Ordered in  ED Medications  sodium chloride 0.9 % bolus 1,000 mL (0 mLs Intravenous Stopped 10/25/18 1338)  ondansetron (ZOFRAN) injection 4 mg (4 mg Intravenous Given 10/25/18 1236)  iohexol (OMNIPAQUE) 300 MG/ML solution 100 mL (100 mLs Intravenous Contrast Given 10/25/18 1348)     Initial Impression / Assessment and Plan / ED Course  I have reviewed the triage vital signs and the nursing notes.  Pertinent labs & imaging results that were available during my care of the patient were reviewed by me and considered in my medical decision making (see chart for details).     85 yo F with a chief complaint of right-sided low back pain.  On my review of systems the patient mentioned that she has been vomiting.  This may be the cause of her fall, may be weak from dehydration from vomiting.  Unfortunately she also has had bilious emesis.  Will obtain a CT scan to further evaluate.  CT scan abdomen pelvis negative for acute pathology.  Patient's renal function is at baseline she is not anemic potassium is mildly low.  Patient was reassessed and able to ambulate without difficulty.  Discharge home.  2:14 PM:  I have discussed the diagnosis/risks/treatment options with the patient and family and believe the pt to be eligible for discharge home to follow-up with PCP. We also discussed returning to the ED immediately if new or worsening sx occur. We discussed the sx which are most concerning (e.g., sudden worsening pain, fever, inability to tolerate by mouth) that necessitate immediate return. Medications administered to the patient during their visit and any new prescriptions provided to the patient are listed below.  Medications given during this visit Medications  sodium chloride 0.9 % bolus 1,000 mL (0 mLs Intravenous Stopped 10/25/18 1338)  ondansetron (ZOFRAN) injection 4 mg (4 mg Intravenous Given 10/25/18 1236)  iohexol (OMNIPAQUE) 300 MG/ML solution 100 mL (100 mLs Intravenous Contrast Given 10/25/18 1348)      The patient appears reasonably screen and/or stabilized for discharge and I doubt any other medical condition or other [facility] requiring further screening, evaluation, or treatment in the ED at this time prior to discharge.    Final Clinical Impressions(s) / ED Diagnoses   Final diagnoses:  Chronic right-sided low back pain without sciatica  Nausea and vomiting in adult    ED Discharge Orders         Ordered    ondansetron (ZOFRAN ODT) 4 MG disintegrating tablet  Every 8 hours PRN     10/25/18 1410           Melene Plan, DO 10/25/18 1414

## 2018-10-25 NOTE — ED Notes (Signed)
Patient transported to CT 

## 2018-11-08 ENCOUNTER — Ambulatory Visit (INDEPENDENT_AMBULATORY_CARE_PROVIDER_SITE_OTHER): Payer: Medicare Other | Admitting: Family

## 2018-11-08 ENCOUNTER — Encounter: Payer: Self-pay | Admitting: Family

## 2018-11-08 VITALS — BP 140/72 | HR 80 | Temp 98.7°F | Ht 59.0 in | Wt 110.1 lb

## 2018-11-08 DIAGNOSIS — G8929 Other chronic pain: Secondary | ICD-10-CM

## 2018-11-08 MED ORDER — LORAZEPAM 0.5 MG PO TABS: 0.5000 mg | ORAL_TABLET | Freq: Three times a day (TID) | ORAL | 0 refills | Status: DC | PRN

## 2018-11-08 NOTE — Progress Notes (Signed)
Claudia [name] is a 85 y.o. female with the following history as recorded in [employer]:  Patient Active Problem List   Diagnosis Date Noted  . Dysuria 09/23/2018  . Hyperlipidemia 12/20/2017  . Routine general medical examination at a health care facility 12/19/2016  . Insomnia 06/19/2015  . Chronic narcotic use 12/13/2013  . Chronic back pain 12/13/2013  . Hypertension     Current Outpatient Medications  Medication Sig Dispense Refill  . cloNIDine (CATAPRES) 0.1 MG tablet TAKE 1 TABLET DAILY FOR BLOOD PRESSURE>180 OR >100. 30 tablet 5  . diltiazem (CARDIZEM CD) 120 MG 24 hr capsule Take 2 capsules (240 mg total) by mouth daily. 180 capsule 1  . docusate sodium 100 MG CAPS Take 100 mg by mouth daily. 10 capsule 0  . fentaNYL (DURAGESIC - DOSED MCG/HR) 100 MCG/HR Place 100 mcg onto the skin every other day.     Marland Kitchen LORazepam (ATIVAN) 0.5 MG tablet Take 1 tablet (0.5 mg total) by mouth every 8 (eight) hours as needed. 20 tablet 0  . nitroGLYCERIN (NITROSTAT) 0.4 MG SL tablet Place 0.4 mg under the tongue every 5 (five) minutes as needed for chest pain.    Marland Kitchen ondansetron (ZOFRAN ODT) 4 MG disintegrating tablet Take 1 tablet (4 mg total) by mouth every 8 (eight) hours as needed for nausea or vomiting. 20 tablet 0  . pantoprazole (PROTONIX) 40 MG tablet Take 1 tablet (40 mg total) by mouth daily. 90 tablet 1  . polyethylene glycol powder (GLYCOLAX/MIRALAX) powder TAKE 17 GRAMS TWICE A DAY AS NEEDED 527 g 1  . pregabalin (LYRICA) 75 MG capsule     . simvastatin (ZOCOR) 20 MG tablet TAKE 1 TABLET IN THE P.M. 90 tablet 1  . triamterene-hydrochlorothiazide (MAXZIDE-25) 37.5-25 MG tablet TAKE 1 TABLET EACH DAY. 90 tablet 1   No current facility-administered medications for this visit.     Allergies: Amitiza [lubiprostone]; Benadryl [diphenhydramine]; Ultram [tramadol]; Aspirin; Ibuprofen; and Sulfa antibiotics  Past Medical History:  Diagnosis Date  . Back pain   . Cataracts, both eyes   . CHF  (congestive heart failure) (HCC)   . Chronic narcotic use   . Chronic pain   . Coronary artery disease   . Hyperlipidemia   . Hypertension   . Mitral valve prolapse   . Osteoarthritis   . Osteoporosis   . Pain management   . Reflux     Past Surgical History:  Procedure Laterality Date  . ABDOMINAL HYSTERECTOMY    . APPENDECTOMY    . BLADDER SURGERY    . BREAST SURGERY    . SHOULDER SURGERY     Right    Family History  Problem Relation Age of Onset  . Hypertension Mother   . Diabetes Mother   . Heart disease Father   . Hypertension Father   . Diabetes Father     Social History   Tobacco Use  . Smoking status: Never Smoker  . Smokeless tobacco: Never Used  Substance Use Topics  . Alcohol use: No    Alcohol/week: 0.0 standard drinks    Subjective:  Patient fell on October 25, 2018- feel at home while trying to get into the bed; is under the care of pain management- takes Fentanyl; notes that she is just in chronic pain today- "is just ready to go." Asking for referral to hospice/ palliative care today; notes that she does not sleep well due to the pain; does have prescription for Ativan on her medication list  but notes she does not have a current prescription. States that she has thought about dying to be free from pain but would not actively hurt herself;      Objective:  Vitals:   11/08/18 1454  BP: 140/72  Pulse: 80  Temp: 98.7 F (37.1 C)  TempSrc: Oral  SpO2: 99%  Weight: 110 lb 1.3 oz (49.9 kg)  Height: 4\' 11"  (1.499 m)    General: Well developed, well nourished, in no acute distress; tearful/ frail appearing;  Skin : Warm and dry.  Head: Normocephalic and atraumatic  Eyes: Sclera and conjunctiva clear; pupils round and reactive to light; extraocular movements intact  Ears: External normal; canals clear; tympanic membranes normal  Oropharynx: Pink, supple. No suspicious lesions  Neck: Supple without thyromegaly, adenopathy  Lungs: Respirations  unlabored;  Neurologic: Alert and oriented; speech intact; face symmetrical; moves all extremities well; CNII-XII intact without focal deficit  Limited exam as majority of visit is spent in counseling  Assessment:  1. Other chronic pain     Plan:  Patient admits to have thoughts of dying but contracts that she will not harm herself; she is in agreement for referral to palliative care; she will also contact her pain management provider; agree to give Ativan refill- she contracts to take medication as prescribed/ contracts not to abuse or overdose on the medication; ER evaluation discussed but she defers at this time- notes she wants to talk to someone from palliative care agency; I also discussed this plan with patient's daughter who is in agreement about need for referral;  Spent 30 minutes with patient; greater than 50% spent in counseling;    No follow-ups on file.  Orders Placed This Encounter  Procedures  . Amb Referral to Palliative Care    Referral Priority:   Routine    Referral Type:   Consultation    Number of Visits Requested:   1    Requested Prescriptions   Signed Prescriptions Disp Refills  . LORazepam (ATIVAN) 0.5 MG tablet 20 tablet 0    Sig: Take 1 tablet (0.5 mg total) by mouth every 8 (eight) hours as needed.

## 2018-11-11 ENCOUNTER — Telehealth: Payer: Self-pay

## 2018-11-11 NOTE — Telephone Encounter (Signed)
Referral sent today for Palliative Care request

## 2018-11-14 ENCOUNTER — Telehealth: Payer: Self-pay | Admitting: Licensed Clinical Social Worker

## 2018-11-14 NOTE — Telephone Encounter (Signed)
Palliative Care SW spoke with patient's daughter, [name], and scheduled a home visit for Monday, 2/10, at 1pm.

## 2018-11-18 ENCOUNTER — Other Ambulatory Visit: Payer: Medicare Other | Admitting: *Deleted

## 2018-11-18 ENCOUNTER — Other Ambulatory Visit: Payer: Medicare Other | Admitting: Licensed Clinical Social Worker

## 2018-11-18 DIAGNOSIS — Z515 Encounter for palliative care: Secondary | ICD-10-CM

## 2018-11-18 NOTE — Progress Notes (Signed)
COMMUNITY PALLIATIVE CARE SW NOTE  PATIENT NAME: Claudia [name] DOB: March 13, 1931 MRN: [id number]  PRIMARY CARE PROVIDER: Hoyt Koch, MD  RESPONSIBLE PARTY:  Acct ID - Guarantor Home Phone Work Phone Relationship Acct Type  [id number] - Claudia [name],Claudia [name]  Self P/F     [address] , [place], [place] [address]     PLAN OF CARE and INTERVENTIONS:             1. GOALS OF CARE/ ADVANCE CARE PLANNING:  Patient wishes to remain in her home. She is a full code. 2. SOCIAL/EMOTIONAL/SPIRITUAL ASSESSMENT/ INTERVENTIONS:  SW and Palliative Care RN, Daryl Eastern, met with patient in her home, along with her daughter, [name].  Patient reports increased pain and does not feel her current pain medication is providing desired relief.  Patient has a MD at a pain management clinic.  RN to consult Palliative Care NP.  SW provided active listening and supportive counseling.  Patient was a Quarry manager at [employer].  She has a son in [place], [place] and [place].  [name] is a retired Marine scientist and lives near patient.  Patient also adopted her special needs granddaughter, Hebert Soho, when she was [age] years old.  [name] is now 41 and lives with patient.  [name] cannot talk, but will make sounds.  She is also incontinent and requires assistance bathing.  She did take direction from patient. 3. PATIENT/CAREGIVER EDUCATION/ COPING:  Patient copes by expressing herself openly.  Provided education regarding Palliative Care and she stated she understood. 4. PERSONAL EMERGENCY PLAN:  Patient will go to bed if her pain becomes too great.  Her family will contact EMS if needed. 5. COMMUNITY RESOURCES COORDINATION/ HEALTH CARE NAVIGATION:  None 6. FINANCIAL/LEGAL CONCERNS/INTERVENTIONS:  None per family.     SOCIAL HX:  Social History   Tobacco Use  . Smoking status: Never Smoker  . Smokeless tobacco: Never Used  Substance Use Topics  . Alcohol use: No    Alcohol/week: 0.0 standard drinks    CODE STATUS:  Full code  ADVANCED  DIRECTIVES: N MOST FORM COMPLETE:  N HOSPICE EDUCATION PROVIDED:  Family is familiar with Hospice.  Patient has worked at [employer] previously. PPS:  Patient reports her appetite decreases as her pain increases.  She is able to stand and use a walker. Duration of visit and documentation:  90 minutes.      Creola Corn Yulieth Carrender, LCSW

## 2018-11-20 NOTE — Progress Notes (Signed)
COMMUNITY PALLIATIVE CARE RN NOTE  PATIENT NAME: Claudia [name] DOB: 1931/07/05 MRN: [id number]  PRIMARY CARE PROVIDER: Hoyt Koch, MD  RESPONSIBLE PARTY:  Acct ID - Guarantor Home Phone Work Phone Relationship Acct Type  [id number] - [place]409 [phone]  Self P/F     [address] , [place], [place] [address]    PLAN OF CARE and INTERVENTION:  1. ADVANCE CARE PLANNING/GOALS OF CARE: She wants to remain in her home 2. PATIENT/CAREGIVER EDUCATION: Explained Palliative Care Services and Pain Management 3. DISEASE STATUS: Met with patient and daughter, Simonne Maffucci [name]) in patient's home. Patient sitting up on the couch and is awake and alert. She reports severe pain in her lower back on a daily basis. It often causes patient to become tearful. This is a chronic issue that started about 14 years ago, but is worsening. She denies any actual event leading to back pain such as fall or injury. She is currently on a Fentanyl patch 100 mcg every 2 days. She also wears a back brace, and without this brace she says she would not be able to walk. She currently sees Dr. Maryjean Ka with [facility]. Patient has tried several pain medications in the past and states the only medication that seemed to help is Morphine. She recently had a Cortisone injection in her lower back, which she says was ineffective. She has difficulty riding in cars due to pain. By the time she arrives at designated location, she can barely make it out of the car. She has a walker that puts her in a more upright position which helps some, but she still has to bend slightly to utilize it. Daughter reports that her recent MRI showed significant degenerative disk disease. Patient also has spinal stenosis. She reports difficulty sleeping for the past few days d/t pain, however she is now taking Ativan at bedtime and it helps her sleep. She sometimes goes back to bed around 2pm, because it is more comfortable for her to lie down. She  has had Physical Therapy in the past, but seemed to make pain worse. Her intake is poor, but she tries to eat something at least twice daily. Daughter brings food over to make sure she eats. She denies any recent episodes of nausea/vomiting over the past 2 weeks and has not had to utilize her Zofran. Recommended Boost or Ensure for nutritional supplementation. Will reach out to Palliative Care NP for pain management suggestions. Patient feels as if she is suffering. Will continue to monitor.   HISTORY OF PRESENT ILLNESS: This is a 85 yo female who resides at home. She was seen in the ED on 10/25/18 s/p fall with c/o chronic right sided lower back pain. She was treated and released back to her home. Palliative Care Team asked to follow patient to assist with symptom management needs.    CODE STATUS: Full Code  ADVANCED DIRECTIVES: N MOST FORM: no PPS: 50%   PHYSICAL EXAM:   VITALS: Today's Vitals   11/18/18 1333  BP: 122/62  Pulse: 74  Resp: 18  Temp: (!) 97.3 F (36.3 C)  TempSrc: Temporal  SpO2: 98%  PainSc: 8   PainLoc: Back    LUNGS: clear to auscultation  CARDIAC: Cor RRR EXTREMITIES: No edema SKIN: Exposed skin is dry and intact  NEURO: Alert and oriented x 3, generalized weakness, ambulatory with walker   (Duration of visit and documentation 90 minutes)    Daryl Eastern, RN, BSN

## 2018-11-21 ENCOUNTER — Telehealth: Payer: Self-pay | Admitting: *Deleted

## 2018-11-21 NOTE — Telephone Encounter (Signed)
Contacted and left a voicemail for CMA of Dr. Odette Fraction ([facility] MD), as requested by Palliative Care NP Margaretha Sheffield. Provided visit update to advise of patient's continued severe lower back pain despite current pain regimen and to inform of Palliative NP's recommendations for pain management. Left return contact information on voicemail.

## 2018-12-09 ENCOUNTER — Telehealth: Payer: Self-pay | Admitting: Internal Medicine

## 2018-12-09 NOTE — Telephone Encounter (Signed)
Phone call to office of pain management provider Dr. Odette Fraction and Redgie Grayer, PA in reference to poor management of pt's chronic back pain.  Reports from Candiss Norse, RN of [facility] are that pt. Did not tolerate most recent recommendation of use of Amitryptiline related to severe headaches.  She continue to have back pain that is rated as a 10/10 even with use of Fentanyl 100mg  patches.  She is able to sleep with use of Ativan 0.5mg  tabs(normally ordered for q 8hrs prn).   RN reports improvement of back pain in the past with use of Morphine.(noted in past medication history).  I spoke to nurse Dondra Prader in reference to this info.  Pt would have to be seen in the office prior to any additional medication adjustments which is understood.  Her monthly appt was changed from 01/02/19 @ 1045 to 12/17/18 at 1145.  Pt's daughter will be notified of above information and encouraged to call Dr. Clovis Pu office for confirmation.  M. Dimas Aguas, RN notified of plan as well.              Claudia Sheffield, NP-C

## 2018-12-16 ENCOUNTER — Encounter: Payer: Self-pay | Admitting: Internal Medicine

## 2018-12-16 ENCOUNTER — Ambulatory Visit (INDEPENDENT_AMBULATORY_CARE_PROVIDER_SITE_OTHER): Payer: Medicare Other | Admitting: Internal Medicine

## 2018-12-16 VITALS — BP 110/56 | HR 79 | Temp 98.1°F | Ht 59.0 in | Wt 107.0 lb

## 2018-12-16 DIAGNOSIS — F1190 Opioid use, unspecified, uncomplicated: Secondary | ICD-10-CM

## 2018-12-16 DIAGNOSIS — R300 Dysuria: Secondary | ICD-10-CM

## 2018-12-16 DIAGNOSIS — R399 Unspecified symptoms and signs involving the genitourinary system: Secondary | ICD-10-CM

## 2018-12-16 DIAGNOSIS — Z23 Encounter for immunization: Secondary | ICD-10-CM

## 2018-12-16 DIAGNOSIS — F5101 Primary insomnia: Secondary | ICD-10-CM

## 2018-12-16 LAB — POCT URINALYSIS DIPSTICK
Bilirubin, UA: NEGATIVE
Blood, UA: NEGATIVE
Glucose, UA: NEGATIVE
Ketones, UA: NEGATIVE
Nitrite, UA: NEGATIVE
Protein, UA: POSITIVE — AB
Spec Grav, UA: 1.015 (ref 1.010–1.025)
Urobilinogen, UA: 0.2 E.U./dL
pH, UA: 6 (ref 5.0–8.0)

## 2018-12-16 MED ORDER — NITROFURANTOIN MONOHYD MACRO 100 MG PO CAPS: 100.0000 mg | ORAL_CAPSULE | Freq: Two times a day (BID) | ORAL | 0 refills | Status: DC

## 2018-12-16 MED ORDER — TRAZODONE HCL 50 MG PO TABS: 50.0000 mg | ORAL_TABLET | Freq: Every evening | ORAL | 3 refills | Status: DC | PRN

## 2018-12-16 NOTE — Assessment & Plan Note (Signed)
Will change to trazodone from ativan given that her usage is increasing and she is on chronic opioids. She has not tried anything else for sleep previously.

## 2018-12-16 NOTE — Patient Instructions (Addendum)
We have sent in medicine for the urinary infection called macrobid to take 1 pill twice a day for 1 week.   We have sent in trazodone for sleep. Try 1 pill in the evening for sleep. If after 2-3 nights you cannot sleep increase to trying 2 pills in the evening. Give this a week and let us know if not working.

## 2018-12-16 NOTE — Assessment & Plan Note (Signed)
Given chronic fentanyl it is not safe to continue with ativan since usage seems to be increasing. Will avoid this and she continues to see her pain management specialist.

## 2018-12-16 NOTE — Progress Notes (Signed)
   Subjective:   Patient ID: Claudia [name], female    DOB: April 05, 1931, 85 y.o.   MRN: [id number]  HPI  The patient is a 85 y.o. female coming in for several concerns including urinary symptoms (started 2 days ago, main symptoms are: burning with urination some pressure in her abdomen prior to urination, denies fevers or chills or back pain change, overall it is stable since onset, has tried more fluids without success) and insomnia (has been taking rarely ativan over the years, got some from another provider about 1-2 months ago and has been taking it more regularly, having more pain which prevents her sleeping well, she denies side effects, has not tried anything else for pain).   Review of Systems  Constitutional: Negative.   HENT: Negative.   Eyes: Negative.   Respiratory: Negative.  Negative for cough, chest tightness and shortness of breath.   Cardiovascular: Negative.  Negative for chest pain, palpitations and leg swelling.  Gastrointestinal: Negative for abdominal distention, abdominal pain, constipation, diarrhea, nausea and vomiting.  Genitourinary: Positive for dysuria, frequency and urgency.  Musculoskeletal: Positive for arthralgias, back pain and myalgias.  Skin: Negative.   Neurological: Negative.   Psychiatric/Behavioral: Positive for sleep disturbance.    Objective:  Physical Exam Constitutional:      Appearance: She is well-developed.  HENT:     Head: Normocephalic and atraumatic.  Neck:     Musculoskeletal: Normal range of motion.  Cardiovascular:     Rate and Rhythm: Normal rate and regular rhythm.  Pulmonary:     Effort: Pulmonary effort is normal. No respiratory distress.     Breath sounds: Normal breath sounds. No wheezing or rales.  Abdominal:     General: Bowel sounds are normal. There is no distension.     Palpations: Abdomen is soft.     Tenderness: There is abdominal tenderness. There is no rebound.     Comments: Mild suprapubic tenderness  Skin:  General: Skin is warm and dry.  Neurological:     Mental Status: She is alert and oriented to person, place, and time.     Coordination: Coordination normal.     Vitals:   12/16/18 1309  BP: (!) 110/56  Pulse: 79  Temp: 98.1 F (36.7 C)  TempSrc: Oral  SpO2: 99%  Weight: 107 lb (48.5 kg)  Height: 4\' 11"  (1.499 m)    Assessment & Plan:  Flu shot given at visit

## 2018-12-16 NOTE — Assessment & Plan Note (Signed)
U/A checked in office which is consistent with UTI. Rx for macrobid. Advised to increase fluid intake.

## 2018-12-17 DIAGNOSIS — M545 Low back pain: Secondary | ICD-10-CM | POA: Diagnosis not present

## 2018-12-17 DIAGNOSIS — M48061 Spinal stenosis, lumbar region without neurogenic claudication: Secondary | ICD-10-CM | POA: Diagnosis not present

## 2018-12-18 DIAGNOSIS — M48061 Spinal stenosis, lumbar region without neurogenic claudication: Secondary | ICD-10-CM | POA: Diagnosis not present

## 2018-12-18 DIAGNOSIS — M545 Low back pain: Secondary | ICD-10-CM | POA: Diagnosis not present

## 2018-12-19 ENCOUNTER — Telehealth: Payer: Self-pay | Admitting: *Deleted

## 2018-12-19 NOTE — Telephone Encounter (Signed)
Contacted and spoke with patient to arrange a home visit. Visit scheduled for 12/23/18 at 12:30p.

## 2018-12-23 ENCOUNTER — Other Ambulatory Visit: Payer: Self-pay

## 2018-12-23 ENCOUNTER — Other Ambulatory Visit: Payer: Medicare Other | Admitting: *Deleted

## 2018-12-23 ENCOUNTER — Other Ambulatory Visit: Payer: Medicare Other | Admitting: Licensed Clinical Social Worker

## 2018-12-23 DIAGNOSIS — Z515 Encounter for palliative care: Secondary | ICD-10-CM

## 2018-12-23 NOTE — Progress Notes (Signed)
COMMUNITY PALLIATIVE CARE SW NOTE  PATIENT NAME: CARDELIA SASSANO DOB: 02/14/1931 MRN: [id number]  PRIMARY CARE PROVIDER: Hoyt Koch, MD  RESPONSIBLE PARTY:  Acct ID - Guarantor Home Phone Work Phone Relationship Acct Type  [id number] - [phone]  Self P/F     [address] , [place], [place] [address]     PLAN OF CARE and INTERVENTIONS:             1. GOALS OF CARE/ ADVANCE CARE PLANNING:  Patient's goal is to remain at home with her granddaughter, [name].  Patient is now a DNR. 2. SOCIAL/EMOTIONAL/SPIRITUAL ASSESSMENT/ INTERVENTIONS:  SW and Palliative Care RN, Daryl Eastern, met with patient, her daughter, [name], and patient's granddaughter, Hebert Soho, who is disabled.  Patient complained of medication adjustments not providing relief of her symptoms.  SW provided active listening and supportive counseling.  Discussed the care of [place] after patient's death.  [name] stated [name] could reside with her.  The issue of guardianship was raised.  SW provided resources for attorney assistance and recommended patient contact Dept. Of Social Services for guidance also.  Patient stated she has not obtained Medicaid for [place] patient.  SW to assist with paperwork during the next visit in two weeks.  Provided education regarding the MOST form.  RN to review with NP. 3. PATIENT/CAREGIVER EDUCATION/ COPING:  SW and RN provided education regarding advance directives.  She stated she understood.  Patient expresses her feelings openly. 4. PERSONAL EMERGENCY PLAN:  Patient will rest when her pain becomes worse.  Her family contact EMS as needed. 5. COMMUNITY RESOURCES COORDINATION/ HEALTH CARE NAVIGATION:  None. 6. FINANCIAL/LEGAL CONCERNS/INTERVENTIONS:  Patient is on a fixed income.     SOCIAL HX:  Social History   Tobacco Use  . Smoking status: Never Smoker  . Smokeless tobacco: Never Used  Substance Use Topics  . Alcohol use: No    Alcohol/week: 0.0 standard drinks    CODE STATUS:  DNR   ADVANCED DIRECTIVES: N MOST FORM COMPLETE:  Provided a copy for patient to review HOSPICE EDUCATION PROVIDED: Patient's husband was under Hospice in [place]. PPS:  Patient's reports her appetite is normal.  She uses a walker to ambulate. Duration of visit and documentation:  60 minutes.      Creola Corn Betzabeth Derringer, LCSW

## 2018-12-23 NOTE — Progress Notes (Signed)
COMMUNITY PALLIATIVE CARE RN NOTE  PATIENT NAME: Claudia [name] DOB: June 24, 1931 MRN: [id number]  PRIMARY CARE PROVIDER: Hoyt Koch, MD  RESPONSIBLE PARTY:  Acct ID - Guarantor Home Phone Work Phone Relationship Acct Type  [id number] - [phone]  Self P/F     [address] , [place], [place] [address]    PLAN OF CARE and INTERVENTION:  1. ADVANCE CARE PLANNING/GOALS OF CARE: Goal is for patient to remain at home. She is a DNR. 2. PATIENT/CAREGIVER EDUCATION: Reinforced Safe Mobility and Pain Management 3. DISEASE STATUS: Joint visit made with Palliative Care SW, Lynn Duffy. Met with patient and her daughter Claudia [name] in patient's home. Patient is sitting up on her couch and is awake and alert. Patient reports continued severe lower back pain, especially with movement and activity. She was recently seen by Pain Management who started patient back on Lyrica 75 mg every 8 hours PRN. Patient feels that this has not been effective. She says that she has been taking it regularly. She has been taking this medication for 5 days. She had a MRI done last week. She has another follow up appointment with Pain Management, Dr. Maryjean Ka, at [facility] on 01/15/19. She remains able to ambulate and dress herself without assistance, but this task is becoming more difficult d/t pain. She continues to be caregiver for her 83 yo adoptive daughter, Hebert Soho, who has severe mental disability and is total care. Her daughter Claudia [name] helps care for [place]. Meals are brought to patient by son and daughter on a daily basis. Patient states that she is no longer able to cook, but is able to heat food in her oven. Reviewed MOST form with patient and will take this form to NP to obtain signature. Signed DNR form left in the home as patient wants to be a DNR. Her intake is fair. Denies nausea today. Will continue to monitor.  HISTORY OF PRESENT ILLNESS:  This is a 85 yo female who lives at home alone  and is caretaker for an adoptive daughter with mental disability. Palliative Care team continues to follow patient. Next visit scheduled in 2 weeks.  CODE STATUS: DNR  ADVANCED DIRECTIVES: Y MOST FORM: form filled out and will take to NP for signature PPS: 50%   PHYSICAL EXAM:    LUNGS: No dyspnea CARDIAC: Cor RRR EXTREMITIES: No edema SKIN: Exposed skin is dry and intact  NEURO: Alert and oriented x 3, HOH, generalized weakness, ambulates with walker   (Duration of visit and documentation 75 minutes)    Daryl Eastern, RN BSN

## 2019-01-06 ENCOUNTER — Telehealth: Payer: Self-pay | Admitting: *Deleted

## 2019-01-06 ENCOUNTER — Other Ambulatory Visit: Payer: Self-pay

## 2019-01-06 ENCOUNTER — Telehealth: Payer: Medicare Other | Admitting: Licensed Clinical Social Worker

## 2019-01-06 DIAGNOSIS — Z515 Encounter for palliative care: Secondary | ICD-10-CM | POA: Diagnosis not present

## 2019-01-06 NOTE — Progress Notes (Deleted)
COMMUNITY PALLIATIVE CARE SW NOTE  PATIENT NAME: TEENA LORDEN DOB: 1931-05-26 MRN: [id number]  PRIMARY CARE PROVIDER: [name], MD  RESPONSIBLE PARTY:  Acct ID - Guarantor Home Phone Work Phone Relationship Acct Type  [id number] - [place]* [phone]  Self P/F     [address] , [place], [place] [address]     PLAN OF CARE and INTERVENTIONS:             1. GOALS OF CARE/ ADVANCE CARE PLANNING:  *** 2. SOCIAL/EMOTIONAL/SPIRITUAL ASSESSMENT/ INTERVENTIONS:  *** 3. PATIENT/CAREGIVER EDUCATION/ COPING:  *** 4. PERSONAL EMERGENCY PLAN:  *** 5. COMMUNITY RESOURCES COORDINATION/ HEALTH CARE NAVIGATION:  *** 6. FINANCIAL/LEGAL CONCERNS/INTERVENTIONS:  ***     SOCIAL HX:  Social History   Tobacco Use  . Smoking status: Never Smoker  . Smokeless tobacco: Never Used  Substance Use Topics  . Alcohol use: No    Alcohol/week: 0.0 standard drinks    CODE STATUS:   Code Status: Prior  ADVANCED DIRECTIVES: N MOST FORM COMPLETE:  {Responses; yes/no} HOSPICE EDUCATION PROVIDED: ***  PPS:***      Chipper Herb, LCSWCOMMUNITY PALLIATIVE CARE SW NOTE  PATIENT NAME: CATERIN PLACIDE DOB: 01-30-1931 MRN: [id number]  PRIMARY CARE PROVIDER: [name], MD  RESPONSIBLE PARTY:  Acct ID - Guarantor Home Phone Work Phone Relationship Acct Type  [id number] - [place]* [phone]  Self P/F     [address] , [place], [place] [address]     PLAN OF CARE and INTERVENTIONS:             7. GOALS OF CARE/ ADVANCE CARE PLANNING:  *** 8. SOCIAL/EMOTIONAL/SPIRITUAL ASSESSMENT/ INTERVENTIONS:  *** 9. PATIENT/CAREGIVER EDUCATION/ COPING:  *** 10. PERSONAL EMERGENCY PLAN:  *** 11. COMMUNITY RESOURCES COORDINATION/ HEALTH CARE NAVIGATION:  *** 12. FINANCIAL/LEGAL CONCERNS/INTERVENTIONS:  ***     SOCIAL HX:  Social History   Tobacco Use  . Smoking status: Never Smoker  . Smokeless tobacco: Never Used  Substance Use Topics  . Alcohol use: No    Alcohol/week: 0.0 standard  drinks    CODE STATUS:   Code Status: Prior  ADVANCED DIRECTIVES: N MOST FORM COMPLETE:  {Responses; yes/no} HOSPICE EDUCATION PROVIDED: ***  PPS:***      Vella Kohler Shatonya Passon, LCSW

## 2019-01-06 NOTE — Telephone Encounter (Signed)
Called patient to inform them the nurse needs to either convert their upcoming AWV to a virtual visit or reschedule the visit out into the future due to covid-19 safety measures. Patient does not have a device for virtual visit, rescheduled for 04/02/19.

## 2019-01-06 NOTE — Progress Notes (Deleted)
COMMUNITY PALLIATIVE CARE SW NOTE  PATIENT NAME: Claudia [name] DOB: [birth year] MRN: [id number]  PRIMARY CARE PROVIDER: [name], MD  RESPONSIBLE PARTY:  Acct ID - Guarantor Home Phone Work Phone Relationship Acct Type  [id number] - [place]* [phone]  Self P/F     [address] , [place], [place] [address]     PLAN OF CARE and INTERVENTIONS:             1. GOALS OF CARE/ ADVANCE CARE PLANNING:  *** 2. SOCIAL/EMOTIONAL/SPIRITUAL ASSESSMENT/ INTERVENTIONS:  *** 3. PATIENT/CAREGIVER EDUCATION/ COPING:  *** 4. PERSONAL EMERGENCY PLAN:  *** 5. COMMUNITY RESOURCES COORDINATION/ HEALTH CARE NAVIGATION:  *** 6. FINANCIAL/LEGAL CONCERNS/INTERVENTIONS:  ***     SOCIAL HX:  Social History   Tobacco Use  . Smoking status: Never Smoker  . Smokeless tobacco: Never Used  Substance Use Topics  . Alcohol use: No    Alcohol/week: 0.0 standard drinks    CODE STATUS:   Code Status: Prior  ADVANCED DIRECTIVES: N MOST FORM COMPLETE:  {Responses; yes/no} HOSPICE EDUCATION PROVIDED: ***  PPS:***      Chipper Herb, LCSWCOMMUNITY PALLIATIVE CARE SW NOTE  PATIENT NAME: Claudia [name] DOB: [birth year] MRN: [id number]  PRIMARY CARE PROVIDER: [name], MD  RESPONSIBLE PARTY:  Acct ID - Guarantor Home Phone Work Phone Relationship Acct Type  [id number] - [place]* [phone]  Self P/F     [address] , [place], [place] [address]     PLAN OF CARE and INTERVENTIONS:             7. GOALS OF CARE/ ADVANCE CARE PLANNING:  *** 8. SOCIAL/EMOTIONAL/SPIRITUAL ASSESSMENT/ INTERVENTIONS:  *** 9. PATIENT/CAREGIVER EDUCATION/ COPING:  *** 10. PERSONAL EMERGENCY PLAN:  *** 11. COMMUNITY RESOURCES COORDINATION/ HEALTH CARE NAVIGATION:  *** 12. FINANCIAL/LEGAL CONCERNS/INTERVENTIONS:  ***     SOCIAL HX:  Social History   Tobacco Use  . Smoking status: Never Smoker  . Smokeless tobacco: Never Used  Substance Use Topics  . Alcohol use: No    Alcohol/week: 0.0 standard  drinks    CODE STATUS:   Code Status: Prior  ADVANCED DIRECTIVES: N MOST FORM COMPLETE:  {Responses; yes/no} HOSPICE EDUCATION PROVIDED: ***  PPS:***      Chipper Herb, LCSW  COMMUNITY PALLIATIVE CARE SW NOTE  PATIENT NAME: Claudia [name] DOB: Sep 23, 1931 MRN: [id number]  PRIMARY CARE PROVIDER: [name], MD  RESPONSIBLE PARTY:  Acct ID - Guarantor Home Phone Work Phone Relationship Acct Type  [id number] - [place]* [phone]  Self P/F     [address] , [place], [place] [address]     PLAN OF CARE and INTERVENTIONS:             13. GOALS OF CARE/ ADVANCE CARE PLANNING:  *** 14. SOCIAL/EMOTIONAL/SPIRITUAL ASSESSMENT/ INTERVENTIONS:  *** 15. PATIENT/CAREGIVER EDUCATION/ COPING:  *** 16. PERSONAL EMERGENCY PLAN:  *** 17. COMMUNITY RESOURCES COORDINATION/ HEALTH CARE NAVIGATION:  *** 18. FINANCIAL/LEGAL CONCERNS/INTERVENTIONS:  ***     SOCIAL HX:  Social History   Tobacco Use  . Smoking status: Never Smoker  . Smokeless tobacco: Never Used  Substance Use Topics  . Alcohol use: No    Alcohol/week: 0.0 standard drinks    CODE STATUS:   Code Status: Prior  ADVANCED DIRECTIVES: N MOST FORM COMPLETE:  {Responses; yes/no} HOSPICE EDUCATION PROVIDED: ***  PPS:***      Chipper Herb, LCSWCOMMUNITY PALLIATIVE CARE SW NOTE  PATIENT NAME: Claudia [name] DOB: Jan 18, 1931 MRN: [id number]  PRIMARY CARE PROVIDER: [name], MD  RESPONSIBLE PARTY:  Acct ID - Guarantor Home Phone Work Phone Relationship Acct Type  [id number] - [place]* [phone]  Self P/F     [address] , [place], [place] [address]     PLAN OF CARE and INTERVENTIONS:             19. GOALS OF CARE/ ADVANCE CARE PLANNING:  *** 20. SOCIAL/EMOTIONAL/SPIRITUAL ASSESSMENT/ INTERVENTIONS:  *** 21. PATIENT/CAREGIVER EDUCATION/ COPING:  *** 22. PERSONAL EMERGENCY PLAN:  *** 23. COMMUNITY RESOURCES COORDINATION/ HEALTH CARE NAVIGATION:  *** 24. FINANCIAL/LEGAL CONCERNS/INTERVENTIONS:   ***     SOCIAL HX:  Social History   Tobacco Use  . Smoking status: Never Smoker  . Smokeless tobacco: Never Used  Substance Use Topics  . Alcohol use: No    Alcohol/week: 0.0 standard drinks    CODE STATUS:   Code Status: Prior  ADVANCED DIRECTIVES: N MOST FORM COMPLETE:  {Responses; yes/no} HOSPICE EDUCATION PROVIDED: ***  PPS:***      Vella Kohler Carsen Leaf, LCSW

## 2019-01-06 NOTE — Progress Notes (Deleted)
COMMUNITY PALLIATIVE CARE SW NOTE  PATIENT NAME: Claudia [name] DOB: [birth year] MRN: [id number]  PRIMARY CARE PROVIDER: [name], MD  RESPONSIBLE PARTY:  Acct ID - Guarantor Home Phone Work Phone Relationship Acct Type  [id number] - [place]* [phone]  Self P/F     [address] , [place], [place] [address]     PLAN OF CARE and INTERVENTIONS:             1. GOALS OF CARE/ ADVANCE CARE PLANNING:  *** 2. SOCIAL/EMOTIONAL/SPIRITUAL ASSESSMENT/ INTERVENTIONS:  *** 3. PATIENT/CAREGIVER EDUCATION/ COPING:  *** 4. PERSONAL EMERGENCY PLAN:  *** 5. COMMUNITY RESOURCES COORDINATION/ HEALTH CARE NAVIGATION:  *** 6. FINANCIAL/LEGAL CONCERNS/INTERVENTIONS:  ***     SOCIAL HX:  Social History   Tobacco Use  . Smoking status: Never Smoker  . Smokeless tobacco: Never Used  Substance Use Topics  . Alcohol use: No    Alcohol/week: 0.0 standard drinks    CODE STATUS:   Code Status: Prior  ADVANCED DIRECTIVES: N MOST FORM COMPLETE:  {Responses; yes/no} HOSPICE EDUCATION PROVIDED: ***  PPS:***      Vella Kohler Fredrick Ge[place]gan, LCSW

## 2019-01-07 NOTE — Progress Notes (Signed)
Due to the COVID-19 crisis, this virtual check-in visit was done via telephone from my office and it was initiated and consent by the patient.  Patient's goal is to remain in her home with her granddaughter, Crist Infante, who has special needs.  Patient has a DNR.  SW provided education regarding virtual check-in visits and she stated she understood.   Patient complained of continued discomfort and has a call into her pain doctor.  Patient reports she has not other concerns.  Inquired about assistance with obtaining Medicaid for her granddaughter and patient declined at this time.  Patient is on a fixed income.  SW provided active listening and supportive counseling while patient participated in life review.  Vella Kohler. Eydan Chianese, LCSW

## 2019-01-08 ENCOUNTER — Ambulatory Visit: Payer: Medicare Other

## 2019-01-08 ENCOUNTER — Encounter: Payer: Medicare Other | Admitting: Internal Medicine

## 2019-01-09 ENCOUNTER — Other Ambulatory Visit: Payer: Self-pay

## 2019-01-15 DIAGNOSIS — M48061 Spinal stenosis, lumbar region without neurogenic claudication: Secondary | ICD-10-CM | POA: Diagnosis not present

## 2019-01-15 DIAGNOSIS — M545 Low back pain: Secondary | ICD-10-CM | POA: Diagnosis not present

## 2019-01-27 ENCOUNTER — Telehealth: Payer: Self-pay | Admitting: Internal Medicine

## 2019-01-27 MED ORDER — DILTIAZEM HCL ER COATED BEADS 120 MG PO CP24: 240.0000 mg | ORAL_CAPSULE | Freq: Every day | ORAL | 0 refills | Status: DC

## 2019-01-27 MED ORDER — SIMVASTATIN 20 MG PO TABS: ORAL_TABLET | ORAL | 0 refills | Status: DC

## 2019-01-27 MED ORDER — TRIAMTERENE-HCTZ 37.5-25 MG PO TABS: ORAL_TABLET | ORAL | 0 refills | Status: DC

## 2019-01-27 MED ORDER — PANTOPRAZOLE SODIUM 40 MG PO TBEC: 40.0000 mg | DELAYED_RELEASE_TABLET | Freq: Every day | ORAL | 0 refills | Status: DC
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
## 2019-01-27 NOTE — Telephone Encounter (Signed)
Medications resent to local pharmacy

## 2019-02-12 ENCOUNTER — Other Ambulatory Visit: Payer: Medicare Other | Admitting: Licensed Clinical Social Worker

## 2019-02-12 ENCOUNTER — Other Ambulatory Visit: Payer: Self-pay

## 2019-02-12 DIAGNOSIS — Z515 Encounter for palliative care: Secondary | ICD-10-CM

## 2019-02-12 NOTE — Progress Notes (Signed)
COMMUNITY PALLIATIVE CARE SW NOTE  PATIENT NAME: Claudia [name] DOB: 07/07/1931 MRN: [id number]  PRIMARY CARE PROVIDER: [name], MD  RESPONSIBLE PARTY:  Acct ID - Guarantor Home Phone Work Phone Relationship Acct Type  [id number] ALCIA, CUNDIFF* [phone]  Self P/F     [address] , [place], [place] [address]   Due to the COVID-19 crisis, this virtual check-in visit was done via telephone from my office and it was initiated and consent by the patient.  PLAN OF CARE and INTERVENTIONS:             1. GOALS OF CARE/ ADVANCE CARE PLANNING:  Goal is for patient to remain at home with her granddaughter, Crist Infante, who has special needs.  Patient is a DNR. 2. SOCIAL/EMOTIONAL/SPIRITUAL ASSESSMENT/ INTERVENTIONS:  SW conducted a virtual check-in visit with patient in her home.  She stated she is now on morphine for pain, but that it is not totally effective.  She has a telehealth visit tomorrow with her Pain MD.  SW encouraged her to report this to the MD.  SW provided active listening and supportive counseling while patient discussed her family and faith. 3. PATIENT/CAREGIVER EDUCATION/ COPING:  Patient copes by expressing her feelings openly. 4. PERSONAL EMERGENCY PLAN:  She will contact her daughter, [name], for assistance. 5. COMMUNITY RESOURCES COORDINATION/ HEALTH CARE NAVIGATION:  None. 6. FINANCIAL/LEGAL CONCERNS/INTERVENTIONS:  Patient is on a fixed income.     SOCIAL HX:  Social History   Tobacco Use  . Smoking status: Never Smoker  . Smokeless tobacco: Never Used  Substance Use Topics  . Alcohol use: No    Alcohol/week: 0.0 standard drinks    CODE STATUS:  DNR  ADVANCED DIRECTIVES: N MOST FORM COMPLETE:  N HOSPICE EDUCATION PROVIDED: N PPS:  Patient reports that her appetite is normal.  She ambulates with a walker. Duration of visit and documentation:  45 minutes.      Vella Kohler Chenee Munns, LCSW

## 2019-02-13 DIAGNOSIS — M48061 Spinal stenosis, lumbar region without neurogenic claudication: Secondary | ICD-10-CM | POA: Diagnosis not present

## 2019-02-13 DIAGNOSIS — M4316 Spondylolisthesis, lumbar region: Secondary | ICD-10-CM | POA: Diagnosis not present

## 2019-03-20 ENCOUNTER — Ambulatory Visit: Payer: Medicare Other

## 2019-03-20 ENCOUNTER — Encounter: Payer: Medicare Other | Admitting: Internal Medicine

## 2019-03-31 ENCOUNTER — Telehealth: Payer: Self-pay

## 2019-03-31 NOTE — Telephone Encounter (Signed)
LVM to call back to go over screening process and check in process for both patients appointments on Wednesday.

## 2019-03-31 NOTE — Telephone Encounter (Signed)
Copied from [place] [id number]. Topic: Appointment Scheduling - Scheduling Inquiry for Clinic >> Mar 28, 2019 12:00 PM Berneta Levins wrote: Reason for CRM:  Pt's daughter, Gordy Councilman calling to find out what they need to do about pt's appointments on Wednesday.  Pt is hard of hearing and needs instructions for visit (time, masks, how many folks, etc) to be given to Loop.  Cassell Smiles will be able to take call regarding this on Monday.  Please call her at [phone]. Tried office 3x.

## 2019-04-02 ENCOUNTER — Encounter: Payer: Self-pay | Admitting: Internal Medicine

## 2019-04-02 ENCOUNTER — Ambulatory Visit: Payer: Medicare Other | Admitting: Internal Medicine

## 2019-04-02 ENCOUNTER — Encounter: Payer: Self-pay | Admitting: *Deleted

## 2019-04-02 ENCOUNTER — Other Ambulatory Visit: Payer: Self-pay

## 2019-04-02 ENCOUNTER — Ambulatory Visit (INDEPENDENT_AMBULATORY_CARE_PROVIDER_SITE_OTHER): Payer: Medicare Other | Admitting: Internal Medicine

## 2019-04-02 ENCOUNTER — Ambulatory Visit (INDEPENDENT_AMBULATORY_CARE_PROVIDER_SITE_OTHER): Payer: Medicare Other | Admitting: *Deleted

## 2019-04-02 ENCOUNTER — Other Ambulatory Visit: Payer: Self-pay | Admitting: *Deleted

## 2019-04-02 VITALS — BP 128/60 | HR 81 | Ht 59.0 in | Wt 107.0 lb

## 2019-04-02 VITALS — BP 128/60 | HR 81 | Temp 98.8°F | Ht 59.0 in | Wt 107.0 lb

## 2019-04-02 DIAGNOSIS — I10 Essential (primary) hypertension: Secondary | ICD-10-CM

## 2019-04-02 DIAGNOSIS — E785 Hyperlipidemia, unspecified: Secondary | ICD-10-CM

## 2019-04-02 DIAGNOSIS — F5101 Primary insomnia: Secondary | ICD-10-CM | POA: Diagnosis not present

## 2019-04-02 DIAGNOSIS — G8929 Other chronic pain: Secondary | ICD-10-CM

## 2019-04-02 DIAGNOSIS — M549 Dorsalgia, unspecified: Secondary | ICD-10-CM | POA: Diagnosis not present

## 2019-04-02 DIAGNOSIS — Z0000 Encounter for general adult medical examination without abnormal findings: Secondary | ICD-10-CM

## 2019-04-02 MED ORDER — SIMVASTATIN 20 MG PO TABS: ORAL_TABLET | ORAL | 3 refills | Status: DC

## 2019-04-02 MED ORDER — TRAZODONE HCL 50 MG PO TABS: 50.0000 mg | ORAL_TABLET | Freq: Every evening | ORAL | 3 refills | Status: DC | PRN

## 2019-04-02 MED ORDER — TRIAMTERENE-HCTZ 37.5-25 MG PO TABS: ORAL_TABLET | ORAL | 3 refills | Status: DC

## 2019-04-02 MED ORDER — PANTOPRAZOLE SODIUM 40 MG PO TBEC: 40.0000 mg | DELAYED_RELEASE_TABLET | Freq: Every day | ORAL | 3 refills | Status: DC

## 2019-04-02 MED ORDER — DILTIAZEM HCL ER COATED BEADS 120 MG PO CP24: 240.0000 mg | ORAL_CAPSULE | Freq: Every day | ORAL | 3 refills | Status: DC

## 2019-04-02 NOTE — Patient Instructions (Addendum)
[facility] is available Monday through Friday, 9:00 a.m. - 5:00 p.m. Call Center Specialists provide information and referral services to aging adults 65+ in [place]. If there are waiting lists for community services, or if services are not available, [facility] connects clients with [facility] volunteers, or other caring individuals in the community who provide services such as respite care, wheelchair ramp construction, friendly visits, or transportation assistance.  Call Center Specialists consider it an honor and privilege to pray with callers. Contact the Call Center at [phone] or email [email or web address].  Housing, home repair, transportation, food, resources for caregivers, senior and disability centers, adult day care, long term care options. https://mendoza.info/ Can't find the help you need here? Dial 2-1-1 Or [phone], TTY # [phone] CONTACT  If you are looking for programs or services in your community, please dial 2-1-1 or use our online Search Tool.  Continue to eat heart healthy diet (full of fruits, vegetables, whole grains, lean protein, water--limit salt, fat, and sugar intake) and increase physical activity as tolerated.  [facility] in [place], [place] Address: [address], [place], [place] [address] Hours:  [place] Born soon ? 9AM Phone: [phone]  [employer] supplies, will ship to home [phone] and file insurance  Ms. Grunewald , Thank you for taking time to come for your Medicare Wellness Visit. I appreciate your ongoing commitment to your health goals. Please review the following plan we discussed and let me know if I can assist you in the future.   These are the goals we discussed: Goals      Patient Stated   . maintain health (pt-stated)     Will continue to maintain health; does not feel weight loss is a problem Can supplement your diet with boost or muscle milk; Would encourage eating  something at mealtime       This is a list of the screening recommended for you and due dates:  Health Maintenance  Topic Date Due  . Flu Shot  05/10/2019  . Tetanus Vaccine  11/13/2024  . DEXA scan (bone density measurement)  Completed  . Pneumonia vaccines  Completed    Preventive Care 25 Years and Older, Female Preventive care refers to lifestyle choices and visits with your health care provider that can promote health and wellness. What does preventive care include?  A yearly physical exam. This is also called an annual well check.  Dental exams once or twice a year.  Routine eye exams. Ask your health care provider how often you should have your eyes checked.  Personal lifestyle choices, including: ? Daily care of your teeth and gums. ? Regular physical activity. ? Eating a healthy diet. ? Avoiding tobacco and drug use. ? Limiting alcohol use. ? Practicing safe sex. ? Taking low-dose aspirin every day. ? Taking vitamin and mineral supplements as recommended by your health care provider. What happens during an annual well check? The services and screenings done by your health care provider during your annual well check will depend on your age, overall health, lifestyle risk factors, and family history of disease. Counseling Your health care provider may ask you questions about your:  Alcohol use.  Tobacco use.  Drug use.  Emotional well-being.  Home and relationship well-being.  Sexual activity.  Eating habits.  History of falls.  Memory and ability to understand (cognition).  Work and work Statistician.  Reproductive health.  Screening You may have the following tests or measurements:  Height, weight,  and BMI.  Blood pressure.  Lipid and cholesterol levels. These may be checked every 5 years, or more frequently if you are over 29 years old.  Skin check.  Lung cancer screening. You may have this screening every year starting at age 73 if you  have a 30-pack-year history of smoking and currently smoke or have quit within the past 15 years.  Colorectal cancer screening. All adults should have this screening starting at age 32 and continuing until age 66. You will have tests every 1-10 years, depending on your results and the type of screening test. People at increased risk should start screening at an earlier age. Screening tests may include: ? Guaiac-based fecal occult blood testing. ? Fecal immunochemical test (FIT). ? Stool DNA test. ? Virtual colonoscopy. ? Sigmoidoscopy. During this test, a flexible tube with a tiny camera (sigmoidoscope) is used to examine your rectum and lower colon. The sigmoidoscope is inserted through your anus into your rectum and lower colon. ? Colonoscopy. During this test, a long, thin, flexible tube with a tiny camera (colonoscope) is used to examine your entire colon and rectum.  Hepatitis C blood test.  Hepatitis B blood test.  Sexually transmitted disease (STD) testing.  Diabetes screening. This is done by checking your blood sugar (glucose) after you have not eaten for a while (fasting). You may have this done every 1-3 years.  Bone density scan. This is done to screen for osteoporosis. You may have this done starting at age 98.  Mammogram. This may be done every 1-2 years. Talk to your health care provider about how often you should have regular mammograms. Talk with your health care provider about your test results, treatment options, and if necessary, the need for more tests. Vaccines Your health care provider may recommend certain vaccines, such as:  Influenza vaccine. This is recommended every year.  Tetanus, diphtheria, and acellular pertussis (Tdap, Td) vaccine. You may need a Td booster every 10 years.  Varicella vaccine. You may need this if you have not been vaccinated.  Zoster vaccine. You may need this after age 59.  Measles, mumps, and rubella (MMR) vaccine. You may need at  least one dose of MMR if you were born in 1957 or later. You may also need a second dose.  Pneumococcal 13-valent conjugate (PCV13) vaccine. One dose is recommended after age 11.  Pneumococcal polysaccharide (PPSV23) vaccine. One dose is recommended after age 26.  Meningococcal vaccine. You may need this if you have certain conditions.  Hepatitis A vaccine. You may need this if you have certain conditions or if you travel or work in places where you may be exposed to hepatitis A.  Hepatitis B vaccine. You may need this if you have certain conditions or if you travel or work in places where you may be exposed to hepatitis B.  Haemophilus influenzae type b (Hib) vaccine. You may need this if you have certain conditions. Talk to your health care provider about which screenings and vaccines you need and how often you need them. This information is not intended to replace advice given to you by your health care provider. Make sure you discuss any questions you have with your health care provider. Document Released: 10/22/2015 Document Revised: 11/15/2017 Document Reviewed: 07/27/2015 Elsevier Interactive Patient Education  2019 [employer].

## 2019-04-02 NOTE — Progress Notes (Signed)
Medical screening examination/treatment/procedure(s) were performed by non-physician practitioner and as supervising physician I was immediately available for consultation/collaboration. I agree with above. Particia Strahm A Camden Mazzaferro, MD 

## 2019-04-02 NOTE — Patient Outreach (Signed)
[facility]) Care Management  04/02/2019  Ayianna Darnold Segovia 1931-04-16 [id number]   Telephone Screen  Referral Date: 04/02/19 Referral Source: MD referral Lesly Rubenstein, MD Mosetta Pigeon, RN [phone]  Referral Reason: Community resources, cares for special needs daughter and having difficult preparing meals and has financial issues. Little to none assistance  Insurance: medicare    Outreach attempt # 1 successful  the home number  Patient is able to verify HIPAA Reviewed and addressed referral to [facility] with patient  She confirms the MD referral information is correct related to need for meals, financial concerns and poor support system  She welcomes any type of assistance   She has tried to get assistance from Meals on Wheels previously without success Her MD office RN assisted her on 04/02/19 but giving her some Ensure and coupons to assist with her nutritional status   Social: Mrs Grumbine is a 85 year old retired, widow who lives with and is the primary caregiver and legal guardian for her disabled adopted daughter, Sharlyn Bologna. 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 She reports she stopped working to care for her husband. Mrs Oland reports being independent with ADLs and needing assist with iADLs and transportation. She moved to [place] in 02-02-05 (14 years ago) after the death of her husband in 02-03-04 ("Al"/Jack from colon cancer with mets). She did live briefly in [place] before coming to [facility]. She has 2 biological sons both living out of state ([place]izer, in [place] in [place])  and a biological daughter, [name], who lives in [place] but across town. [name] her daughter assists on occasions with errands, grocery shopping and transportation to medical appointments. [name] assisted her to her primary MD appointment on 04/02/19 but would not take her w/c only her cane She has a brother who lives in [place] and a sister in [place]  She  considers her health as poor as she reports she is getting older, no longer able to do yard work, no longer drives and is not able to care for herself nor Sharlyn Bologna as she previously was able to do. She still has her driver's license but reports giving her car to her grandson. She has never used [employer] services and was not aware of them. She had a neighbor that previously assisted her and Sharlyn Bologna but that assist is no longer available related to a disagreement. A neighbor, Hassan Rowan checks on her on occasions  She reports receiving $785/month that she places in her local credit union. She reports owning her present home and still having a home and land in [place] that belonged to her husband She reports home improvements are needed in her bathroom to assist her and Sharlyn Bologna with safely being able to access their tub/shower. In the past she attempted to have a gentleman assist her with replacing her tub unsuccessfully.   Conditions: HTN, chronic narcotic use, chronic back pain, insomnia, HLD, GERD, never smoker, constipation, fall hx   Fall: Fall in kitchen and "slid across the floor" this week when attempting to prepare microwave frozen meal hit head, injury to right side of face (bruise, laceration on eyebrow), gait problem She denies tripping on an object or feeling dizzy prior to the fall She reports just feeling weak. She reports applying a cool cloth to her face.  She reports a fall in the winter while walking in the snow She hit her head  at that time also   ZOX:WRUEAME:light weight w/c, cane  Medications: taking MS contin q 8 hr vs q 12 as ordered for pain management,  Dr Juanetta GoslingHawkins manages her pain medicines She was wearing a pain patch but it is now off She reports she is still in pain and the pain patch nor the MS contin has ever completely relieved the pain but she tolerates it  Cost is only $3.60 a bottle She denies cost concerns, side effect and having questions about her medicines  [facility] Flu shot on 12/16/18 11 pills CM discussed a lower cost of Miralax/Clearlax at [facility] vs [facility]  Appointments: seen by Dr Okey Duprerawford on 04/02/19 no pending appointment   Advance Directives: Has a living will and POA but asked questions about possible changes  Consent: [facility] RN CM [facility] with patient. Patient gave verbal consent for services [facility]onic RN CM and [facility] .   [facility] CM will refer [facility] Bracknell [facility] for community resources per MD referral reason that states patient cares for special needs daughter and having difficult preparing meals and has financial issues. Little to none assistance  Mrs Earlean PolkaClowe is requesting assistance with resources for home improvements in her bathroom, meals on wheels, financial assistance, possible legal services related to advance directives and clarification of her legal guardianship forms, DME (Ensure, Boost or other nutritional supplemental supplies) and alternate transportation resources.  [facility] RN CM will close case at this time  Pt encouraged to return a call to [facility] RN CM prn  [facility] RN CM sent a successful outreach letter as discussed with [facility] brochure enclosed for review  Routed not to MD  Cala BradfordKimberly L. Noelle PennerGibbs, RN, BSN, CCM [facility] Telephonic Care Management Care Coordinator Office number [phone] Mobile number [phone]  Main [facility] number [phone] Fax number [phone]

## 2019-04-02 NOTE — Assessment & Plan Note (Signed)
Her pain management provider has switched her from fentanyl 100 mcg patch to morphine extended release 60 mg BID which is not working as well within the last month. She will talk to her provider about potential change.

## 2019-04-02 NOTE — Assessment & Plan Note (Signed)
Taking trazodone and will refill as needed.

## 2019-04-02 NOTE — Progress Notes (Signed)
   Subjective:   Patient ID: Claudia [name], female    DOB: [birth year], 85 y.o.   MRN: [id number]  HPI The patient is an 85 YO female coming in for follow up insomnia (taking trazodone for sleep, denies side effects, seems to be working well at this time) and hyperlipidemia (taking simvastatin, denies muscle aches, denies chest pains or headaches or stroke symptoms, denies side effects) and GERD (taking protonix daily, denies current breakthrough symptoms, denies blood in stool or dark stool, denies missing doses). Pain management has switched her fentanyl patch to morphine slow release and this is not working as well for her. Had a fall this week from weakness and fall in the kitchen. Did hit head but denies LOC. Does have a laceration on her eyebrow but is not sure how this happened.   Review of Systems  Constitutional: Negative.   HENT: Negative.   Eyes: Negative.   Respiratory: Negative for cough, chest tightness and shortness of breath.   Cardiovascular: Negative for chest pain, palpitations and leg swelling.  Gastrointestinal: Negative for abdominal distention, abdominal pain, constipation, diarrhea, nausea and vomiting.  Musculoskeletal: Positive for arthralgias and gait problem.  Skin: Negative.   Neurological: Positive for weakness. Negative for dizziness, tremors, light-headedness and headaches.  Psychiatric/Behavioral: Negative.     Objective:  Physical Exam Constitutional:      Appearance: She is well-developed.  HENT:     Head: Normocephalic and atraumatic.  Neck:     Musculoskeletal: Normal range of motion.  Cardiovascular:     Rate and Rhythm: Normal rate and regular rhythm.  Pulmonary:     Effort: Pulmonary effort is normal. No respiratory distress.     Breath sounds: Normal breath sounds. No wheezing or rales.  Abdominal:     General: Bowel sounds are normal. There is no distension.     Palpations: Abdomen is soft.     Tenderness: There is no abdominal tenderness.  There is no rebound.  Skin:    General: Skin is warm and dry.  Neurological:     Mental Status: She is alert and oriented to person, place, and time.     Coordination: Coordination normal.     Comments: Cane for ambulation    Vitals:   04/02/19 0920  BP: 128/60  Pulse: 81  Temp: 98.8 F (37.1 C)  TempSrc: Oral  SpO2: 95%  Weight: 107 lb (48.5 kg)  Height: 4\' 11"  (1.499 m)    Assessment & Plan:

## 2019-04-02 NOTE — Progress Notes (Signed)
Subjective:   Claudia [name] is a 85 y.o. female who presents for Medicare Annual (Subsequent) preventive examination.  Review of Systems:   Cardiac Risk Factors include: advanced age (>4655men, 18>65 women);dyslipidemia Sleep patterns: has frequent nighttime awakenings, gets up 1-2 times nightly to void and sleeps 4-5 hours nightly. Patient reports insomnia issues, discussed recommended sleep tips.     Home Safety/Smoke Alarms: Feels safe in home. Smoke alarms in place.  Living environment; residence and Firearm Safety: 1-story house/ trailer. Lives alone and caregiver for special needs daughter, no needs for DME, limited support system Seat Belt Safety/Bike Helmet: Wears seat belt.      Objective:     Vitals: There were no vitals taken for this visit.  There is no height or weight on file to calculate BMI.  Advanced Directives 04/02/2019 10/25/2018 12/24/2016 07/22/2015 04/04/2015 11/20/2014 08/13/2014  Does Patient Have a Medical Advance Directive? Yes No No No Yes No No  Type of Estate agentAdvance Directive Healthcare Power of [place] CityAttorney;Living will - - - Public affairs consultantHealthcare Power of [place]orney;Living will - -  Does patient want to make changes to medical advance directive? - - - - No - Patient declined - -  Copy of Healthcare Power of Attorney in Chart? Yes - validated most recent copy scanned in chart (See row information) - - - No - copy requested - -  Would patient like information on creating a medical advance directive? - No - Patient declined - No - patient declined information - No - patient declined information No - patient declined information  Pre-existing out of facility DNR order (yellow form or pink MOST form) - - - - - - -    Tobacco Social History   Tobacco Use  Smoking Status Never Smoker  Smokeless Tobacco Never Used     Counseling given: Not Answered  Past Medical History:  Diagnosis Date  . Back pain   . Cataracts, both eyes   . CHF (congestive heart failure) (HCC)   . Chronic  narcotic use   . Chronic pain   . Coronary artery disease   . Hyperlipidemia   . Hypertension   . Mitral valve prolapse   . Osteoarthritis   . Osteoporosis   . Pain management   . Reflux    Past Surgical History:  Procedure Laterality Date  . ABDOMINAL HYSTERECTOMY    . APPENDECTOMY    . BLADDER SURGERY    . BREAST SURGERY    . SHOULDER SURGERY     Right   Family History  Problem Relation Age of Onset  . Hypertension Mother   . Diabetes Mother   . Heart disease Father   . Hypertension Father   . Diabetes Father    Social History   Socioeconomic History  . Marital status: Widowed    Spouse name: Not on file  . Number of children: 3  . Years of education: Not on file  . Highest education level: Not on file  Occupational History  . Occupation: retired  Engineer, productionocial Needs  . Financial resource strain: Hard  . Food insecurity    Worry: Sometimes true    Inability: Sometimes true  . Transportation needs    Medical: Yes    Non-medical: Yes  Tobacco Use  . Smoking status: Never Smoker  . Smokeless tobacco: Never Used  Substance and Sexual Activity  . Alcohol use: No    Alcohol/week: 0.0 standard drinks  . Drug use: No  . Sexual activity: Never  Lifestyle  . Physical activity    Days per week: 0 days    Minutes per session: 0 min  . Stress: Not on file  Relationships  . Social Musicianconnections    Talks on phone: Three times a week    Gets together: Once a week    Attends religious service: Not on file    Active member of club or organization: Not on file    Attends meetings of clubs or organizations: Not on file    Relationship status: Not on file  Other Topics Concern  . Not on file  Social History Narrative  . Not on file    Outpatient Encounter Medications as of 04/02/2019  Medication Sig  . cloNIDine (CATAPRES) 0.1 MG tablet TAKE 1 TABLET DAILY FOR BLOOD PRESSURE>180 OR >100.  . diltiazem (CARDIZEM CD) 120 MG 24 hr capsule Take 2 capsules (240 mg total) by  mouth daily.  Marland Kitchen. docusate sodium 100 MG CAPS Take 100 mg by mouth daily.  . nitroGLYCERIN (NITROSTAT) 0.4 MG SL tablet Place 0.4 mg under the tongue every 5 (five) minutes as needed for chest pain.  . pantoprazole (PROTONIX) 40 MG tablet Take 1 tablet (40 mg total) by mouth daily.  . polyethylene glycol powder (GLYCOLAX/MIRALAX) powder TAKE 17 GRAMS TWICE A DAY AS NEEDED  . pregabalin (LYRICA) 75 MG capsule   . simvastatin (ZOCOR) 20 MG tablet TAKE 1 TABLET IN THE P.M.  . triamterene-hydrochlorothiazide (MAXZIDE-25) 37.5-25 MG tablet TAKE 1 TABLET EACH DAY.  . [DISCONTINUED] diltiazem (CARDIZEM CD) 120 MG 24 hr capsule Take 2 capsules (240 mg total) by mouth daily.  . [DISCONTINUED] fentaNYL (DURAGESIC - DOSED MCG/HR) 100 MCG/HR Place 100 mcg onto the skin every other day.   . [DISCONTINUED] nitrofurantoin, macrocrystal-monohydrate, (MACROBID) 100 MG capsule Take 1 capsule (100 mg total) by mouth 2 (two) times daily.  . [DISCONTINUED] pantoprazole (PROTONIX) 40 MG tablet Take 1 tablet (40 mg total) by mouth daily.  . [DISCONTINUED] simvastatin (ZOCOR) 20 MG tablet TAKE 1 TABLET IN THE P.M.  . [DISCONTINUED] traZODone (DESYREL) 50 MG tablet Take 1-2 tablets (50-100 mg total) by mouth at bedtime as needed for sleep.  . [DISCONTINUED] triamterene-hydrochlorothiazide (MAXZIDE-25) 37.5-25 MG tablet TAKE 1 TABLET EACH DAY.   No facility-administered encounter medications on file as of 04/02/2019.     Activities of Daily Living In your present state of health, do you have any difficulty performing the following activities: 04/02/2019  Hearing? N  Vision? N  Difficulty concentrating or making decisions? N  Walking or climbing stairs? Y  Dressing or bathing? N  Doing errands, shopping? Y  Preparing Food and eating ? Y  Using the Toilet? N  In the past six months, have you accidently leaked urine? N  Do you have problems with loss of bowel control? N  Managing your Medications? N  Managing your  Finances? N  Housekeeping or managing your Housekeeping? Y  Some recent data might be hidden    Patient Care Team: Myrlene Brokerrawford, Elizabeth A, MD as PCP - General (Internal Medicine) Duffy, Vella KohlerLynn Z, LCSW as Social Worker (Licensed Clinical Social Worker)    Assessment:   This is a routine wellness examination for Klare. Physical assessment deferred to PCP.  Exercise Activities and Dietary recommendations Current Exercise Habits: The patient does not participate in regular exercise at present, Exercise limited by: orthopedic condition(s)  Diet (meal preparation, eat out, water intake, caffeinated beverages, dairy products, fruits and vegetables): in general, an "unhealthy" diet Reports  poor appetite and difficulty preparing meals.    Discussed supplementing with Ensure, samples and coupons provided. Encouraged patient to increase daily water and healthy fluid intake.   Goals      Patient Stated   . maintain health (pt-stated)     Will continue to maintain health and will drink nutritional supplements like Ensure and Boost.        Fall Risk Fall Risk  04/02/2019 08/26/2018 05/08/2018 09/15/2016 08/20/2015  Falls in the past year? 1 1 No No No  Comment - Emmi Telephone Survey: data to providers prior to load Emmi Telephone Survey: data to providers prior to load Emmi Telephone Survey: data to providers prior to load -  Number falls in past yr: 1 1 - - -  Comment - Emmi Telephone Survey Actual Response = 51 - - -  Injury with Fall? 1 0 - - -  Risk for fall due to : Impaired balance/gait;Impaired mobility - - - -  Follow up Falls prevention discussed;Education provided - - - -    Depression Screen PHQ 2/9 Scores 04/02/2019 08/20/2015 07/26/2015  PHQ - 2 Score 2 0 0  PHQ- 9 Score 8 - -     Cognitive Function MMSE - Mini Mental State Exam 04/02/2019 08/20/2015  Not completed: - (No Data)  Orientation to time 5 -  Orientation to Place 5 -  Registration 3 -  Attention/ Calculation 4  -  Recall 1 -  Language- name 2 objects 2 -  Language- repeat 1 -  Language- follow 3 step command 3 -  Language- read & follow direction 1 -  Write a sentence 1 -  Copy design 1 -  Total score 27 -        Immunization History  Administered Date(s) Administered  . Influenza, High Dose Seasonal PF 08/26/2014, 06/16/2016, 12/19/2017, 12/16/2018  . Influenza,inj,Quad PF,6+ Mos 06/17/2015  . Pneumococcal Conjugate-13 11/13/2014  . Pneumococcal Polysaccharide-23 08/16/1999  . Tdap 11/13/2014   Screening Tests Health Maintenance  Topic Date Due  . INFLUENZA VACCINE  05/10/2019  . TETANUS/TDAP  11/13/2024  . DEXA SCAN  Completed  . PNA vac Low Risk Adult  Completed      Plan:     Reviewed health maintenance screenings with patient today and relevant education, vaccines, and/or referrals were provided.   Community senior resources provided. Nurse will follow-up on application for Meals on Wheels and send a referral to [facility] for SW assistance.  I have personally reviewed and noted the following in the patient's chart:   . Medical and social history . Use of alcohol, tobacco or illicit drugs  . Current medications and supplements . Functional ability and status . Nutritional status . Physical activity . Advanced directives . List of other physicians . Vitals . Screenings to include cognitive, depression, and falls . Referrals and appointments  In addition, I have reviewed and discussed with patient certain preventive protocols, quality metrics, and best practice recommendations. A written personalized care plan for preventive services as well as general preventive health recommendations were provided to patient.     Michiel Cowboy, RN  04/02/2019

## 2019-04-02 NOTE — Assessment & Plan Note (Signed)
Checking lipid panel and adjust simvastatin as needed.  

## 2019-04-02 NOTE — Assessment & Plan Note (Signed)
BP at goal and no symptoms of dizziness to explain recent fall. Will continue diltiazem and hctz and clonidine. Checking CMP and adjust as needed.

## 2019-04-03 ENCOUNTER — Other Ambulatory Visit: Payer: Self-pay

## 2019-04-03 NOTE — Patient Outreach (Signed)
[facility]) Care Management  04/03/2019  Jackye Dever Mervin April 27, 1931 [id number]   Social work referral received from [employer], Joellyn Quails.  "Patient needs community resources per MD referral reason that states patient cares for special needs daughter and having difficult preparing meals and has financial issues. Little to no assistance. Mrs Stenseth is requesting assistance with resources for home improvements in her bathroom, meals on wheels, financial assistance, possible legal services related to advance directives and clarification of her legal guardianship forms, DME (Ensure, Boost or other nutritional supplemental supplies) and alternate transportation resources". Successful outreach to patient today.  Home modification:  Patient reports that she and her daughter both have difficulty getting in and out of the tub at her home.  She is requesting grab bars and/or modification of tub.  BSW talked with her about home modification services through [place].  Due to [place] restrictions, they are currently not completing modifications inside homes.  It is my understanding, however, that they will still accept a new referral and start the paperwork process.  Referral was submitted and asked for clarification about current process.    Meals on Wheels:  Patient reports that family members are currently assisting with groceries.  She and daughter often eat frozen meals because they are easy to prepare.  BSW submitted referral to Meals on Wheels program.  BSW also mailed Ensure coupons.  Financial assistance:  Informed patient about [place]211 that can be used for multiple resources.  Legal Services:  BSW mailed information about Legal Aid services as patient reports that she cannot afford an attorney.  Transportation:  BSW and patient discussed SCAT services, however, patient may not qualify because she is still able to drive.  Eligibility for SCAT services is based on physical and  cognitive functioning, not financial means.  Patient declined applying for services at this time.  BSW will follow up with patient when response is received about Independent Living referral.  BSW will also follow up within the next two weeks to ensure receipt of information mailed.   Ronn Melena, BSW Social Worker [phone]

## 2019-04-04 ENCOUNTER — Other Ambulatory Visit: Payer: Self-pay

## 2019-04-04 ENCOUNTER — Other Ambulatory Visit: Payer: Medicare Other | Admitting: *Deleted

## 2019-04-04 DIAGNOSIS — Z515 Encounter for palliative care: Secondary | ICD-10-CM

## 2019-04-07 NOTE — Progress Notes (Addendum)
COMMUNITY PALLIATIVE CARE RN NOTE  PATIENT NAME: Claudia [name] DOB: 1931/04/10 MRN: [id number]  PRIMARY CARE PROVIDER: Hoyt Koch, MD  RESPONSIBLE PARTY:  Acct ID - Guarantor Home Phone Work Phone Relationship Acct Type  [id number] KEYDI, [phone]  Self P/F     [address] , [place], [place] [address]   Due to the COVID-19 crisis, this virtual check-in visit was done via telephone from my office and it was initiated and consent by this patient and or family.  PLAN OF CARE and INTERVENTION:  1. ADVANCE CARE PLANNING/GOALS OF CARE: Goal is to remain at home for as long as possible. She is a DNR. 2. PATIENT/CAREGIVER EDUCATION: Pain Management 3. DISEASE STATUS: Virtual check-in visit completed via telephone with patient. Patient reports continued pain in her lower back. She continues to see Pain Management. She says that she has recently had a change in her pain medications. She is no longer on the Fentanyl patch, 100 mcg every 48 hours. She has now been placed on Morphine ER 60 mg every 8 hours per patient report. She says that she does have some difficulty keeping up with taking this medication every 8 hours. Patient states her pain is about the same as when she was on the Fentanyl patch. She had a recent fall about a week ago in her kitchen. She states that she was putting a frozen dinner in her microwave, which sits about her stove, and fell all of a sudden. Unsure of cause. She slid across the floor and hit her face. She sustained a small cut above her R eye. She reports bruising to R eye and R upper jaw. Her daughter asked if she wanted to go to the ED and patient refused. She has been using cold compresses to help with swelling. She continues to ambulate using her walker and able to perform ADLs independently, but requires rest periods between. Her daughter brings food over and does her grocery shopping for her. She reports not having much of an appetite. She says that some days  she will not eat anything, and other days may eat 1-2 meals of very small portions. She often forces herself to eat. Current weight 107 lbs. Patient continues to care for her 83 yo special needs grand-daughter [name]. Will continue to monitor.   HISTORY OF PRESENT ILLNESS:  This is a 85 yo female who resides in her home. Palliative Care Team continues to follow patient. Will continue to check in with patient monthly and PRN.   CODE STATUS: DNR ADVANCED DIRECTIVES: Y (Living Will/HCPOA) MOST FORM: no PPS: 50%   (Duration of visit and documentation 60 minutes)   Daryl Eastern, RN

## 2019-04-08 ENCOUNTER — Other Ambulatory Visit: Payer: Self-pay

## 2019-04-08 NOTE — Patient Outreach (Signed)
[facility]) Care Management  04/08/2019  Claudia [name] 06/15/1931 [id number]   Have not received response from Lynder Parents at [place] regarding referral submitted on 04/03/19 for bathroom modification.  Left message with receptionist today requesting call back from [place].  Ronn Melena, BSW Social Worker [phone]

## 2019-04-10 ENCOUNTER — Telehealth: Payer: Self-pay | Admitting: Internal Medicine

## 2019-04-10 NOTE — Telephone Encounter (Signed)
Patient stated she cannot take traZODone (DESYREL) 50 MG tablet medication.  It makes her weak and dizzy.  Please advise.

## 2019-04-14 NOTE — Telephone Encounter (Signed)
Is this new, she has been taking trazodone for a long time? She did not mention at recent visit.

## 2019-04-14 NOTE — Telephone Encounter (Signed)
LVM for patient to call back for MD response  

## 2019-04-15 ENCOUNTER — Other Ambulatory Visit: Payer: Self-pay

## 2019-04-15 ENCOUNTER — Other Ambulatory Visit: Payer: Medicare Other | Admitting: Licensed Clinical Social Worker

## 2019-04-15 DIAGNOSIS — Z515 Encounter for palliative care: Secondary | ICD-10-CM

## 2019-04-15 NOTE — Progress Notes (Signed)
COMMUNITY PALLIATIVE CARE SW NOTE  PATIENT NAME: Claudia [name] DOB: [birth year] MRN: [id number]  PRIMARY CARE PROVIDER: Hoyt Koch, MD  RESPONSIBLE PARTY:  Acct ID - Guarantor Home Phone Work Phone Relationship Acct Type  [id number] BLISS, [phone]  Self P/F     [address] , [place], [place] [address]   Due to the COVID-19 crisis, this virtual check-in visit was done via telephone from my office and it was initiated and consent given by this patient and or family.   PLAN OF CARE and INTERVENTIONS:             1. GOALS OF CARE/ ADVANCE CARE PLANNING:  Patient's goal is to remain in her home with her granddaughter, [name].  Patient has a DNR. 2. SOCIAL/EMOTIONAL/SPIRITUAL ASSESSMENT/ INTERVENTIONS:  SW conducted a virtual check-in visit with patient in her home.  She said the morphine was not taking away her pain.  She also went to a wellness doctor who gave her coupons for Ensure, which she hasn't purchased yet.  Her son from [place] is coming to visit her tomorrow, and she is very excited.  SW provided active listening and supportive counseling. 3. PATIENT/CAREGIVER EDUCATION/ COPING:  Patient copes by expressing her feelings openly. 4. PERSONAL EMERGENCY PLAN:  Patient will contact her daughter. 5. COMMUNITY RESOURCES COORDINATION/ HEALTH CARE NAVIGATION:  None. 6. FINANCIAL/LEGAL CONCERNS/INTERVENTIONS:  Patient is on a fixed income.     SOCIAL HX:  Social History   Tobacco Use  . Smoking status: Never Smoker  . Smokeless tobacco: Never Used  Substance Use Topics  . Alcohol use: No    Alcohol/week: 0.0 standard drinks    CODE STATUS:  DNR  ADVANCED DIRECTIVES: N MOST FORM COMPLETE:  N HOSPICE EDUCATION PROVIDED: N PPS:  Patient reports her appetite is normal, but that she has lost weight.  She uses a walker to ambulate. Duration of visit and documentation:  45 minutes.      Creola Corn Bre P[place]ina, LCSW

## 2019-04-16 ENCOUNTER — Other Ambulatory Visit: Payer: Self-pay

## 2019-04-16 NOTE — Patient Outreach (Signed)
[facility]) Care Management  04/16/2019  Nicoletta Hush Chervenak March 26, 1931 [id number]   Left voicemail message in general mailbox at [facility] regarding referral for bathroom modification.   BSW sent secure emails on 04/03/19 and 04/15/19 to Caseworker, Lynder Parents.  Also left message with Web designer, [name], on 04/08/19.  Have not received any return communication.  Will attempt outreach again next week if no response.  Addendum: Received return call from [name] who will pass message to [facility] Parents again.   Ronn Melena, BSW Social Worker [phone]

## 2019-04-22 ENCOUNTER — Other Ambulatory Visit: Payer: Self-pay

## 2019-04-22 NOTE — Patient Outreach (Signed)
[facility]) Care Management  04/22/2019  Claudia [name] 04-05-1931 [id number]   Left third voicemail message with [name] at [facility] regarding referral for bathroom modification.   BSW sent secure emails on 04/03/19 and 04/15/19 to Caseworker, Lynder Parents.  Also left message with Web designer, [name], on 04/08/19, 04/16/19, and today.  Have not received any return communication from [name].  [name] agreed to pass message to [name] as well as her supervisor today.  Will attempt outreach again if no response by the end of the week.  Ronn Melena, BSW Social Worker [phone]

## 2019-04-28 ENCOUNTER — Other Ambulatory Visit: Payer: Self-pay

## 2019-04-28 DIAGNOSIS — M545 Low back pain: Secondary | ICD-10-CM | POA: Diagnosis not present

## 2019-04-28 DIAGNOSIS — I10 Essential (primary) hypertension: Secondary | ICD-10-CM | POA: Diagnosis not present

## 2019-04-28 DIAGNOSIS — M48061 Spinal stenosis, lumbar region without neurogenic claudication: Secondary | ICD-10-CM | POA: Diagnosis not present

## 2019-04-28 DIAGNOSIS — M4316 Spondylolisthesis, lumbar region: Secondary | ICD-10-CM | POA: Diagnosis not present

## 2019-04-28 NOTE — Patient Outreach (Signed)
[facility]) Care Management  04/28/2019  Claudia [name] 07-23-1931 [id number]   Return call received from [facility] with Independent Living.  Discussed referral for tub modification.  Ellison Hughs will reach out to patient about referral and follow up with me.  Ronn Melena, BSW Social Worker [phone]

## 2019-04-29 ENCOUNTER — Telehealth: Payer: Self-pay | Admitting: *Deleted

## 2019-04-29 NOTE — Telephone Encounter (Signed)
Copied from [place] [id number]. Topic: General - Inquiry >> Apr 28, 2019  2:32 PM Percell Belt A wrote: Reason for CRM: pt called and state that she does want to take the traZODone (DESYREL) 50 MG tablet [[id number]]   she state she no longer wants to take this med FYI and she does not want anything in the place of it

## 2019-04-29 NOTE — Telephone Encounter (Signed)
MD is out of the office this week. Noted and will hold for MD FYI

## 2019-05-05 NOTE — Telephone Encounter (Signed)
Fine

## 2019-05-07 ENCOUNTER — Telehealth: Payer: Self-pay | Admitting: Internal Medicine

## 2019-05-07 NOTE — Telephone Encounter (Signed)
Patient informed that she should have refills available at her pharmacy till 03/2020 and to call them and get what she needs refilled

## 2019-05-07 NOTE — Telephone Encounter (Signed)
Pt called and is requesting a refill on maxzide, simvastatin, pantoprazole, and dilitiazem. Pt states she only has enough to last until 05/08/2019. Please advise.   [place], [place] [address]  Phone: [phone] Fax: [phone]  Not a 24 hour pharmacy; exact hours not known.

## 2019-05-21 ENCOUNTER — Other Ambulatory Visit: Payer: Medicare Other | Admitting: *Deleted

## 2019-05-21 ENCOUNTER — Other Ambulatory Visit: Payer: Self-pay

## 2019-05-21 DIAGNOSIS — Z515 Encounter for palliative care: Secondary | ICD-10-CM | POA: Diagnosis not present

## 2019-05-23 ENCOUNTER — Other Ambulatory Visit: Payer: Self-pay

## 2019-05-23 NOTE — Patient Outreach (Signed)
[facility]) Care Management  05/23/2019  Claudia [name] 10/20/1930 [id number]   Received the following communication from [facility] with [place] regarding referral for tub modification.  "I got in contact with [name] on 05/20/19  and completed her intake. She has been made aware that we will be sending her a referral packet in the mail and what she can expect once she send the forms back to [place]." Will follow up with patient within the next two weeks to ensure receipt of paperwork.  Ronn Melena, BSW Social Worker [phone]

## 2019-05-24 NOTE — Progress Notes (Signed)
COMMUNITY PALLIATIVE CARE RN NOTE  PATIENT NAME: Claudia [name] DOB: Jan 19, 1931 MRN: [id number]  PRIMARY CARE PROVIDER: Hoyt Koch, MD  RESPONSIBLE PARTY:  Acct ID - Guarantor Home Phone Work Phone Relationship Acct Type  [id number] JEANNIA, [phone]  Self P/F     [address] , [place], [place] [address]   Due to the COVID-19 crisis, this virtual check-in visit was done via telephone from my office and it was initiated and consent by this patient and or family.  PLAN OF CARE and INTERVENTION:  1. ADVANCE CARE PLANNING/GOALS OF CARE: Goal is for patient to remain in her home. She is a DNR. 2. PATIENT/CAREGIVER EDUCATION: Reinforced Safe Mobility 3. DISEASE STATUS: Virtual check-in visit completed via telephone. Patient states that she continues with lower back pain and is still taking Morphine 60 mg every 8 hours. She says that sometimes it helps, and other times it doesn't. She also says that she forgets to take some doses. [facility]. She remains ambulatory using her walker and able to perform ADLs independently, just at a slower rate. She speaks of the fall she had about 2 months ago, and that she still has a small knot located directly above her right eye that causes pain, mainly at night. No recent falls since then. Her intake is poor. Some does she does not realize that she has not had anything to eat all day due to lack of appetite. She does try to drink Ensure for added nutritional supplementation. She is still caring for her granddaughter, who has special needs which can be difficult at times due to her limited mobility. Her daughter does her grocery shopping for her, which is a big help. Will continue to monitor.  HISTORY OF PRESENT ILLNESS:  This is a 85 yo female who resides at home alone and takes care of her granddaughter. Palliative care team continues to follow patient. Will continue to visit patient monthly and prn.  CODE STATUS: DNR   ADVANCED DIRECTIVES: Y MOST FORM: no PPS: 50%   (Duration of visit and documentation 60 minutes)   Daryl Eastern, RN BSN

## 2019-06-05 ENCOUNTER — Other Ambulatory Visit: Payer: Self-pay

## 2019-06-05 NOTE — Patient Outreach (Signed)
[facility]) Care Management  06/05/2019  Lateesha Bezold Gabrys 23-Jan-1931 [id number]   Attempted to contact patient today regarding status of referral to Independent Living.  Left voicemail message.  Will attempt to reach again within four business days.  Ronn Melena, BSW Social Worker [phone]

## 2019-06-06 ENCOUNTER — Ambulatory Visit: Payer: Self-pay

## 2019-06-10 ENCOUNTER — Ambulatory Visit: Payer: Self-pay

## 2019-06-10 ENCOUNTER — Other Ambulatory Visit: Payer: Self-pay

## 2019-06-10 NOTE — Patient Outreach (Signed)
[facility]) Care Management  06/10/2019  Chaylee Ehrsam Bert 1931-06-04 [id number]   Second attempt to contact patient regarding status of referral to [facility].  Left voicemail message.Mailed Unsuccessful Outreach Letter.  Will attempt to reach again within four business days.  Ronn Melena, BSW Social Worker [phone]

## 2019-06-13 ENCOUNTER — Other Ambulatory Visit: Payer: Self-pay

## 2019-06-13 ENCOUNTER — Ambulatory Visit: Payer: Self-pay

## 2019-06-13 NOTE — Patient Outreach (Signed)
[facility]) Care Management  06/13/2019  Marixa Mellott Hagner 1931-10-03 [id number]   Third attempt to contact patient regarding status of referral to Independent Living. Left voicemail message.Mailed Unsuccessful Outreach Letter on 06/10/19.  Sent secure message to [facility] with Independent Living to inquire about whether or not she has received documentation from patient.  Will attempt to contact patient again next month.   Ronn Melena, BSW Social Worker [phone]

## 2019-06-17 ENCOUNTER — Telehealth: Payer: Self-pay | Admitting: Licensed Clinical Social Worker

## 2019-06-17 NOTE — Telephone Encounter (Signed)
Palliative Care left a vm to schedule a visit.

## 2019-07-07 ENCOUNTER — Other Ambulatory Visit: Payer: Medicare Other | Admitting: Licensed Clinical Social Worker

## 2019-07-07 DIAGNOSIS — Z515 Encounter for palliative care: Secondary | ICD-10-CM

## 2019-07-08 ENCOUNTER — Other Ambulatory Visit: Payer: Self-pay

## 2019-07-08 NOTE — Progress Notes (Signed)
COMMUNITY PALLIATIVE CARE SW NOTE  PATIENT NAME: Claudia [name] DOB: 1931-08-06 MRN: [id number]  PRIMARY CARE PROVIDER: Hoyt Koch, MD  RESPONSIBLE PARTY:  Acct ID - Guarantor Home Phone Work Phone Relationship Acct Type  [id number] ELVINA, [phone]  Self P/F     [address] , [place], [place] [id number]   Due to the COVID-19 crisis, this virtual check-in visit was done via telephone from my office and it was initiated and consent given by this patient and or family.  PLAN OF CARE and INTERVENTIONS:             1. GOALS OF CARE/ ADVANCE CARE PLANNING:  Goal is for patient to remain in her home.  She has a DNR. 2. SOCIAL/EMOTIONAL/SPIRITUAL ASSESSMENT/ INTERVENTIONS:  SW conducted a virtual check in visit with patient's daughter, Claudia [name].  Informed her that SW attempted to contact patient, but her number was changed.  [name] provided patient's new phone number.  Patient's pain continues to be an issue, but is less intense.  Daughter feels patient's hearing has worsened. She is using Ensure now.  Her family continues to be supportive.  SW provided active listening and supportive counseling. 3. PATIENT/CAREGIVER EDUCATION/ COPING:  Daughter copes by expressing her feelings openly. 4. PERSONAL EMERGENCY PLAN:  Patient will call her daughter. 5. COMMUNITY RESOURCES COORDINATION/ HEALTH CARE NAVIGATION:  None. 6. FINANCIAL/LEGAL CONCERNS/INTERVENTIONS:  She is on a fixed income.     SOCIAL HX:  Social History   Tobacco Use  . Smoking status: Never Smoker  . Smokeless tobacco: Never Used  Substance Use Topics  . Alcohol use: No    Alcohol/week: 0.0 standard drinks    CODE STATUS:  DNR ADVANCED DIRECTIVES: N MOST FORM COMPLETE:  N HOSPICE EDUCATION PROVIDED:  N PPS:  Patient's appetite is normal.  She ambulates with a walker. Duration of visit and documentation:  45 minutes.      Creola Corn Ameliana Brashear, LCSW

## 2019-07-15 ENCOUNTER — Ambulatory Visit: Payer: Self-pay

## 2019-07-15 ENCOUNTER — Other Ambulatory Visit: Payer: Self-pay

## 2019-07-15 NOTE — Patient Outreach (Signed)
[facility]) Care Management  07/15/2019  Julie Nay Tri [birth year] [id number]   Messaged Ellison Hughs Tharrington with Independent Living regarding status of referral.  Will follow up with patient when response is received.  Ronn Melena, BSW Social Worker [phone]

## 2019-07-21 ENCOUNTER — Other Ambulatory Visit: Payer: Self-pay

## 2019-07-21 NOTE — Patient Outreach (Signed)
[facility]) Care Management  07/21/2019  Jakya Dovidio [address] 1931/09/08 [id number]   Received the following communication from Estanislado Pandy with [facility] regarding referral for tub modification.   "We are still waiting on Medical records from [facility] to come in for Ms. Bourdeau. Its been a real process with the getting the medical records which is expected but as soon as I can get that set of notes I can provide you with a status update" Will follow up again next month if no update received before that time.  Ronn Melena, BSW Social Worker [phone]

## 2019-07-23 DIAGNOSIS — M48062 Spinal stenosis, lumbar region with neurogenic claudication: Secondary | ICD-10-CM | POA: Diagnosis not present

## 2019-07-23 DIAGNOSIS — M545 Low back pain: Secondary | ICD-10-CM | POA: Diagnosis not present

## 2019-07-25 ENCOUNTER — Telehealth: Payer: Self-pay

## 2019-07-25 NOTE — Telephone Encounter (Signed)
SW attempted to contact patient and daughter, [name] to schedule palliative care visit. Left VM with [name] and provided contact information.

## 2019-08-01 ENCOUNTER — Telehealth: Payer: Self-pay

## 2019-08-01 NOTE — Telephone Encounter (Signed)
Telephone call to schedule palliative care visit.  RN unable to reach patient and phone would not let RN leave message.  RN called patients daughter Cathi Roan and daughter reports patient is able to make her own appointments.  Daughter states patient is very [facility] and may not hear phone ringing.  RN will continue to attempt to reach patient.

## 2019-08-04 ENCOUNTER — Telehealth: Payer: Self-pay | Admitting: Internal Medicine

## 2019-08-04 ENCOUNTER — Telehealth: Payer: Self-pay

## 2019-08-04 NOTE — Telephone Encounter (Signed)
fyi

## 2019-08-04 NOTE — Telephone Encounter (Signed)
RN was able to reach patient on the phone this AM.  Patient states she "does not want ya'll coming out here."  Patient states "I;m doing okay" and no longer wants to receive palliative care services. RN called and left message with Nate at Dr Charlynne Cousins office.  RN also emailed palliative care admin team to update patient does not want palliative care services.

## 2019-08-04 NOTE — Telephone Encounter (Signed)
Copied from [place] [phone]. Topic: General - Other >> Aug 04, 2019 11:46 AM Rainey Pines A wrote: Wray Kearns from [facility] called to inform Dr. Sharlet Salina that the patient stated that she is doing good and feels she no longer needs the service. Virgie can be reached at  [phone]

## 2019-08-08 NOTE — Telephone Encounter (Signed)
Noted patient has stopped

## 2019-08-08 NOTE — Telephone Encounter (Signed)
Pt stated she does not want traZODone (DESYREL) 50 MG tablet Because it makes her sick. Requesting it to be d/c. Please advise.

## 2019-08-08 NOTE — Telephone Encounter (Signed)
It is okay to stop.

## 2019-09-01 ENCOUNTER — Other Ambulatory Visit: Payer: Self-pay

## 2019-09-01 ENCOUNTER — Ambulatory Visit: Payer: Self-pay

## 2019-09-01 NOTE — Patient Outreach (Signed)
[facility]) Care Management  09/01/2019  Claudia [name] [birth year] [id number]   Messaged Ellison Hughs Tharrington with Independent Living regarding status of referral for tub modification.  If necessary, will follow up with patient once response is received.  Otherwise, will outreach [place] again within the next two months for another update.  Processing of referrals is being delayed due to [place]; will continue to outreach for update every two months.   Ronn Melena, BSW Social Worker [phone]

## 2019-09-10 ENCOUNTER — Other Ambulatory Visit: Payer: Self-pay

## 2019-09-10 NOTE — Patient Outreach (Signed)
[facility]) Care Management  09/10/2019  Lesleyanne Politte Traughber [birth year] [id number]   Communicated with Bill Salinas from [facility] regarding status of referral for tub modification.  Per Caren Griffins, "we are still waiting on medical documentation from  doctor, and I will refax consents on my next office visit on Friday" Will follow up again within the next two months.  Ronn Melena, BSW Social Worker [phone]

## 2019-10-30 ENCOUNTER — Encounter: Payer: Self-pay | Admitting: Internal Medicine

## 2019-10-30 ENCOUNTER — Other Ambulatory Visit: Payer: Self-pay

## 2019-10-30 ENCOUNTER — Ambulatory Visit (INDEPENDENT_AMBULATORY_CARE_PROVIDER_SITE_OTHER): Payer: Medicare Other | Admitting: Internal Medicine

## 2019-10-30 VITALS — BP 138/72 | HR 75 | Temp 99.2°F | Ht 59.0 in | Wt 108.0 lb

## 2019-10-30 DIAGNOSIS — E785 Hyperlipidemia, unspecified: Secondary | ICD-10-CM

## 2019-10-30 DIAGNOSIS — Z0000 Encounter for general adult medical examination without abnormal findings: Secondary | ICD-10-CM

## 2019-10-30 DIAGNOSIS — Z23 Encounter for immunization: Secondary | ICD-10-CM | POA: Diagnosis not present

## 2019-10-30 LAB — COMPREHENSIVE METABOLIC PANEL
ALT: 9 U/L (ref 0–35)
AST: 14 U/L (ref 0–37)
Albumin: 4 g/dL (ref 3.5–5.2)
Alkaline Phosphatase: 60 U/L (ref 39–117)
BUN: 18 mg/dL (ref 6–23)
CO2: 32 mEq/L (ref 19–32)
Calcium: 9.3 mg/dL (ref 8.4–10.5)
Chloride: 99 mEq/L (ref 96–112)
Creatinine, Ser: 0.94 mg/dL (ref 0.40–1.20)
GFR: 56.34 mL/min — ABNORMAL LOW (ref >60.00)
Glucose, Bld: 102 mg/dL — ABNORMAL HIGH (ref 70–99)
Potassium: 3.6 mEq/L (ref 3.5–5.1)
Sodium: 138 mEq/L (ref 135–145)
Total Bilirubin: 0.3 mg/dL (ref 0.2–1.2)
Total Protein: 6.8 g/dL (ref 6.0–8.3)

## 2019-10-30 LAB — CBC
HCT: 37.2 % (ref 36.0–46.0)
Hemoglobin: 12.5 g/dL (ref 12.0–15.0)
MCHC: 33.7 g/dL (ref 30.0–36.0)
MCV: 85.1 fl (ref 78.0–100.0)
Platelets: 263 10*3/uL (ref 150.0–400.0)
RBC: 4.37 Mil/uL (ref 3.87–5.11)
RDW: 13.5 % (ref 11.5–15.5)
WBC: 5.2 10*3/uL (ref 4.0–10.5)

## 2019-10-30 LAB — LIPID PANEL
Cholesterol: 174 mg/dL (ref 0–200)
HDL: 68.4 mg/dL (ref >39.00)
LDL Cholesterol: 92 mg/dL (ref 0–99)
NonHDL: 105.11
Total CHOL/HDL Ratio: 3
Triglycerides: 67 mg/dL (ref 0.0–149.0)
VLDL: 13.4 mg/dL (ref 0.0–40.0)

## 2019-10-30 NOTE — Patient Instructions (Signed)
We will check the labs today. 

## 2019-10-30 NOTE — Progress Notes (Signed)
   Subjective:   Patient ID: Claudia [name], female    DOB: 1931/04/26, 86 y.o.   MRN: [id number]  HPI The patient is an 86 YO female coming in for physical.   PMH, FMH, social history reviewed and updated  Review of Systems  Constitutional: Negative.   HENT: Negative.   Eyes: Negative.   Respiratory: Negative for cough, chest tightness and shortness of breath.   Cardiovascular: Negative for chest pain, palpitations and leg swelling.  Gastrointestinal: Negative for abdominal distention, abdominal pain, constipation, diarrhea, nausea and vomiting.  Musculoskeletal: Positive for arthralgias, back pain and myalgias.  Skin: Negative.   Neurological: Negative.   Psychiatric/Behavioral: Negative.     Objective:  Physical Exam Constitutional:      Appearance: She is well-developed.  HENT:     Head: Normocephalic and atraumatic.  Cardiovascular:     Rate and Rhythm: Normal rate and regular rhythm.  Pulmonary:     Effort: Pulmonary effort is normal. No respiratory distress.     Breath sounds: Normal breath sounds. No wheezing or rales.  Abdominal:     General: Bowel sounds are normal. There is no distension.     Palpations: Abdomen is soft.     Tenderness: There is no abdominal tenderness. There is no rebound.  Musculoskeletal:        General: Tenderness present.     Cervical back: Normal range of motion.  Skin:    General: Skin is warm and dry.  Neurological:     Mental Status: She is alert and oriented to person, place, and time.     Coordination: Coordination normal.     Vitals:   10/30/19 1532  BP: 138/72  Pulse: 75  Temp: 99.2 F (37.3 C)  TempSrc: Oral  SpO2: 96%  Weight: 108 lb (49 kg)  Height: 4\' 11"  (1.499 m)    This visit occurred during the SARS-CoV-2 public health emergency.  Safety protocols were in place, including screening questions prior to the visit, additional usage of staff PPE, and extensive cleaning of exam room while observing appropriate  contact time as indicated for disinfecting solutions.   Assessment & Plan:  Flu shot given at visit

## 2019-10-31 ENCOUNTER — Ambulatory Visit: Payer: Medicare Other

## 2019-11-01 NOTE — Assessment & Plan Note (Signed)
Flu shot given. Pneumonia complete. Shingrix counseled. Tetanus up to date. Colonoscopy aged out. Mammogram aged out, pap smear aged out and dexa declines. Counseled about sun safety and mole surveillance. Counseled about the dangers of distracted driving. Given 10 year screening recommendations.

## 2019-11-11 ENCOUNTER — Other Ambulatory Visit: Payer: Self-pay

## 2019-11-11 NOTE — Patient Outreach (Signed)
[facility]) Care Management  11/11/2019  Claudia [name] [birth year] [id number]   Messaged Tracey McMasters from Independent [facility] regarding status of referral for tub modification.  Awaiting response.  Malachy Chamber, BSW Social Worker [phone]

## 2019-11-17 ENCOUNTER — Other Ambulatory Visit: Payer: Self-pay

## 2019-11-17 NOTE — Patient Outreach (Signed)
[facility]) Care Management  11/17/2019  Eloyse Causey Handler 07/06/1931 [id number]   [facility] Social Work case transferred to [employer], Reece Levy.  Malachy Chamber, BSW Social Worker [phone]

## 2019-11-19 ENCOUNTER — Ambulatory Visit: Payer: Self-pay | Admitting: *Deleted

## 2019-11-19 ENCOUNTER — Other Ambulatory Visit: Payer: Self-pay | Admitting: *Deleted

## 2019-11-19 NOTE — Patient Outreach (Signed)
[facility]) Care Management  11/19/2019  Claudia [name] 10/29/1930 [id number]   CSW attempted to reach pt today by phone and was unsuccessful. CSW was able to speak with Independent Living rep who is also planning to call pt to confirm receipt of packet/application. CSW will try again in 3-4 business days.  Reece Levy, MSW, LCSW Clinical Social Worker  [facility] [phone]

## 2019-11-21 ENCOUNTER — Ambulatory Visit: Payer: Medicare Other | Attending: Internal Medicine

## 2019-11-21 DIAGNOSIS — Z23 Encounter for immunization: Secondary | ICD-10-CM | POA: Insufficient documentation

## 2019-11-21 NOTE — Progress Notes (Signed)
   Covid-19 Vaccination Clinic  Name:  Claudia [name]    MRN: [id number] DOB: 1931-06-03  11/21/2019  Claudia [name] was observed post Covid-19 immunization for 15 minutes without incidence. She was provided with Vaccine Information Sheet and instruction to access the V-Safe system.   Claudia [name] was instructed to call 911 with any severe reactions post vaccine: Marland Kitchen Difficulty breathing  . Swelling of your face and throat  . A fast heartbeat  . A bad rash all over your body  . Dizziness and weakness    Immunizations Administered    Name Date Dose VIS Date Route   Pfizer COVID-19 Vaccine 11/21/2019  5:24 PM 0.3 mL 09/19/2019 Intramuscular   Manufacturer: [employer], [employer]   Lot: [id number]   NDC: [address]-6

## 2019-11-24 ENCOUNTER — Ambulatory Visit: Payer: Self-pay | Admitting: *Deleted

## 2019-11-25 ENCOUNTER — Ambulatory Visit: Payer: Self-pay | Admitting: *Deleted

## 2019-11-26 ENCOUNTER — Ambulatory Visit: Payer: Self-pay | Admitting: *Deleted

## 2019-12-03 ENCOUNTER — Ambulatory Visit: Payer: Self-pay | Admitting: *Deleted

## 2019-12-09 ENCOUNTER — Other Ambulatory Visit: Payer: Self-pay | Admitting: *Deleted

## 2019-12-09 NOTE — Patient Outreach (Signed)
[facility]) Care Management  12/09/2019  Doree Kuehne Garczynski 02/14/1931 [id number]   CSW made contact with pt's daughter, [name], who reports she lives about 5-7 minutes away from her mom.  CSW inquired about the status of the tub modifications to which she is uncertain.  Pt's daughter will plan to call CSW back with update once she is able to ask her mom and/or visit the home and see. "Mother is very hard of hearing".  CSW will await her update-  Reece Levy, MSW, LCSW Clinical Social Worker  [facility] [phone]

## 2019-12-14 ENCOUNTER — Ambulatory Visit: Payer: Medicare Other | Attending: Internal Medicine

## 2019-12-14 DIAGNOSIS — Z23 Encounter for immunization: Secondary | ICD-10-CM | POA: Insufficient documentation

## 2019-12-14 NOTE — Progress Notes (Signed)
   Covid-19 Vaccination Clinic  Name:  Claudia [name]    MRN: [id number] DOB: 09/20/1931  12/14/2019  [name] was observed post Covid-19 immunization for 15 minutes without incident. She was provided with Vaccine Information Sheet and instruction to access the V-Safe system.   [name] was instructed to call 911 with any severe reactions post vaccine: Marland Kitchen Difficulty breathing  . Swelling of face and throat  . A fast heartbeat  . A bad rash all over body  . Dizziness and weakness   Immunizations Administered    Name Date Dose VIS Date Route   Pfizer COVID-19 Vaccine 12/14/2019  8:26 AM 0.3 mL 09/19/2019 Intramuscular   Manufacturer: [employer], [employer]   Lot: [id number]   NDC: [address]-9

## 2019-12-19 ENCOUNTER — Other Ambulatory Visit: Payer: Self-pay | Admitting: *Deleted

## 2019-12-19 ENCOUNTER — Ambulatory Visit: Payer: Self-pay | Admitting: *Deleted

## 2019-12-19 NOTE — Patient Outreach (Signed)
[facility]) Care Management  12/19/2019  Sultana Tierney Stranahan [birth year] [id number]   CSW attempted to reach pt/family as well as Independent Living agency without success. CSW left messages and will await call back if no return call is received in 3- 4 business days.   Reece Levy, MSW, LCSW Clinical Social Worker  [facility] [phone]

## 2019-12-23 ENCOUNTER — Ambulatory Visit: Payer: Self-pay | Admitting: *Deleted

## 2020-02-05 ENCOUNTER — Telehealth: Payer: Self-pay | Admitting: Internal Medicine

## 2020-02-05 DIAGNOSIS — E785 Hyperlipidemia, unspecified: Secondary | ICD-10-CM

## 2020-02-05 NOTE — Progress Notes (Signed)
  Chronic Care Management   Note  02/05/2020 Name: REKA WIST MRN: [id number] DOB: May 08, 1931  Luetta Nutting Mendizabal is a 86 y.o. year old female who is a primary care patient of [name], MD. I reached out to Lilli Light by phone today in response to a referral sent by Ms. Luetta Nutting Conlee's PCP, [name], MD.   Ms. Shuping was given information about Chronic Care Management services today including:  1. CCM service includes personalized support from designated clinical staff supervised by her physician, including individualized plan of care and coordination with other care providers 2. 24/7 contact phone numbers for assistance for urgent and routine care needs. 3. Service will only be billed when office clinical staff spend 20 minutes or more in a month to coordinate care. 4. Only one practitioner may furnish and bill the service in a calendar month. 5. The patient may stop CCM services at any time (effective at the end of the month) by phone call to the office staff.   Patient agreed to services and verbal consent obtained.    This note is not being shared with the patient for the following reason: To respect privacy (The patient or proxy has requested that the information not be shared).  Follow up plan:   Raynicia Dukes [employer] Scheduler

## 2020-02-27 NOTE — Addendum Note (Signed)
Addended by: Radford Pax M on: 02/27/2020 01:59 PM   Modules accepted: Orders

## 2020-03-03 ENCOUNTER — Other Ambulatory Visit: Payer: Self-pay | Admitting: *Deleted

## 2020-03-03 NOTE — Patient Outreach (Signed)
[facility]) Care Management  03/03/2020  Claudia [name] June 06, 1931 [id number]   CSW made contact with pt's daughter and confirmed pt's identity.  CSW introduced self, role and reason for call. Per daughter, Claudia [name], her mother has a disabled daughter who resides with her.  Pt does not drive and has limited abilities to prepare food; thus family assists often.    Meals on Wheels ([employer]) arranged to begin 03/09/2020 for a two week period. She is also been placed on the on-going meal delivery wait list and will be contacted when a spot opens up.  CSW also inquired with M[employer]'s about pt's 41yo daughter who has Mental Retardation who could also use the meals.  CSW advised Claudia [name] of the steps to take for her sister.    CSW will plan a follow up call to confirm meals have begun and to further assess for needs.     Reece Levy, MSW, LCSW Clinical Social Worker  [facility] [phone]

## 2020-03-04 ENCOUNTER — Other Ambulatory Visit: Payer: Self-pay

## 2020-03-04 ENCOUNTER — Ambulatory Visit: Payer: Medicare Other | Admitting: Pharmacist

## 2020-03-04 DIAGNOSIS — I10 Essential (primary) hypertension: Secondary | ICD-10-CM

## 2020-03-04 DIAGNOSIS — F1190 Opioid use, unspecified, uncomplicated: Secondary | ICD-10-CM

## 2020-03-04 DIAGNOSIS — E785 Hyperlipidemia, unspecified: Secondary | ICD-10-CM

## 2020-03-04 NOTE — Patient Instructions (Addendum)
Visit Information  Goals Addressed            This Visit's Progress   . Pharmacy Care Plan       CARE PLAN ENTRY  Current Barriers:  . Chronic Disease Management support, education, and care coordination needs related to Hypertension and Hyperlipidemia   Hypertension BP Readings from Last 3 Encounters:  10/30/19 138/72  04/02/19 128/60  04/02/19 128/60 .  Pharmacist Clinical Goal(s): o Over the next 90 days, patient will work with PharmD and providers to maintain BP goal <140/90 . Current regimen:  o Triamterene-HCTZ 37.5-25 mg daily o Diltiazem 120 mg - 2 capsules daily o Clonidine 0.1 mg for BP > 180/100 . Interventions: o Discussed BP goals and benefits of medications for prevention of heart attack and stroke . Patient self care activities - Over the next 90 days, patient will: o Check BP 3 times a week, document, and provide at future appointments o Ensure daily salt intake < 2300 mg/day  Hyperlipidemia Lipid Panel     Component Value Date/Time   CHOL 174 10/30/2019 1616   TRIG 67.0 10/30/2019 1616   HDL 68.40 10/30/2019 1616   [place] 92 10/30/2019 1616 .  Pharmacist Clinical Goal(s): o Over the next 90 days, patient will work with PharmD and providers to maintain LDL goal < 100 . Current regimen:  o Simvastatin 20 mg daily at bedtime . Interventions: o Discussed cholesterol goal and benefits of simvastatin for prevention of heart attack and stroke . Patient self care activities - Over the next 90 days, patient will: o Continue medication as prescribed o Continue low cholesterol diet  Pain management . Pharmacist Clinical Goal(s) o Over the next 90 days, patient will work with PharmD and providers to optimize pain medication . Current regimen:  o Morphine ER 60 mg every 8 hours . Interventions: o Recommended to discuss medication issues openly with pain management provider - Patient interest in alternative agents, like pregabalin (Lyrica) - Patient  interest in coming off of opioid medication o Recommend daily "bowel regimen" to combat constipation - Docusate (Colace) 100 mg once daily - Miralax daily as needed . Patient self care activities - Over the next 90 days, patient will: o Continue medication as directed by pain management provider o Follow up with pain management regarding above issues o Begin bowel regimen as described above  Medication management . Pharmacist Clinical Goal(s): o Over the next 90 days, patient will work with PharmD and providers to maintain optimal medication adherence . Current pharmacy: [facility] . Interventions o Comprehensive medication review performed. o Continue current medication management strategy . Patient self care activities - Over the next 90 days, patient will: o Focus on medication adherence by patient report o Take medications as prescribed o Report any questions or concerns to PharmD and/or provider(s)  Initial goal documentation      The patient verbalized understanding of instructions provided today and agreed to receive a mailed copy of patient instruction and/or educational materials.  Telephone follow up appointment with pharmacy team member scheduled for: 3 months  Charlene Brooke, PharmD Clinical Pharmacist [facility] [phone]    How to Take Your Blood Pressure You can take your blood pressure at home with a machine. You may need to check your blood pressure at home:  To check if you have high blood pressure (hypertension).  To check your blood pressure over time.  To make sure your blood pressure medicine is working. Supplies  needed: You will need a blood pressure machine, or monitor. You can buy one at a drugstore or online. When choosing one:  Choose one with an arm cuff.  Choose one that wraps around your upper arm. Only one finger should fit between your arm and the cuff.  Do not choose one that measures your blood  pressure from your wrist or finger. Your doctor can suggest a monitor. How to prepare Avoid these things for 30 minutes before checking your blood pressure:  Drinking caffeine.  Drinking alcohol.  Eating.  Smoking.  Exercising. Five minutes before checking your blood pressure:  Pee.  Sit in a dining chair. Avoid sitting in a soft couch or armchair.  Be quiet. Do not talk. How to take your blood pressure Follow the instructions that came with your machine. If you have a digital blood pressure monitor, these may be the instructions: 1. Sit up straight. 2. Place your feet on the floor. Do not cross your ankles or legs. 3. Rest your left arm at the level of your heart. You may rest it on a table, desk, or chair. 4. Pull up your shirt sleeve. 5. Wrap the blood pressure cuff around the upper part of your left arm. The cuff should be 1 inch (2.5 cm) above your elbow. It is best to wrap the cuff around bare skin. 6. Fit the cuff snugly around your arm. You should be able to place only one finger between the cuff and your arm. 7. Put the cord inside the groove of your elbow. 8. Press the power button. 9. Sit quietly while the cuff fills with air and loses air. 10. Write down the numbers on the screen. 11. Wait 2-3 minutes and then repeat steps 1-10. What do the numbers mean? Two numbers make up your blood pressure. The first number is called systolic pressure. The second is called diastolic pressure. An example of a blood pressure reading is "120 over 80" (or 120/80). If you are an adult and do not have a medical condition, use this guide to find out if your blood pressure is normal: Normal  First number: below 120.  Second number: below 80. Elevated  First number: 120-129.  Second number: below 80. Hypertension stage 1  First number: 130-139.  Second number: 80-89. Hypertension stage 2  First number: 140 or above.  Second number: 90 or above. Your blood pressure is  above normal even if only the top or bottom number is above normal. Follow these instructions at home:  Check your blood pressure as often as your doctor tells you to.  Take your monitor to your next doctor's appointment. Your doctor will: ? Make sure you are using it correctly. ? Make sure it is working right.  Make sure you understand what your blood pressure numbers should be.  Tell your doctor if your medicines are causing side effects. Contact a doctor if:  Your blood pressure keeps being high. Get help right away if:  Your first blood pressure number is higher than 180.  Your second blood pressure number is higher than 120. This information is not intended to replace advice given to you by your health care provider. Make sure you discuss any questions you have with your health care provider. Document Revised: 09/07/2017 Document Reviewed: 03/03/2016 Elsevier Patient Education  2020 [employer].

## 2020-03-04 NOTE — Chronic Care Management (AMB) (Signed)
Chronic Care Management Pharmacy  Name: Claudia [name]  MRN: [id number] DOB: Jul 05, 1931  Chief Complaint/ HPI  Claudia [name] [place],  86 y.o. , female presents for their Initial CCM visit with the clinical pharmacist via telephone due to COVID-19 Pandemic.  PCP : [name], MD  Their chronic conditions include: Hypertension, Hyperlipidemia and back pain, insomnia  Palliative care w/ [facility]. Daughter Claudia [name]   Office Visits: 10/30/19 Dr Okey Dupre OV: no complaints, labs stable, no med changes. Given flu vaccine.  Consult Visit: follows with pain management   Allergies  Allergen Reactions  . Amitiza [Lubiprostone] Nausea And Vomiting  . Benadryl [Diphenhydramine] Other (See Comments)    Makes me hyper  . Ultram [Tramadol]     Made sick to stomach  . Aspirin Other (See Comments)    Upsets stomach, and nervousness. "I feel like I'm going to scream."  . Ibuprofen Other (See Comments)    Jittery, nervousness, similar to aspirin  . Sulfa Antibiotics Nausea And Vomiting and Rash   Medications: Outpatient Encounter Medications as of 03/04/2020  Medication Sig Note  . diltiazem (CARDIZEM CD) 120 MG 24 hr capsule Take 2 capsules (240 mg total) by mouth daily.   Marland Kitchen morphine (MS CONTIN) 60 MG 12 hr tablet Take 60 mg by mouth every 12 (twelve) hours.   . pantoprazole (PROTONIX) 40 MG tablet Take 1 tablet (40 mg total) by mouth daily.   . polyethylene glycol powder (GLYCOLAX/MIRALAX) powder TAKE 17 GRAMS TWICE A DAY AS NEEDED   . simvastatin (ZOCOR) 20 MG tablet TAKE 1 TABLET IN THE P.M.   . traZODone (DESYREL) 50 MG tablet Take 1-2 tablets (50-100 mg total) by mouth at bedtime as needed for sleep.   Marland Kitchen triamterene-hydrochlorothiazide (MAXZIDE-25) 37.5-25 MG tablet TAKE 1 TABLET EACH DAY.   . cloNIDine (CATAPRES) 0.1 MG tablet TAKE 1 TABLET DAILY FOR BLOOD PRESSURE>180 OR >100. (Patient not taking: Reported on 03/04/2020)   . docusate sodium 100 MG CAPS Take 100 mg by mouth daily. (Patient  not taking: Reported on 03/04/2020)   . nitroGLYCERIN (NITROSTAT) 0.4 MG SL tablet Place 0.4 mg under the tongue every 5 (five) minutes as needed for chest pain. 08/20/2015: Has not needed  . pregabalin (LYRICA) 75 MG capsule     No facility-administered encounter medications on file as of 03/04/2020.     Current Diagnosis/Assessment:  SDOH Interventions     Most Recent Value  SDOH Interventions  Financial Strain Interventions  Intervention Not Indicated     Goals Addressed            This Visit's Progress   . Pharmacy Care Plan       CARE PLAN ENTRY  Current Barriers:  . Chronic Disease Management support, education, and care coordination needs related to Hypertension and Hyperlipidemia   Hypertension BP Readings from Last 3 Encounters:  10/30/19 138/72  04/02/19 128/60  04/02/19 128/60 .  Pharmacist Clinical Goal(s): o Over the next 90 days, patient will work with PharmD and providers to maintain BP goal <140/90 . Current regimen:  o Triamterene-HCTZ 37.5-25 mg daily o Diltiazem 120 mg - 2 capsules daily o Clonidine 0.1 mg for BP > 180/100 . Interventions: o Discussed BP goals and benefits of medications for prevention of heart attack and stroke . Patient self care activities - Over the next 90 days, patient will: o Check BP 3 times a week, document, and provide at future appointments o Ensure daily salt intake < 2300 mg/day  Hyperlipidemia  Lipid Panel     Component Value Date/Time   CHOL 174 10/30/2019 1616   TRIG 67.0 10/30/2019 1616   HDL 68.40 10/30/2019 1616   LDLCALC 92 10/30/2019 1616 .  Pharmacist Clinical Goal(s): o Over the next 90 days, patient will work with PharmD and providers to maintain LDL goal < 100 . Current regimen:  o Simvastatin 20 mg daily at bedtime . Interventions: o Discussed cholesterol goal and benefits of simvastatin for prevention of heart attack and stroke . Patient self care activities - Over the next 90 days, patient  will: o Continue medication as prescribed o Continue low cholesterol diet  Pain management . Pharmacist Clinical Goal(s) o Over the next 90 days, patient will work with PharmD and providers to optimize pain medication . Current regimen:  o Morphine ER 60 mg every 8 hours . Interventions: o Recommended to discuss medication issues openly with pain management provider - Patient interest in alternative agents, like pregabalin (Lyrica) - Patient interest in coming off of opioid medication o Recommend daily "bowel regimen" to combat constipation - Docusate (Colace) 100 mg once daily - Miralax daily as needed . Patient self care activities - Over the next 90 days, patient will: o Continue medication as directed by pain management provider o Follow up with pain management regarding above issues o Begin bowel regimen as described above  Medication management . Pharmacist Clinical Goal(s): o Over the next 90 days, patient will work with PharmD and providers to maintain optimal medication adherence . Current pharmacy: [facility] . Interventions o Comprehensive medication review performed. o Continue current medication management strategy . Patient self care activities - Over the next 90 days, patient will: o Focus on medication adherence by patient report o Take medications as prescribed o Report any questions or concerns to PharmD and/or provider(s)  Initial goal documentation       Hypertension   BP goal is:  <140/90  Office blood pressures are  BP Readings from Last 3 Encounters:  10/30/19 138/72  04/02/19 128/60  04/02/19 128/60   Patient checks BP at home never - does not own cuff Patient home BP readings are ranging: n/a  Patient has failed these meds in the past: n/a Patient is currently controlled on the following medications:  . Triamterene-HCTZ 37.5-25 mg daily . Diltiazem 120 mg - 2 capsules daily . Clonidine 0.1 mg for BP > 180/100 - has not needed  We  discussed diet and exercise extensively; pt complaining of occasional dizziness/lightheadedness. Discussed benefits of monitoring BP at home to check for low BP that may cause dizziness.  Plan  Continue current medications and control with diet and exercise  Recommend monitoring BP at home to watch for low BP  Hyperlipidemia   Lipid Panel     Component Value Date/Time   CHOL 174 10/30/2019 1616   TRIG 67.0 10/30/2019 1616   HDL 68.40 10/30/2019 1616   LDLCALC 92 10/30/2019 1616     The ASCVD Risk score (Goff DC Jr., et al., 2013) failed to calculate for the following reasons:   The 2013 ASCVD risk score is only valid for ages 38 to 26   Patient has failed these meds in past: n/a Patient is currently controlled on the following medications:  . Simvastatin 20 mg daily . Nitroglycerin 0.4 mg SL prn  We discussed:  diet and exercise extensively; cholesterol goals; statin benefits including ASCVD risk reduction  Plan  Continue current medications and control with diet and exercise  Back pain   Patient has failed these meds in past: Pregabalin, Fentanyl patch Patient is currently uncontrolled on the following medications:  Marland Kitchen Morphine ER 60 mg q8h  We discussed:  Pt reports she does not have a good rapport with her pain mgmt provider, she reports MD accused her of "hoarding pills". Pt endorses compliance with morphine 3 times daily and she fills on time each month per PMP.  Pt is interested in coming off opioids, she reports she did well with Lyrica previously at a higher dose than 75 mg. Encouraged patient to have an open and honest discussion with her pain mgmt provider about her concerns.  Plan  Continue current medications  F/U with pain management  Insomnia   Patient has failed these meds in past: n/a Patient is currently controlled on the following medications:  . Trazodone 50 mg 1-2 tablets HS prn  We discussed:  Pt does not endorse issues with sleep  currently  Plan  Continue current medications  GERD   Patient has failed these meds in past: n/a Patient is currently uncontrolled on the following medications:  . Pantoprazole 40 mg daily  We discussed:  Pt reports she still has heartburn even with daily PPI. Recommended Tums  Plan  Continue current medications  Recommend Tums for breakthrough heartburn  Constipation   Patient has failed these meds in past: n/a Patient is currently uncontrolled on the following medications:  Marland Kitchen Miralax . Docusate 100 mg daily - not taking  We discussed:  Pt reports she is having constipation, she uses Miralax 1-2 times per week. Discussed opioid-induced constipation with morphine. Recommended stool softener daily and emphasized adequate hydration  Plan  Continue current medications  Recommended docusate 100 mg daily  Osteopenia / Osteoporosis   Last DEXA Scan: 01/20/2016   T-Score femoral neck: -2.8  T-Score total hip: -2.3  T-Score lumbar spine: -3.1  10-year probability of major osteoporotic fracture: n/a  10-year probability of hip fracture: n/a  No results found for: VD25OH   Patient is a candidate for pharmacologic treatment due to T-Score < -2.5 in femoral neck and T-Score < -2.5 in lumbar spine  Patient has failed these meds in past: alendronate (unknown dates) Patient is currently uncontrolled on the following medications:   No medications  We discussed:  Benefits of calcium and vitamin d supplementation  Plan  Recommend [phone] units of vitamin D daily.  Recommend 1200 mg of calcium daily from dietary and supplemental sources.  Health Maintenance   Patient is currently controlled on the following medications:  . Xiidra eye drops  We discussed:  Pt is satisfied with eye drops, improved dry eye.  Plan  Continue current medications  Medication Management   Pt uses [facility] pharmacy for all medications Uses pill box? No - keeps in bottles Pt endorses 100%  compliance  We discussed: Pt has medications delivered by [facility] and medications are synced. She is able to get refills on time and has never had to go without medications  Plan  Continue current medication management strategy    Follow up: 3 month phone visit  Al Corpus, PharmD Clinical Pharmacist [facility] [phone]

## 2020-03-04 NOTE — Chronic Care Management (AMB) (Deleted)
Chronic Care Management Pharmacy  Name: Claudia [name]  MRN: [id number] DOB: [birth year]  Chief Complaint/ HPI  Claudia [name],  86 y.o. , female presents for their Initial CCM visit with the clinical pharmacist via telephone due to COVID-19 Pandemic.  PCP : Claudia Broker, MD  Their chronic conditions include: Hypertension, Hyperlipidemia and back pain, insomnia  Palliative care w/ [facility]. Daughter Claudia [name]  Office Visits: 10/30/19 Dr Okey Dupre OV: no complaints, labs stable, no med changes. Given flu vaccine.  Consult Visit: n/a   Allergies  Allergen Reactions  . Amitiza [Lubiprostone] Nausea And Vomiting  . Benadryl [Diphenhydramine] Other (See Comments)    Makes me hyper  . Ultram [Tramadol]     Made sick to stomach  . Aspirin Other (See Comments)    Upsets stomach, and nervousness. "I feel like I'm going to scream."  . Ibuprofen Other (See Comments)    Jittery, nervousness, similar to aspirin  . Sulfa Antibiotics Nausea And Vomiting and Rash   Medications: Outpatient Encounter Medications as of 03/04/2020  Medication Sig Note  . cloNIDine (CATAPRES) 0.1 MG tablet TAKE 1 TABLET DAILY FOR BLOOD PRESSURE>180 OR >100.   . diltiazem (CARDIZEM CD) 120 MG 24 hr capsule Take 2 capsules (240 mg total) by mouth daily.   Marland Kitchen docusate sodium 100 MG CAPS Take 100 mg by mouth daily.   Marland Kitchen morphine (MS CONTIN) 60 MG 12 hr tablet Take 60 mg by mouth every 12 (twelve) hours.   . nitroGLYCERIN (NITROSTAT) 0.4 MG SL tablet Place 0.4 mg under the tongue every 5 (five) minutes as needed for chest pain. 08/20/2015: Has not needed  . pantoprazole (PROTONIX) 40 MG tablet Take 1 tablet (40 mg total) by mouth daily.   . polyethylene glycol powder (GLYCOLAX/MIRALAX) powder TAKE 17 GRAMS TWICE A DAY AS NEEDED   . pregabalin (LYRICA) 75 MG capsule    . simvastatin (ZOCOR) 20 MG tablet TAKE 1 TABLET IN THE P.M.   . traZODone (DESYREL) 50 MG tablet Take 1-2 tablets (50-100 mg total) by mouth at bedtime  as needed for sleep.   Marland Kitchen triamterene-hydrochlorothiazide (MAXZIDE-25) 37.5-25 MG tablet TAKE 1 TABLET EACH DAY.    No facility-administered encounter medications on file as of 03/04/2020.     Current Diagnosis/Assessment:  Goals Addressed   None     Hypertension   BP goal is:  <140/90  Office blood pressures are  BP Readings from Last 3 Encounters:  10/30/19 138/72  04/02/19 128/60  04/02/19 128/60   Patient checks BP at home {CHL HP BP Monitoring [phone]} Patient home BP readings are ranging: ***  Patient has failed these meds in the past: *** Patient is currently {CHL Controlled/[phone]} on the following medications:  . Triamterene-HCTZ 37.5-25 mg daily . Diltiazem 120 mg - 2 capsules daily . Clonidine 0.1 mg for BP > 180/100  We discussed {CHL HP Upstream Pharmacy discussion:[id number]}  Plan  Continue {CHL HP Upstream Pharmacy [phone]}     Hyperlipidemia   Lipid Panel     Component Value Date/Time   CHOL 174 10/30/2019 1616   TRIG 67.0 10/30/2019 1616   HDL 68.40 10/30/2019 1616   LDLCALC 92 10/30/2019 1616     The ASCVD Risk score (Goff DC Jr., et al., 2013) failed to calculate for the following reasons:   The 2013 ASCVD risk score is only valid for ages 43 to 69   Patient has failed these meds in past: *** Patient is currently {CHL Controlled/[phone]} on the  following medications:  . Simvastatin 20 mg daily . Nitroglycerin 0.4 mg SL prn  We discussed:  {CHL HP Upstream Pharmacy discussion:[phone]}  Plan  Continue {CHL HP Upstream Pharmacy [phone]}   Back pain   Patient has failed these meds in past: *** Patient is currently {CHL Controlled/Uncontrolled:[phone]} on the following medications:  Marland Kitchen Morphine ER 60 mg q12h . Fentanyl 100 mcg/hr patch q3 days . Pregabalin 75 mg   We discussed:  ***  Plan  Continue {CHL HP Upstream Pharmacy [phone]}  Insomnia    Patient has failed these meds in past: *** Patient is currently {CHL Controlled/Uncontrolled:[phone]} on the following medications:  . Trazodone 50 mg 1-2 tablets HS prn  We discussed:  ***  Plan  Continue {CHL HP Upstream Pharmacy [phone]}  GERD   Patient has failed these meds in past: *** Patient is currently {CHL Controlled/Uncontrolled:[phone]} on the following medications:  . Pantoprazole 40 mg daily  We discussed:  ***  Plan  Continue {CHL HP Upstream Pharmacy [phone]}  Constipation   Patient has failed these meds in past: *** Patient is currently {CHL Controlled/Uncontrolled:[phone]} on the following medications:  Marland Kitchen Miralax . Docusate 100 mg daily  We discussed:  ***  Plan  Continue {CHL HP Upstream Pharmacy [phone]}  Osteopenia / Osteoporosis   Last DEXA Scan: 01/20/2016   T-Score femoral neck: -2.8  T-Score total hip: -2.3  T-Score lumbar spine: -3.1  10-year probability of major osteoporotic fracture: n/a  10-year probability of hip fracture: n/a  No results found for: VD25OH   Patient is a candidate for pharmacologic treatment due to T-Score < -2.5 in femoral neck and T-Score < -2.5 in lumbar spine  Patient has failed these meds in past: alendronate (unknown dates) Patient is currently {CHL Controlled/Uncontrolled:[phone]} on the following medications:   ***  We discussed:  {Osteoporosis Counseling:23892}  Plan  Continue {CHL HP Upstream Pharmacy [phone]}  Health Maintenance   Patient is currently {CHL Controlled/Uncontrolled:[phone]} on the following medications:  . Shirley Friar eye drops  We discussed:  ***  Plan  Continue {CHL HP Upstream Pharmacy HCWCB:[id number]}  Medication Management   Pt uses [facility] for all medications Uses pill box? {Yes or If no, why not?:20788} Pt endorses ***% compliance  We discussed: ***  Plan  {US Pharmacy HYWV:37106}    Follow up:  *** month phone visit  ***

## 2020-03-08 ENCOUNTER — Emergency Department (HOSPITAL_COMMUNITY): Payer: Medicare Other

## 2020-03-08 ENCOUNTER — Other Ambulatory Visit: Payer: Self-pay

## 2020-03-08 ENCOUNTER — Emergency Department (HOSPITAL_COMMUNITY)
Admission: EM | Admit: 2020-03-08 | Discharge: 2020-03-08 | Disposition: A | Discharge status: Home / Self Care | Payer: Medicare Other | Attending: Emergency Medicine | Admitting: Emergency Medicine

## 2020-03-08 ENCOUNTER — Encounter (HOSPITAL_COMMUNITY): Payer: Self-pay | Admitting: *Deleted

## 2020-03-08 DIAGNOSIS — Y998 Other external cause status: Secondary | ICD-10-CM | POA: Insufficient documentation

## 2020-03-08 DIAGNOSIS — W19XXXA Unspecified fall, initial encounter: Secondary | ICD-10-CM | POA: Insufficient documentation

## 2020-03-08 DIAGNOSIS — I509 Heart failure, unspecified: Secondary | ICD-10-CM | POA: Diagnosis not present

## 2020-03-08 DIAGNOSIS — I2510 Atherosclerotic heart disease of native coronary artery without angina pectoris: Secondary | ICD-10-CM | POA: Diagnosis not present

## 2020-03-08 DIAGNOSIS — R519 Headache, unspecified: Secondary | ICD-10-CM | POA: Insufficient documentation

## 2020-03-08 DIAGNOSIS — S300XXA Contusion of lower back and pelvis, initial encounter: Secondary | ICD-10-CM | POA: Diagnosis not present

## 2020-03-08 DIAGNOSIS — I110 Hypertensive heart disease with heart failure: Secondary | ICD-10-CM | POA: Diagnosis not present

## 2020-03-08 DIAGNOSIS — S3992XA Unspecified injury of lower back, initial encounter: Secondary | ICD-10-CM | POA: Diagnosis present

## 2020-03-08 DIAGNOSIS — Y92010 Kitchen of single-family (private) house as the place of occurrence of the external cause: Secondary | ICD-10-CM | POA: Insufficient documentation

## 2020-03-08 DIAGNOSIS — Z79899 Other long term (current) drug therapy: Secondary | ICD-10-CM | POA: Diagnosis not present

## 2020-03-08 DIAGNOSIS — Y9389 Activity, other specified: Secondary | ICD-10-CM | POA: Diagnosis not present

## 2020-03-08 LAB — BASIC METABOLIC PANEL
Anion gap: 15 (ref 5–15)
BUN: 14 mg/dL (ref 8–23)
CO2: 23 mmol/L (ref 22–32)
Calcium: 9 mg/dL (ref 8.9–10.3)
Chloride: 102 mmol/L (ref 98–111)
Creatinine, Ser: 1.01 mg/dL — ABNORMAL HIGH (ref 0.44–1.00)
GFR calc Af Amer: 58 mL/min — ABNORMAL LOW (ref >60)
GFR calc non Af Amer: 50 mL/min — ABNORMAL LOW (ref >60)
Glucose, Bld: 130 mg/dL — ABNORMAL HIGH (ref 70–99)
Potassium: 5.5 mmol/L — ABNORMAL HIGH (ref 3.5–5.1)
Sodium: 140 mmol/L (ref 135–145)

## 2020-03-08 LAB — CBC WITH DIFFERENTIAL/PLATELET
Abs Immature Granulocytes: 0.07 10*3/uL (ref 0.00–0.07)
Basophils Absolute: 0 10*3/uL (ref 0.0–0.1)
Basophils Relative: 0 %
Eosinophils Absolute: 0 10*3/uL (ref 0.0–0.5)
Eosinophils Relative: 0 %
HCT: 42 % (ref 36.0–46.0)
Hemoglobin: 13.7 g/dL (ref 12.0–15.0)
Immature Granulocytes: 1 %
Lymphocytes Relative: 10 %
Lymphs Abs: 1 10*3/uL (ref 0.7–4.0)
MCH: 28.7 pg (ref 26.0–34.0)
MCHC: 32.6 g/dL (ref 30.0–36.0)
MCV: 88.1 fL (ref 80.0–100.0)
Monocytes Absolute: 0.8 10*3/uL (ref 0.1–1.0)
Monocytes Relative: 8 %
Neutro Abs: 7.7 10*3/uL (ref 1.7–7.7)
Neutrophils Relative %: 81 %
Platelets: 280 10*3/uL (ref 150–400)
RBC: 4.77 MIL/uL (ref 3.87–5.11)
RDW: 13.2 % (ref 11.5–15.5)
WBC: 9.5 10*3/uL (ref 4.0–10.5)
nRBC: 0 % (ref 0.0–0.2)

## 2020-03-08 MED ORDER — OXYCODONE-ACETAMINOPHEN 5-325 MG PO TABS
2.0000 | ORAL_TABLET | Freq: Once | ORAL | Status: AC
  Administered 2020-03-08: 2 via ORAL
  Filled 2020-03-08: qty 2

## 2020-03-08 MED ORDER — FENTANYL CITRATE (PF) 100 MCG/2ML IJ SOLN
50.0000 ug | Freq: Once | INTRAMUSCULAR | Status: AC
  Administered 2020-03-08: 50 ug via INTRAVENOUS
  Filled 2020-03-08: qty 2

## 2020-03-08 MED ORDER — OXYCODONE-ACETAMINOPHEN 5-325 MG PO TABS: 1.0000 | ORAL_TABLET | Freq: Four times a day (QID) | ORAL | 0 refills | Status: DC | PRN

## 2020-03-08 NOTE — ED Triage Notes (Signed)
Pt BIB EMS and presents after a fall at home.  Pt was standing in her kitchen at the sink.  Pt was turning and fell onto her tailbone.  Pt didn't lose consciousness or hit her head. Pt was able to get herself up to inform daughter to call 911.  Pt reports bilateral lower back pain.  Pt describes pain as "my kidney exploded." No obvious injuries. No blood thinners. Pt a/o x 4.

## 2020-03-08 NOTE — Discharge Instructions (Signed)
Take Percocet as prescribed as needed for pain.  Rest.  Follow-up with your primary doctor if symptoms or not improving in the next week, and return to the ER if symptoms significantly worsen or change.

## 2020-03-08 NOTE — ED Provider Notes (Signed)
[facility]-EMERGENCY DEPT Provider Note   CSN: [id number] Arrival date & time: 03/08/20  1744     History Chief Complaint  Patient presents with  . Fall    CANESHA TESFAYE is a 86 y.o. female.  Patient is an 86 year old female with history of coronary artery disease, hypertension, hyperlipidemia, chronic back pain.  Patient presents today with complaints of fall.  She was standing at the counter in the kitchen when she turned and fell backward landing on her buttock.  She describes severe pain in her low back with no radiation into her legs or bowel or bladder complaints.  She denies any numbness or tingling.  She had to crawl to the living room to call her daughter for help.  The history is provided by the patient.  Fall This is a new problem. The current episode started less than 1 hour ago. The problem occurs constantly. The problem has not changed since onset.Pertinent negatives include no chest pain and no headaches. The symptoms are aggravated by bending. Nothing relieves the symptoms. She has tried nothing for the symptoms.       Past Medical History:  Diagnosis Date  . Back pain   . Cataracts, both eyes   . CHF (congestive heart failure) (HCC)   . Chronic narcotic use   . Chronic pain   . Coronary artery disease   . Hyperlipidemia   . Hypertension   . Mitral valve prolapse   . Osteoarthritis   . Osteoporosis   . Pain management   . Reflux     Patient Active Problem List   Diagnosis Date Noted  . Hyperlipidemia 12/20/2017  . Routine general medical examination at a health care facility 12/19/2016  . Insomnia 06/19/2015  . Chronic narcotic use 12/13/2013  . Chronic back pain 12/13/2013  . Hypertension     Past Surgical History:  Procedure Laterality Date  . ABDOMINAL HYSTERECTOMY    . APPENDECTOMY    . BLADDER SURGERY    . BREAST SURGERY    . SHOULDER SURGERY     Right     OB History   No obstetric history on file.      Family History  Problem Relation Age of Onset  . Hypertension Mother   . Diabetes Mother   . Heart disease Father   . Hypertension Father   . Diabetes Father     Social History   Tobacco Use  . Smoking status: Never Smoker  . Smokeless tobacco: Never Used  Substance Use Topics  . Alcohol use: No    Alcohol/week: 0.0 standard drinks  . Drug use: No    Home Medications Prior to Admission medications   Medication Sig Start Date End Date Taking? Authorizing Provider  cloNIDine (CATAPRES) 0.1 MG tablet TAKE 1 TABLET DAILY FOR BLOOD PRESSURE>180 OR >100. Patient not taking: Reported on 03/04/2020 07/05/18   [name], MD  diltiazem (CARDIZEM CD) 120 MG 24 hr capsule Take 2 capsules (240 mg total) by mouth daily. 04/02/19   [name], MD  docusate sodium 100 MG CAPS Take 100 mg by mouth daily. Patient not taking: Reported on 03/04/2020 12/15/13   Renford Dills, MD  morphine (MS CONTIN) 60 MG 12 hr tablet Take 60 mg by mouth every 12 (twelve) hours.    [provider]  nitroGLYCERIN (NITROSTAT) 0.4 MG SL tablet Place 0.4 mg under the tongue every 5 (five) minutes as needed for chest pain.    [provider]  pantoprazole (PROTONIX) 40 MG tablet Take 1 tablet (40 mg total) by mouth daily. 04/02/19   [name], MD  polyethylene glycol powder [facility]) powder TAKE 17 GRAMS TWICE A DAY AS NEEDED 09/04/17   [name], MD  pregabalin (LYRICA) 75 MG capsule  07/13/18   [provider]  simvastatin (ZOCOR) 20 MG tablet TAKE 1 TABLET IN THE P.M. 04/02/19   [name], MD  traZODone (DESYREL) 50 MG tablet Take 1-2 tablets (50-100 mg total) by mouth at bedtime as needed for sleep. 04/02/19   [name], MD  triamterene-hydrochlorothiazide (MAXZIDE-25) 37.5-25 MG tablet TAKE 1 TABLET EACH DAY. 04/02/19   [name], MD    Allergies    Amitiza [lubiprostone], Benadryl [diphenhydramine],  Ultram [tramadol], Aspirin, Ibuprofen, and Sulfa antibiotics  Review of Systems   Review of Systems  Cardiovascular: Negative for chest pain.  Neurological: Negative for headaches.  All other systems reviewed and are negative.   Physical Exam Updated Vital Signs BP (!) 159/85 (BP Location: Right Arm)   Pulse 89   Temp 98.3 F (36.8 C) (Oral)   Resp 16   SpO2 99%   Physical Exam Vitals and nursing note reviewed.  Constitutional:      General: She is not in acute distress.    Appearance: She is well-developed. She is not diaphoretic.  HENT:     Head: Normocephalic and atraumatic.  Cardiovascular:     Rate and Rhythm: Normal rate and regular rhythm.     Heart sounds: No murmur. No friction rub. No gallop.   Pulmonary:     Effort: Pulmonary effort is normal. No respiratory distress.     Breath sounds: Normal breath sounds. No wheezing.  Abdominal:     General: Bowel sounds are normal. There is no distension.     Palpations: Abdomen is soft.     Tenderness: There is no abdominal tenderness.  Musculoskeletal:        General: Normal range of motion.     Cervical back: Normal range of motion and neck supple.     Comments: There is tenderness to palpation in the soft tissues of the lumbar region.  There is no bony tenderness or step-off.  Pelvis is stable.  She has good range of motion of the hips with no tenderness.  Skin:    General: Skin is warm and dry.  Neurological:     Mental Status: She is alert and oriented to person, place, and time.     Comments: Patient is intact throughout both lower extremities.  Pain is 5 out of 5 in both lower extremities.     ED Results / Procedures / Treatments   Labs (all labs ordered are listed, but only abnormal results are displayed) Labs Reviewed  BASIC METABOLIC PANEL  CBC WITH DIFFERENTIAL/PLATELET    EKG None  Radiology No results found.  Procedures Procedures (including critical care time)  Medications Ordered in  ED Medications  fentaNYL (SUBLIMAZE) injection 50 mcg (has no administration in time range)    ED Course  I have reviewed the triage vital signs and the nursing notes.  Pertinent labs & imaging results that were available during my care of the patient were reviewed by me and considered in my medical decision making (see chart for details).    MDM Rules/Calculators/A&P  Patient is an 86 year old female with history of chronic back issues presenting with complaints of fall.  Patient apparently lost her balance,  fell backward landing on her buttocks.  She has been having severe pain in her low back since.  She denies any radiation into her legs and strength, reflexes, and sensation are normal throughout both lower extremities.  She is having no bowel or bladder complaints and CT scan shows no evidence for compression fracture or misalignment.  Patient given fentanyl x2 with minimal relief.  She was then given 2 Percocet and now seems much more comfortable.  Disposition discussed with patient and daughter at bedside.  Patient much rather would go home.  As she seems more comfortable I feel as though this is appropriate.  She will be given pain medicine and is to follow-up as needed.  Final Clinical Impression(s) / ED Diagnoses Final diagnoses:  None    Rx / DC Orders ED Discharge Orders    None       Veryl Speak, MD 03/08/20 2247

## 2020-03-10 ENCOUNTER — Other Ambulatory Visit: Payer: Self-pay

## 2020-03-10 ENCOUNTER — Ambulatory Visit (INDEPENDENT_AMBULATORY_CARE_PROVIDER_SITE_OTHER): Payer: Medicare Other | Admitting: Internal Medicine

## 2020-03-10 ENCOUNTER — Encounter: Payer: Self-pay | Admitting: Internal Medicine

## 2020-03-10 VITALS — BP 124/72 | HR 74 | Temp 98.2°F | Ht 59.0 in | Wt 99.0 lb

## 2020-03-10 DIAGNOSIS — M545 Low back pain, unspecified: Secondary | ICD-10-CM

## 2020-03-10 DIAGNOSIS — F1190 Opioid use, unspecified, uncomplicated: Secondary | ICD-10-CM | POA: Diagnosis not present

## 2020-03-10 DIAGNOSIS — G8929 Other chronic pain: Secondary | ICD-10-CM | POA: Diagnosis not present

## 2020-03-10 DIAGNOSIS — E43 Unspecified severe protein-calorie malnutrition: Secondary | ICD-10-CM

## 2020-03-10 MED ORDER — METHYLPREDNISOLONE ACETATE 40 MG/ML IJ SUSP
40.0000 mg | Freq: Once | INTRAMUSCULAR | Status: AC
  Administered 2020-03-10: 40 mg via INTRAMUSCULAR

## 2020-03-10 MED ORDER — FENTANYL 100 MCG/HR TD PT72: 1.0000 | MEDICATED_PATCH | TRANSDERMAL | 0 refills | Status: DC

## 2020-03-10 MED ORDER — KETOROLAC TROMETHAMINE 30 MG/ML IJ SOLN
30.0000 mg | Freq: Once | INTRAMUSCULAR | Status: AC
Start: 2020-03-10 — End: 2020-03-10
  Administered 2020-03-10: 30 mg via INTRAMUSCULAR

## 2020-03-10 NOTE — Progress Notes (Signed)
Subjective:   Patient ID: Claudia [name], female    DOB: 1931/03/29, 86 y.o.   MRN: [id number]  HPI The patient is an 86 YO female coming in for ER follow up (with fall at home, scans of back and abdomen without acute findings, with worsening chronic back pain, given some pain medication in ER). She is not doing well since then. Denies recurrent falls. Having excruciating pain. She has also been having poor appetite and weight loss since her pain clinic has changed her fentanyl patch 100 mcg every other day to morphine 60 mg ER TID. This is causing her nausea and stomach problems. She has tried talking to the clinic but they will not listen to her. She has previously been on that same dosing for many years with good functional status. She is not sleeping well due to pain for several months. She is with her daughter today at visit and she has noticed the change in clumsiness with the change in the last few months and also the change in diet. She is using the percocet from the ER which takes the edge off the pain and the morphine.  Denies pain radiating to her legs, or weakness or numbness down the legs.  PMH, [facility], social history reviewed and updated  Review of Systems  Constitutional: Positive for activity change, appetite change and unexpected weight change.  HENT: Negative.   Eyes: Negative.   Respiratory: Negative for cough, chest tightness and shortness of breath.   Cardiovascular: Negative for chest pain, palpitations and leg swelling.  Gastrointestinal: Negative for abdominal distention, abdominal pain, constipation, diarrhea, nausea and vomiting.  Musculoskeletal: Positive for arthralgias, back pain, gait problem and myalgias.  Skin: Negative.   Neurological: Negative for dizziness, weakness, [name]-headedness and headaches.  Psychiatric/Behavioral: Positive for decreased concentration and sleep disturbance.    Objective:  Physical Exam Constitutional:      Appearance: She is  well-developed.     Comments: With temporal wasting, appears frail  HENT:     Head: Normocephalic and atraumatic.  Cardiovascular:     Rate and Rhythm: Normal rate and regular rhythm.  Pulmonary:     Effort: Pulmonary effort is normal. No respiratory distress.     Breath sounds: Normal breath sounds. No wheezing or rales.  Abdominal:     General: Bowel sounds are normal. There is no distension.     Palpations: Abdomen is soft.     Tenderness: There is no abdominal tenderness. There is no rebound.  Musculoskeletal:        General: Tenderness present.     Cervical back: Normal range of motion.     Comments: Pain lumbar diffuse including spinal and paraspinal  Skin:    General: Skin is warm and dry.  Neurological:     Mental Status: She is alert and oriented to person, place, and time.     Coordination: Coordination abnormal.     Comments: Walker for ambulation, slow to stand     Vitals:   03/10/20 0852  BP: 124/72  Pulse: 74  Temp: 98.2 F (36.8 C)  SpO2: 99%  Weight: 99 lb (44.9 kg)  Height: 4\' 11"  (1.499 m)    This visit occurred during the SARS-CoV-2 public health emergency.  Safety protocols were in place, including screening questions prior to the visit, additional usage of staff PPE, and extensive cleaning of exam room while observing appropriate contact time as indicated for disinfecting solutions.   Assessment & Plan:  Toradol 30 mg  IM and depo-medrol 40 mg IM given at visit

## 2020-03-10 NOTE — Patient Instructions (Signed)
We have given you two shots today to help with the pain and help shorten the amount of time you are having extra pain.  We have given you a prescription for the fentanyl patches to change every other day.   Come back in 1 month to make sure they are working well.

## 2020-03-11 DIAGNOSIS — E43 Unspecified severe protein-calorie malnutrition: Secondary | ICD-10-CM | POA: Insufficient documentation

## 2020-03-11 NOTE — Assessment & Plan Note (Signed)
Have agreed to take over fentanyl 100 mcg q 48 hours which she has been on safely for many years but which has been changed to morphine 60 mg ER TID by her pain clinic. This has caused significant decline in QOL and functional status as well as 10 pound weight loss (>10 % body weight loss). Will see her back in 1 month for pain assessment and if adequate will sign pain contract at that time.

## 2020-03-11 NOTE — Assessment & Plan Note (Signed)
This is acute on chronic given recent fall. Given toradol 30 mg IM and depo-medrol 40 mg IM at visit. Rx for her prior chronic fentanyl prescription 100 mcg q 48 hours which has controlled her pain for years prior to change. Will see her back in 1 month and if pain adequately controlled will sign pain contract.

## 2020-03-11 NOTE — Assessment & Plan Note (Signed)
Loss of 10% body weight in the last 4 months and significant change in appetite due to pain medication change. She is encouraged to eat more and try ensure to help with weight regain. She is having more problems with falls due to weight loss and poor oral intake.

## 2020-03-12 ENCOUNTER — Telehealth: Payer: Self-pay | Admitting: Internal Medicine

## 2020-03-12 ENCOUNTER — Other Ambulatory Visit: Payer: Self-pay | Admitting: *Deleted

## 2020-03-12 NOTE — Telephone Encounter (Signed)
    Pharmacy requesting prior auth for fentaNYL (DURAGESIC) 100 MCG/HR

## 2020-03-12 NOTE — Patient Outreach (Signed)
[facility]) Care Management  03/12/2020  Claudia [name] Jan 12, 1931 [id number]   CSW spoke with pt's daughter who reports the meal delivery has begun.  "I think she is eating the meals".  Discussed with daughter pt's fall and recent ER visit. Per daughter, they then had a follow up visit with PCP who has adjusted her pain meds- encouraged daughter to call Pharmacy asap to confirm they received the new RX and to call PCP office it they haven't so pt does not go all weekend without. CSW and daughter also discussed possible benefit of pt receiving HH visits for an RN and PT evaluation- along with possible bath aide and other services as needed.  Daughter agrees this would be helpful with her mom's recent fall, DME assessment and overall wellness/safety.  CSW will inquire with PCP about getting an order placed to [facility] agency to initiate this care.   CSW also discussed possible insurance benefits that they may not know she has that may be advantageous; for OTC catalog monies, vision, etc.  Daughter to assist with reviewing pt's info via insurance portal for members.   CSW offered emotional support and encouragement to daughter; as the primary family caregiver/support she is juggling a lot; along with pt's (at times)  Paranoia,controlling and stubbornness.  CSW validated her feelings and reflected on how the pt is likely also struggling as she sees herself declining and losing abilities and independence. CSW encouraged daughter to find a healthy balance and to focus on her self and self care as to not get burnt out.   Pt's daughter appreciative of support and assistance- reminded to call if needs arise before next outreach call.   Reece Levy, MSW, LCSW Clinical Social Worker  [facility] [phone]      Reece Levy, MSW, LCSW Clinical Social Worker  [facility] [phone]

## 2020-03-12 NOTE — Telephone Encounter (Signed)
Okay to do prior authorization. Patient can buy in meantime or wait until status of PA determined.

## 2020-03-16 NOTE — Telephone Encounter (Signed)
New message:   Pt's daughter is calling to check on the status of the pt's prior authorization. Please advise.

## 2020-03-16 NOTE — Telephone Encounter (Signed)
Key: [id number]

## 2020-03-16 NOTE — Addendum Note (Signed)
Addended by: Hillard Danker A on: 03/16/2020 09:14 AM   Modules accepted: Orders

## 2020-03-17 NOTE — Telephone Encounter (Signed)
    [facility] calling to advise Dr Okey Dupre fentaNYL (DURAGESIC) 100 MCG/HR approved, however Dr Celine Mans still has patient on morphine

## 2020-03-18 NOTE — Telephone Encounter (Signed)
I have discussed with patient and family. She is not going to see Dr. Celine Mans or take the morphine any longer.

## 2020-03-18 NOTE — Telephone Encounter (Signed)
Pharmacist has been informed

## 2020-03-23 ENCOUNTER — Emergency Department (HOSPITAL_COMMUNITY)
Admission: EM | Admit: 2020-03-23 | Discharge: 2020-03-23 | Disposition: A | Discharge status: Home / Self Care | Payer: Medicare Other | Attending: Emergency Medicine | Admitting: Emergency Medicine

## 2020-03-23 ENCOUNTER — Encounter (HOSPITAL_COMMUNITY): Payer: Self-pay | Admitting: Emergency Medicine

## 2020-03-23 DIAGNOSIS — R5383 Other fatigue: Secondary | ICD-10-CM | POA: Diagnosis not present

## 2020-03-23 DIAGNOSIS — I10 Essential (primary) hypertension: Secondary | ICD-10-CM | POA: Diagnosis not present

## 2020-03-23 DIAGNOSIS — R319 Hematuria, unspecified: Secondary | ICD-10-CM | POA: Diagnosis present

## 2020-03-23 LAB — CBC
HCT: 35.5 % — ABNORMAL LOW (ref 36.0–46.0)
Hemoglobin: 11.6 g/dL — ABNORMAL LOW (ref 12.0–15.0)
MCH: 29.2 pg (ref 26.0–34.0)
MCHC: 32.7 g/dL (ref 30.0–36.0)
MCV: 89.4 fL (ref 80.0–100.0)
Platelets: 271 10*3/uL (ref 150–400)
RBC: 3.97 MIL/uL (ref 3.87–5.11)
RDW: 13.3 % (ref 11.5–15.5)
WBC: 11.8 10*3/uL — ABNORMAL HIGH (ref 4.0–10.5)
nRBC: 0 % (ref 0.0–0.2)

## 2020-03-23 LAB — URINALYSIS, ROUTINE W REFLEX MICROSCOPIC
Bacteria, UA: NONE SEEN
Bilirubin Urine: NEGATIVE
Glucose, UA: NEGATIVE mg/dL
Ketones, ur: 5 mg/dL — AB
Leukocytes,Ua: NEGATIVE
Nitrite: NEGATIVE
Protein, ur: NEGATIVE mg/dL
Specific Gravity, Urine: 1.021 (ref 1.005–1.030)
pH: 6 (ref 5.0–8.0)

## 2020-03-23 LAB — BASIC METABOLIC PANEL
Anion gap: 11 (ref 5–15)
BUN: 18 mg/dL (ref 8–23)
CO2: 26 mmol/L (ref 22–32)
Calcium: 8.6 mg/dL — ABNORMAL LOW (ref 8.9–10.3)
Chloride: 101 mmol/L (ref 98–111)
Creatinine, Ser: 0.77 mg/dL (ref 0.44–1.00)
GFR calc Af Amer: >60 mL/min (ref >60)
GFR calc non Af Amer: >60 mL/min (ref >60)
Glucose, Bld: 143 mg/dL — ABNORMAL HIGH (ref 70–99)
Potassium: 3.5 mmol/L (ref 3.5–5.1)
Sodium: 138 mmol/L (ref 135–145)

## 2020-03-23 NOTE — ED Provider Notes (Signed)
[facility]-EMERGENCY DEPT Provider Note   CSN: [id number] Arrival date & time: 03/23/20  1113     History Chief Complaint  Patient presents with  . Hematuria    Claudia [name] is a 86 y.o. female.  HPI   89yF with generalized weakness. Feels like she has no appetite and no energy. She is not sure why. Worsening in past week. Lives at home with daughters. One has special needs and she is a caretaker. This has been more difficult. Occasional blood in urine but this has been ongoing problem. No dysuria. No v/d.   Past Medical History:  Diagnosis Date  . Back pain   . Cataracts, both eyes   . CHF (congestive heart failure) (HCC)   . Chronic narcotic use   . Chronic pain   . Coronary artery disease   . Hyperlipidemia   . Hypertension   . Mitral valve prolapse   . Osteoarthritis   . Osteoporosis   . Pain management   . Reflux     Patient Active Problem List   Diagnosis Date Noted  . Protein-calorie malnutrition, severe (HCC) 03/11/2020  . Hyperlipidemia 12/20/2017  . Routine general medical examination at a health care facility 12/19/2016  . Insomnia 06/19/2015  . Chronic narcotic use 12/13/2013  . Chronic back pain 12/13/2013  . Hypertension     Past Surgical History:  Procedure Laterality Date  . ABDOMINAL HYSTERECTOMY    . APPENDECTOMY    . BLADDER SURGERY    . BREAST SURGERY    . SHOULDER SURGERY     Right     OB History   No obstetric history on file.     Family History  Problem Relation Age of Onset  . Hypertension Mother   . Diabetes Mother   . Heart disease Father   . Hypertension Father   . Diabetes Father     Social History   Tobacco Use  . Smoking status: Never Smoker  . Smokeless tobacco: Never Used  Vaping Use  . Vaping Use: Never used  Substance Use Topics  . Alcohol use: No    Alcohol/week: 0.0 standard drinks  . Drug use: No    Home Medications Prior to Admission medications   Medication Sig Start  Date End Date Taking? Authorizing Provider  acetaminophen (TYLENOL) 500 MG tablet Take 1,000 mg by mouth every 6 (six) hours as needed for moderate pain.   Yes [provider]  diltiazem (CARDIZEM CD) 120 MG 24 hr capsule Take 2 capsules (240 mg total) by mouth daily. 04/02/19  Yes [name], MD  fentaNYL (DURAGESIC) 100 MCG/HR Place 1 patch onto the skin every other day. 03/10/20  Yes [name], MD  nitroGLYCERIN (NITROSTAT) 0.4 MG SL tablet Place 0.4 mg under the tongue every 5 (five) minutes as needed for chest pain.   Yes [provider]  pantoprazole (PROTONIX) 40 MG tablet Take 1 tablet (40 mg total) by mouth daily. 04/02/19  Yes [name], MD  polyethylene glycol powder (GLYCOLAX/MIRALAX) powder TAKE 17 GRAMS TWICE A DAY AS NEEDED Patient taking differently: Take 1 Container by mouth daily as needed for mild constipation.  09/04/17  Yes [name], MD  simvastatin (ZOCOR) 20 MG tablet TAKE 1 TABLET IN THE P.M. Patient taking differently: Take 20 mg by mouth daily.  04/02/19  Yes [name], MD  triamterene-hydrochlorothiazide (MAXZIDE-25) 37.5-25 MG tablet TAKE 1 TABLET EACH DAY. Patient taking differently: Take 1  tablet by mouth daily.  04/02/19  Yes Hoyt Koch, MD  XIIDRA 5 % SOLN Place 1 drop into both eyes in the morning and at bedtime.  02/05/20  Yes [provider]  BESIVANCE 0.6 % SUSP  03/16/20   [provider]  cloNIDine (CATAPRES) 0.1 MG tablet TAKE 1 TABLET DAILY FOR BLOOD PRESSURE>180 OR >100. Patient not taking: Reported on 03/10/2020 07/05/18   Hoyt Koch, MD  docusate sodium 100 MG CAPS Take 100 mg by mouth daily. Patient not taking: Reported on 03/23/2020 12/15/13   Seward Carol, MD  oxyCODONE-acetaminophen (PERCOCET) 5-325 MG tablet Take 1-2 tablets by mouth every 6 (six) hours as needed. Patient not taking: Reported on 03/23/2020 03/08/20   Veryl Speak, MD  PROLENSA  0.07 % SOLN  03/16/20   [provider]    Allergies    Amitiza [lubiprostone], Benadryl [diphenhydramine], Ultram [tramadol], Aspirin, Ibuprofen, and Sulfa antibiotics  Review of Systems   Review of Systems All systems reviewed and negative, other than as noted in HPI.  Physical Exam Updated Vital Signs BP 131/75   Pulse 86   Temp 99.2 F (37.3 C) (Oral)   Resp 16   SpO2 98%   Physical Exam Vitals and nursing note reviewed.  Constitutional:      General: She is not in acute distress.    Appearance: She is well-developed.  HENT:     Head: Normocephalic and atraumatic.  Eyes:     General:        Right eye: No discharge.        Left eye: No discharge.     Conjunctiva/sclera: Conjunctivae normal.  Cardiovascular:     Rate and Rhythm: Normal rate and regular rhythm.     Heart sounds: Normal heart sounds. No murmur heard.  No friction rub. No gallop.   Pulmonary:     Effort: Pulmonary effort is normal. No respiratory distress.     Breath sounds: Normal breath sounds.  Abdominal:     General: There is no distension.     Palpations: Abdomen is soft.     Tenderness: There is no abdominal tenderness.  Musculoskeletal:        General: No tenderness.     Cervical back: Neck supple.  Skin:    General: Skin is warm and dry.  Neurological:     General: No focal deficit present.     Mental Status: She is alert and oriented to person, place, and time.     Cranial Nerves: Cranial nerve deficit present.     Sensory: No sensory deficit.     Motor: No weakness.     Coordination: Coordination normal.  Psychiatric:        Behavior: Behavior normal.        Thought Content: Thought content normal.     ED Results / Procedures / Treatments   Labs (all labs ordered are listed, but only abnormal results are displayed) Labs Reviewed  URINALYSIS, ROUTINE W REFLEX MICROSCOPIC - Abnormal; Notable for the following components:      Result Value   Hgb urine dipstick SMALL (*)      Ketones, ur 5 (*)    All other components within normal limits  BASIC METABOLIC PANEL - Abnormal; Notable for the following components:   Glucose, Bld 143 (*)    Calcium 8.6 (*)    All other components within normal limits  CBC - Abnormal; Notable for the following components:   WBC 11.8 (*)  Hemoglobin 11.6 (*)    HCT 35.5 (*)    All other components within normal limits    EKG None  Radiology No results found.  Procedures Procedures (including critical care time)  Medications Ordered in ED Medications - No data to display  ED Course  I have reviewed the triage vital signs and the nursing notes.  Pertinent labs & imaging results that were available during my care of the patient were reviewed by me and considered in my medical decision making (see chart for details).    MDM Rules/Calculators/A&P                          89yF with generalized weakness. This has been an ongoing problem. Has already spoken with PCP. Apparently discussion about additional resources and PT at home. This has not been set up though and they express some frustration. Care management consulted and will look into it. ED w/u fairly unremarkable. Discussed with pt/daughter. Additional concerns about pain regimen. Explained that outside of purview of ED and needs to discuss with pain management or PCP.   Final Clinical Impression(s) / ED Diagnoses Final diagnoses:  Other fatigue    Rx / DC Orders ED Discharge Orders    None       Raeford Razor, MD 03/26/20 1221

## 2020-03-23 NOTE — ED Triage Notes (Signed)
Per pt, states poor PO intake and hematuria "for awhile now"-states no pain with urination, chronic pain issues

## 2020-03-23 NOTE — Progress Notes (Signed)
TOC CM spoke to pt and dtr, Virgina Norfolk at bedside. Offered choice for [facility]. Agreeable to [facility]. Referral sent to rep, [facility]. States she has wheelchair and oversized rollator at home that she purchased. Pt requesting a Rollator and bedside commode.  Orders were faxed to [facility] for DME. Isidoro Donning RN CCM, [facility] ED TOC CM [phone]

## 2020-03-23 NOTE — Care Management (Signed)
DME orders faxed to [facility].

## 2020-03-24 ENCOUNTER — Ambulatory Visit: Payer: Self-pay | Admitting: *Deleted

## 2020-03-31 ENCOUNTER — Other Ambulatory Visit: Payer: Self-pay | Admitting: *Deleted

## 2020-04-01 NOTE — Patient Outreach (Signed)
[facility]) Care Management  04/01/2020  Jalana Moore Blumenstein Oct 24, 1930 [id number]    CSW spoke with pt's daughter who reports her mom was able to get set up with [facility] (RN, PT, bath aide, SW) through [facility] and this is going well- "mom wants the bath aide to do more/housekeeping tasks for her".   CSW reminded daughter that her mom is on the wait list for meals on wheels and that the wait list is particularly long and slow right now. Also reminded her that her sister who lives with pt (mentally challenged daughter) may also be able to get the meals delivered- and to ask when they call about getting her mom assessed and set up.    CSW encouraged daughter to continue to work to find ways to balance her needs with the caregiving she does for mom.   CSW will sign off and will advise PCP and [facility] team of above.   Reece Levy, MSW, LCSW Clinical Social Worker  [facility] [phone]   CSW discussed plans to close CSW referral at this time and reminded daughter to call if needs or concerns arise.      Reece Levy, MSW, LCSW Clinical Social Worker  [facility] [phone]

## 2020-04-05 DIAGNOSIS — G4700 Insomnia, unspecified: Secondary | ICD-10-CM

## 2020-04-05 DIAGNOSIS — Z79899 Other long term (current) drug therapy: Secondary | ICD-10-CM

## 2020-04-05 DIAGNOSIS — E785 Hyperlipidemia, unspecified: Secondary | ICD-10-CM

## 2020-04-05 DIAGNOSIS — I10 Essential (primary) hypertension: Secondary | ICD-10-CM

## 2020-04-05 DIAGNOSIS — G8929 Other chronic pain: Secondary | ICD-10-CM

## 2020-04-05 DIAGNOSIS — E43 Unspecified severe protein-calorie malnutrition: Secondary | ICD-10-CM | POA: Diagnosis not present

## 2020-04-05 DIAGNOSIS — M545 Low back pain: Secondary | ICD-10-CM | POA: Diagnosis not present

## 2020-04-05 DIAGNOSIS — Z9181 History of falling: Secondary | ICD-10-CM

## 2020-04-05 DIAGNOSIS — R32 Unspecified urinary incontinence: Secondary | ICD-10-CM

## 2020-04-08 ENCOUNTER — Ambulatory Visit (INDEPENDENT_AMBULATORY_CARE_PROVIDER_SITE_OTHER): Payer: Medicare Other | Admitting: Internal Medicine

## 2020-04-08 ENCOUNTER — Encounter: Payer: Self-pay | Admitting: Internal Medicine

## 2020-04-08 ENCOUNTER — Other Ambulatory Visit: Payer: Self-pay

## 2020-04-08 VITALS — BP 132/82 | HR 69 | Temp 98.2°F | Ht 59.0 in | Wt 103.0 lb

## 2020-04-08 DIAGNOSIS — Z0289 Encounter for other administrative examinations: Secondary | ICD-10-CM | POA: Diagnosis not present

## 2020-04-08 DIAGNOSIS — G8929 Other chronic pain: Secondary | ICD-10-CM

## 2020-04-08 DIAGNOSIS — M545 Low back pain, unspecified: Secondary | ICD-10-CM

## 2020-04-08 DIAGNOSIS — F1120 Opioid dependence, uncomplicated: Secondary | ICD-10-CM | POA: Diagnosis not present

## 2020-04-08 MED ORDER — FENTANYL 100 MCG/HR TD PT72: 1.0000 | MEDICATED_PATCH | TRANSDERMAL | 0 refills | Status: DC

## 2020-04-08 NOTE — Assessment & Plan Note (Signed)
Signed pain contract today, pain is better controlled and more functional status with her usual fentanyl 100 mcg every other day #15 for 30 days. Refilled today. No side effects. [place] narcotic database reviewed and no inappropriate fills. Recent UDS with pain clinic due for repeat 2022.

## 2020-04-08 NOTE — Patient Instructions (Addendum)
We have signed the pain contract today and sent in the refill of the pain medication.   We will see you in the office every 3-6 months to make sure this is still doing well.

## 2020-04-08 NOTE — Assessment & Plan Note (Signed)
Stable pain for many years and for many years controlled on fentanyl 100 mcg every other patch patches. For some reason her pain clinic decided to change her to morphine ER TID which was not adequately controlling her pain. She did decide with our advice to move her pain management to [place] and we have agreed to prescribe at that dosing. Pain agreement signed today. [place] narcotic database reviewed and no inappropriate fills.

## 2020-04-08 NOTE — Assessment & Plan Note (Addendum)
Pain contract explained with patient and daughter and signed at office visit for Fentanyl 100 mcg every other day #15 per 30 days.  narcotic database reviewed at visit today without inappropriate filled. Last filled 03/18/20.

## 2020-04-08 NOTE — Progress Notes (Signed)
° °  Subjective:   Patient ID: Claudia [name], female    DOB: 1930/11/07, 86 y.o.   MRN: [id number]  HPI The patient is an 86 YO female coming in for pain management (has been back on her fentanyl patches for about 3 weeks now, pain is much better controlled, pain 10/10 without medication, 5-6/10 with medication, this allows her to be more functional at home, she is retired and has not worked in some years, not on disability, denies constipation or sedation with medication, denies side effects, denies recent falls) and chronic back pain (overall stable, better controlled now that she is back on her chronic pain medication fentanyl, she is able to be more functional and is needed to help care for her grand daughter who lives with her and has special needs, denies overuse or side effects) and narcotic dependence (chronically on fentanyl 100 mcg every other day, was changed by pain clinic to morphine instead, she did not do well on that regimen, they were unwilling to change this back, she was not functioning well and had several falls due to the change in pain, using wheeled walker and with better pain control is more stable).   Review of Systems  Constitutional: Positive for activity change and fatigue. Negative for appetite change and chills.  HENT: Negative.   Eyes: Negative.   Respiratory: Negative for cough, chest tightness and shortness of breath.   Cardiovascular: Negative for chest pain, palpitations and leg swelling.  Gastrointestinal: Negative for abdominal distention, abdominal pain, constipation, diarrhea, nausea and vomiting.  Musculoskeletal: Positive for arthralgias, back pain and gait problem.  Skin: Negative.   Neurological: Negative for dizziness, weakness, [name]-headedness, numbness and headaches.  Psychiatric/Behavioral: Negative.     Objective:  Physical Exam Constitutional:      Appearance: She is well-developed.  HENT:     Head: Normocephalic and atraumatic.    Cardiovascular:     Rate and Rhythm: Normal rate and regular rhythm.  Pulmonary:     Effort: Pulmonary effort is normal. No respiratory distress.     Breath sounds: Normal breath sounds. No wheezing or rales.  Abdominal:     General: Bowel sounds are normal. There is no distension.     Palpations: Abdomen is soft.     Tenderness: There is no abdominal tenderness. There is no rebound.  Musculoskeletal:        General: Tenderness present.     Cervical back: Normal range of motion.     Comments: Pain low back, with brace in place  Skin:    General: Skin is warm and dry.  Neurological:     Mental Status: She is alert and oriented to person, place, and time.     Coordination: Coordination abnormal.     Comments: Wheeled walker for ambulation, slow gait and slow to stand    Vitals:   04/08/20 0859  BP: 132/82  Pulse: 69  Temp: 98.2 F (36.8 C)  TempSrc: Oral  SpO2: 98%  Weight: 103 lb (46.7 kg)  Height: 4\' 11"  (1.499 m)   This visit occurred during the SARS-CoV-2 public health emergency.  Safety protocols were in place, including screening questions prior to the visit, additional usage of staff PPE, and extensive cleaning of exam room while observing appropriate contact time as indicated for disinfecting solutions.   Assessment & Plan:

## 2020-04-20 ENCOUNTER — Telehealth: Payer: Self-pay | Admitting: Internal Medicine

## 2020-04-20 NOTE — Telephone Encounter (Signed)
    [facility] requesting order [phone] be refaxed

## 2020-04-20 NOTE — Telephone Encounter (Signed)
Order has been refaxed.  

## 2020-05-01 ENCOUNTER — Other Ambulatory Visit: Payer: Self-pay | Admitting: Internal Medicine

## 2020-05-18 ENCOUNTER — Other Ambulatory Visit: Payer: Self-pay | Admitting: Internal Medicine

## 2020-05-18 NOTE — Telephone Encounter (Signed)
F/u    The patient is calling about her fentaNYL (DURAGESIC) 100 MCG/HR as to why it's not refilled.    Asking for a call back

## 2020-05-18 NOTE — Telephone Encounter (Signed)
04/16/2020 Fentanyl 100 Mcg/hr Patch 15#  Last OV  04/08/20 Next OV n/s

## 2020-06-15 ENCOUNTER — Telehealth: Payer: Self-pay | Admitting: Internal Medicine

## 2020-06-15 MED ORDER — FENTANYL 100 MCG/HR TD PT72: MEDICATED_PATCH | TRANSDERMAL | 0 refills | Status: DC

## 2020-06-15 NOTE — Telephone Encounter (Signed)
Done erx 

## 2020-06-15 NOTE — Addendum Note (Signed)
Addended by: Corwin Levins on: 06/15/2020 11:53 AM   Modules accepted: Orders

## 2020-06-15 NOTE — Telephone Encounter (Signed)
    1.Medication Requested:fentaNYL (DURAGESIC) 100 MCG/HR  2. Pharmacy (Name, Street, City):[facility] - [place], [place] - [address]  3. On Med List: YES  4. Last Visit with PCP: 04/08/20  5. Next visit date with PCP: N/A   Agent: Please be advised that RX refills may take up to 3 business days. We ask that you follow-up with your pharmacy.

## 2020-07-10 ENCOUNTER — Other Ambulatory Visit: Payer: Self-pay | Admitting: Internal Medicine

## 2020-07-10 NOTE — Telephone Encounter (Signed)
Done erx 

## 2020-07-26 ENCOUNTER — Encounter: Payer: Self-pay | Admitting: Family

## 2020-07-26 ENCOUNTER — Other Ambulatory Visit: Payer: Self-pay

## 2020-07-26 ENCOUNTER — Ambulatory Visit (INDEPENDENT_AMBULATORY_CARE_PROVIDER_SITE_OTHER): Payer: Medicare Other | Admitting: Family

## 2020-07-26 VITALS — BP 128/76 | HR 97 | Temp 98.5°F | Ht 59.0 in | Wt 104.0 lb

## 2020-07-26 DIAGNOSIS — Z23 Encounter for immunization: Secondary | ICD-10-CM | POA: Diagnosis not present

## 2020-07-26 DIAGNOSIS — F5101 Primary insomnia: Secondary | ICD-10-CM | POA: Diagnosis not present

## 2020-07-26 MED ORDER — LORAZEPAM 0.5 MG PO TABS
0.5000 mg | ORAL_TABLET | Freq: Every day | ORAL | 1 refills | Status: DC
Start: 2020-07-26 — End: 2020-08-02

## 2020-07-26 NOTE — Progress Notes (Signed)
Claudia [name] is a 86 y.o. female with the following history as recorded in [employer]:  Patient Active Problem List   Diagnosis Date Noted   Pain management contract signed 04/08/2020   Protein-calorie malnutrition, severe (HCC) 03/11/2020   Hyperlipidemia 12/20/2017   Routine general medical examination at a health care facility 12/19/2016   Insomnia 06/19/2015   Narcotic dependence (HCC) 12/13/2013   Chronic back pain 12/13/2013   Hypertension     Current Outpatient Medications  Medication Sig Dispense Refill   acetaminophen (TYLENOL) 500 MG tablet Take 1,000 mg by mouth every 6 (six) hours as needed for moderate pain.     BESIVANCE 0.6 % SUSP      cloNIDine (CATAPRES) 0.1 MG tablet TAKE 1 TABLET DAILY FOR BLOOD PRESSURE>180 OR >100. 30 tablet 5   diltiazem (CARDIZEM CD) 120 MG 24 hr capsule TAKE 2 CAPSULES DAILY. 180 capsule 1   docusate sodium 100 MG CAPS Take 100 mg by mouth daily. 10 capsule 0   fentaNYL (DURAGESIC) 100 MCG/HR APPLY 1 PATCH EVERY OTHER DAY AS DIRECTED 15 patch 0   pantoprazole (PROTONIX) 40 MG tablet TAKE 1 TABLET EACH DAY. 90 tablet 1   polyethylene glycol powder (GLYCOLAX/MIRALAX) powder TAKE 17 GRAMS TWICE A DAY AS NEEDED (Patient taking differently: Take 1 Container by mouth daily as needed for mild constipation. ) 527 g 1   PROLENSA 0.07 % SOLN      simvastatin (ZOCOR) 20 MG tablet TAKE 1 TABLET IN THE P.M. 90 tablet 1   triamterene-hydrochlorothiazide (MAXZIDE-25) 37.5-25 MG tablet TAKE 1 TABLET EACH DAY. 90 tablet 1   XIIDRA 5 % SOLN Place 1 drop into both eyes in the morning and at bedtime.      LORazepam (ATIVAN) 0.5 MG tablet Take 1 tablet (0.5 mg total) by mouth at bedtime. 30 tablet 1   No current facility-administered medications for this visit.    Allergies: Amitiza [lubiprostone], Benadryl [diphenhydramine], Ultram [tramadol], Aspirin, Ibuprofen, and Sulfa antibiotics  Past Medical History:  Diagnosis Date   Back pain     Cataracts, both eyes    CHF (congestive heart failure) (HCC)    Chronic narcotic use    Chronic pain    Coronary artery disease    Hyperlipidemia    Hypertension    Mitral valve prolapse    Osteoarthritis    Osteoporosis    Pain management    Reflux     Past Surgical History:  Procedure Laterality Date   ABDOMINAL HYSTERECTOMY     APPENDECTOMY     BLADDER SURGERY     BREAST SURGERY     SHOULDER SURGERY     Right    Family History  Problem Relation Age of Onset   Hypertension Mother    Diabetes Mother    Heart disease Father    Hypertension Father    Diabetes Father     Social History   Tobacco Use   Smoking status: Never Smoker   Smokeless tobacco: Never Used  Substance Use Topics   Alcohol use: No    Alcohol/week: 0.0 standard drinks    Subjective:  Accompanied by 2 family members; known history of chronic insomnia for at least the past 5 years; notes it has been more problematic in the past 1-2 weeks; has not taken any OTC medications- is allergic to Benadryl; notes she has tried Trazodone in the past but does not remember that it was beneficial;     Objective:  Vitals:  07/26/20 1558  BP: 128/76  Pulse: 97  Temp: 98.5 F (36.9 C)  TempSrc: Oral  SpO2: 99%  Weight: 104 lb (47.2 kg)  Height: 4\' 11"  (1.499 m)    General: Well developed, well nourished, in no acute distress  Head: Normocephalic and atraumatic  Lungs: Respirations unlabored;  CVS exam: normal rate and regular rhythm.  Neurologic: Alert and oriented; speech intact; face symmetrical; moves all extremities well; CNII-XII intact without focal deficit   Assessment:  1. Primary insomnia   2. Needs flu shot     Plan:  Rx for Ativan 0.5 mg qhs to use at night as needed; this has been successful for patient in the past; she is also encouraged to follow-up with her PCP for routine check on chronic pain management;  This visit occurred during the SARS-CoV-2 public  health emergency.  Safety protocols were in place, including screening questions prior to the visit, additional usage of staff PPE, and extensive cleaning of exam room while observing appropriate contact time as indicated for disinfecting solutions.     No follow-ups on file.  Orders Placed This Encounter  Procedures   Flu Vaccine QUAD High Dose(Fluad)    Requested Prescriptions   Signed Prescriptions Disp Refills   LORazepam (ATIVAN) 0.5 MG tablet 30 tablet 1    Sig: Take 1 tablet (0.5 mg total) by mouth at bedtime.

## 2020-07-27 ENCOUNTER — Telehealth: Payer: Self-pay | Admitting: Internal Medicine

## 2020-07-27 NOTE — Telephone Encounter (Signed)
   Patient's daughter calling to report patient has fallen 3 times since seeing Ria Clock on 10/18. Daughter called EMS, patient refused to go to ED. She has busted upper lip, bruised leg, no other injuries. Daughter states she doesn't know what to do for patient.   Can home health be ordered? Patient may be depressed as well

## 2020-07-28 ENCOUNTER — Inpatient Hospital Stay (HOSPITAL_COMMUNITY)
Admission: EM | Admit: 2020-07-28 | Discharge: 2020-08-02 | DRG: 640 | Disposition: A | Discharge status: Skilled Nursing Facility | Payer: Medicare Other | Attending: Internal Medicine | Admitting: Internal Medicine

## 2020-07-28 ENCOUNTER — Other Ambulatory Visit: Payer: Self-pay | Admitting: Internal Medicine

## 2020-07-28 ENCOUNTER — Encounter (HOSPITAL_COMMUNITY): Payer: Self-pay | Admitting: Emergency Medicine

## 2020-07-28 ENCOUNTER — Emergency Department (HOSPITAL_COMMUNITY): Payer: Medicare Other

## 2020-07-28 ENCOUNTER — Other Ambulatory Visit: Payer: Self-pay

## 2020-07-28 DIAGNOSIS — E785 Hyperlipidemia, unspecified: Secondary | ICD-10-CM | POA: Diagnosis present

## 2020-07-28 DIAGNOSIS — E876 Hypokalemia: Principal | ICD-10-CM | POA: Diagnosis present

## 2020-07-28 DIAGNOSIS — R296 Repeated falls: Secondary | ICD-10-CM | POA: Diagnosis present

## 2020-07-28 DIAGNOSIS — Z66 Do not resuscitate: Secondary | ICD-10-CM | POA: Diagnosis not present

## 2020-07-28 DIAGNOSIS — Z79899 Other long term (current) drug therapy: Secondary | ICD-10-CM | POA: Diagnosis not present

## 2020-07-28 DIAGNOSIS — Z515 Encounter for palliative care: Secondary | ICD-10-CM | POA: Diagnosis not present

## 2020-07-28 DIAGNOSIS — S80212A Abrasion, left knee, initial encounter: Secondary | ICD-10-CM | POA: Diagnosis present

## 2020-07-28 DIAGNOSIS — M545 Low back pain, unspecified: Secondary | ICD-10-CM | POA: Diagnosis not present

## 2020-07-28 DIAGNOSIS — R52 Pain, unspecified: Secondary | ICD-10-CM | POA: Diagnosis not present

## 2020-07-28 DIAGNOSIS — W19XXXA Unspecified fall, initial encounter: Secondary | ICD-10-CM | POA: Diagnosis present

## 2020-07-28 DIAGNOSIS — M810 Age-related osteoporosis without current pathological fracture: Secondary | ICD-10-CM | POA: Diagnosis present

## 2020-07-28 DIAGNOSIS — R54 Age-related physical debility: Secondary | ICD-10-CM | POA: Diagnosis present

## 2020-07-28 DIAGNOSIS — G4700 Insomnia, unspecified: Secondary | ICD-10-CM | POA: Diagnosis present

## 2020-07-28 DIAGNOSIS — Z20822 Contact with and (suspected) exposure to covid-19: Secondary | ICD-10-CM | POA: Diagnosis present

## 2020-07-28 DIAGNOSIS — R269 Unspecified abnormalities of gait and mobility: Secondary | ICD-10-CM | POA: Diagnosis present

## 2020-07-28 DIAGNOSIS — Z6821 Body mass index (BMI) 21.0-21.9, adult: Secondary | ICD-10-CM

## 2020-07-28 DIAGNOSIS — G8929 Other chronic pain: Secondary | ICD-10-CM | POA: Diagnosis present

## 2020-07-28 DIAGNOSIS — E43 Unspecified severe protein-calorie malnutrition: Secondary | ICD-10-CM | POA: Diagnosis present

## 2020-07-28 DIAGNOSIS — M4856XA Collapsed vertebra, not elsewhere classified, lumbar region, initial encounter for fracture: Secondary | ICD-10-CM | POA: Diagnosis present

## 2020-07-28 DIAGNOSIS — S022XXA Fracture of nasal bones, initial encounter for closed fracture: Secondary | ICD-10-CM | POA: Diagnosis present

## 2020-07-28 DIAGNOSIS — Z789 Other specified health status: Secondary | ICD-10-CM

## 2020-07-28 DIAGNOSIS — Z0289 Encounter for other administrative examinations: Secondary | ICD-10-CM

## 2020-07-28 DIAGNOSIS — N390 Urinary tract infection, site not specified: Secondary | ICD-10-CM | POA: Diagnosis present

## 2020-07-28 DIAGNOSIS — I10 Essential (primary) hypertension: Secondary | ICD-10-CM | POA: Diagnosis present

## 2020-07-28 DIAGNOSIS — N3000 Acute cystitis without hematuria: Secondary | ICD-10-CM | POA: Insufficient documentation

## 2020-07-28 DIAGNOSIS — Z7189 Other specified counseling: Secondary | ICD-10-CM | POA: Diagnosis not present

## 2020-07-28 DIAGNOSIS — R531 Weakness: Secondary | ICD-10-CM | POA: Diagnosis present

## 2020-07-28 DIAGNOSIS — S80211A Abrasion, right knee, initial encounter: Secondary | ICD-10-CM | POA: Diagnosis present

## 2020-07-28 DIAGNOSIS — F1120 Opioid dependence, uncomplicated: Secondary | ICD-10-CM | POA: Diagnosis present

## 2020-07-28 DIAGNOSIS — K219 Gastro-esophageal reflux disease without esophagitis: Secondary | ICD-10-CM | POA: Diagnosis present

## 2020-07-28 DIAGNOSIS — Y92009 Unspecified place in unspecified non-institutional (private) residence as the place of occurrence of the external cause: Secondary | ICD-10-CM

## 2020-07-28 DIAGNOSIS — I2510 Atherosclerotic heart disease of native coronary artery without angina pectoris: Secondary | ICD-10-CM | POA: Diagnosis present

## 2020-07-28 DIAGNOSIS — Z833 Family history of diabetes mellitus: Secondary | ICD-10-CM

## 2020-07-28 DIAGNOSIS — Z8249 Family history of ischemic heart disease and other diseases of the circulatory system: Secondary | ICD-10-CM | POA: Diagnosis not present

## 2020-07-28 DIAGNOSIS — M48061 Spinal stenosis, lumbar region without neurogenic claudication: Secondary | ICD-10-CM | POA: Diagnosis present

## 2020-07-28 LAB — URINALYSIS, ROUTINE W REFLEX MICROSCOPIC
Bilirubin Urine: NEGATIVE
Glucose, UA: NEGATIVE mg/dL
Ketones, ur: NEGATIVE mg/dL
Nitrite: POSITIVE — AB
Protein, ur: NEGATIVE mg/dL
Specific Gravity, Urine: 1.006 (ref 1.005–1.030)
WBC, UA: >50 WBC/hpf — ABNORMAL HIGH (ref 0–5)
pH: 6 (ref 5.0–8.0)

## 2020-07-28 LAB — CBC WITH DIFFERENTIAL/PLATELET
Abs Immature Granulocytes: 0.03 10*3/uL (ref 0.00–0.07)
Basophils Absolute: 0 10*3/uL (ref 0.0–0.1)
Basophils Relative: 0 %
Eosinophils Absolute: 0.1 10*3/uL (ref 0.0–0.5)
Eosinophils Relative: 1 %
HCT: 42.9 % (ref 36.0–46.0)
Hemoglobin: 13.9 g/dL (ref 12.0–15.0)
Immature Granulocytes: 0 %
Lymphocytes Relative: 16 %
Lymphs Abs: 1.1 10*3/uL (ref 0.7–4.0)
MCH: 28.1 pg (ref 26.0–34.0)
MCHC: 32.4 g/dL (ref 30.0–36.0)
MCV: 86.8 fL (ref 80.0–100.0)
Monocytes Absolute: 0.9 10*3/uL (ref 0.1–1.0)
Monocytes Relative: 13 %
Neutro Abs: 4.8 10*3/uL (ref 1.7–7.7)
Neutrophils Relative %: 70 %
Platelets: 274 10*3/uL (ref 150–400)
RBC: 4.94 MIL/uL (ref 3.87–5.11)
RDW: 13.1 % (ref 11.5–15.5)
WBC: 6.9 10*3/uL (ref 4.0–10.5)
nRBC: 0 % (ref 0.0–0.2)

## 2020-07-28 LAB — CK: Total CK: 324 U/L — ABNORMAL HIGH (ref 38–234)

## 2020-07-28 LAB — RESPIRATORY PANEL BY RT PCR (FLU A&B, COVID)
Influenza A by PCR: NEGATIVE
Influenza B by PCR: NEGATIVE
SARS Coronavirus 2 by RT PCR: NEGATIVE

## 2020-07-28 LAB — BASIC METABOLIC PANEL
Anion gap: 12 (ref 5–15)
BUN: 15 mg/dL (ref 8–23)
CO2: 26 mmol/L (ref 22–32)
Calcium: 9.2 mg/dL (ref 8.9–10.3)
Chloride: 102 mmol/L (ref 98–111)
Creatinine, Ser: 0.98 mg/dL (ref 0.44–1.00)
GFR, Estimated: 52 mL/min — ABNORMAL LOW (ref >60)
Glucose, Bld: 104 mg/dL — ABNORMAL HIGH (ref 70–99)
Potassium: 2.9 mmol/L — ABNORMAL LOW (ref 3.5–5.1)
Sodium: 140 mmol/L (ref 135–145)

## 2020-07-28 LAB — CBG MONITORING, ED: Glucose-Capillary: 93 mg/dL (ref 70–99)

## 2020-07-28 MED ORDER — SIMVASTATIN 20 MG PO TABS
20.0000 mg | ORAL_TABLET | Freq: Every day | ORAL | Status: DC
  Administered 2020-07-28 – 2020-08-01 (×5): 20 mg via ORAL
  Filled 2020-07-28 (×5): qty 1

## 2020-07-28 MED ORDER — PANTOPRAZOLE SODIUM 40 MG PO TBEC
40.0000 mg | DELAYED_RELEASE_TABLET | Freq: Every day | ORAL | Status: DC
  Administered 2020-07-28 – 2020-08-02 (×6): 40 mg via ORAL
  Filled 2020-07-28 (×6): qty 1

## 2020-07-28 MED ORDER — SODIUM CHLORIDE 0.9 % IV SOLN
1.0000 g | Freq: Once | INTRAVENOUS | Status: AC
  Administered 2020-07-28: 1 g via INTRAVENOUS
  Filled 2020-07-28: qty 10

## 2020-07-28 MED ORDER — DOCUSATE SODIUM 100 MG PO CAPS
100.0000 mg | ORAL_CAPSULE | Freq: Every day | ORAL | Status: DC
  Administered 2020-07-29: 100 mg via ORAL
  Filled 2020-07-28: qty 1

## 2020-07-28 MED ORDER — ENSURE ENLIVE PO LIQD
237.0000 mL | Freq: Two times a day (BID) | ORAL | Status: DC
  Administered 2020-07-29 – 2020-08-02 (×10): 237 mL via ORAL
  Filled 2020-07-28: qty 237

## 2020-07-28 MED ORDER — SODIUM CHLORIDE 0.9 % IV BOLUS: 500.0000 mL | Freq: Once | INTRAVENOUS | Status: DC

## 2020-07-28 MED ORDER — ONDANSETRON HCL 4 MG/2ML IJ SOLN: 4.0000 mg | Freq: Four times a day (QID) | INTRAMUSCULAR | Status: DC | PRN

## 2020-07-28 MED ORDER — FENTANYL CITRATE (PF) 100 MCG/2ML IJ SOLN
50.0000 ug | Freq: Once | INTRAMUSCULAR | Status: AC
  Administered 2020-07-28: 50 ug via INTRAVENOUS
  Filled 2020-07-28: qty 2

## 2020-07-28 MED ORDER — SODIUM CHLORIDE 0.9% FLUSH
3.0000 mL | Freq: Once | INTRAVENOUS | Status: AC
  Administered 2020-07-28: 3 mL via INTRAVENOUS

## 2020-07-28 MED ORDER — TRIAMTERENE-HCTZ 37.5-25 MG PO TABS
1.0000 | ORAL_TABLET | Freq: Every day | ORAL | Status: DC
  Administered 2020-07-28 – 2020-08-02 (×6): 1 via ORAL
  Filled 2020-07-28 (×6): qty 1

## 2020-07-28 MED ORDER — LIFITEGRAST 5 % OP SOLN: 1.0000 [drp] | Freq: Every day | OPHTHALMIC | Status: DC

## 2020-07-28 MED ORDER — HYDRALAZINE HCL 25 MG PO TABS
25.0000 mg | ORAL_TABLET | Freq: Four times a day (QID) | ORAL | Status: DC | PRN
  Administered 2020-07-28: 25 mg via ORAL
  Filled 2020-07-28: qty 1

## 2020-07-28 MED ORDER — SODIUM CHLORIDE 0.9 % IV SOLN
1.0000 g | INTRAVENOUS | Status: DC
  Administered 2020-07-29 – 2020-07-31 (×4): 1 g via INTRAVENOUS
  Filled 2020-07-28: qty 10
  Filled 2020-07-28 (×4): qty 1
  Filled 2020-07-28: qty 10

## 2020-07-28 MED ORDER — POLYETHYLENE GLYCOL 3350 17 G PO PACK: 17.0000 g | PACK | Freq: Every day | ORAL | Status: DC | PRN

## 2020-07-28 MED ORDER — SODIUM CHLORIDE 0.9 % IV SOLN
INTRAVENOUS | Status: AC
Start: 2020-07-28 — End: 2020-07-29

## 2020-07-28 MED ORDER — SODIUM CHLORIDE 0.9 % IV BOLUS
1000.0000 mL | Freq: Once | INTRAVENOUS | Status: AC
  Administered 2020-07-28: 1000 mL via INTRAVENOUS

## 2020-07-28 MED ORDER — ACETAMINOPHEN 500 MG PO TABS
1000.0000 mg | ORAL_TABLET | Freq: Four times a day (QID) | ORAL | Status: DC | PRN
  Administered 2020-07-28 – 2020-08-01 (×2): 1000 mg via ORAL
  Filled 2020-07-28 (×2): qty 2

## 2020-07-28 MED ORDER — ENOXAPARIN SODIUM 30 MG/0.3ML ~~LOC~~ SOLN
30.0000 mg | SUBCUTANEOUS | Status: DC
  Administered 2020-07-28 – 2020-08-01 (×5): 30 mg via SUBCUTANEOUS
  Filled 2020-07-28 (×6): qty 0.3

## 2020-07-28 MED ORDER — POTASSIUM CHLORIDE CRYS ER 20 MEQ PO TBCR
40.0000 meq | EXTENDED_RELEASE_TABLET | ORAL | Status: AC
  Administered 2020-07-28: 40 meq via ORAL
  Filled 2020-07-28: qty 2

## 2020-07-28 MED ORDER — ONDANSETRON HCL 4 MG PO TABS: 4.0000 mg | ORAL_TABLET | Freq: Four times a day (QID) | ORAL | Status: DC | PRN

## 2020-07-28 MED ORDER — DRONABINOL 2.5 MG PO CAPS
2.5000 mg | ORAL_CAPSULE | Freq: Two times a day (BID) | ORAL | Status: DC
  Administered 2020-07-29: 2.5 mg via ORAL
  Filled 2020-07-28: qty 1

## 2020-07-28 MED ORDER — DILTIAZEM HCL ER COATED BEADS 240 MG PO CP24
240.0000 mg | ORAL_CAPSULE | Freq: Every day | ORAL | Status: DC
  Administered 2020-07-28 – 2020-08-02 (×6): 240 mg via ORAL
  Filled 2020-07-28 (×6): qty 1

## 2020-07-28 MED ORDER — MELATONIN 3 MG PO TABS
3.0000 mg | ORAL_TABLET | Freq: Every day | ORAL | Status: DC
  Administered 2020-07-28 – 2020-08-01 (×5): 3 mg via ORAL
  Filled 2020-07-28 (×6): qty 1

## 2020-07-28 MED ORDER — FENTANYL 100 MCG/HR TD PT72
1.0000 | MEDICATED_PATCH | TRANSDERMAL | Status: DC
  Administered 2020-07-31: 1 via TRANSDERMAL
  Filled 2020-07-28: qty 1

## 2020-07-28 NOTE — ED Provider Notes (Signed)
[facility]   CSN: [id number] Arrival date & time: 07/28/20  [id number]     History Chief Complaint  Patient presents with  . Fall    Lilli LightLouise N Ambrosio is a 86 y.o. female.  Patient with a fall several days ago.  Lives by herself.  Had a fall where she spent the night on the floor.  A rescue team came in was able to get her up.  She has not had much to eat or drink over the last several days since that fall.  Having mostly upper back and low back pain.  Overall denies any shortness of breath, abdominal pain.  She is not on blood thinners.  She does not have much social support at home.  She has a daughter but she does not live with the patient.  She lives at home by herself.  Having difficult time taking care of her basic needs at home.  She uses a walker at home.  The history is provided by the patient.  Fall This is a new problem. The problem has been resolved. Pertinent negatives include no chest pain, no abdominal pain, no headaches and no shortness of breath. The symptoms are aggravated by walking. Nothing relieves the symptoms. She has tried nothing for the symptoms. The treatment provided no relief.       Past Medical History:  Diagnosis Date  . Back pain   . Cataracts, both eyes   . CHF (congestive heart failure) (HCC)   . Chronic narcotic use   . Chronic pain   . Coronary artery disease   . Hyperlipidemia   . Hypertension   . Mitral valve prolapse   . Osteoarthritis   . Osteoporosis   . Pain management   . Reflux     Patient Active Problem List   Diagnosis Date Noted  . Fall at home, initial encounter 07/28/2020  . Fall 07/28/2020  . Pain management contract signed 04/08/2020  . Protein-calorie malnutrition, severe (HCC) 03/11/2020  . Hyperlipidemia 12/20/2017  . Routine general medical examination at a health care facility 12/19/2016  . Insomnia 06/19/2015  . Narcotic dependence (HCC) 12/13/2013  . Chronic back  pain 12/13/2013  . Hypertension     Past Surgical History:  Procedure Laterality Date  . ABDOMINAL HYSTERECTOMY    . APPENDECTOMY    . BLADDER SURGERY    . BREAST SURGERY    . SHOULDER SURGERY     Right     OB History   No obstetric history on file.     Family History  Problem Relation Age of Onset  . Hypertension Mother   . Diabetes Mother   . Heart disease Father   . Hypertension Father   . Diabetes Father     Social History   Tobacco Use  . Smoking status: Never Smoker  . Smokeless tobacco: Never Used  Vaping Use  . Vaping Use: Never used  Substance Use Topics  . Alcohol use: No    Alcohol/week: 0.0 standard drinks  . Drug use: No    Home Medications Prior to Admission medications   Medication Sig Start Date End Date Taking? Authorizing Provider  acetaminophen (TYLENOL) 500 MG tablet Take 1,000 mg by mouth every 6 (six) hours as needed for moderate pain.    [provider]  BESIVANCE 0.6 % SUSP  03/16/20   [provider]  cloNIDine (CATAPRES) 0.1 MG tablet TAKE 1 TABLET DAILY FOR BLOOD PRESSURE>180 OR >  100. 07/05/18   [name], MD  diltiazem (CARDIZEM CD) 120 MG 24 hr capsule TAKE 2 CAPSULES DAILY. 05/03/20   [name], MD  docusate sodium 100 MG CAPS Take 100 mg by mouth daily. 12/15/13   Renford Dills, MD  fentaNYL (DURAGESIC) 100 MCG/HR APPLY 1 PATCH EVERY OTHER DAY AS DIRECTED 07/10/20   Corwin Levins, MD  LORazepam (ATIVAN) 0.5 MG tablet Take 1 tablet (0.5 mg total) by mouth at bedtime. 07/26/20   Olive Bass, FNP  pantoprazole (PROTONIX) 40 MG tablet TAKE 1 TABLET EACH DAY. 05/03/20   [name], MD  polyethylene glycol powder (GLYCOLAX/MIRALAX) powder TAKE 17 GRAMS TWICE A DAY AS NEEDED Patient taking differently: Take 1 Container by mouth daily as needed for mild constipation.  09/04/17   [name], MD  PROLENSA 0.07 % SOLN  03/16/20   [provider]  simvastatin (ZOCOR) 20  MG tablet TAKE 1 TABLET IN THE P.M. 05/03/20   [name], MD  triamterene-hydrochlorothiazide [facility]) 37.5-25 MG tablet TAKE 1 TABLET EACH DAY. 05/03/20   [name], MD  XIIDRA 5 % SOLN Place 1 drop into both eyes in the morning and at bedtime.  02/05/20   [provider]    Allergies    Amitiza [lubiprostone], Benadryl [diphenhydramine], Ultram [tramadol], Aspirin, Ibuprofen, and Sulfa antibiotics  Review of Systems   Review of Systems  Constitutional: Negative for chills and fever.  HENT: Negative for ear pain and sore throat.   Eyes: Negative for pain and visual disturbance.  Respiratory: Negative for cough and shortness of breath.   Cardiovascular: Negative for chest pain and palpitations.  Gastrointestinal: Negative for abdominal pain and vomiting.  Genitourinary: Negative for dysuria and hematuria.  Musculoskeletal: Positive for arthralgias, back pain and gait problem.  Skin: Negative for color change and rash.  Neurological: Negative for seizures, syncope and headaches.  All other systems reviewed and are negative.   Physical Exam Updated Vital Signs  ED Triage Vitals [07/28/20 0943]  Enc Vitals Group     BP (!) 144/84     Pulse Rate 94     Resp 18     Temp 98 F (36.7 C)     Temp Source Oral     SpO2 100 %     Weight 104 lb (47.2 kg)     Height      Head Circumference      Peak Flow      Pain Score 10     Pain Loc      Pain Edu?      Excl. in GC?     Physical Exam Vitals and nursing note reviewed.  Constitutional:      General: She is not in acute distress.    Appearance: She is well-developed. She is not ill-appearing.  HENT:     Head: Normocephalic and atraumatic.  Eyes:     Extraocular Movements: Extraocular movements intact.     Conjunctiva/sclera: Conjunctivae normal.     Pupils: Pupils are equal, round, and reactive to light.     Comments: No signs of eye entrapment  Cardiovascular:     Rate and Rhythm: Normal  rate and regular rhythm.     Heart sounds: No murmur heard.   Pulmonary:     Effort: Pulmonary effort is normal. No respiratory distress.     Breath sounds: Normal breath sounds.  Abdominal:     Palpations: Abdomen is soft.  Tenderness: There is no abdominal tenderness.  Musculoskeletal:        General: Tenderness present.     Cervical back: Normal range of motion and neck supple. No tenderness.     Comments: Tenderness to upper back and lower back with midline tenderness in lumbar region  Skin:    General: Skin is warm and dry.     Capillary Refill: Capillary refill takes less than 2 seconds.     Comments: Abrasions to bilateral knees, bruising to the left side of the face around the eye  Neurological:     General: No focal deficit present.     Mental Status: She is alert and oriented to person, place, and time.     Cranial Nerves: No cranial nerve deficit.     Sensory: No sensory deficit.     Motor: No weakness.     Coordination: Coordination normal.     Comments: 5+ out of 5 strength throughout, normal sensation, no drift     ED Results / Procedures / Treatments   Labs (all labs ordered are listed, but only abnormal results are displayed) Labs Reviewed  BASIC METABOLIC PANEL - Abnormal; Notable for the following components:      Result Value   Potassium 2.9 (*)    Glucose, Bld 104 (*)    GFR, Estimated 52 (*)    All other components within normal limits  URINALYSIS, ROUTINE W REFLEX MICROSCOPIC - Abnormal; Notable for the following components:   APPearance HAZY (*)    Hgb urine dipstick SMALL (*)    Nitrite POSITIVE (*)    Leukocytes,Ua LARGE (*)    WBC, UA >50 (*)    Bacteria, UA MANY (*)    All other components within normal limits  CK - Abnormal; Notable for the following components:   Total CK 324 (*)    All other components within normal limits  RESPIRATORY PANEL BY RT PCR (FLU A&B, COVID)  URINE CULTURE  CBC WITH DIFFERENTIAL/PLATELET  TSH  CBG  MONITORING, ED    EKG EKG Interpretation  Date/Time:  Wednesday July 28 2020 09:46:22 EDT Ventricular Rate:  107 PR Interval:  128 QRS Duration: 82 QT Interval:  366 QTC Calculation: 488 R Axis:   -54 Text Interpretation: Sinus tachycardia Left anterior fascicular block Abnormal ECG Confirmed by Virgina Norfolk [phone]) on 07/28/2020 9:57:54 AM   Radiology DG Chest 2 View  Result Date: 07/28/2020 CLINICAL DATA:  Fall, back pain EXAM: CHEST - 2 VIEW COMPARISON:  04/04/2015 FINDINGS: Lungs are clear. No pneumothorax or pleural effusion. Cardiomediastinal silhouette unremarkable. Pulmonary vascularity is normal. Moderate to severe degenerative changes are seen within the thoracic spine, more severe within the midthoracic spine. No acute bone abnormality identified. Right total shoulder arthroplasty has been performed. IMPRESSION: No active cardiopulmonary disease. Electronically Signed   By: Helyn Numbers MD   On: 07/28/2020 10:44   DG Lumbar Spine Complete  Result Date: 07/28/2020 CLINICAL DATA:  Fall, back pain EXAM: LUMBAR SPINE - COMPLETE 4+ VIEW COMPARISON:  CT abdomen pelvis 03/08/2020 FINDINGS: There is normal lumbar lordosis. There is interval development of a compression fracture of L1 with approximately 50% loss of height. No retropulsion. Stable grade 1 anterolisthesis of L3 upon L4 and L4 upon L5. Stable minimal retrolisthesis of L5 upon S1. Advanced degenerative disc disease is noted at L4-5 with changes of mild degenerative disc disease noted at L3-4 and L5-S1. Remaining intervertebral disc heights and vertebral body height appears preserved. Oblique views demonstrate  no evidence of pars defect. Advanced atherosclerotic calcification is seen within the abdominal aorta without evidence of aneurysm. The paraspinal soft tissues are unremarkable. IMPRESSION: Interval L1 compression fracture, age indeterminate but new since 03/08/2020. Correlation for point tenderness would be  helpful in determining acuity. MRI examination may also be more helpful in determining the acuity of this fracture. Stable degenerative changes L3-S1, most severe at L4-5. Electronically Signed   By: Helyn Numbers MD   On: 07/28/2020 10:49   CT Head Wo Contrast  Result Date: 07/28/2020 CLINICAL DATA:  Head trauma, minor. Facial trauma. Neck trauma. Additional history provided: Fall, striking back of head. EXAM: CT HEAD WITHOUT CONTRAST CT MAXILLOFACIAL WITHOUT CONTRAST CT CERVICAL SPINE WITHOUT CONTRAST TECHNIQUE: Multidetector CT imaging of the head, cervical spine, and maxillofacial structures were performed using the standard protocol without intravenous contrast. Multiplanar CT image reconstructions of the cervical spine and maxillofacial structures were also generated. COMPARISON:  Prior head CT 03/08/2020. Radiographs of the cervical spine 12/23/2006. Maxillofacial CT 12/24/2016. FINDINGS: CT HEAD FINDINGS Brain: Generalized cerebral atrophy. Mild ill-defined hypoattenuation within cerebral white matter is nonspecific, but consistent with chronic small vessel ischemic disease. There is no acute intracranial hemorrhage. No demarcated cortical infarct. No extra-axial fluid collection. No evidence of intracranial mass. No midline shift. Vascular: No hyperdense vessel.  Atherosclerotic calcifications. Skull: Normal. Negative for fracture or focal lesion. Other: No significant mastoid effusion. CT MAXILLOFACIAL FINDINGS Osseous: There is a mildly displaced fracture deformity of the left nasal bone which is new as compared to the prior maxillofacial CT of 12/24/2016 and may be acute. Elsewhere, no acute maxillofacial fracture is identified. Orbits: Acute finding. Redemonstrated anterior globe calcifications bilaterally. The globes are normal in size and contour. The extraocular muscles and optic nerve sheath complexes are symmetric and unremarkable. Sinuses: Minimal ethmoid sinus mucosal thickening. Soft  tissues: Subtle soft tissue swelling is questioned overlying the nose. CT CERVICAL SPINE FINDINGS Alignment: Straightening of the expected cervical lordosis. 2 mm C7-T1 grade 1 anterolisthesis. Skull base and vertebrae: The basion-dental and atlanto-dental intervals are maintained.No evidence of acute fracture to the cervical spine. Soft tissues and spinal canal: No prevertebral fluid or swelling. No visible canal hematoma. Disc levels: C3-C4 and C4-C5 vertebral fusion. Cervical spondylosis with multilevel disc space narrowing at the remaining levels, disc bulges, posterior disc osteophytes, uncovertebral hypertrophy and facet arthrosis. No high-grade bony spinal canal stenosis. Multilevel bony neural foraminal narrowing. Upper chest: No consolidation within the imaged lung apices. No visible pneumothorax. IMPRESSION: CT head: 1. No evidence of acute intracranial abnormality. 2. Generalized cerebral atrophy and mild chronic small vessel ischemic disease, stable as compared to the head CT of 03/08/2020. CT maxillofacial: 1. A mildly displaced fracture deformity of the left nasal bone is new as compared to the maxillofacial CT of 12/24/2016, and may be acute. Clinical correlation is recommended. Question mild overlying soft tissue swelling. 2. Minimal ethmoid sinus mucosal thickening. CT cervical spine: 1. No evidence of acute fracture to the cervical spine. 2. 2 mm C7-T1 grade 1 anterolisthesis. 3. C3-C4 and C4-C5 vertebral fusion. 4. Cervical spondylosis as described. Electronically Signed   By: Jackey Loge DO   On: 07/28/2020 11:15   CT Cervical Spine Wo Contrast  Result Date: 07/28/2020 CLINICAL DATA:  Head trauma, minor. Facial trauma. Neck trauma. Additional history provided: Fall, striking back of head. EXAM: CT HEAD WITHOUT CONTRAST CT MAXILLOFACIAL WITHOUT CONTRAST CT CERVICAL SPINE WITHOUT CONTRAST TECHNIQUE: Multidetector CT imaging of the head, cervical spine, and maxillofacial structures  were  performed using the standard protocol without intravenous contrast. Multiplanar CT image reconstructions of the cervical spine and maxillofacial structures were also generated. COMPARISON:  Prior head CT 03/08/2020. Radiographs of the cervical spine 12/23/2006. Maxillofacial CT 12/24/2016. FINDINGS: CT HEAD FINDINGS Brain: Generalized cerebral atrophy. Mild ill-defined hypoattenuation within cerebral white matter is nonspecific, but consistent with chronic small vessel ischemic disease. There is no acute intracranial hemorrhage. No demarcated cortical infarct. No extra-axial fluid collection. No evidence of intracranial mass. No midline shift. Vascular: No hyperdense vessel.  Atherosclerotic calcifications. Skull: Normal. Negative for fracture or focal lesion. Other: No significant mastoid effusion. CT MAXILLOFACIAL FINDINGS Osseous: There is a mildly displaced fracture deformity of the left nasal bone which is new as compared to the prior maxillofacial CT of 12/24/2016 and may be acute. Elsewhere, no acute maxillofacial fracture is identified. Orbits: Acute finding. Redemonstrated anterior globe calcifications bilaterally. The globes are normal in size and contour. The extraocular muscles and optic nerve sheath complexes are symmetric and unremarkable. Sinuses: Minimal ethmoid sinus mucosal thickening. Soft tissues: Subtle soft tissue swelling is questioned overlying the nose. CT CERVICAL SPINE FINDINGS Alignment: Straightening of the expected cervical lordosis. 2 mm C7-T1 grade 1 anterolisthesis. Skull base and vertebrae: The basion-dental and atlanto-dental intervals are maintained.No evidence of acute fracture to the cervical spine. Soft tissues and spinal canal: No prevertebral fluid or swelling. No visible canal hematoma. Disc levels: C3-C4 and C4-C5 vertebral fusion. Cervical spondylosis with multilevel disc space narrowing at the remaining levels, disc bulges, posterior disc osteophytes, uncovertebral  hypertrophy and facet arthrosis. No high-grade bony spinal canal stenosis. Multilevel bony neural foraminal narrowing. Upper chest: No consolidation within the imaged lung apices. No visible pneumothorax. IMPRESSION: CT head: 1. No evidence of acute intracranial abnormality. 2. Generalized cerebral atrophy and mild chronic small vessel ischemic disease, stable as compared to the head CT of 03/08/2020. CT maxillofacial: 1. A mildly displaced fracture deformity of the left nasal bone is new as compared to the maxillofacial CT of 12/24/2016, and may be acute. Clinical correlation is recommended. Question mild overlying soft tissue swelling. 2. Minimal ethmoid sinus mucosal thickening. CT cervical spine: 1. No evidence of acute fracture to the cervical spine. 2. 2 mm C7-T1 grade 1 anterolisthesis. 3. C3-C4 and C4-C5 vertebral fusion. 4. Cervical spondylosis as described. Electronically Signed   By: Jackey Loge DO   On: 07/28/2020 11:15   CT Lumbar Spine Wo Contrast  Result Date: 07/28/2020 CLINICAL DATA:  Fall.  L1 compression fracture. EXAM: CT LUMBAR SPINE WITHOUT CONTRAST TECHNIQUE: Multidetector CT imaging of the lumbar spine was performed without intravenous contrast administration. Multiplanar CT image reconstructions were also generated. COMPARISON:  Previous CT 03/08/2020. Lumbar radiography earlier same day. FINDINGS: Segmentation: 5 lumbar type vertebral bodies Alignment: Thoracolumbar curvature convex to the right and lower lumbar curvature convex to the left. Leftward translation of L4 relative to L5 of 1 cm. Anterolisthesis of 4 mm at L3-4. Anterolisthesis of 12 mm at L4-5. Vertebrae: Compression fracture at L1 with loss of height of 60%. Posterior bowing of the posterosuperior margin of the vertebral body. I favor that this is late subacute and essentially healed, though it was not present on 03/08/2020. No discrete fracture line. No paravertebral soft tissue swelling. Chronic discogenic endplate  changes at L4 and L5 especially on the right. No acute regional fracture is seen. Paraspinal and other soft tissues: Aortic atherosclerosis. 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 Disc levels: T12-L1: Posterior bowing of  the posterosuperior margin of the vertebral body indents the thecal sac slightly but does not cause neural compressive stenosis. L1-2: Normal interspace. L2-3: Normal interspace. L3-4: Bilateral facet arthropathy with 4 mm of anterolisthesis. Bulging of the disc. Stenosis of both lateral recesses and of the intervertebral foramen on the right. L4-5: Anterolisthesis of 12 mm due to chronic facet arthropathy. Protrusion of the disc. Severe stenosis of the canal and foramina, right worse than left. L5-S1: Bulging of the disc. Mild facet degeneration. No canal stenosis. Mild bilateral foraminal narrowing. IMPRESSION: 1. Compression fracture at L1 with loss of height of 60%. Posterior bowing of the posterosuperior margin of the vertebral body. I favor that this is late subacute and essentially healed, though it was not present on 03/08/2020. No discrete fracture line. No paravertebral soft tissue swelling. 2. L3-4: Bilateral facet arthropathy with 4 mm of anterolisthesis. Bulging of the disc. Stenosis of both lateral recesses and of the intervertebral foramen on the right. Findings could cause neural compression on the right. 3. L4-5: Anterolisthesis of 12 mm due to chronic facet arthropathy. Protrusion of the disc. Severe stenosis of the canal and foramina, right worse than left. Neural compression quite possible at this level. 4. L5-S1: Bulging of the disc. Mild facet degeneration. Mild bilateral foraminal narrowing. 5. 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 6. Aortic atherosclerosis. Aortic Atherosclerosis (ICD10-I70.0). Electronically Signed   By: Paulina Fusi M.D.   On: 07/28/2020 12:07   CT Maxillofacial Wo Contrast  Result Date:  07/28/2020 CLINICAL DATA:  Head trauma, minor. Facial trauma. Neck trauma. Additional history provided: Fall, striking back of head. EXAM: CT HEAD WITHOUT CONTRAST CT MAXILLOFACIAL WITHOUT CONTRAST CT CERVICAL SPINE WITHOUT CONTRAST TECHNIQUE: Multidetector CT imaging of the head, cervical spine, and maxillofacial structures were performed using the standard protocol without intravenous contrast. Multiplanar CT image reconstructions of the cervical spine and maxillofacial structures were also generated. COMPARISON:  Prior head CT 03/08/2020. Radiographs of the cervical spine 12/23/2006. Maxillofacial CT 12/24/2016. FINDINGS: CT HEAD FINDINGS Brain: Generalized cerebral atrophy. Mild ill-defined hypoattenuation within cerebral white matter is nonspecific, but consistent with chronic small vessel ischemic disease. There is no acute intracranial hemorrhage. No demarcated cortical infarct. No extra-axial fluid collection. No evidence of intracranial mass. No midline shift. Vascular: No hyperdense vessel.  Atherosclerotic calcifications. Skull: Normal. Negative for fracture or focal lesion. Other: No significant mastoid effusion. CT MAXILLOFACIAL FINDINGS Osseous: There is a mildly displaced fracture deformity of the left nasal bone which is new as compared to the prior maxillofacial CT of 12/24/2016 and may be acute. Elsewhere, no acute maxillofacial fracture is identified. Orbits: Acute finding. Redemonstrated anterior globe calcifications bilaterally. The globes are normal in size and contour. The extraocular muscles and optic nerve sheath complexes are symmetric and unremarkable. Sinuses: Minimal ethmoid sinus mucosal thickening. Soft tissues: Subtle soft tissue swelling is questioned overlying the nose. CT CERVICAL SPINE FINDINGS Alignment: Straightening of the expected cervical lordosis. 2 mm C7-T1 grade 1 anterolisthesis. Skull base and vertebrae: The basion-dental and atlanto-dental intervals are maintained.No  evidence of acute fracture to the cervical spine. Soft tissues and spinal canal: No prevertebral fluid or swelling. No visible canal hematoma. Disc levels: C3-C4 and C4-C5 vertebral fusion. Cervical spondylosis with multilevel disc space narrowing at the remaining levels, disc bulges, posterior disc osteophytes, uncovertebral hypertrophy and facet arthrosis. No high-grade bony spinal canal stenosis. Multilevel bony neural foraminal narrowing. Upper chest: No consolidation within the imaged lung apices. No visible pneumothorax. IMPRESSION: CT  head: 1. No evidence of acute intracranial abnormality. 2. Generalized cerebral atrophy and mild chronic small vessel ischemic disease, stable as compared to the head CT of 03/08/2020. CT maxillofacial: 1. A mildly displaced fracture deformity of the left nasal bone is new as compared to the maxillofacial CT of 12/24/2016, and may be acute. Clinical correlation is recommended. Question mild overlying soft tissue swelling. 2. Minimal ethmoid sinus mucosal thickening. CT cervical spine: 1. No evidence of acute fracture to the cervical spine. 2. 2 mm C7-T1 grade 1 anterolisthesis. 3. C3-C4 and C4-C5 vertebral fusion. 4. Cervical spondylosis as described. Electronically Signed   By: Jackey Loge DO   On: 07/28/2020 11:15    Procedures Procedures (including critical care time)  Medications Ordered in ED Medications  cefTRIAXone (ROCEPHIN) 1 g in sodium chloride 0.9 % 100 mL IVPB (1 g Intravenous New Bag/Given 07/28/20 1356)  dronabinol (MARINOL) capsule 2.5 mg (has no administration in time range)  acetaminophen (TYLENOL) tablet 1,000 mg (has no administration in time range)  fentaNYL (DURAGESIC) 100 MCG/HR 1 patch (has no administration in time range)  diltiazem (CARDIZEM CD) 24 hr capsule 240 mg (has no administration in time range)  simvastatin (ZOCOR) tablet 20 mg (has no administration in time range)  triamterene-hydrochlorothiazide (MAXZIDE-25) 37.5-25 MG per  tablet 1 tablet (has no administration in time range)  potassium chloride SA (KLOR-CON) CR tablet 40 mEq (has no administration in time range)  melatonin tablet 3 mg (has no administration in time range)  DSS CAPS 100 mg (has no administration in time range)  pantoprazole (PROTONIX) EC tablet 40 mg (has no administration in time range)  polyethylene glycol powder (GLYCOLAX/MIRALAX) container 255 g (has no administration in time range)  [employer] 5 % SOLN 1 drop (has no administration in time range)  enoxaparin (LOVENOX) injection 40 mg (has no administration in time range)  0.9 %  sodium chloride infusion (has no administration in time range)  ondansetron (ZOFRAN) tablet 4 mg (has no administration in time range)    Or  ondansetron (ZOFRAN) injection 4 mg (has no administration in time range)  feeding supplement (ENSURE ENLIVE / ENSURE PLUS) liquid 237 mL (has no administration in time range)  cefTRIAXone (ROCEPHIN) 1 g in sodium chloride 0.9 % 100 mL IVPB (has no administration in time range)  sodium chloride flush (NS) 0.9 % injection 3 mL (3 mLs Intravenous Given 07/28/20 1103)  sodium chloride 0.9 % bolus 1,000 mL (1,000 mLs Intravenous New Bag/Given 07/28/20 1102)  fentaNYL (SUBLIMAZE) injection 50 mcg (50 mcg Intravenous Given 07/28/20 1122)    ED Course  I have reviewed the triage vital signs and the nursing notes.  Pertinent labs & imaging results that were available during my care of the patient were reviewed by me and considered in my medical decision making (see chart for details).    MDM Rules/Calculators/A&P                          2:19 PM ALYNN ELLITHORPE is an 86 year old female with history of heart disease who presents the ED with body aches after a fall several days ago.  Normal vitals.  No fever.  Basically patient with failure to thrive symptoms over the last several days.  Lives by herself.  Ambulates with a walker.  Had a fall several days ago and spent the night  on the floor.  Sounds like fire department helped her back up and she has been  doing okay the last several days but have been pain throughout her body including her upper back and lower back including lower midline spinal tenderness.  She has bruising around the left side of her face.  She has abrasions to her knees bilaterally.  However no real extremity tenderness on exam.  Normal neuro exam.  Not much social support as patient lives by herself.  Will get lab work give IV fluids and check CT scans of head, face, neck.  Will touch base with social work about home health services.  Patient likely would benefit from being in assisted living facility at some point.  Will screen for urine infection, dehydration.  Patient on x-ray of back with possible L1 compression fracture however on CT scan it appears that this is old and looks healed over.  Overall suspect some chronic low back pain given recent fall.  She has nondisplaced nasal fracture.  No nasal septal hematoma.  Head CT and neck CT and face CT otherwise unremarkable.  Lab work overall reassuring.  Mild elevation in CK but given IV fluids.  No concern for rhabdomyolysis.  Kidney injury or liver injury.  Social work has been contacted and will help set up some home health services.  Recommend that she follow-up with her primary care doctor to discuss assisted living.  Awaiting urinalysis as she might need antibiotic.  Will have her follow-up with ENT for nasal fracture.  Urinalysis positive for infection.  To be admitted given severe weakness, UTI, concern for dehydration.  Admitted to medicine in stable condition.  This chart was dictated using voice recognition software.  Despite best efforts to proofread,  errors can occur which can change the documentation meaning.   Final Clinical Impression(s) / ED Diagnoses Final diagnoses:  Weakness  Closed fracture of nasal bone, initial encounter  Acute cystitis without hematuria    Rx / DC Orders ED  Discharge Orders    None       Virgina Norfolk, DO 07/28/20 1419

## 2020-07-28 NOTE — ED Triage Notes (Addendum)
Pt states she fell last night and laid on the floor all night long.  Unsure what caused her to fall.  Denies dizziness.  Reports pain to back of head and across shoulder blades.  Denies blood thinners.  Denies LOC.

## 2020-07-28 NOTE — Discharge Planning (Signed)
RNCM spoke with daughter, [name]  [phone]) regarding discharge planning.  [name] states she has been staying with pt and daughter with profound mental retardation at night for a couple of months to help out.  [name] states pt is getting more forgetful and deconditioned.  RNCM spoke with pt at bedside who is agreeable to home health (daughter states she did not participate with last company).  RNCM will continue to follow for disposition needs.

## 2020-07-28 NOTE — ED Notes (Signed)
Pt was incontinent of loose stool.  Pt cleaned up

## 2020-07-28 NOTE — H&P (Signed)
History and Physical    Claudia [name] ZOX:[id number] DOB: 07-Apr-1931 DOA: 07/28/2020  PCP: Myrlene Brokerrawford, Elizabeth A, MD (Confirm with patient/family/NH records and if not entered, this has to [facility]) Patient coming from: Home  I have personally briefly reviewed patient's [facility]  Chief Complaint: Feeling so weak  HPI: Claudia [name] is a 86 y.o. female with medical history significant of HTN, chronic narcotic dependence, chronic back pain, hyperlipidemia, osteoporosis, GERD, presented with multiple falls.  Patient baseline ambulation dysfunction, depends on walker, and she lives by herself with a disabled 84 year old adopted daughter who needs around-the-clock care, patient does have home health aide daytime and her other daughter stops by in the evenings.  It appears that patient has had trouble for sleep for couple weeks and went to her PCP on Monday, who prescribed patient with Ativan 0.5 mg which patient started to take last night.  Several hours after patient took Ativan she sustained 3 episodes of fall.  Patient described her legs were feeling weak but denied any specifically one-sided weaker than the other.  Her daughter reported over the phone that the patient appeared to be confused last night and before that she has had urinary frequency the whole day yesterday.  Patient denied any dysuria, no fever chills no abdominal pain.  Another problem patient has is she has been losing weight, she has had poor appetite for several months, she denied of feeling nauseous vomiting or abdominal pain.  Patient has cataract on the left eye for which she had surgery 3 months ago and she reported her eyesight has been sufficient. ED Course: CT head negative for injuries, maxillary and facial CT showed acute mild displaced left nasal fracture.  Lumbar spine L1 compression fracture subacute, L3-L4, L4-L5 bulging disc and stenosis of canal and foramina.   Blood work, potassium 2.9.  CK 300  Review of Systems: As per HPI otherwise 14 point review of systems negative.    Past Medical History:  Diagnosis Date  . Back pain   . Cataracts, both eyes   . CHF (congestive heart failure) (HCC)   . Chronic narcotic use   . Chronic pain   . Coronary artery disease   . Hyperlipidemia   . Hypertension   . Mitral valve prolapse   . Osteoarthritis   . Osteoporosis   . Pain management   . Reflux     Past Surgical History:  Procedure Laterality Date  . ABDOMINAL HYSTERECTOMY    . APPENDECTOMY    . BLADDER SURGERY    . BREAST SURGERY    . SHOULDER SURGERY     Right     reports that she has never smoked. She has never used smokeless tobacco. She reports that she does not drink alcohol and does not use drugs.  Allergies  Allergen Reactions  . Amitiza [Lubiprostone] Nausea And Vomiting  . Benadryl [Diphenhydramine] Other (See Comments)    Makes me hyper  . Ultram [Tramadol]     Made sick to stomach  . Aspirin Other (See Comments)    Upsets stomach, and nervousness. "I feel like I'm going to scream."  . Ibuprofen Other (See Comments)    Jittery, nervousness, similar to aspirin  . Sulfa Antibiotics Nausea And Vomiting and Rash    Family History  Problem Relation Age of Onset  . Hypertension Mother   . Diabetes Mother   . Heart disease Father   . Hypertension  Father   . Diabetes Father      Prior to Admission medications   Medication Sig Start Date End Date Taking? Authorizing Provider  acetaminophen (TYLENOL) 500 MG tablet Take 1,000 mg by mouth every 6 (six) hours as needed for moderate pain.    [provider]  BESIVANCE 0.6 % SUSP  03/16/20   [provider]  cloNIDine (CATAPRES) 0.1 MG tablet TAKE 1 TABLET DAILY FOR BLOOD PRESSURE>180 OR >100. 07/05/18   [name], MD  diltiazem (CARDIZEM CD) 120 MG 24 hr capsule TAKE 2 CAPSULES DAILY. 05/03/20   [name], MD  docusate sodium 100 MG  CAPS Take 100 mg by mouth daily. 12/15/13   Renford Dills, MD  fentaNYL (DURAGESIC) 100 MCG/HR APPLY 1 PATCH EVERY OTHER DAY AS DIRECTED 07/10/20   Corwin Levins, MD  LORazepam (ATIVAN) 0.5 MG tablet Take 1 tablet (0.5 mg total) by mouth at bedtime. 07/26/20   Olive Bass, FNP  pantoprazole (PROTONIX) 40 MG tablet TAKE 1 TABLET EACH DAY. 05/03/20   [name], MD  polyethylene glycol powder (GLYCOLAX/MIRALAX) powder TAKE 17 GRAMS TWICE A DAY AS NEEDED Patient taking differently: Take 1 Container by mouth daily as needed for mild constipation.  09/04/17   [name], MD  PROLENSA 0.07 % SOLN  03/16/20   [provider]  simvastatin (ZOCOR) 20 MG tablet TAKE 1 TABLET IN THE P.M. 05/03/20   [name], MD  triamterene-hydrochlorothiazide [facility]) 37.5-25 MG tablet TAKE 1 TABLET EACH DAY. 05/03/20   [name], MD  XIIDRA 5 % SOLN Place 1 drop into both eyes in the morning and at bedtime.  02/05/20   [provider]    Physical Exam: Vitals:   07/28/20 1126 07/28/20 1245 07/28/20 1315 07/28/20 1345  BP:  (!) 178/80 (!) 189/81 (!) 178/86  Pulse: 78 82 80 87  Resp: 16 (!) 23 16 (!) 21  Temp:      TempSrc:      SpO2: 100% 100% 100% 100%  Weight:        Constitutional: NAD, calm, comfortable Vitals:   07/28/20 1126 07/28/20 1245 07/28/20 1315 07/28/20 1345  BP:  (!) 178/80 (!) 189/81 (!) 178/86  Pulse: 78 82 80 87  Resp: 16 (!) 23 16 (!) 21  Temp:      TempSrc:      SpO2: 100% 100% 100% 100%  Weight:       Eyes: PERRL, lids and conjunctivae normal ENMT: Mucous membranes are dry. Posterior pharynx clear of any exudate or lesions.Normal dentition.  Neck: normal, supple, no masses, no thyromegaly Respiratory: clear to auscultation bilaterally, no wheezing, no crackles. Normal respiratory effort. No accessory muscle use.  Cardiovascular: Regular rate and rhythm, no murmurs / rubs / gallops. No extremity edema. 2+ pedal  pulses. No carotid bruits.  Abdomen: no tenderness, no masses palpated. No hepatosplenomegaly. Bowel sounds positive.  Musculoskeletal: no clubbing / cyanosis. No joint deformity upper and lower extremities. Good ROM, no contractures. Normal muscle tone.  Skin: no rashes, lesions, ulcers. No induration Neurologic: CN 2-12 grossly intact. Sensation intact, DTR normal. Strength 5/5 in all 4.  Psychiatric: Normal judgment and insight. Alert and oriented x 3. Normal mood.     Labs on Admission: I have personally reviewed following labs and imaging studies  CBC: Recent Labs  Lab 07/28/20 1017  WBC 6.9  NEUTROABS 4.8  HGB 13.9  HCT 42.9  MCV 86.8  PLT 274  Basic Metabolic Panel: Recent Labs  Lab 07/28/20 1017  NA 140  K 2.9*  CL 102  CO2 26  GLUCOSE 104*  BUN 15  CREATININE 0.98  CALCIUM 9.2   GFR: Estimated Creatinine Clearance: 27.6 mL/min (by C-G formula based on SCr of 0.98 mg/dL). Liver Function Tests: No results for input(s): AST, ALT, ALKPHOS, BILITOT, PROT, ALBUMIN in the last 168 hours. No results for input(s): LIPASE, AMYLASE in the last 168 hours. No results for input(s): AMMONIA in the last 168 hours. Coagulation Profile: No results for input(s): INR, PROTIME in the last 168 hours. Cardiac Enzymes: Recent Labs  Lab 07/28/20 1017  CKTOTAL 324*   BNP (last 3 results) No results for input(s): PROBNP in the last 8760 hours. HbA1C: No results for input(s): HGBA1C in the last 72 hours. CBG: Recent Labs  Lab 07/28/20 0952  GLUCAP 93   Lipid Profile: No results for input(s): CHOL, HDL, LDLCALC, TRIG, CHOLHDL, LDLDIRECT in the last 72 hours. Thyroid Function Tests: No results for input(s): TSH, T4TOTAL, FREET4, T3FREE, THYROIDAB in the last 72 hours. Anemia Panel: No results for input(s): VITAMINB12, FOLATE, FERRITIN, TIBC, IRON, RETICCTPCT in the last 72 hours. Urine analysis:    Component Value Date/Time   COLORURINE YELLOW 07/28/2020 1230    APPEARANCEUR HAZY (A) 07/28/2020 1230   LABSPEC 1.006 07/28/2020 1230   PHURINE 6.0 07/28/2020 1230   GLUCOSEU NEGATIVE 07/28/2020 1230   HGBUR SMALL (A) 07/28/2020 1230   BILIRUBINUR NEGATIVE 07/28/2020 1230   BILIRUBINUR Neg 12/16/2018 1328   KETONESUR NEGATIVE 07/28/2020 1230   PROTEINUR NEGATIVE 07/28/2020 1230   UROBILINOGEN 0.2 12/16/2018 1328   UROBILINOGEN 0.2 04/04/2015 0942   NITRITE POSITIVE (A) 07/28/2020 1230   LEUKOCYTESUR LARGE (A) 07/28/2020 1230    Radiological Exams on Admission: DG Chest 2 View  Result Date: 07/28/2020 CLINICAL DATA:  Fall, back pain EXAM: CHEST - 2 VIEW COMPARISON:  04/04/2015 FINDINGS: Lungs are clear. No pneumothorax or pleural effusion. Cardiomediastinal silhouette unremarkable. Pulmonary vascularity is normal. Moderate to severe degenerative changes are seen within the thoracic spine, more severe within the midthoracic spine. No acute bone abnormality identified. Right total shoulder arthroplasty has been performed. IMPRESSION: No active cardiopulmonary disease. Electronically Signed   By: Helyn Numbers MD   On: 07/28/2020 10:44   DG Lumbar Spine Complete  Result Date: 07/28/2020 CLINICAL DATA:  Fall, back pain EXAM: LUMBAR SPINE - COMPLETE 4+ VIEW COMPARISON:  CT abdomen pelvis 03/08/2020 FINDINGS: There is normal lumbar lordosis. There is interval development of a compression fracture of L1 with approximately 50% loss of height. No retropulsion. Stable grade 1 anterolisthesis of L3 upon L4 and L4 upon L5. Stable minimal retrolisthesis of L5 upon S1. Advanced degenerative disc disease is noted at L4-5 with changes of mild degenerative disc disease noted at L3-4 and L5-S1. Remaining intervertebral disc heights and vertebral body height appears preserved. Oblique views demonstrate no evidence of pars defect. Advanced atherosclerotic calcification is seen within the abdominal aorta without evidence of aneurysm. The paraspinal soft tissues are  unremarkable. IMPRESSION: Interval L1 compression fracture, age indeterminate but new since 03/08/2020. Correlation for point tenderness would be helpful in determining acuity. MRI examination may also be more helpful in determining the acuity of this fracture. Stable degenerative changes L3-S1, most severe at L4-5. Electronically Signed   By: Helyn Numbers MD   On: 07/28/2020 10:49   CT Head Wo Contrast  Result Date: 07/28/2020 CLINICAL DATA:  Head trauma, minor. Facial trauma. Neck trauma.  Additional history provided: Fall, striking back of head. EXAM: CT HEAD WITHOUT CONTRAST CT MAXILLOFACIAL WITHOUT CONTRAST CT CERVICAL SPINE WITHOUT CONTRAST TECHNIQUE: Multidetector CT imaging of the head, cervical spine, and maxillofacial structures were performed using the standard protocol without intravenous contrast. Multiplanar CT image reconstructions of the cervical spine and maxillofacial structures were also generated. COMPARISON:  Prior head CT 03/08/2020. Radiographs of the cervical spine 12/23/2006. Maxillofacial CT 12/24/2016. FINDINGS: CT HEAD FINDINGS Brain: Generalized cerebral atrophy. Mild ill-defined hypoattenuation within cerebral white matter is nonspecific, but consistent with chronic small vessel ischemic disease. There is no acute intracranial hemorrhage. No demarcated cortical infarct. No extra-axial fluid collection. No evidence of intracranial mass. No midline shift. Vascular: No hyperdense vessel.  Atherosclerotic calcifications. Skull: Normal. Negative for fracture or focal lesion. Other: No significant mastoid effusion. CT MAXILLOFACIAL FINDINGS Osseous: There is a mildly displaced fracture deformity of the left nasal bone which is new as compared to the prior maxillofacial CT of 12/24/2016 and may be acute. Elsewhere, no acute maxillofacial fracture is identified. Orbits: Acute finding. Redemonstrated anterior globe calcifications bilaterally. The globes are normal in size and contour. The  extraocular muscles and optic nerve sheath complexes are symmetric and unremarkable. Sinuses: Minimal ethmoid sinus mucosal thickening. Soft tissues: Subtle soft tissue swelling is questioned overlying the nose. CT CERVICAL SPINE FINDINGS Alignment: Straightening of the expected cervical lordosis. 2 mm C7-T1 grade 1 anterolisthesis. Skull base and vertebrae: The basion-dental and atlanto-dental intervals are maintained.No evidence of acute fracture to the cervical spine. Soft tissues and spinal canal: No prevertebral fluid or swelling. No visible canal hematoma. Disc levels: C3-C4 and C4-C5 vertebral fusion. Cervical spondylosis with multilevel disc space narrowing at the remaining levels, disc bulges, posterior disc osteophytes, uncovertebral hypertrophy and facet arthrosis. No high-grade bony spinal canal stenosis. Multilevel bony neural foraminal narrowing. Upper chest: No consolidation within the imaged lung apices. No visible pneumothorax. IMPRESSION: CT head: 1. No evidence of acute intracranial abnormality. 2. Generalized cerebral atrophy and mild chronic small vessel ischemic disease, stable as compared to the head CT of 03/08/2020. CT maxillofacial: 1. A mildly displaced fracture deformity of the left nasal bone is new as compared to the maxillofacial CT of 12/24/2016, and may be acute. Clinical correlation is recommended. Question mild overlying soft tissue swelling. 2. Minimal ethmoid sinus mucosal thickening. CT cervical spine: 1. No evidence of acute fracture to the cervical spine. 2. 2 mm C7-T1 grade 1 anterolisthesis. 3. C3-C4 and C4-C5 vertebral fusion. 4. Cervical spondylosis as described. Electronically Signed   By: Jackey Loge DO   On: 07/28/2020 11:15   CT Cervical Spine Wo Contrast  Result Date: 07/28/2020 CLINICAL DATA:  Head trauma, minor. Facial trauma. Neck trauma. Additional history provided: Fall, striking back of head. EXAM: CT HEAD WITHOUT CONTRAST CT MAXILLOFACIAL WITHOUT CONTRAST  CT CERVICAL SPINE WITHOUT CONTRAST TECHNIQUE: Multidetector CT imaging of the head, cervical spine, and maxillofacial structures were performed using the standard protocol without intravenous contrast. Multiplanar CT image reconstructions of the cervical spine and maxillofacial structures were also generated. COMPARISON:  Prior head CT 03/08/2020. Radiographs of the cervical spine 12/23/2006. Maxillofacial CT 12/24/2016. FINDINGS: CT HEAD FINDINGS Brain: Generalized cerebral atrophy. Mild ill-defined hypoattenuation within cerebral white matter is nonspecific, but consistent with chronic small vessel ischemic disease. There is no acute intracranial hemorrhage. No demarcated cortical infarct. No extra-axial fluid collection. No evidence of intracranial mass. No midline shift. Vascular: No hyperdense vessel.  Atherosclerotic calcifications. Skull: Normal. Negative for fracture or focal lesion. Other:  No significant mastoid effusion. CT MAXILLOFACIAL FINDINGS Osseous: There is a mildly displaced fracture deformity of the left nasal bone which is new as compared to the prior maxillofacial CT of 12/24/2016 and may be acute. Elsewhere, no acute maxillofacial fracture is identified. Orbits: Acute finding. Redemonstrated anterior globe calcifications bilaterally. The globes are normal in size and contour. The extraocular muscles and optic nerve sheath complexes are symmetric and unremarkable. Sinuses: Minimal ethmoid sinus mucosal thickening. Soft tissues: Subtle soft tissue swelling is questioned overlying the nose. CT CERVICAL SPINE FINDINGS Alignment: Straightening of the expected cervical lordosis. 2 mm C7-T1 grade 1 anterolisthesis. Skull base and vertebrae: The basion-dental and atlanto-dental intervals are maintained.No evidence of acute fracture to the cervical spine. Soft tissues and spinal canal: No prevertebral fluid or swelling. No visible canal hematoma. Disc levels: C3-C4 and C4-C5 vertebral fusion. Cervical  spondylosis with multilevel disc space narrowing at the remaining levels, disc bulges, posterior disc osteophytes, uncovertebral hypertrophy and facet arthrosis. No high-grade bony spinal canal stenosis. Multilevel bony neural foraminal narrowing. Upper chest: No consolidation within the imaged lung apices. No visible pneumothorax. IMPRESSION: CT head: 1. No evidence of acute intracranial abnormality. 2. Generalized cerebral atrophy and mild chronic small vessel ischemic disease, stable as compared to the head CT of 03/08/2020. CT maxillofacial: 1. A mildly displaced fracture deformity of the left nasal bone is new as compared to the maxillofacial CT of 12/24/2016, and may be acute. Clinical correlation is recommended. Question mild overlying soft tissue swelling. 2. Minimal ethmoid sinus mucosal thickening. CT cervical spine: 1. No evidence of acute fracture to the cervical spine. 2. 2 mm C7-T1 grade 1 anterolisthesis. 3. C3-C4 and C4-C5 vertebral fusion. 4. Cervical spondylosis as described. Electronically Signed   By: Jackey Loge DO   On: 07/28/2020 11:15   CT Lumbar Spine Wo Contrast  Result Date: 07/28/2020 CLINICAL DATA:  Fall.  L1 compression fracture. EXAM: CT LUMBAR SPINE WITHOUT CONTRAST TECHNIQUE: Multidetector CT imaging of the lumbar spine was performed without intravenous contrast administration. Multiplanar CT image reconstructions were also generated. COMPARISON:  Previous CT 03/08/2020. Lumbar radiography earlier same day. FINDINGS: Segmentation: 5 lumbar type vertebral bodies Alignment: Thoracolumbar curvature convex to the right and lower lumbar curvature convex to the left. Leftward translation of L4 relative to L5 of 1 cm. Anterolisthesis of 4 mm at L3-4. Anterolisthesis of 12 mm at L4-5. Vertebrae: Compression fracture at L1 with loss of height of 60%. Posterior bowing of the posterosuperior margin of the vertebral body. I favor that this is late subacute and essentially healed, though it  was not present on 03/08/2020. No discrete fracture line. No paravertebral soft tissue swelling. Chronic discogenic endplate changes at L4 and L5 especially on the right. No acute regional fracture is seen. Paraspinal and other soft tissues: Aortic atherosclerosis. 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 Disc levels: T12-L1: Posterior bowing of the posterosuperior margin of the vertebral body indents the thecal sac slightly but does not cause neural compressive stenosis. L1-2: Normal interspace. L2-3: Normal interspace. L3-4: Bilateral facet arthropathy with 4 mm of anterolisthesis. Bulging of the disc. Stenosis of both lateral recesses and of the intervertebral foramen on the right. L4-5: Anterolisthesis of 12 mm due to chronic facet arthropathy. Protrusion of the disc. Severe stenosis of the canal and foramina, right worse than left. L5-S1: Bulging of the disc. Mild facet degeneration. No canal stenosis. Mild bilateral foraminal narrowing. IMPRESSION: 1. Compression fracture at L1 with loss of height of  60%. Posterior bowing of the posterosuperior margin of the vertebral body. I favor that this is late subacute and essentially healed, though it was not present on 03/08/2020. No discrete fracture line. No paravertebral soft tissue swelling. 2. L3-4: Bilateral facet arthropathy with 4 mm of anterolisthesis. Bulging of the disc. Stenosis of both lateral recesses and of the intervertebral foramen on the right. Findings could cause neural compression on the right. 3. L4-5: Anterolisthesis of 12 mm due to chronic facet arthropathy. Protrusion of the disc. Severe stenosis of the canal and foramina, right worse than left. Neural compression quite possible at this level. 4. L5-S1: Bulging of the disc. Mild facet degeneration. Mild bilateral foraminal narrowing. 5. 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 6. Aortic atherosclerosis. Aortic Atherosclerosis (ICD10-I70.0).  Electronically Signed   By: Paulina Fusi M.D.   On: 07/28/2020 12:07   CT Maxillofacial Wo Contrast  Result Date: 07/28/2020 CLINICAL DATA:  Head trauma, minor. Facial trauma. Neck trauma. Additional history provided: Fall, striking back of head. EXAM: CT HEAD WITHOUT CONTRAST CT MAXILLOFACIAL WITHOUT CONTRAST CT CERVICAL SPINE WITHOUT CONTRAST TECHNIQUE: Multidetector CT imaging of the head, cervical spine, and maxillofacial structures were performed using the standard protocol without intravenous contrast. Multiplanar CT image reconstructions of the cervical spine and maxillofacial structures were also generated. COMPARISON:  Prior head CT 03/08/2020. Radiographs of the cervical spine 12/23/2006. Maxillofacial CT 12/24/2016. FINDINGS: CT HEAD FINDINGS Brain: Generalized cerebral atrophy. Mild ill-defined hypoattenuation within cerebral white matter is nonspecific, but consistent with chronic small vessel ischemic disease. There is no acute intracranial hemorrhage. No demarcated cortical infarct. No extra-axial fluid collection. No evidence of intracranial mass. No midline shift. Vascular: No hyperdense vessel.  Atherosclerotic calcifications. Skull: Normal. Negative for fracture or focal lesion. Other: No significant mastoid effusion. CT MAXILLOFACIAL FINDINGS Osseous: There is a mildly displaced fracture deformity of the left nasal bone which is new as compared to the prior maxillofacial CT of 12/24/2016 and may be acute. Elsewhere, no acute maxillofacial fracture is identified. Orbits: Acute finding. Redemonstrated anterior globe calcifications bilaterally. The globes are normal in size and contour. The extraocular muscles and optic nerve sheath complexes are symmetric and unremarkable. Sinuses: Minimal ethmoid sinus mucosal thickening. Soft tissues: Subtle soft tissue swelling is questioned overlying the nose. CT CERVICAL SPINE FINDINGS Alignment: Straightening of the expected cervical lordosis. 2 mm C7-T1  grade 1 anterolisthesis. Skull base and vertebrae: The basion-dental and atlanto-dental intervals are maintained.No evidence of acute fracture to the cervical spine. Soft tissues and spinal canal: No prevertebral fluid or swelling. No visible canal hematoma. Disc levels: C3-C4 and C4-C5 vertebral fusion. Cervical spondylosis with multilevel disc space narrowing at the remaining levels, disc bulges, posterior disc osteophytes, uncovertebral hypertrophy and facet arthrosis. No high-grade bony spinal canal stenosis. Multilevel bony neural foraminal narrowing. Upper chest: No consolidation within the imaged lung apices. No visible pneumothorax. IMPRESSION: CT head: 1. No evidence of acute intracranial abnormality. 2. Generalized cerebral atrophy and mild chronic small vessel ischemic disease, stable as compared to the head CT of 03/08/2020. CT maxillofacial: 1. A mildly displaced fracture deformity of the left nasal bone is new as compared to the maxillofacial CT of 12/24/2016, and may be acute. Clinical correlation is recommended. Question mild overlying soft tissue swelling. 2. Minimal ethmoid sinus mucosal thickening. CT cervical spine: 1. No evidence of acute fracture to the cervical spine. 2. 2 mm C7-T1 grade 1 anterolisthesis. 3. C3-C4 and C4-C5 vertebral fusion. 4. Cervical spondylosis as described.  Electronically Signed   By: Jackey Loge DO   On: 07/28/2020 11:15    EKG: Independently reviewed. Sinus rhythm, no acute ST-T changes  Assessment/Plan Active Problems:   Fall at home, initial encounter   Fall  (please populate well all problems here in Problem List. (For example, if patient is on BP meds at home and you resume or decide to hold them, it is a problem that needs to be her. Same for CAD, COPD, HLD and so on)  Fall and acute on chronic ambulation dysfunction -Suspect this is multifactorial effect from recommendation of UTI, hypokalemia, polypharmacy of benzo and narcotic. -Discontinue  Ativan -Patient claims that she has been on fentanyl patch for several years, will consult palliative care for helping [place] with pain management issue. -Treat UTI with ceftriaxone and urine culture sent  Lumbar spine fracture and chronic stenosis -Given her age and comorbidities such as poor nutrition status, she is probably a poor surgical candidate for any types of back surgery.  Conservative management -PT evaluation  Hypokalemia -Replace and recheck -Mg and Phos level  Severe protein calorie malnutrition -Trial of Marinol -Consult dietitian  Uncontrolled hypertension -Continue home BP regimen, add hydralazine, hold clonidine for fall  DVT prophylaxis: Lovenox Code Status: Patient desires full code Family Communication: Daughter over the phone Disposition Plan: Expect more than 2 midnight hospital stay for correction of hypokalemia and physical therapy evaluation, and nutrition status, pain control Consults called: Palliative care Admission status: MedSurg admission   Emeline General MD [facility] Pager [phone]  07/28/2020, 2:34 PM

## 2020-07-28 NOTE — ED Notes (Signed)
Pt transported to CT ?

## 2020-07-28 NOTE — Consult Note (Signed)
Consultation Note Date: 07/28/2020   Patient Name: Claudia [name]  DOB: [birth year]  MRN: [id number]  Age / Sex: 86 y.o., female  PCP: Hoyt Koch, MD Referring Physician: Lequita Halt, MD  Reason for Consultation: Establishing goals of care and Pain control  HPI/Patient Profile: 86 y.o. female  with past medical history of HTN, hyperlipidemia, osteoporosis, GERD, chronic back pain, long-term narcotic dependence. She presented to [facility] on 07/28/2020 with a fall. Per intake H&P, patient had taken Ativan, which was recently prescribed by her PCP for difficulty sleeping. Daughter reported that patient appeared to be confused and was having urinary frequency the day before.  ED Course: CT head negative for injuries, maxillary and facial CT showed acute mild displaced left nasal fracture. CT lumbar spine shows subacute L1 compression fracture, spinal stenosis (L3-4 and L4-5), and bulging disc ((L3-4 and L5-S1). Urinalysis shows large leukocytes and positive nitrite. Potassium 2.9 Admitted to [facility] for management of fall, UTI, hypokalemia, lumbar spine fracture, and uncontrolled HTN.   Primary decision maker: Patient has capacity to make her own decisions. Surrogate decision maker would be Claudia [name] (daughter) [phone]  Clinical Assessment and Goals of Care: I have reviewed medical records including EPIC notes, labs and imaging, and met at bedside with patient  to discuss diagnosis, prognosis, GOC, EOL wishes, disposition, and options. There was no family at bedside, so when I finished speaking with patient I called her daughter Claudia [name].   I introduced Palliative Medicine as specialized medical care for people living with serious illness. It focuses on providing relief from the symptoms and stress of a serious illness.   We discussed a brief life review of the patient. She is long retired, used to work in the  lab. Claudia [name] is her oldest daughter, she lives 10 minutes away from patient - she is very involved; checks on her daily and buys groceries. Patient lives in her home and is primary caregiver for her adopted daughter Claudia [name]; who is mentally challenged and requires 24/7 care. Patient also has 3 sons who all lives out of state. Her husband passed away in 12/19/2004.   As far as functional status, there seems to be a decline although the timing is unclear. Patient ambulates with a walker. She performs her own ADLs and prepares meals for herself and Claudia [name]. However, chart review shows an ED visit for fall in May of this year. Her nutritional status also seems to have declined; her PCP noted poor appetite and weight loss at an office visit in June.   We discussed her current illness and what it means in the larger context of her ongoing co-morbidities.  Natural disease trajectory of chronic illness and chronic pain was discussed. She has not been sleeping well for several months due to pain.  I attempted to elicit values and goals of care important to the patient. Patient's goal is to return home so she can continue her role as caregiver to Claudia [name]. She also wants to have improved pain relief - she states the  fentanyl patch does not provide relief any longer. She reports constant severe pain.   The difference between aggressive medical intervention and comfort care was discussed. Patient and daughter are clear that they do not want any aggressive or life-prolonging measures.    Advanced directives, concepts specific to code status, artifical feeding and hydration, and rehospitalization were considered and discussed.  I completed a MOST form today. The patient outlined her wishes for the following treatment decisions:  Cardiopulmonary Resuscitation: Do Not Attempt Resuscitation (DNR/No CPR)  Medical Interventions: Comfort Measures: Keep clean, warm, and dry. Use medication by any route, positioning, wound care, and other  measures to relieve pain and suffering. Use oxygen, suction and manual treatment of airway obstruction as needed for comfort. Do not transfer to the hospital unless comfort needs cannot be met in current location.  Antibiotics: Antibiotics if indicated  IV Fluids: IV fluids for a defined trial period  Feeding Tube: No feeding tube   Hospice and Palliative Care services outpatient were explained and offered. Patient was receiving outpatient palliative care with [facility] in the past; last visit was October 2020.   Questions and concerns were addressed.  The family was encouraged to call with questions or concerns.     SUMMARY OF RECOMMENDATIONS   - code status changed to DNR (patient has a durable DNR at home) - MOST form completed - spiritual care consult for HCPOA documents - continue fentanyl duragesic patch for now  - recommend outpatient palliative care at discharge  Patient has a long and complex history of chronic pain; PMT will have additional recommendations regarding pain management after additional chart review and speaking with patient's PCP  Code Status/Advance Care Planning:  DNR   Palliative Prophylaxis:   Bowel Regimen, Frequent Pain Assessment and Turn Reposition   Psycho-social/Spiritual:   Desire for further Chaplaincy support:yes  Created space and opportunity for patient and family to express thoughts and feelings regarding patient's current medical situation.   Emotional support provided   Prognosis:   Unable to determine  Discharge Planning: To Be Determined      Primary Diagnoses: Present on Admission: . Fall   I have reviewed the medical record, interviewed the patient and family, and examined the patient. The following aspects are pertinent.  Past Medical History:  Diagnosis Date  . Back pain   . Cataracts, both eyes   . CHF (congestive heart failure) ([place])   . Chronic narcotic use   . Chronic pain   . Coronary artery disease   .  Hyperlipidemia   . Hypertension   . Mitral valve prolapse   . Osteoarthritis   . Osteoporosis   . Pain management   . Reflux     Scheduled Meds: . diltiazem  240 mg Oral Daily  . docusate sodium  100 mg Oral Daily  . dronabinol  2.5 mg Oral BID AC  . enoxaparin (LOVENOX) injection  30 mg Subcutaneous Q24H  . feeding supplement  237 mL Oral BID BM  . fentaNYL  1 patch Transdermal Q48H  . Lifitegrast  1 drop Both Eyes Daily  . melatonin  3 mg Oral QHS  . pantoprazole  40 mg Oral Daily  . potassium chloride  40 mEq Oral Q2H  . simvastatin  20 mg Oral q1800  . triamterene-hydrochlorothiazide  1 tablet Oral Daily   Continuous Infusions: . sodium chloride    . [START ON 07/29/2020] cefTRIAXone (ROCEPHIN)  IV     PRN Meds:.acetaminophen, hydrALAZINE, ondansetron **OR** ondansetron (ZOFRAN)  IV, polyethylene glycol  Medications Prior to Admission:  Prior to Admission medications   Medication Sig Start Date End Date Taking? Authorizing Provider  acetaminophen (TYLENOL) 500 MG tablet Take 1,000 mg by mouth every 6 (six) hours as needed for moderate pain.    [provider]  BESIVANCE 0.6 % SUSP  03/16/20   [provider]  cloNIDine (CATAPRES) 0.1 MG tablet TAKE 1 TABLET DAILY FOR BLOOD PRESSURE>180 OR >100. 07/05/18   Hoyt Koch, MD  diltiazem (CARDIZEM CD) 120 MG 24 hr capsule TAKE 2 CAPSULES DAILY. 05/03/20   Hoyt Koch, MD  docusate sodium 100 MG CAPS Take 100 mg by mouth daily. 12/15/13   Seward Carol, MD  fentaNYL (DURAGESIC) 100 MCG/HR APPLY 1 PATCH EVERY OTHER DAY AS DIRECTED 07/10/20   Biagio Borg, MD  LORazepam (ATIVAN) 0.5 MG tablet Take 1 tablet (0.5 mg total) by mouth at bedtime. 07/26/20   Marrian Salvage, FNP  pantoprazole (PROTONIX) 40 MG tablet TAKE 1 TABLET EACH DAY. 05/03/20   Hoyt Koch, MD  polyethylene glycol powder (GLYCOLAX/MIRALAX) powder TAKE 17 GRAMS TWICE A DAY AS NEEDED Patient taking differently: Take 1  Container by mouth daily as needed for mild constipation.  09/04/17   Hoyt Koch, MD  PROLENSA 0.07 % SOLN  03/16/20   [provider]  simvastatin (ZOCOR) 20 MG tablet TAKE 1 TABLET IN THE P.M. 05/03/20   Hoyt Koch, MD  triamterene-hydrochlorothiazide [facility]) 37.5-25 MG tablet TAKE 1 TABLET EACH DAY. 05/03/20   Hoyt Koch, MD  XIIDRA 5 % SOLN Place 1 drop into both eyes in the morning and at bedtime.  02/05/20   [provider]   Review of Systems  Musculoskeletal: Positive for back pain.    Physical Exam Vitals reviewed.  Constitutional:      General: She is not in acute distress.    Appearance: She is underweight.  HENT:     Head: Normocephalic and atraumatic.  Cardiovascular:     Rate and Rhythm: Normal rate and regular rhythm.  Pulmonary:     Effort: Pulmonary effort is normal.  Neurological:     Mental Status: She is alert and oriented to person, place, and time.  Psychiatric:        Behavior: Behavior normal.     Vital Signs: BP (!) 178/86   Pulse 87   Temp 98 F (36.7 C) (Oral)   Resp (!) 21   Wt 47.2 kg   SpO2 100%   BMI 21.01 kg/m  Pain Scale: 0-10   Pain Score: 7    SpO2: SpO2: 100 % O2 Device:SpO2: 100 % O2 Flow Rate: .   IO: Intake/output summary:   Intake/Output Summary (Last 24 hours) at 07/28/2020 1554 Last data filed at 07/28/2020 1534 Gross per 24 hour  Intake 1100 ml  Output --  Net 1100 ml    LBM:   Baseline Weight: Weight: 47.2 kg Most recent weight: Weight: 47.2 kg      Palliative Assessment/Data: 50%     Time In: 16:00 Time Out: 17:15 Time Total: 75 minutes Greater than 50%  of this time was spent counseling and coordinating care related to the above assessment and plan.  Signed by: Lavena Bullion, NP   Please contact Palliative Medicine Team phone at [phone] for questions and concerns.  For individual provider: See Shea Evans

## 2020-07-29 ENCOUNTER — Telehealth: Payer: Self-pay | Admitting: Internal Medicine

## 2020-07-29 DIAGNOSIS — M545 Low back pain, unspecified: Secondary | ICD-10-CM

## 2020-07-29 DIAGNOSIS — Z7189 Other specified counseling: Secondary | ICD-10-CM

## 2020-07-29 DIAGNOSIS — G8929 Other chronic pain: Secondary | ICD-10-CM

## 2020-07-29 DIAGNOSIS — Z66 Do not resuscitate: Secondary | ICD-10-CM

## 2020-07-29 DIAGNOSIS — Z789 Other specified health status: Secondary | ICD-10-CM

## 2020-07-29 DIAGNOSIS — Z515 Encounter for palliative care: Secondary | ICD-10-CM

## 2020-07-29 DIAGNOSIS — Y92009 Unspecified place in unspecified non-institutional (private) residence as the place of occurrence of the external cause: Secondary | ICD-10-CM

## 2020-07-29 LAB — BASIC METABOLIC PANEL
Anion gap: 13 (ref 5–15)
BUN: 15 mg/dL (ref 8–23)
CO2: 20 mmol/L — ABNORMAL LOW (ref 22–32)
Calcium: 8.6 mg/dL — ABNORMAL LOW (ref 8.9–10.3)
Chloride: 106 mmol/L (ref 98–111)
Creatinine, Ser: 0.85 mg/dL (ref 0.44–1.00)
GFR, Estimated: >60 mL/min (ref >60)
Glucose, Bld: 71 mg/dL (ref 70–99)
Potassium: 3.1 mmol/L — ABNORMAL LOW (ref 3.5–5.1)
Sodium: 139 mmol/L (ref 135–145)

## 2020-07-29 LAB — URINE CULTURE

## 2020-07-29 LAB — PHOSPHORUS: Phosphorus: 3.4 mg/dL (ref 2.5–4.6)

## 2020-07-29 LAB — MAGNESIUM: Magnesium: 2.1 mg/dL (ref 1.7–2.4)

## 2020-07-29 LAB — TSH: TSH: 0.511 u[IU]/mL (ref 0.350–4.500)

## 2020-07-29 MED ORDER — LIFITEGRAST 5 % OP SOLN: 1.0000 [drp] | Freq: Every day | OPHTHALMIC | Status: DC

## 2020-07-29 MED ORDER — ACETAMINOPHEN 325 MG PO TABS
650.0000 mg | ORAL_TABLET | Freq: Three times a day (TID) | ORAL | Status: DC
  Administered 2020-07-29 – 2020-08-02 (×11): 650 mg via ORAL
  Filled 2020-07-29 (×13): qty 2

## 2020-07-29 MED ORDER — LIDOCAINE 5 % EX PTCH
2.0000 | MEDICATED_PATCH | CUTANEOUS | Status: DC
  Administered 2020-07-29 – 2020-08-02 (×5): 2 via TRANSDERMAL
  Filled 2020-07-29 (×6): qty 2

## 2020-07-29 MED ORDER — SENNOSIDES-DOCUSATE SODIUM 8.6-50 MG PO TABS
2.0000 | ORAL_TABLET | Freq: Every day | ORAL | Status: DC
  Administered 2020-07-29 – 2020-08-01 (×4): 2 via ORAL
  Filled 2020-07-29 (×4): qty 2

## 2020-07-29 MED ORDER — GATIFLOXACIN 0.5 % OP SOLN
1.0000 [drp] | Freq: Three times a day (TID) | OPHTHALMIC | Status: DC
  Administered 2020-07-29: 1 [drp] via OPHTHALMIC
  Filled 2020-07-29: qty 2.5

## 2020-07-29 MED ORDER — KETOROLAC TROMETHAMINE 0.5 % OP SOLN
1.0000 [drp] | Freq: Every day | OPHTHALMIC | Status: DC
  Filled 2020-07-29: qty 5

## 2020-07-29 MED ORDER — CALCITONIN (SALMON) 200 UNIT/ACT NA SOLN
1.0000 | Freq: Every day | NASAL | Status: DC
  Administered 2020-07-29 – 2020-08-02 (×5): 1 via NASAL
  Filled 2020-07-29 (×2): qty 3.7

## 2020-07-29 MED ORDER — POTASSIUM CHLORIDE CRYS ER 20 MEQ PO TBCR
30.0000 meq | EXTENDED_RELEASE_TABLET | ORAL | Status: AC
  Administered 2020-07-29 (×2): 30 meq via ORAL
  Filled 2020-07-29 (×2): qty 1

## 2020-07-29 MED ORDER — MIRTAZAPINE 15 MG PO TABS
7.5000 mg | ORAL_TABLET | Freq: Every day | ORAL | Status: DC
  Administered 2020-07-29 – 2020-07-30 (×2): 7.5 mg via ORAL
  Filled 2020-07-29 (×2): qty 1

## 2020-07-29 NOTE — Progress Notes (Signed)
This chaplain responded to PMT consult to complete Pt. HCPOA.  The Pt. is awake and alert. Holding conversations with multiple members of healthcare team at the same time.  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 Daughter is a Charity fundraiser at [employer].  The Pt. shares family stories with the chaplain.  The chaplain understands the Pt. daughter-[name] will be the Pt. Healthcare agent and ongoing discussions are occurring with [name] about the Pt. care. The Pt. plans to talk to [facility] about adding a second person to the document.  The incomplete document was left in the Pt. Room.   The chaplain learned the Pt. role of  primary caregiver of her adopted adult daughter-Thea, with assistance, is very important to the Pt. The chaplain understands the Pt. does not have a caregiver, nor does the Pt. Verbalize she needs one.  The Pt. accepted F/U spiritual care and revisiting the the HCPOA.

## 2020-07-29 NOTE — Telephone Encounter (Signed)
Verbal orders given to Stacy 

## 2020-07-29 NOTE — Progress Notes (Signed)
Patient and daughter Claudia [name] state that patient is no longer taking Xiidra eye drops since having her eye surgery and she's on 2 other drops. I contacted Dr A Martinek's office and they have no record of drops being given besides Benay Spice and state the patient had surgery at [facility]. I spoke with Heather at the above and she states patient was given Besivance 0.6% and Prolensa 0.07% for post op, however the patient should no longer be taking these medications. Will update daughter and have her contact the ophthalmologic office.

## 2020-07-29 NOTE — Progress Notes (Signed)
Patient confirms her DNR status. DNR bracelet applied.

## 2020-07-29 NOTE — TOC CAGE-AID Note (Signed)
Transition of Care [facility]) - CAGE-AID Screening   Patient Details  Name: Claudia [name] MRN: [id number] Date of Birth: Jul 30, 1931  Transition of Care [facility]) CM/SW Contact:    Emeterio Reeve, [place] Phone Number: 07/29/2020, 4:34 PM   Clinical Narrative:  CSW met with pt at bedside. CSW introduced self and explained role at the hospital.  Pt denies alcohol use. Pt denies substance use. Pt did not need any resources at this time.    CAGE-AID Screening:    Have You Ever Felt You Ought to Cut Down on Your Drinking or Drug Use?: No Have People Annoyed You By Critizing Your Drinking Or Drug Use?: No Have You Felt Bad Or Guilty About Your Drinking Or Drug Use?: No Have You Ever Had a Drink or Used Drugs First Thing In The Morning to Steady Your Nerves or to Get Rid of a Hangover?: No CAGE-AID Score: 0  Substance Abuse Education Offered: Yes    Claudia [name], [place] Social Worker [phone]
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
## 2020-07-29 NOTE — Evaluation (Signed)
Physical Therapy Evaluation Patient Details Name: Claudia [name] MRN: [id number] DOB: 1931-03-21 Today's Date: 07/29/2020   History of Present Illness  Claudia [name] is a 86 y.o. female with medical history significant of HTN, chronic narcotic dependence, chronic back pain, hyperlipidemia, osteoporosis, GERD, presented with multiple falls.  Patient baseline ambulation dysfunction, depends on Claudia [name], and she lives by herself with a disabled 84 year old adopted daughter who needs around-the-clock care, patient does have home health aide daytime and her other daughter stops by in the evenings. Patient had 3 falls on 10/19 with no injuries resulting.   Clinical Impression  PTA, patient was living with disabled daughter and independent with rollator. Patient's daughter reports that she has been getting confused and forgetful at times. Patient is impulsive and has decreased safety awareness. Patient ambulated 89' with RW and min guard, however minA required when patient attempted to sit on [facility] but was not in proximity of it when she began to sit down. Educated patient on need for awareness when ambulating to avoid falls and injuries. Patient will benefit from skilled PT services to address deficits listed below. Recommend SNF following discharge to maximize functional independence.    Follow Up Recommendations SNF;Supervision/Assistance - 24 hour    Equipment Recommendations   (defer to post acute rehab)    Recommendations for Other Services       Precautions / Restrictions Precautions Precautions: Fall Restrictions Weight Bearing Restrictions: No      Mobility  Bed Mobility Overal bed mobility: Needs Assistance Bed Mobility: Supine to Sit;Sit to Supine     Supine to sit: Min assist;HOB elevated Sit to supine: Min assist   General bed mobility comments: required cues for sequencing for supine<>sit    Transfers Overall transfer level: Needs assistance Equipment used: Rolling Claudia [name]  (2 wheeled) Transfers: Sit to/from Stand Sit to Stand: Min guard         General transfer comment: pt impulsive with initiating standing when PT was setting up IV pole and RW, educated pt about safety awarenes  Ambulation/Gait Ambulation/Gait assistance: Min guard Gait Distance (Feet): 15 Feet Assistive device: Rolling Claudia [name] (2 wheeled) Gait Pattern/deviations: Step-to pattern;Decreased stride length;Trunk flexed;Narrow base of support Gait velocity: decreased   General Gait Details: pt ambulates with RW and min guard-minA; impulsive and will sit before chair is behind her  Stairs            Wheelchair Mobility    Modified Rankin (Stroke Patients Only)       Balance Overall balance assessment: Needs assistance Sitting-balance support: Bilateral upper extremity supported;Feet supported Sitting balance-Leahy Scale: Fair   Postural control: Posterior lean Standing balance support: Bilateral upper extremity supported;During functional activity Standing balance-Leahy Scale: Poor                               Pertinent Vitals/Pain Pain Assessment: Faces Faces Pain Scale: Hurts little more Pain Location: back and legs Pain Descriptors / Indicators: Aching;Grimacing;Guarding Pain Intervention(s): Limited activity within patient's tolerance;Monitored during session;Repositioned    Home Living Family/patient expects to be discharged to:: Private residence Living Arrangements: Children (disabled daughter) Available Help at Discharge: Available PRN/intermittently;Family Type of Home: House Home Access: Level entry     Home Layout: One level Home Equipment: Bedside commode;Claudia [name] - 4 wheels;Cane - single point;Shower seat Additional Comments: ambulated with rollator per daughter    Prior Function Level of Independence: Independent with assistive device(s)  Comments: Daughter present; Daughter states she is forgetful and confused at times.  Patient is able to cook however burns food.     Hand Dominance        Extremity/Trunk Assessment   Upper Extremity Assessment Upper Extremity Assessment: Generalized weakness    Lower Extremity Assessment Lower Extremity Assessment: Generalized weakness    Cervical / Trunk Assessment Cervical / Trunk Assessment: Kyphotic  Communication   Communication: No difficulties  Cognition Arousal/Alertness: Awake/alert Behavior During Therapy: Impulsive;Flat affect Overall Cognitive Status: Impaired/Different from baseline Area of Impairment: Attention;Memory;Following commands;Safety/judgement;Awareness                   Current Attention Level: Selective Memory: Decreased short-term memory Following Commands: Follows one step commands consistently;Follows multi-step commands with increased time;Follows multi-step commands inconsistently Safety/Judgement: Decreased awareness of safety;Decreased awareness of deficits Awareness: Intellectual   General Comments: Patient with decreased safety awareness during ambulation this session. While patient was ambulating, she located [facility] and began turning to sit down with her attempting to sit on arm of BSC. Patient was unaware of proximity to [facility] and began to sit, required minA to remain standing and avoid fall.      General Comments General comments (skin integrity, edema, etc.): Daughter present throughout session. Provided insight on patient's recent confusion and memory deficits    Exercises     Assessment/Plan    PT Assessment Patient needs continued PT services  PT Problem List Decreased strength;Decreased activity tolerance;Decreased balance;Decreased mobility;Decreased cognition;Decreased safety awareness;Pain       PT Treatment Interventions Gait training;Functional mobility training;Therapeutic activities;Therapeutic exercise;Balance training;Patient/family education    PT Goals (Current goals can be found in the Care  Plan section)  Acute Rehab PT Goals Patient Stated Goal: to go home PT Goal Formulation: With patient/family Time For Goal Achievement: 08/12/20 Potential to Achieve Goals: Fair    Frequency Min 2X/week   Barriers to discharge        Co-evaluation               AM-PAC PT "6 Clicks" Mobility  Outcome Measure Help needed turning from your back to your side while in a flat bed without using bedrails?: A Little Help needed moving from lying on your back to sitting on the side of a flat bed without using bedrails?: A Little Help needed moving to and from a bed to a chair (including a wheelchair)?: A Little Help needed standing up from a chair using your arms (e.g., wheelchair or bedside chair)?: A Little Help needed to walk in hospital room?: A Little Help needed climbing 3-5 steps with a railing? : A Lot 6 Click Score: 17    End of Session Equipment Utilized During Treatment: Gait belt Activity Tolerance: Patient tolerated treatment well Patient left: in bed;with call bell/phone within reach;with bed alarm set;with nursing/sitter in room Nurse Communication: Mobility status PT Visit Diagnosis: Unsteadiness on feet (R26.81);Other abnormalities of gait and mobility (R26.89);Repeated falls (R29.6);Muscle weakness (generalized) (M62.81);Difficulty in walking, not elsewhere classified (R26.2);Pain Pain - Right/Left:  (back and B LEs)    Time: 6712-4580 PT Time Calculation (min) (ACUTE ONLY): 33 min   Charges:   PT Evaluation $PT Eval Moderate Complexity: 1 Mod PT Treatments $Therapeutic Activity: 8-22 mins        Claudia [name], PT, DPT Acute Rehabilitation Services Pager [phone] Office [phone]   Claudia [name] 07/29/2020, 4:05 PM

## 2020-07-29 NOTE — Progress Notes (Signed)
Daily Progress Note   Patient Name: Claudia [name]       Date: 07/29/2020 DOB: 1931/06/20  Age: 86 y.o. MRN#: [id number] Attending Physician: Lanae Boast, MD Primary Care Physician: [name], MD Admit Date: 07/28/2020  Reason for Consultation/Follow-up: Establishing goals of care  Subjective: Chart review performed. Received report from primary RN - no acute concerns. RN reports patient was able to get up to chair, ate 50% of breakfast, needs encouragement to drink liquids.   Went to visit patient at bedside - no family/visitors present. Patient was lying in bed awake, alert, oriented, and able to participate in conversation. She expresses having discomfort in her neck, which heating pads help. She denies shortness of breath but does endorse insomnia. No signs or non-verbal gestures of pain or discomfort noted. No respiratory distress, increased work of breathing, or secretions noted.    Long and detailed conversation was had with patient today regarding disposition options. We discussed patient's current illness and what it means in the larger context of patient's on-going co-morbidities. Natural disease trajectory and expectations at EOL were discussed. I attempted to elicit values and goals of care important to the patient. The difference between aggressive medical intervention and comfort care was considered in light of the patient's goals of care. Ms. Iten expresses interest in home hospice support; however, states she wants to attempt PT to see if she gains any benefit. Rehab vs HHPT was discussed - patient expresses concern going to rehab because she is the primary caregiver to her adopted daughter/[name] who needs assistance 24/7 at home - Ms. Gendron would prefer HHPT. The patient's  deconditioning and safety in context of caring for [name] was discussed. Ms. Zurn understands that it is important to start developing a plan for [name] to ensure her and [name]'s safety. The patient was open to receiving information on group homes or other resources that could be available to help [name]. Discussed with Ms. Berke that if HHPT was her desire, she may need additional support at home, which could include home health aids - she strongly agreed. We discussed that if she tried PT and did not find it beneficial, she could stop PT and transition to hospice at that time. Outpatient Palliative Care was discussed and offered while she is getting PT - she was agreeable.  Dr. Phillips Odor entered patient's room at end of visit - furthur discussion was had around pain/symptom management plan.  Addressed all questions and concerns. Encouraged to call with questions/concerns. PMT card was provided.   Length of Stay: 1  Current Medications: Scheduled Meds:  . diltiazem  240 mg Oral Daily  . docusate sodium  100 mg Oral Daily  . dronabinol  2.5 mg Oral BID AC  . enoxaparin (LOVENOX) injection  30 mg Subcutaneous Q24H  . feeding supplement  237 mL Oral BID BM  . fentaNYL  1 patch Transdermal Q72H  . Lifitegrast  1 drop Both Eyes Daily  . melatonin  3 mg Oral QHS  . pantoprazole  40 mg Oral Daily  . potassium chloride  30 mEq Oral Q3H  . simvastatin  20 mg Oral q1800  . triamterene-hydrochlorothiazide  1 tablet Oral Daily    Continuous Infusions: . sodium chloride Stopped (07/29/20 0657)  . cefTRIAXone (ROCEPHIN)  IV      PRN Meds: acetaminophen, hydrALAZINE, ondansetron **OR** ondansetron (ZOFRAN) IV, polyethylene glycol  Physical Exam Vitals and nursing note reviewed.  Constitutional:      General: She is not in acute distress. Pulmonary:     Effort: No respiratory distress.  Musculoskeletal:     Cervical back: Pain with movement present.  Skin:    General: Skin is warm and dry.    Neurological:     Mental Status: She is alert and oriented to person, place, and time.     Motor: Weakness present.  Psychiatric:        Attention and Perception: Attention normal.        Behavior: Behavior is cooperative.        Cognition and Memory: Cognition and memory normal.             Vital Signs: BP (!) 164/76   Pulse 84   Temp 98.4 F (36.9 C) (Oral)   Resp 17   Wt 47.2 kg   SpO2 100%   BMI 21.01 kg/m  SpO2: SpO2: 100 % O2 Device: O2 Device: Room Air O2 Flow Rate:    Intake/output summary:   Intake/Output Summary (Last 24 hours) at 07/29/2020 1211 Last data filed at 07/29/2020 0945 Gross per 24 hour  Intake 2040.92 ml  Output 200 ml  Net 1840.92 ml   LBM: Last BM Date: 07/29/20 Baseline Weight: Weight: 47.2 kg Most recent weight: Weight: 47.2 kg       Palliative Assessment/Data: 50%      Patient Active Problem List   Diagnosis Date Noted  . Fall at home, initial encounter 07/28/2020  . Fall 07/28/2020  . Weakness   . Closed fracture of nasal bones   . Acute cystitis without hematuria   . Pain management contract signed 04/08/2020  . Protein-calorie malnutrition, severe (HCC) 03/11/2020  . Hyperlipidemia 12/20/2017  . Routine general medical examination at a health care facility 12/19/2016  . Insomnia 06/19/2015  . Narcotic dependence (HCC) 12/13/2013  . Chronic back pain 12/13/2013  . Hypertension     Palliative Care Assessment & Plan   Patient Profile: 86 y.o. female  with past medical history of HTN, hyperlipidemia, osteoporosis, GERD, chronic back pain, long-term narcotic dependence. She presented to [facility] on 07/28/2020 with a fall. Per intake H&P, patient had taken Ativan, which was recently prescribed by her PCP for difficulty sleeping. Daughter reported that patient appeared to be confused and was having urinary frequency the day before.  ED Course: CT head negative  for injuries, maxillary and facial CT showed acute mild displaced left  nasal fracture. CT lumbar spine shows subacute L1 compression fracture, spinal stenosis (L3-4 and L4-5), and bulging disc ((L3-4 and L5-S1). Urinalysis shows large leukocytes and positive nitrite. Potassium 2.9 Admitted to [facility] for management of fall, UTI, hypokalemia, lumbar spine fracture, and uncontrolled HTN.   Assessment: Fall at home Ambulatory dysfunction Hypokalemia UTI Lumbar spine compression fractures and stenosis Generalized weakness Severe malnutrition   Recommendations/Plan:  Continue current full scope medical treatment  Continue DNR/DNI as previously documented  On discharge, patient would like to return home with HHPT and outpatient Palliative Care to follow  If she does not find PT to be helpful, or if she experiences significant decline, she is open to home hospice transition  Her goal is to not return to the hospital once discharged  She expressed need for additional support and help at home - was agreeable to home health aids  [facility] consult placed for: outpatient Palliative Care with [facility], information on getting home health aid support, information on resources/group homes to help with the care of adopted daughter's 24/7 needs  Started calcitonin 1 spray, alternating nares, daily  Started mirtazapine 7.5mg  tablet, oral, daily at bedtime - to help with insomnia and increase appetite  Started scheduled Tylenol 650mg , oral, TID  Added lidocaine patches (2) to help with lower back pain  Kpad ordered for comfort  PMT will continue to follow holistically   Goals of Care and Additional Recommendations:  Limitations on Scope of Treatment: Full Scope Treatment  Code Status:    Code Status Orders  (From admission, onward)         Start     Ordered   07/29/20 1019  Do not attempt resuscitation (DNR)  Continuous       Question Answer Comment  In the event of cardiac or respiratory ARREST Do not call a "code blue"   In the event of cardiac or  respiratory ARREST Do not perform Intubation, CPR, defibrillation or ACLS   In the event of cardiac or respiratory ARREST Use medication by any route, position, wound care, and other measures to relive pain and suffering. May use oxygen, suction and manual treatment of airway obstruction as needed for comfort.      07/29/20 1018        Code Status History    Date Active Date Inactive Code Status Order ID Comments User Context   07/28/2020 1412 07/29/2020 1018 Full Code 07/31/2020  [id number], MD ED   12/13/2013 1226 12/15/2013 1712 Full Code 02/14/2014  [id number], MD Inpatient   Advance Care Planning Activity       Prognosis:   < 6 months  Discharge Planning:  Home with Palliative Services  Care plan was discussed with primary RN, Dr. Clydia Llano, Dr. Jonathon Bellows, patient, [facility]  Thank you for allowing the Palliative Medicine Team to assist in the care of this patient.   Total Time 40 minutes Prolonged Time Billed  no       Greater than 50%  of this time was spent counseling and coordinating care related to the above assessment and plan.  [facility], NP  Please contact Palliative Medicine Team phone at [phone] for questions and concerns.

## 2020-07-29 NOTE — Telephone Encounter (Signed)
Stacy with [facility] is requesting verbals for palliative care when patient is discharged from the hospital.   Please call Stacy at [phone] Option 2

## 2020-07-29 NOTE — Progress Notes (Signed)
   07/29/20 1342  Assess: MEWS Score  BP 96/68  Pulse Rate (!) 102  SpO2 100 %  Assess: MEWS Score  MEWS Temp 0  MEWS Systolic 1  MEWS Pulse 1  MEWS RR 0  MEWS LOC 0  MEWS Score 2  MEWS Score Color Yellow  Assess: if the MEWS score is Yellow or Red  Were vital signs taken at a resting state? Yes  Focused Assessment No change from prior assessment  Early Detection of Sepsis Score *See Row Information* Low  MEWS guidelines implemented *See Row Information* No, vital signs rechecked  Treat  MEWS Interventions Other (Comment)  Pain Scale 0-10  Pain Score 0  Pain Intervention(s) Medication (See eMAR) (Lidocaine patcj)  Escalate  MEWS: Escalate Yellow: discuss with charge nurse/RN and consider discussing with provider and RRT   No interventions needed. Vital signs rechecked. Pt remained stable.

## 2020-07-29 NOTE — Progress Notes (Signed)
PROGRESS NOTE    MORGEN LINEBAUGH  VHQ:[id number] DOB: [birth year] DOA: 07/28/2020 PCP: [name], MD   Chief Complaint  Patient presents with  . Fall   Brief Narrative: 86 y.o. female with medical history significant of HTN, chronic narcotic dependence, chronic back pain, hyperlipidemia, osteoporosis, GERD, presented with multiple falls.  Patient baseline ambulation dysfunction, depends on walker, and she lives by herself with a disabled 84 year old adopted daughter who needs around-the-clock care, patient does have home health aide daytime and her other daughter stops by in the evenings.  It appears that patient has had trouble for sleep for couple weeks and went to her PCP on Monday, who prescribed patient with Ativan 0.5 mg which patient started to take last night.  Several hours after patient took Ativan she sustained 3 episodes of fall.  Patient described her legs were feeling weak but denied any specifically one-sided weaker than the other.  Her daughter reported over the phone that the patient appeared to be confused last night and before that she has had urinary frequency the whole day yesterday.  Patient denied any dysuria, no fever chills no abdominal pain.  Another problem patient has is she has been losing weight, she has had poor appetite for several months, she denied of feeling nauseous vomiting or abdominal pain.  Patient has cataract on the left eye for which she had surgery 3 months ago and she reported her eyesight has been sufficient. ED Course: CT head negative for injuries, maxillary and facial CT showed acute mild displaced left nasal fracture.  Lumbar spine L1 compression fracture subacute, L3-L4, L4-L5 bulging disc and stenosis of canal and foramina.  Blood work, potassium 2.9.  CK 300  Subjective:  Denies any abdominal pain or back pain.  She is alert awake oriented appears to be at baseline. Afebrile overnight, blood pressure stable intermittent tachycardia last  night  Assessment & Plan:  Fall at home, initial encounter  With acute on chronic ambulatory dysfunction, multifactorial in the setting of UTI deconditioning, old age,polypharmacy with benzo and narcotic.  Patient on 0.5 IM Ativan at bedtime, fentanyl 100 MCG patch every other day. Ativan discontinued on admission.  Being treated for UTI with antibiotics continue the same.  PT OT eval palliative care consult.  Hypokalemia: We will replete and monitor.  UTI: Continue ceftriaxone follow-up culture.  Lumbar spine compression fractures and stenosis : Does not endorse significant pain currently.  Given her age and comorbidities and poor nutritional status likely poor surgical candidate, pain control.  Eval supportive care.  Nutrition: Diet Order            Diet regular Room service appropriate? Yes; Fluid consistency: Thin  Diet effective now                Body mass index is 21.01 kg/m.  DVT prophylaxis: enoxaparin (LOVENOX) injection 30 mg Start: 07/28/20 1415 Code Status:   Code Status: DNR  Family Communication: plan of care discussed with patient at bedside.  Status is: Inpatient Remains inpatient appropriate because:Inpatient level of care appropriate due to severity of illness and Ongoing management of UTI, unsafe dipos, pt ot eval for dispo  Dispo: The patient is from: Home              Anticipated d/c is to: SNF              Anticipated d/c date is: 2 days  Patient currently is not medically stable to d/c.  Consultants:see note  Procedures:see note  Culture/Microbiology    Component Value Date/Time   SDES URINE, RANDOM 07/28/2020 1242   SPECREQUEST  07/28/2020 1242    NONE Performed at [facility], [address], [place], [place] [address]    CULT MULTIPLE SPECIES PRESENT, SUGGEST RECOLLECTION (A) 07/28/2020 1242   REPTSTATUS 07/29/2020 FINAL 07/28/2020 1242    Other culture-see note  Medications: Scheduled Meds: . diltiazem  240 mg Oral  Daily  . docusate sodium  100 mg Oral Daily  . dronabinol  2.5 mg Oral BID AC  . enoxaparin (LOVENOX) injection  30 mg Subcutaneous Q24H  . feeding supplement  237 mL Oral BID BM  . fentaNYL  1 patch Transdermal Q72H  . Lifitegrast  1 drop Both Eyes Daily  . melatonin  3 mg Oral QHS  . pantoprazole  40 mg Oral Daily  . simvastatin  20 mg Oral q1800  . triamterene-hydrochlorothiazide  1 tablet Oral Daily   Continuous Infusions: . sodium chloride Stopped (07/29/20 0657)  . cefTRIAXone (ROCEPHIN)  IV      Antimicrobials: Anti-infectives (From admission, onward)   Start     Dose/Rate Route Frequency Ordered Stop   07/29/20 1300  cefTRIAXone (ROCEPHIN) 1 g in sodium chloride 0.9 % 100 mL IVPB        1 g 200 mL/hr over 30 Minutes Intravenous Every 24 hours 07/28/20 1413     07/28/20 1345  cefTRIAXone (ROCEPHIN) 1 g in sodium chloride 0.9 % 100 mL IVPB        1 g 200 mL/hr over 30 Minutes Intravenous  Once 07/28/20 1344 07/28/20 1533     Objective: Vitals: Today's Vitals   07/29/20 0530 07/29/20 0617 07/29/20 0800 07/29/20 1055  BP:  (!) 154/75 (!) 154/72 (!) 164/76  Pulse:  83 95 84  Resp:  17 17   Temp:  97.6 F (36.4 C) 98.4 F (36.9 C)   TempSrc:  Oral Oral   SpO2:  100% 100% 100%  Weight:      PainSc: Asleep  8      Intake/Output Summary (Last 24 hours) at 07/29/2020 1156 Last data filed at 07/29/2020 0945 Gross per 24 hour  Intake 2040.92 ml  Output 200 ml  Net 1840.92 ml   Filed Weights   07/28/20 0943  Weight: 47.2 kg   Weight change:   Intake/Output from previous day: 10/20 0701 - 10/21 0700 In: 1920.9 [P.O.:120; I.V.:700.9; IV Piggyback:1100] Out: 200 [Urine:200] Intake/Output this shift: Total I/O In: 120 [P.O.:120] Out: -   Examination: General exam: AAO place month/year, current president, elderly ,frail,NAD, weak appearing. HEENT:Oral mucosa moist, Ear/Nose WNL grossly,dentition normal. Respiratory system: bilaterally clear,no wheezing or  crackles,no use of accessory muscle, non tender. Cardiovascular system: S1 & S2 +, regular, No JVD. Gastrointestinal system: Abdomen soft, NT,ND, BS+. Nervous System:Alert, awake, moving extremities and grossly nonfocal Extremities: No edema, distal peripheral pulses palpable.  Skin: No rashes,no icterus. MSK: Normal muscle bulk,tone, power  Data Reviewed: I have personally reviewed following labs and imaging studies CBC: Recent Labs  Lab 07/28/20 1017  WBC 6.9  NEUTROABS 4.8  HGB 13.9  HCT 42.9  MCV 86.8  PLT 274   Basic Metabolic Panel: Recent Labs  Lab 07/28/20 1017 07/28/20 1530 07/29/20 0251  NA 140  --  139  K 2.9*  --  3.1*  CL 102  --  106  CO2 26  --  20*  GLUCOSE 104*  --  71  BUN 15  --  15  CREATININE 0.98  --  0.85  CALCIUM 9.2  --  8.6*  MG  --  2.1  --   PHOS  --  3.4  --    GFR: Estimated Creatinine Clearance: 31.8 mL/min (by C-G formula based on SCr of 0.85 mg/dL). Liver Function Tests: No results for input(s): AST, ALT, ALKPHOS, BILITOT, PROT, ALBUMIN in the last 168 hours. No results for input(s): LIPASE, AMYLASE in the last 168 hours. No results for input(s): AMMONIA in the last 168 hours. Coagulation Profile: No results for input(s): INR, PROTIME in the last 168 hours. Cardiac Enzymes: Recent Labs  Lab 07/28/20 1017  CKTOTAL 324*   BNP (last 3 results) No results for input(s): PROBNP in the last 8760 hours. HbA1C: No results for input(s): HGBA1C in the last 72 hours. CBG: Recent Labs  Lab 07/28/20 0952  GLUCAP 93   Lipid Profile: No results for input(s): CHOL, HDL, LDLCALC, TRIG, CHOLHDL, LDLDIRECT in the last 72 hours. Thyroid Function Tests: Recent Labs    07/28/20 1412  TSH 0.511   Anemia Panel: No results for input(s): VITAMINB12, FOLATE, FERRITIN, TIBC, IRON, RETICCTPCT in the last 72 hours. Sepsis Labs: No results for input(s): PROCALCITON, LATICACIDVEN in the last 168 hours.  Recent Results (from the past 240  hour(s))  Respiratory Panel by RT PCR (Flu A&B, Covid) - Nasopharyngeal Swab     Status: None   Collection Time: 07/28/20 12:10 PM   Specimen: Nasopharyngeal Swab  Result Value Ref Range Status   SARS Coronavirus 2 by RT PCR NEGATIVE NEGATIVE Final    Comment: (NOTE) SARS-CoV-2 target nucleic acids are NOT DETECTED.  The SARS-CoV-2 RNA is generally detectable in upper respiratoy specimens during the acute phase of infection. The lowest concentration of SARS-CoV-2 viral copies this assay can detect is 131 copies/mL. A negative result does not preclude SARS-Cov-2 infection and should not be used as the sole basis for treatment or other patient management decisions. A negative result may occur with  improper specimen collection/handling, submission of specimen other than nasopharyngeal swab, presence of viral mutation(s) within the areas targeted by this assay, and inadequate number of viral copies (<131 copies/mL). A negative result must be combined with clinical observations, patient history, and epidemiological information. The expected result is Negative.  Fact Sheet for Patients:  https://www.moore.com/https://www.fda.gov/media/142436/download  Fact Sheet for Healthcare Providers:  https://www.young.biz/https://www.fda.gov/media/142435/download  This test is no t yet approved or cleared by the [place] FDA and  has been authorized for detection and/or diagnosis of SARS-CoV-2 by FDA under an Emergency Use Authorization (EUA). This EUA will remain  in effect (meaning this test can be used) for the duration of the COVID-19 declaration under Section 564(b)(1) of the Act, 21 U.S.C. section 360bbb-3(b)(1), unless the authorization is terminated or revoked sooner.     Influenza A by PCR NEGATIVE NEGATIVE Final   Influenza B by PCR NEGATIVE NEGATIVE Final    Comment: (NOTE) The Xpert Xpress SARS-CoV-2/FLU/RSV assay is intended as an aid in  the diagnosis of influenza from Nasopharyngeal swab specimens and  should not  be used as a sole basis for treatment. Nasal washings and  aspirates are unacceptable for Xpert Xpress SARS-CoV-2/FLU/RSV  testing.  Fact Sheet for Patients: https://www.moore.com/https://www.fda.gov/media/142436/download  Fact Sheet for Healthcare Providers: https://www.young.biz/https://www.fda.gov/media/142435/download  This test is not yet approved or cleared by the [place] FDA and  has been authorized for detection and/or diagnosis  of SARS-CoV-2 by  FDA under an Emergency Use Authorization (EUA). This EUA will remain  in effect (meaning this test can be used) for the duration of the  Covid-19 declaration under Section 564(b)(1) of the Act, 21  U.S.C. section 360bbb-3(b)(1), unless the authorization is  terminated or revoked. Performed at [facility], [address], [place], [place] [address]   Urine culture     Status: Abnormal   Collection Time: 07/28/20 12:42 PM   Specimen: Urine, Random  Result Value Ref Range Status   Specimen Description URINE, RANDOM  Final   Special Requests   Final    NONE Performed at [facility], [address], [place], [place] [address]    Culture MULTIPLE SPECIES PRESENT, SUGGEST RECOLLECTION (A)  Final   Report Status 07/29/2020 FINAL  Final     Radiology Studies: DG Chest 2 View  Result Date: 07/28/2020 CLINICAL DATA:  Fall, back pain EXAM: CHEST - 2 VIEW COMPARISON:  04/04/2015 FINDINGS: Lungs are clear. No pneumothorax or pleural effusion. Cardiomediastinal silhouette unremarkable. Pulmonary vascularity is normal. Moderate to severe degenerative changes are seen within the thoracic spine, more severe within the midthoracic spine. No acute bone abnormality identified. Right total shoulder arthroplasty has been performed. IMPRESSION: No active cardiopulmonary disease. Electronically Signed   By: Helyn Numbers MD   On: 07/28/2020 10:44   DG Lumbar Spine Complete  Result Date: 07/28/2020 CLINICAL DATA:  Fall, back pain EXAM: LUMBAR SPINE - COMPLETE 4+ VIEW  COMPARISON:  CT abdomen pelvis 03/08/2020 FINDINGS: There is normal lumbar lordosis. There is interval development of a compression fracture of L1 with approximately 50% loss of height. No retropulsion. Stable grade 1 anterolisthesis of L3 upon L4 and L4 upon L5. Stable minimal retrolisthesis of L5 upon S1. Advanced degenerative disc disease is noted at L4-5 with changes of mild degenerative disc disease noted at L3-4 and L5-S1. Remaining intervertebral disc heights and vertebral body height appears preserved. Oblique views demonstrate no evidence of pars defect. Advanced atherosclerotic calcification is seen within the abdominal aorta without evidence of aneurysm. The paraspinal soft tissues are unremarkable. IMPRESSION: Interval L1 compression fracture, age indeterminate but new since 03/08/2020. Correlation for point tenderness would be helpful in determining acuity. MRI examination may also be more helpful in determining the acuity of this fracture. Stable degenerative changes L3-S1, most severe at L4-5. Electronically Signed   By: Helyn Numbers MD   On: 07/28/2020 10:49   CT Head Wo Contrast  Result Date: 07/28/2020 CLINICAL DATA:  Head trauma, minor. Facial trauma. Neck trauma. Additional history provided: Fall, striking back of head. EXAM: CT HEAD WITHOUT CONTRAST CT MAXILLOFACIAL WITHOUT CONTRAST CT CERVICAL SPINE WITHOUT CONTRAST TECHNIQUE: Multidetector CT imaging of the head, cervical spine, and maxillofacial structures were performed using the standard protocol without intravenous contrast. Multiplanar CT image reconstructions of the cervical spine and maxillofacial structures were also generated. COMPARISON:  Prior head CT 03/08/2020. Radiographs of the cervical spine 12/23/2006. Maxillofacial CT 12/24/2016. FINDINGS: CT HEAD FINDINGS Brain: Generalized cerebral atrophy. Mild ill-defined hypoattenuation within cerebral white matter is nonspecific, but consistent with chronic small vessel ischemic  disease. There is no acute intracranial hemorrhage. No demarcated cortical infarct. No extra-axial fluid collection. No evidence of intracranial mass. No midline shift. Vascular: No hyperdense vessel.  Atherosclerotic calcifications. Skull: Normal. Negative for fracture or focal lesion. Other: No significant mastoid effusion. CT MAXILLOFACIAL FINDINGS Osseous: There is a mildly displaced fracture deformity of the left nasal bone which  is new as compared to the prior maxillofacial CT of 12/24/2016 and may be acute. Elsewhere, no acute maxillofacial fracture is identified. Orbits: Acute finding. Redemonstrated anterior globe calcifications bilaterally. The globes are normal in size and contour. The extraocular muscles and optic nerve sheath complexes are symmetric and unremarkable. Sinuses: Minimal ethmoid sinus mucosal thickening. Soft tissues: Subtle soft tissue swelling is questioned overlying the nose. CT CERVICAL SPINE FINDINGS Alignment: Straightening of the expected cervical lordosis. 2 mm C7-T1 grade 1 anterolisthesis. Skull base and vertebrae: The basion-dental and atlanto-dental intervals are maintained.No evidence of acute fracture to the cervical spine. Soft tissues and spinal canal: No prevertebral fluid or swelling. No visible canal hematoma. Disc levels: C3-C4 and C4-C5 vertebral fusion. Cervical spondylosis with multilevel disc space narrowing at the remaining levels, disc bulges, posterior disc osteophytes, uncovertebral hypertrophy and facet arthrosis. No high-grade bony spinal canal stenosis. Multilevel bony neural foraminal narrowing. Upper chest: No consolidation within the imaged lung apices. No visible pneumothorax. IMPRESSION: CT head: 1. No evidence of acute intracranial abnormality. 2. Generalized cerebral atrophy and mild chronic small vessel ischemic disease, stable as compared to the head CT of 03/08/2020. CT maxillofacial: 1. A mildly displaced fracture deformity of the left nasal bone is  new as compared to the maxillofacial CT of 12/24/2016, and may be acute. Clinical correlation is recommended. Question mild overlying soft tissue swelling. 2. Minimal ethmoid sinus mucosal thickening. CT cervical spine: 1. No evidence of acute fracture to the cervical spine. 2. 2 mm C7-T1 grade 1 anterolisthesis. 3. C3-C4 and C4-C5 vertebral fusion. 4. Cervical spondylosis as described. Electronically Signed   By: Jackey Loge DO   On: 07/28/2020 11:15   CT Cervical Spine Wo Contrast  Result Date: 07/28/2020 CLINICAL DATA:  Head trauma, minor. Facial trauma. Neck trauma. Additional history provided: Fall, striking back of head. EXAM: CT HEAD WITHOUT CONTRAST CT MAXILLOFACIAL WITHOUT CONTRAST CT CERVICAL SPINE WITHOUT CONTRAST TECHNIQUE: Multidetector CT imaging of the head, cervical spine, and maxillofacial structures were performed using the standard protocol without intravenous contrast. Multiplanar CT image reconstructions of the cervical spine and maxillofacial structures were also generated. COMPARISON:  Prior head CT 03/08/2020. Radiographs of the cervical spine 12/23/2006. Maxillofacial CT 12/24/2016. FINDINGS: CT HEAD FINDINGS Brain: Generalized cerebral atrophy. Mild ill-defined hypoattenuation within cerebral white matter is nonspecific, but consistent with chronic small vessel ischemic disease. There is no acute intracranial hemorrhage. No demarcated cortical infarct. No extra-axial fluid collection. No evidence of intracranial mass. No midline shift. Vascular: No hyperdense vessel.  Atherosclerotic calcifications. Skull: Normal. Negative for fracture or focal lesion. Other: No significant mastoid effusion. CT MAXILLOFACIAL FINDINGS Osseous: There is a mildly displaced fracture deformity of the left nasal bone which is new as compared to the prior maxillofacial CT of 12/24/2016 and may be acute. Elsewhere, no acute maxillofacial fracture is identified. Orbits: Acute finding. Redemonstrated anterior  globe calcifications bilaterally. The globes are normal in size and contour. The extraocular muscles and optic nerve sheath complexes are symmetric and unremarkable. Sinuses: Minimal ethmoid sinus mucosal thickening. Soft tissues: Subtle soft tissue swelling is questioned overlying the nose. CT CERVICAL SPINE FINDINGS Alignment: Straightening of the expected cervical lordosis. 2 mm C7-T1 grade 1 anterolisthesis. Skull base and vertebrae: The basion-dental and atlanto-dental intervals are maintained.No evidence of acute fracture to the cervical spine. Soft tissues and spinal canal: No prevertebral fluid or swelling. No visible canal hematoma. Disc levels: C3-C4 and C4-C5 vertebral fusion. Cervical spondylosis with multilevel disc space narrowing at the remaining levels,  disc bulges, posterior disc osteophytes, uncovertebral hypertrophy and facet arthrosis. No high-grade bony spinal canal stenosis. Multilevel bony neural foraminal narrowing. Upper chest: No consolidation within the imaged lung apices. No visible pneumothorax. IMPRESSION: CT head: 1. No evidence of acute intracranial abnormality. 2. Generalized cerebral atrophy and mild chronic small vessel ischemic disease, stable as compared to the head CT of 03/08/2020. CT maxillofacial: 1. A mildly displaced fracture deformity of the left nasal bone is new as compared to the maxillofacial CT of 12/24/2016, and may be acute. Clinical correlation is recommended. Question mild overlying soft tissue swelling. 2. Minimal ethmoid sinus mucosal thickening. CT cervical spine: 1. No evidence of acute fracture to the cervical spine. 2. 2 mm C7-T1 grade 1 anterolisthesis. 3. C3-C4 and C4-C5 vertebral fusion. 4. Cervical spondylosis as described. Electronically Signed   By: Jackey Loge DO   On: 07/28/2020 11:15   CT Lumbar Spine Wo Contrast  Result Date: 07/28/2020 CLINICAL DATA:  Fall.  L1 compression fracture. EXAM: CT LUMBAR SPINE WITHOUT CONTRAST TECHNIQUE:  Multidetector CT imaging of the lumbar spine was performed without intravenous contrast administration. Multiplanar CT image reconstructions were also generated. COMPARISON:  Previous CT 03/08/2020. Lumbar radiography earlier same day. FINDINGS: Segmentation: 5 lumbar type vertebral bodies Alignment: Thoracolumbar curvature convex to the right and lower lumbar curvature convex to the left. Leftward translation of L4 relative to L5 of 1 cm. Anterolisthesis of 4 mm at L3-4. Anterolisthesis of 12 mm at L4-5. Vertebrae: Compression fracture at L1 with loss of height of 60%. Posterior bowing of the posterosuperior margin of the vertebral body. I favor that this is late subacute and essentially healed, though it was not present on 03/08/2020. No discrete fracture line. No paravertebral soft tissue swelling. Chronic discogenic endplate changes at L4 and L5 especially on the right. No acute regional fracture is seen. Paraspinal and other soft tissues: Aortic atherosclerosis. 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 Disc levels: T12-L1: Posterior bowing of the posterosuperior margin of the vertebral body indents the thecal sac slightly but does not cause neural compressive stenosis. L1-2: Normal interspace. L2-3: Normal interspace. L3-4: Bilateral facet arthropathy with 4 mm of anterolisthesis. Bulging of the disc. Stenosis of both lateral recesses and of the intervertebral foramen on the right. L4-5: Anterolisthesis of 12 mm due to chronic facet arthropathy. Protrusion of the disc. Severe stenosis of the canal and foramina, right worse than left. L5-S1: Bulging of the disc. Mild facet degeneration. No canal stenosis. Mild bilateral foraminal narrowing. IMPRESSION: 1. Compression fracture at L1 with loss of height of 60%. Posterior bowing of the posterosuperior margin of the vertebral body. I favor that this is late subacute and essentially healed, though it was not present on 03/08/2020. No discrete  fracture line. No paravertebral soft tissue swelling. 2. L3-4: Bilateral facet arthropathy with 4 mm of anterolisthesis. Bulging of the disc. Stenosis of both lateral recesses and of the intervertebral foramen on the right. Findings could cause neural compression on the right. 3. L4-5: Anterolisthesis of 12 mm due to chronic facet arthropathy. Protrusion of the disc. Severe stenosis of the canal and foramina, right worse than left. Neural compression quite possible at this level. 4. L5-S1: Bulging of the disc. Mild facet degeneration. Mild bilateral foraminal narrowing. 5. 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 6. Aortic atherosclerosis. Aortic Atherosclerosis (ICD10-I70.0). Electronically Signed   By: Paulina Fusi M.D.   On: 07/28/2020 12:07   CT Maxillofacial Wo Contrast  Result Date: 07/28/2020 CLINICAL DATA:  Head trauma, minor. Facial trauma. Neck trauma. Additional history provided: Fall, striking back of head. EXAM: CT HEAD WITHOUT CONTRAST CT MAXILLOFACIAL WITHOUT CONTRAST CT CERVICAL SPINE WITHOUT CONTRAST TECHNIQUE: Multidetector CT imaging of the head, cervical spine, and maxillofacial structures were performed using the standard protocol without intravenous contrast. Multiplanar CT image reconstructions of the cervical spine and maxillofacial structures were also generated. COMPARISON:  Prior head CT 03/08/2020. Radiographs of the cervical spine 12/23/2006. Maxillofacial CT 12/24/2016. FINDINGS: CT HEAD FINDINGS Brain: Generalized cerebral atrophy. Mild ill-defined hypoattenuation within cerebral white matter is nonspecific, but consistent with chronic small vessel ischemic disease. There is no acute intracranial hemorrhage. No demarcated cortical infarct. No extra-axial fluid collection. No evidence of intracranial mass. No midline shift. Vascular: No hyperdense vessel.  Atherosclerotic calcifications. Skull: Normal. Negative for fracture or focal lesion. Other: No  significant mastoid effusion. CT MAXILLOFACIAL FINDINGS Osseous: There is a mildly displaced fracture deformity of the left nasal bone which is new as compared to the prior maxillofacial CT of 12/24/2016 and may be acute. Elsewhere, no acute maxillofacial fracture is identified. Orbits: Acute finding. Redemonstrated anterior globe calcifications bilaterally. The globes are normal in size and contour. The extraocular muscles and optic nerve sheath complexes are symmetric and unremarkable. Sinuses: Minimal ethmoid sinus mucosal thickening. Soft tissues: Subtle soft tissue swelling is questioned overlying the nose. CT CERVICAL SPINE FINDINGS Alignment: Straightening of the expected cervical lordosis. 2 mm C7-T1 grade 1 anterolisthesis. Skull base and vertebrae: The basion-dental and atlanto-dental intervals are maintained.No evidence of acute fracture to the cervical spine. Soft tissues and spinal canal: No prevertebral fluid or swelling. No visible canal hematoma. Disc levels: C3-C4 and C4-C5 vertebral fusion. Cervical spondylosis with multilevel disc space narrowing at the remaining levels, disc bulges, posterior disc osteophytes, uncovertebral hypertrophy and facet arthrosis. No high-grade bony spinal canal stenosis. Multilevel bony neural foraminal narrowing. Upper chest: No consolidation within the imaged lung apices. No visible pneumothorax. IMPRESSION: CT head: 1. No evidence of acute intracranial abnormality. 2. Generalized cerebral atrophy and mild chronic small vessel ischemic disease, stable as compared to the head CT of 03/08/2020. CT maxillofacial: 1. A mildly displaced fracture deformity of the left nasal bone is new as compared to the maxillofacial CT of 12/24/2016, and may be acute. Clinical correlation is recommended. Question mild overlying soft tissue swelling. 2. Minimal ethmoid sinus mucosal thickening. CT cervical spine: 1. No evidence of acute fracture to the cervical spine. 2. 2 mm C7-T1 grade 1  anterolisthesis. 3. C3-C4 and C4-C5 vertebral fusion. 4. Cervical spondylosis as described. Electronically Signed   By: Jackey Loge DO   On: 07/28/2020 11:15    LOS: 1 day   Lanae Boast, MD [facility]  07/29/2020, 11:56 AM

## 2020-07-30 LAB — BASIC METABOLIC PANEL
Anion gap: 12 (ref 5–15)
BUN: 19 mg/dL (ref 8–23)
CO2: 25 mmol/L (ref 22–32)
Calcium: 9.1 mg/dL (ref 8.9–10.3)
Chloride: 104 mmol/L (ref 98–111)
Creatinine, Ser: 0.9 mg/dL (ref 0.44–1.00)
GFR, Estimated: >60 mL/min (ref >60)
Glucose, Bld: 124 mg/dL — ABNORMAL HIGH (ref 70–99)
Potassium: 3.6 mmol/L (ref 3.5–5.1)
Sodium: 141 mmol/L (ref 135–145)

## 2020-07-30 LAB — C DIFFICILE QUICK SCREEN W PCR REFLEX
C Diff antigen: NEGATIVE
C Diff interpretation: NOT DETECTED
C Diff toxin: NEGATIVE

## 2020-07-30 MED ORDER — LIFITEGRAST 5 % OP SOLN
1.0000 [drp] | Freq: Two times a day (BID) | OPHTHALMIC | Status: DC
  Administered 2020-07-30 – 2020-08-02 (×6): 1 [drp] via OPHTHALMIC

## 2020-07-30 NOTE — Progress Notes (Signed)
PROGRESS NOTE    Claudia [name]  JXB:[id number] DOB: Jul 07, 1931 DOA: 07/28/2020 PCP: [name], MD   Chief Complaint  Patient presents with   Fall   Brief Narrative: 86 y.o. female with medical history significant of HTN, chronic narcotic dependence, chronic back pain, hyperlipidemia, osteoporosis, GERD, presented with multiple falls.  Patient baseline ambulation dysfunction, depends on walker, and she lives by herself with a disabled 85 year old adopted daughter who needs around-the-clock care, patient does have home health aide daytime and her other daughter stops by in the evenings.  It appears that patient has had trouble for sleep for couple weeks and went to her PCP on Monday, who prescribed patient with Ativan 0.5 mg which patient started to take last night.  Several hours after patient took Ativan she sustained 3 episodes of fall.  Patient described her legs were feeling weak but denied any specifically one-sided weaker than the other.  Her daughter reported over the phone that the patient appeared to be confused last night and before that she has had urinary frequency the whole day yesterday.  Patient denied any dysuria, no fever chills no abdominal pain.  Another problem patient has is she has been losing weight, she has had poor appetite for several months, she denied of feeling nauseous vomiting or abdominal pain.  Patient has cataract on the left eye for which she had surgery 3 months ago and she reported her eyesight has been sufficient. ED Course: CT head negative for injuries, maxillary and facial CT showed acute mild displaced left nasal fracture.Lumbar spine L1 compression fracture subacute, L3-L4, L4-L5 bulging disc and stenosis of canal and foramina.  Blood work, potassium 2.9.  CK 300  Subjective:  Patient weak, frail. No acute events overnight. was resting on the bedside chair.  Assessment & Plan:  Fall at home, initial encounter  With acute on chronic  ambulatory dysfunction, multifactorial in the setting of UTI deconditioning, old age,polypharmacy with benzo and narcotic.  Patient on 0.5 mg ativan at bedtime, fentanyl 100 MCG patch every other day. Ativan discontinued on admission.  Has chronic pain issues, appreciate palliative care input, continue PT OT, continue IV antibiotics.  Urine culture with multiple species.   Chronic pain Cont fentanyl patch.  Lidocaine has been added.  Debility/conditioning: Encourage PT OT, seen by palliative care, added lidocaine, Remeron.  Seen by palliative care-appreciate input.  Hypokalemia: It has resolved.    Hypertension blood pressure controlled continue Cardizem  Hyperlipidemia continue simvastatin  UTI: Urine culture with multiple species, continue ceftriaxone plan to change to p.o. tomorrow   Lumbar spine compression fractures and stenosis : Does not endorse significant pain currently.  Ambulated with PT, continue lidocaine patch, her home fentanyl patch.   GOC: DNR.  Followed by palliative care.  Had extensive just with patient's daughter today high risk for readmission.Daughter wants her to go to skilled nursing facility, [facility] consulted  Nutrition: Diet Order            Diet regular Room service appropriate? Yes with Assist; Fluid consistency: Thin  Diet effective now                Body mass index is 21.01 kg/m.  DVT prophylaxis: enoxaparin (LOVENOX) injection 30 mg Start: 07/28/20 1415 Code Status:   Code Status: DNR  Family Communication: plan of care discussed with patient at bedside.  Updated daughter, encouraged her to discuss with patient, daughter wants her to go to skilled nursing facility as patient has failed  previous  HHPT in July  Status is: Inpatient Remains inpatient appropriate because:Inpatient level of care appropriate due to severity of illness and Ongoing management of UTI, unsafe dipos, pt ot eval for dispo  Dispo: The patient is from: Home               Anticipated d/c is to: SNF/home health care.  Daughter would like the patient to go to skilled nursing facility, [facility] consulted.              Anticipated d/c date is: 1 day              Patient currently is not medically stable to d/c.  Consultants:see note  Procedures:see note  Culture/Microbiology    Component Value Date/Time   SDES URINE, RANDOM 07/28/2020 1242   SPECREQUEST  07/28/2020 1242    NONE Performed at [facility], [address] [place], [place] [address]    CULT MULTIPLE SPECIES PRESENT, SUGGEST RECOLLECTION (A) 07/28/2020 1242   REPTSTATUS 07/29/2020 FINAL 07/28/2020 1242    Other culture-see note  Medications: Scheduled Meds:  acetaminophen  650 mg Oral TID   calcitonin (salmon)  1 spray Alternating Nares Daily   diltiazem  240 mg Oral Daily   enoxaparin (LOVENOX) injection  30 mg Subcutaneous Q24H   feeding supplement  237 mL Oral BID BM   fentaNYL  1 patch Transdermal Q72H   lidocaine  2 patch Transdermal Q24H   Lifitegrast  1 drop Both Eyes Daily   melatonin  3 mg Oral QHS   mirtazapine  7.5 mg Oral QHS   pantoprazole  40 mg Oral Daily   senna-docusate  2 tablet Oral QHS   simvastatin  20 mg Oral q1800   triamterene-hydrochlorothiazide  1 tablet Oral Daily   Continuous Infusions:  cefTRIAXone (ROCEPHIN)  IV 1 g (07/30/20 1301)    Antimicrobials: Anti-infectives (From admission, onward)   Start     Dose/Rate Route Frequency Ordered Stop   07/29/20 1300  cefTRIAXone (ROCEPHIN) 1 g in sodium chloride 0.9 % 100 mL IVPB        1 g 200 mL/hr over 30 Minutes Intravenous Every 24 hours 07/28/20 1413     07/28/20 1345  cefTRIAXone (ROCEPHIN) 1 g in sodium chloride 0.9 % 100 mL IVPB        1 g 200 mL/hr over 30 Minutes Intravenous  Once 07/28/20 1344 07/28/20 1533     Objective: Vitals: Today's Vitals   07/29/20 2050 07/29/20 2237 07/30/20 0450 07/30/20 0800  BP: (!) 124/53  (!) 168/82   Pulse: 89  88   Resp: 18  19   Temp:  98.6 F (37 C)  97.7 F (36.5 C)   TempSrc: Oral  Oral   SpO2: 100%  100%   Weight:      PainSc:  0-No pain  0-No pain    Intake/Output Summary (Last 24 hours) at 07/30/2020 1327 Last data filed at 07/30/2020 0701 Gross per 24 hour  Intake 480 ml  Output 1 ml  Net 479 ml   Filed Weights   07/28/20 0943  Weight: 47.2 kg   Weight change:   Intake/Output from previous day: 10/21 0701 - 10/22 0700 In: 600 [P.O.:600] Out: -  Intake/Output this shift: Total I/O In: -  Out: 1 [Stool:1]  Examination: General exam: AAO oriented at baseline, NAD, weak and frail. HEENT:Oral mucosa moist, Ear/Nose WNL grossly, dentition normal. Respiratory system: bilaterally clear,no wheezing or crackles,no use of  accessory muscle Cardiovascular system: S1 & S2 +, No JVD,. Gastrointestinal system: Abdomen soft, NT,ND, BS+ Nervous System:Alert, awake, moving extremities and grossly nonfocal Extremities: No edema, distal peripheral pulses palpable.  Skin: No rashes,no icterus. MSK: Normal muscle bulk,tone, power  Data Reviewed: I have personally reviewed following labs and imaging studies CBC: Recent Labs  Lab 07/28/20 1017  WBC 6.9  NEUTROABS 4.8  HGB 13.9  HCT 42.9  MCV 86.8  PLT 274   Basic Metabolic Panel: Recent Labs  Lab 07/28/20 1017 07/28/20 1530 07/29/20 0251 07/30/20 1010  NA 140  --  139 141  K 2.9*  --  3.1* 3.6  CL 102  --  106 104  CO2 26  --  20* 25  GLUCOSE 104*  --  71 124*  BUN 15  --  15 19  CREATININE 0.98  --  0.85 0.90  CALCIUM 9.2  --  8.6* 9.1  MG  --  2.1  --   --   PHOS  --  3.4  --   --    GFR: Estimated Creatinine Clearance: 30 mL/min (by C-G formula based on SCr of 0.9 mg/dL). Liver Function Tests: No results for input(s): AST, ALT, ALKPHOS, BILITOT, PROT, ALBUMIN in the last 168 hours. No results for input(s): LIPASE, AMYLASE in the last 168 hours. No results for input(s): AMMONIA in the last 168 hours. Coagulation Profile: No results for  input(s): INR, PROTIME in the last 168 hours. Cardiac Enzymes: Recent Labs  Lab 07/28/20 1017  CKTOTAL 324*   BNP (last 3 results) No results for input(s): PROBNP in the last 8760 hours. HbA1C: No results for input(s): HGBA1C in the last 72 hours. CBG: Recent Labs  Lab 07/28/20 0952  GLUCAP 93   Lipid Profile: No results for input(s): CHOL, HDL, LDLCALC, TRIG, CHOLHDL, LDLDIRECT in the last 72 hours. Thyroid Function Tests: Recent Labs    07/28/20 1412  TSH 0.511   Anemia Panel: No results for input(s): VITAMINB12, FOLATE, FERRITIN, TIBC, IRON, RETICCTPCT in the last 72 hours. Sepsis Labs: No results for input(s): PROCALCITON, LATICACIDVEN in the last 168 hours.  Recent Results (from the past 240 hour(s))  Respiratory Panel by RT PCR (Flu A&B, Covid) - Nasopharyngeal Swab     Status: None   Collection Time: 07/28/20 12:10 PM   Specimen: Nasopharyngeal Swab  Result Value Ref Range Status   SARS Coronavirus 2 by RT PCR NEGATIVE NEGATIVE Final    Comment: (NOTE) SARS-CoV-2 target nucleic acids are NOT DETECTED.  The SARS-CoV-2 RNA is generally detectable in upper respiratoy specimens during the acute phase of infection. The lowest concentration of SARS-CoV-2 viral copies this assay can detect is 131 copies/mL. A negative result does not preclude SARS-Cov-2 infection and should not be used as the sole basis for treatment or other patient management decisions. A negative result may occur with  improper specimen collection/handling, submission of specimen other than nasopharyngeal swab, presence of viral mutation(s) within the areas targeted by this assay, and inadequate number of viral copies (<131 copies/mL). A negative result must be combined with clinical observations, patient history, and epidemiological information. The expected result is Negative.  Fact Sheet for Patients:  https://www.moore.com/https://www.fda.gov/media/142436/download  Fact Sheet for Healthcare Providers:    https://www.young.biz/https://www.fda.gov/media/142435/download  This test is no t yet approved or cleared by the [place] FDA and  has been authorized for detection and/or diagnosis of SARS-CoV-2 by FDA under an Emergency Use Authorization (EUA). This EUA will remain  in effect (  meaning this test can be used) for the duration of the COVID-19 declaration under Section 564(b)(1) of the Act, 21 U.S.C. section 360bbb-3(b)(1), unless the authorization is terminated or revoked sooner.     Influenza A by PCR NEGATIVE NEGATIVE Final   Influenza B by PCR NEGATIVE NEGATIVE Final    Comment: (NOTE) The Xpert Xpress SARS-CoV-2/FLU/RSV assay is intended as an aid in  the diagnosis of influenza from Nasopharyngeal swab specimens and  should not be used as a sole basis for treatment. Nasal washings and  aspirates are unacceptable for Xpert Xpress SARS-CoV-2/FLU/RSV  testing.  Fact Sheet for Patients: https://www.moore.com/  Fact Sheet for Healthcare Providers: https://www.young.biz/  This test is not yet approved or cleared by the [place] FDA and  has been authorized for detection and/or diagnosis of SARS-CoV-2 by  FDA under an Emergency Use Authorization (EUA). This EUA will remain  in effect (meaning this test can be used) for the duration of the  Covid-19 declaration under Section 564(b)(1) of the Act, 21  U.S.C. section 360bbb-3(b)(1), unless the authorization is  terminated or revoked. Performed at [facility], [address], [place], [place] [address]   Urine culture     Status: Abnormal   Collection Time: 07/28/20 12:42 PM   Specimen: Urine, Random  Result Value Ref Range Status   Specimen Description URINE, RANDOM  Final   Special Requests   Final    NONE Performed at [facility], [address] [place], [place] [address]    Culture MULTIPLE SPECIES PRESENT, SUGGEST RECOLLECTION (A)  Final   Report Status 07/29/2020 FINAL  Final  C  Difficile Quick Screen w PCR reflex     Status: None   Collection Time: 07/30/20 10:49 AM   Specimen: STOOL  Result Value Ref Range Status   C Diff antigen NEGATIVE NEGATIVE Final   C Diff toxin NEGATIVE NEGATIVE Final   C Diff interpretation No C. difficile detected.  Final    Comment: Performed at [facility], [address] [place], [place] [address]     Radiology Studies: No results found.  LOS: 2 days   Lanae Boast, MD [facility]  07/30/2020, 1:27 PM

## 2020-07-30 NOTE — Progress Notes (Signed)
This chaplain followed with Pt. Spiritual care.  The Pt. is sitting up in the recliner, finishing her Ensure and talking about the biscuit she had for breakfast.   The anticipated doctor's visit is on the Pt.'s mind today.  The Pt. is looking forward to d/c. The Pt. confirmed [name] visited yesterday and [name] son will visit today.  The chaplain circled back to Thursday discussion about the Pt. AD. The chaplain understands the Pt. has four children. The Pt. was not aware all the children may have to come together to make medical decisions under [place] Law. The Pt. plans to talk to [facility] again about HCPOA.  F/U spiritual care is available as needed.

## 2020-07-30 NOTE — TOC Initial Note (Addendum)
Transition of Care [facility]) - Initial/Assessment Note    Patient Details  Name: Claudia [name] MRN: [id number] Date of Birth: 1931-04-26  [facility] CM/SW Contact:    Kingsley Plan, RN Phone Number: 07/30/2020, 2:00 PM  Clinical Narrative:                 Discussed discharge plam with patient and daughter Claudia [name] at bedside.   Patient from home and has a handicap daughter at home. NCM consulted to provide resources on home health aides and group home resources.   Claudia [name] aware of resources already"we have been down this road before" , left resources at bedside.   Patient has had home health in the past. Patient did agree to home health PT  And aides. Patient and daughter aware they will not be there daily or for long periods of time.    Daughter would prefer her mother to go to a SNF for short term rehab at discharge. Patient declining. NCM offered to fax her information to SNF's so she would know her offers and than decide. Patient declined.  Patient has a walker, 3 in1 and Rolator at home already. Daughter states patient's bed at home is high. NCM discussed a hospital bed. Patient declined.   Patient did agree to palliative care services with [facility] ( left message with Chales Abrahams ) and Home health services .   Kandee Keen with [facility]  Accepted referral.  Expected Discharge Plan: Home w Home Health Services Barriers to Discharge: Continued Medical Work up   Patient Goals and CMS Choice Patient states their goals for this hospitalization and ongoing recovery are:: to go home CMS Medicare.gov Compare Post Acute Care list provided to:: Patient Choice offered to / list presented to : Patient, Adult Children  Expected Discharge Plan and Services Expected Discharge Plan: Home w Home Health Services     Post Acute Care Choice: Home Health Living arrangements for the past 2 months: Single Family Home                 DME Arranged: N/A         HH Arranged: Nurse's Aide,  PT          Prior Living Arrangements/Services Living arrangements for the past 2 months: Single Family Home Lives with::  (handicapp daughter) Patient language and need for interpreter reviewed:: Yes Do you feel safe going back to the place where you live?: Yes          Current home services: DME Criminal Activity/Legal Involvement Pertinent to Current Situation/Hospitalization: No - Comment as needed  Activities of Daily Living      Permission Sought/Granted   Permission granted to share information with : Yes, Verbal Permission Granted  Share Information with NAME: daughter Gi Gi           Emotional Assessment Appearance:: Appears stated age Attitude/Demeanor/Rapport: Self-Confident Affect (typically observed): Agitated Orientation: : Oriented to Self, Oriented to Place, Oriented to  Time, Oriented to Situation Alcohol / Substance Use: Not Applicable Psych Involvement: No (comment)  Admission diagnosis:  Weakness [R53.1] Fall [W19.XXXA] Acute cystitis without hematuria [N30.00] Closed fracture of nasal bone, initial encounter [S02.2XXA] Patient Active Problem List   Diagnosis Date Noted  . Palliative care by specialist   . Goals of care, counseling/discussion   . DNR (do not resuscitate)   . DNI (do not intubate)   . Encounter for hospice care discussion   . Fall at home, initial encounter 07/28/2020  .  Fall 07/28/2020  . Weakness   . Closed fracture of nasal bones   . Acute cystitis without hematuria   . Pain management contract signed 04/08/2020  . Protein-calorie malnutrition, severe (HCC) 03/11/2020  . Hyperlipidemia 12/20/2017  . Routine general medical examination at a health care facility 12/19/2016  . Insomnia 06/19/2015  . Narcotic dependence (HCC) 12/13/2013  . Chronic back pain 12/13/2013  . Hypertension    PCP:  [name], MD Pharmacy:   [facility] [place], [place] - [address] [place] [address] Phone: [phone] Fax: [phone]  [facility] [place] [place] - [address] [place], [address] [place], [address] [place] [address] Phone: [phone] Fax: [phone]     Social Determinants of Health (SDOH) Interventions    Readmission Risk Interventions No flowsheet data found.

## 2020-07-30 NOTE — Progress Notes (Signed)
Patent examiner [facility])  Hospital Liaison: RN note         Notified by [facility] manager of patient/family request for [facility] Palliative services at home after discharge.         Writer spoke with patient's daughter to confirm interest and explain services.               [facility] Palliative team will follow up with patient after discharge.         Please call with any hospice or palliative related questions.         Thank you for this referral.         Elsie Saas, RN, CCM  [facility] Liaison (listed on AMION under Hospice/[facility])    [phone]

## 2020-07-30 NOTE — Plan of Care (Signed)
  Problem: Urinary Elimination: Goal: Signs and symptoms of infection will decrease Outcome: Progressing   Problem: Education: Goal: Knowledge of General Education information will improve Description: Including pain rating scale, medication(s)/side effects and non-pharmacologic comfort measures Outcome: Progressing   Problem: Health Behavior/Discharge Planning: Goal: Ability to manage health-related needs will improve Outcome: Progressing   Problem: Clinical Measurements: Goal: Ability to maintain clinical measurements within normal limits will improve Outcome: Progressing Goal: Will remain free from infection Outcome: Progressing Goal: Diagnostic test results will improve Outcome: Progressing Goal: Respiratory complications will improve Outcome: Progressing Goal: Cardiovascular complication will be avoided Outcome: Progressing   Problem: Activity: Goal: Risk for activity intolerance will decrease Outcome: Progressing   Problem: Nutrition: Goal: Adequate nutrition will be maintained Outcome: Progressing   Problem: Coping: Goal: Level of anxiety will decrease Outcome: Progressing   Problem: Elimination: Goal: Will not experience complications related to bowel motility Outcome: Progressing Goal: Will not experience complications related to urinary retention Outcome: Progressing   Problem: Pain Managment: Goal: General experience of comfort will improve Outcome: Progressing   Problem: Skin Integrity: Goal: Risk for impaired skin integrity will decrease Outcome: Progressing   Problem: Safety: Goal: Ability to remain free from injury will improve Outcome: Progressing

## 2020-07-31 DIAGNOSIS — G4700 Insomnia, unspecified: Secondary | ICD-10-CM

## 2020-07-31 DIAGNOSIS — R52 Pain, unspecified: Secondary | ICD-10-CM

## 2020-07-31 MED ORDER — MIRTAZAPINE 15 MG PO TABS
15.0000 mg | ORAL_TABLET | Freq: Every day | ORAL | Status: DC
  Administered 2020-07-31 – 2020-08-01 (×2): 15 mg via ORAL
  Filled 2020-07-31 (×2): qty 1

## 2020-07-31 NOTE — [place] FL2 (Signed)
[place] MEDICAID FL2 LEVEL OF CARE SCREENING TOOL     IDENTIFICATION  Patient Name: Claudia [name] Birthdate: 1931/02/28 Sex: female Admission Date (Current Location): 07/28/2020  [facility] and [place] Number:  Producer, television/film/video and Address:  [facility] [place], [address], [place], [place] [address]      Provider Number: [id number]  Attending Physician Name and Address:  Lanae Boast, MD  Relative Name and Phone Number:  [name] [phone]    Current Level of Care: Hospital Recommended Level of Care: Skilled Nursing Facility Prior Approval Number:    Date Approved/Denied:   PASRR Number: [id number]  Discharge Plan: SNF    Current Diagnoses: Patient Active Problem List   Diagnosis Date Noted  . Palliative care by specialist   . Goals of care, counseling/discussion   . DNR (do not resuscitate)   . DNI (do not intubate)   . Encounter for hospice care discussion   . Fall at home, initial encounter 07/28/2020  . Fall 07/28/2020  . Weakness   . Closed fracture of nasal bones   . Acute cystitis without hematuria   . Pain management contract signed 04/08/2020  . Protein-calorie malnutrition, severe (HCC) 03/11/2020  . Hyperlipidemia 12/20/2017  . Routine general medical examination at a health care facility 12/19/2016  . Insomnia 06/19/2015  . Narcotic dependence (HCC) 12/13/2013  . Chronic back pain 12/13/2013  . Hypertension     Orientation RESPIRATION BLADDER Height & Weight     Self, Time, Situation, Place  Normal Continent, External catheter Weight: 104 lb (47.2 kg) Height:     BEHAVIORAL SYMPTOMS/MOOD NEUROLOGICAL BOWEL NUTRITION STATUS      Incontinent Diet (See discharge summary)  AMBULATORY STATUS COMMUNICATION OF NEEDS Skin   Limited Assist Verbally Other (Comment), Skin abrasions (Ecchymosis, dry)                       Personal Care Assistance Level of Assistance  Bathing, Feeding, Dressing Bathing Assistance: Limited  assistance Feeding assistance: Independent Dressing Assistance: Limited assistance     Functional Limitations Info  Sight, Hearing, Speech Sight Info: Adequate Hearing Info: Impaired Speech Info: Adequate    SPECIAL CARE FACTORS FREQUENCY  PT (By licensed PT), OT (By licensed OT)     PT Frequency: 5x per week OT Frequency: 5x per week            Contractures Contractures Info: Not present    Additional Factors Info  Code Status, Allergies Code Status Info: DNR Allergies Info: Amitiza lubiprostone, Benadryl Diphenhydramine, Ultram Tramadol, Aspirin, Ibuprofen, Sulfa Antibiotics           Current Medications (07/31/2020):  This is the current hospital active medication list Current Facility-Administered Medications  Medication Dose Route Frequency Provider Last Rate Last Admin  . acetaminophen (TYLENOL) tablet 1,000 mg  1,000 mg Oral Q6H PRN Mikey College T, MD   1,000 mg at 07/28/20 2331  . acetaminophen (TYLENOL) tablet 650 mg  650 mg Oral TID Anderson Malta L, DO   650 mg at 07/31/20 7408  . calcitonin (salmon) (MIACALCIN/FORTICAL) nasal spray 1 spray  1 spray Alternating Nares Daily Haskel Khan, NP   1 spray at 07/31/20 0945  . cefTRIAXone (ROCEPHIN) 1 g in sodium chloride 0.9 % 100 mL IVPB  1 g Intravenous Q24H Mikey College T, MD 200 mL/hr at 07/30/20 1958 1 g at 07/30/20 1958  . diltiazem (CARDIZEM CD) 24 hr capsule 240 mg  240 mg Oral Daily Mikey College T, MD   240 mg at 07/31/20 [phone]  . enoxaparin (LOVENOX) injection 30 mg  30 mg Subcutaneous Q24H Mikey College T, MD   30 mg at 07/31/20 0944  . feeding supplement (ENSURE ENLIVE / ENSURE PLUS) liquid 237 mL  237 mL Oral BID BM Mikey College T, MD   237 mL at 07/31/20 1353  . fentaNYL (DURAGESIC) 100 MCG/HR 1 patch  1 patch Transdermal Q72H Mikey College T, MD      . hydrALAZINE (APRESOLINE) tablet 25 mg  25 mg Oral Q6H PRN Emeline General, MD   25 mg at 07/28/20 2055  . lidocaine (LIDODERM) 5 % 2 patch  2 patch  Transdermal Q24H Anderson Malta L, DO   2 patch at 07/31/20 1353  . Lifitegrast 5 % SOLN 1 drop  1 drop Both Eyes BID Lanae Boast, MD   1 drop at 07/31/20 0947  . melatonin tablet 3 mg  3 mg Oral QHS Mikey College T, MD   3 mg at 07/30/20 2238  . mirtazapine (REMERON) tablet 7.5 mg  7.5 mg Oral QHS Girard Cooter M, NP   7.5 mg at 07/30/20 2237  . ondansetron (ZOFRAN) tablet 4 mg  4 mg Oral Q6H PRN Mikey College T, MD       Or  . ondansetron [facility]) injection 4 mg  4 mg Intravenous Q6H PRN Mikey College T, MD      . pantoprazole (PROTONIX) EC tablet 40 mg  40 mg Oral Daily Mikey College T, MD   40 mg at 07/31/20 0942  . polyethylene glycol (MIRALAX / GLYCOLAX) packet 17 g  17 g Oral Daily PRN Mikey College T, MD      . senna-docusate (Senokot-S) tablet 2 tablet  2 tablet Oral QHS Anderson Malta L, DO   2 tablet at 07/30/20 2237  . simvastatin (ZOCOR) tablet 20 mg  20 mg Oral q1800 Mikey College T, MD   20 mg at 07/30/20 1713  . triamterene-hydrochlorothiazide (MAXZIDE-25) 37.5-25 MG per tablet 1 tablet  1 tablet Oral Daily Emeline General, MD   1 tablet at 07/31/20 4315     Discharge Medications: Please see discharge summary for a list of discharge medications.  Relevant Imaging Results:  Relevant Lab Results:   Additional Information SSN# [id number]  Patrice [place], [place]

## 2020-07-31 NOTE — TOC Progression Note (Signed)
Transition of Care [facility]) - Progression Note    Patient Details  Name: LOSSIE KALP MRN: [id number] Date of Birth: 09-05-1931  Transition of Care [facility]) CM/SW Contact  [place]rice [place], [place] Phone Number: [phone] 07/31/2020, 3:37 PM  Clinical Narrative:     CSW was alerted that patient is now agreeable to SNF. CSW asked if patient had been to a facility before and she informed CSW that she had not. CSW explained the SNF process and received  permission to fax out. At this time, patient did not have a preference.  CSW also spoke with patient's daughter [name] and updated her about disposition plan. CSW provided [name] with the medicare.gov website for the ratings.  TOC team will continue to assist with discharge planning needs.  Expected Discharge Plan: Skilled Nursing Facility Barriers to Discharge: Continued Medical Work up  Expected Discharge Plan and Services Expected Discharge Plan: Skilled Nursing Facility     Post Acute Care Choice: Home Health Living arrangements for the past 2 months: Single Family Home                 DME Arranged: N/A         HH Arranged: Nurse's Aide, PT           Social Determinants of Health (SDOH) Interventions    Readmission Risk Interventions No flowsheet data found.

## 2020-07-31 NOTE — Progress Notes (Signed)
PROGRESS NOTE    Claudia [name]  BHA:[id number] DOB: 1931-01-08 DOA: 07/28/2020 PCP: [name], MD   Chief Complaint  Patient presents with  . Fall   Brief Narrative: 86 y.o. female with medical history significant of HTN, chronic narcotic dependence, chronic back pain, hyperlipidemia, osteoporosis, GERD, presented with multiple falls.  Patient baseline ambulation dysfunction, depends on walker, and she lives by herself with a disabled 84 year old adopted daughter who needs around-the-clock care, patient does have home health aide daytime and her other daughter stops by in the evenings.  It appears that patient has had trouble for sleep for couple weeks and went to her PCP on Monday, who prescribed patient with Ativan 0.5 mg which patient started to take last night.  Several hours after patient took Ativan she sustained 3 episodes of fall.  Patient described her legs were feeling weak but denied any specifically one-sided weaker than the other.  Her daughter reported over the phone that the patient appeared to be confused last night and before that she has had urinary frequency the whole day yesterday.  Patient denied any dysuria, no fever chills no abdominal pain.  Another problem patient has is she has been losing weight, she has had poor appetite for several months, she denied of feeling nauseous vomiting or abdominal pain.  Patient has cataract on the left eye for which she had surgery 3 months ago and she reported her eyesight has been sufficient. ED Course: CT head negative for injuries, maxillary and facial CT showed acute mild displaced left nasal fracture.Lumbar spine L1 compression fracture subacute, L3-L4, L4-L5 bulging disc and stenosis of canal and foramina.  Blood work, potassium 2.9.  CK 300  Subjective: Alert, awake and oriented, not in acute distress.  She is resting comfortably. Denies any pain.  After discussion with her she agrees for a skilled nursing facility  placement.  Assessment & Plan:  Fall at home, initial encounter  With acute on chronic ambulatory dysfunction, multifactorial in the setting of UTI deconditioning, old age,polypharmacy with benzo and narcotic.  Patient on 0.5 mg ativan at bedtime, fentanyl 100 MCG patch every other day. Ativan discontinued on admission.  Has chronic pain issues.palliative care appreciate input.  Seen by PT OT continue UTI treatment.  Will benefit with skilled nursing facility placement and is agreeable.   UTI: Urine culture with multiple species, continue ceftriaxone, change to p.o. today.  Lumbar spine compression fractures and stenosis : Pain is controlled on fentanyl.  Denies any pain.  Continue PT OT ambulation.    Chronic pain is controlled on her fentanyl patch.  Continue lidocaine. Debility/conditioning: Continue PT OT.  Seen by palliative care,added bedtime Remeron.  Hypokalemia: Repleted. Hypertension BP is controlled on Cardizem.   Hyperlipidemia continue simvastatin  GOC: DNR.  Followed by palliative care.  Had extensive just with patient's daughter today high risk for readmission.Daughter wants her to go to skilled nursing facility, [facility] consulted  Nutrition: Diet Order            Diet regular Room service appropriate? Yes with Assist; Fluid consistency: Thin  Diet effective now                Body mass index is 21.01 kg/m.  DVT prophylaxis: enoxaparin (LOVENOX) injection 30 mg Start: 07/28/20 1415 Code Status:   Code Status: DNR  Family Communication: plan of care discussed with patient at bedside.  Updated patient's daughter. Patient is agreeable for SNF.  Status is: Inpatient Remains inpatient appropriate  because:Inpatient level of care appropriate due to severity of illness and Ongoing management of UTI, unsafe dipos, pt ot eval for dispo  Dispo: The patient is from: Home              Anticipated d/c is to: SNF - she is agreeable now. SW consulted              Anticipated d/c  date is: 1 day              Patient currently is not medically stable to d/c.  Consultants:see note  Procedures:see note  Culture/Microbiology    Component Value Date/Time   SDES URINE, RANDOM 07/28/2020 1242   SPECREQUEST  07/28/2020 1242    NONE Performed at [facility], [address], [place], [place] [address]    CULT MULTIPLE SPECIES PRESENT, SUGGEST RECOLLECTION (A) 07/28/2020 1242   REPTSTATUS 07/29/2020 FINAL 07/28/2020 1242    Other culture-see note  Medications: Scheduled Meds: . acetaminophen  650 mg Oral TID  . calcitonin (salmon)  1 spray Alternating Nares Daily  . diltiazem  240 mg Oral Daily  . enoxaparin (LOVENOX) injection  30 mg Subcutaneous Q24H  . feeding supplement  237 mL Oral BID BM  . fentaNYL  1 patch Transdermal Q72H  . lidocaine  2 patch Transdermal Q24H  . Lifitegrast  1 drop Both Eyes BID  . melatonin  3 mg Oral QHS  . mirtazapine  7.5 mg Oral QHS  . pantoprazole  40 mg Oral Daily  . senna-docusate  2 tablet Oral QHS  . simvastatin  20 mg Oral q1800  . triamterene-hydrochlorothiazide  1 tablet Oral Daily   Continuous Infusions: . cefTRIAXone (ROCEPHIN)  IV 1 g (07/30/20 1958)    Antimicrobials: Anti-infectives (From admission, onward)   Start     Dose/Rate Route Frequency Ordered Stop   07/29/20 1300  cefTRIAXone (ROCEPHIN) 1 g in sodium chloride 0.9 % 100 mL IVPB        1 g 200 mL/hr over 30 Minutes Intravenous Every 24 hours 07/28/20 1413     07/28/20 1345  cefTRIAXone (ROCEPHIN) 1 g in sodium chloride 0.9 % 100 mL IVPB        1 g 200 mL/hr over 30 Minutes Intravenous  Once 07/28/20 1344 07/28/20 1533     Objective: Vitals: Today's Vitals   07/30/20 1958 07/31/20 0335 07/31/20 0942 07/31/20 0945  BP:  122/74 (!) 146/86   Pulse:  97    Resp:  14    Temp:  98.3 F (36.8 C)    TempSrc:  Oral    SpO2:  100%    Weight:      PainSc: 0-No pain   0-No pain    Intake/Output Summary (Last 24 hours) at 07/31/2020 1313 Last  data filed at 07/31/2020 1041 Gross per 24 hour  Intake 440 ml  Output --  Net 440 ml   Filed Weights   07/28/20 0943  Weight: 47.2 kg   Weight change:   Intake/Output from previous day: 10/22 0701 - 10/23 0700 In: 200 [IV Piggyback:200] Out: 1 [Stool:1] Intake/Output this shift: Total I/O In: 240 [P.O.:240] Out: -   Examination: General exam: AAOx3 , NAD, weak appearing. HEENT:Oral mucosa moist, Ear/Nose WNL grossly, dentition normal. Respiratory system: bilaterally clear,no wheezing or crackles,no use of accessory muscle Cardiovascular system: S1 & S2 +, No JVD,. Gastrointestinal system: Abdomen soft, NT,ND, BS+ Nervous System:Alert, awake, moving extremities and grossly nonfocal Extremities: No edema, distal  peripheral pulses palpable.  Skin: No rashes,no icterus. MSK: Normal muscle bulk,tone, power  Data Reviewed: I have personally reviewed following labs and imaging studies CBC: Recent Labs  Lab 07/28/20 1017  WBC 6.9  NEUTROABS 4.8  HGB 13.9  HCT 42.9  MCV 86.8  PLT 274   Basic Metabolic Panel: Recent Labs  Lab 07/28/20 1017 07/28/20 1530 07/29/20 0251 07/30/20 1010  NA 140  --  139 141  K 2.9*  --  3.1* 3.6  CL 102  --  106 104  CO2 26  --  20* 25  GLUCOSE 104*  --  71 124*  BUN 15  --  15 19  CREATININE 0.98  --  0.85 0.90  CALCIUM 9.2  --  8.6* 9.1  MG  --  2.1  --   --   PHOS  --  3.4  --   --    GFR: Estimated Creatinine Clearance: 30 mL/min (by C-G formula based on SCr of 0.9 mg/dL). Liver Function Tests: No results for input(s): AST, ALT, ALKPHOS, BILITOT, PROT, ALBUMIN in the last 168 hours. No results for input(s): LIPASE, AMYLASE in the last 168 hours. No results for input(s): AMMONIA in the last 168 hours. Coagulation Profile: No results for input(s): INR, PROTIME in the last 168 hours. Cardiac Enzymes: Recent Labs  Lab 07/28/20 1017  CKTOTAL 324*   BNP (last 3 results) No results for input(s): PROBNP in the last 8760  hours. HbA1C: No results for input(s): HGBA1C in the last 72 hours. CBG: Recent Labs  Lab 07/28/20 0952  GLUCAP 93   Lipid Profile: No results for input(s): CHOL, HDL, LDLCALC, TRIG, CHOLHDL, LDLDIRECT in the last 72 hours. Thyroid Function Tests: Recent Labs    07/28/20 1412  TSH 0.511   Anemia Panel: No results for input(s): VITAMINB12, FOLATE, FERRITIN, TIBC, IRON, RETICCTPCT in the last 72 hours. Sepsis Labs: No results for input(s): PROCALCITON, LATICACIDVEN in the last 168 hours.  Recent Results (from the past 240 hour(s))  Respiratory Panel by RT PCR (Flu A&B, Covid) - Nasopharyngeal Swab     Status: None   Collection Time: 07/28/20 12:10 PM   Specimen: Nasopharyngeal Swab  Result Value Ref Range Status   SARS Coronavirus 2 by RT PCR NEGATIVE NEGATIVE Final    Comment: (NOTE) SARS-CoV-2 target nucleic acids are NOT DETECTED.  The SARS-CoV-2 RNA is generally detectable in upper respiratoy specimens during the acute phase of infection. The lowest concentration of SARS-CoV-2 viral copies this assay can detect is 131 copies/mL. A negative result does not preclude SARS-Cov-2 infection and should not be used as the sole basis for treatment or other patient management decisions. A negative result may occur with  improper specimen collection/handling, submission of specimen other than nasopharyngeal swab, presence of viral mutation(s) within the areas targeted by this assay, and inadequate number of viral copies (<131 copies/mL). A negative result must be combined with clinical observations, patient history, and epidemiological information. The expected result is Negative.  Fact Sheet for Patients:  https://www.moore.com/https://www.fda.gov/media/142436/download  Fact Sheet for Healthcare Providers:  https://www.young.biz/https://www.fda.gov/media/142435/download  This test is no t yet approved or cleared by the [place] FDA and  has been authorized for detection and/or diagnosis of SARS-CoV-2 by FDA  under an Emergency Use Authorization (EUA). This EUA will remain  in effect (meaning this test can be used) for the duration of the COVID-19 declaration under Section 564(b)(1) of the Act, 21 U.S.C. section 360bbb-3(b)(1), unless the authorization is terminated or  revoked sooner.     Influenza A by PCR NEGATIVE NEGATIVE Final   Influenza B by PCR NEGATIVE NEGATIVE Final    Comment: (NOTE) The Xpert Xpress SARS-CoV-2/FLU/RSV assay is intended as an aid in  the diagnosis of influenza from Nasopharyngeal swab specimens and  should not be used as a sole basis for treatment. Nasal washings and  aspirates are unacceptable for Xpert Xpress SARS-CoV-2/FLU/RSV  testing.  Fact Sheet for Patients: https://www.moore.com/  Fact Sheet for Healthcare Providers: https://www.young.biz/  This test is not yet approved or cleared by the [place] FDA and  has been authorized for detection and/or diagnosis of SARS-CoV-2 by  FDA under an Emergency Use Authorization (EUA). This EUA will remain  in effect (meaning this test can be used) for the duration of the  Covid-19 declaration under Section 564(b)(1) of the Act, 21  U.S.C. section 360bbb-3(b)(1), unless the authorization is  terminated or revoked. Performed at [facility], [address] [place], [place] [address]   Urine culture     Status: Abnormal   Collection Time: 07/28/20 12:42 PM   Specimen: Urine, Random  Result Value Ref Range Status   Specimen Description URINE, RANDOM  Final   Special Requests   Final    NONE Performed at [facility], [address], [place], [place] [address]    Culture MULTIPLE SPECIES PRESENT, SUGGEST RECOLLECTION (A)  Final   Report Status 07/29/2020 FINAL  Final  C Difficile Quick Screen w PCR reflex     Status: None   Collection Time: 07/30/20 10:49 AM   Specimen: STOOL  Result Value Ref Range Status   C Diff antigen NEGATIVE NEGATIVE Final   C  Diff toxin NEGATIVE NEGATIVE Final   C Diff interpretation No C. difficile detected.  Final    Comment: Performed at [facility], [address] [place], [place] [address]     Radiology Studies: No results found.  LOS: 3 days   Lanae Boast, MD [facility]  07/31/2020, 1:13 PM

## 2020-07-31 NOTE — Progress Notes (Signed)
Daily Progress Note   Patient Name: Claudia [name]       Date: 07/31/2020 DOB: 12/29/1930  Age: 86 y.o. MRN#: [id number] Attending Physician: Lanae Boast, MD Primary Care Physician: [name], MD Admit Date: 07/28/2020  Reason for Consultation/Follow-up: Disposition, Establishing goals of care, Non pain symptom management, Pain control and Psychosocial/spiritual support  Subjective: Chart review performed.   Went to visit patient at bedside - no family present. Patient was lying in bed awake, alert, and able to participate in conversation. She was feeding herself dinner - spaghetti.  Patient reports 0/10 pain at this time while she is lying down - she states that her pain has improved and she is appreciative. Patient states she feels more physically able to participate in rehab now that her pain is better - discussed how controlled pain can improve rehabilitation experience. Claudia [name] also reports improvement in her appetite, stating that she is now feeling more hungry and wanting to eat - she expresses joy with her dinner tray this evening. Claudia [name] states that her insomnia has not improved.   We discussed her decision to discharge to rehab facility - I expressed to her that I felt this was a good decision for her safety. She states that her daughter/[name] and home health aid will continue taking care of [name] while she is at rehab facility.   Length of Stay: 3  Current Medications: Scheduled Meds:   acetaminophen  650 mg Oral TID   calcitonin (salmon)  1 spray Alternating Nares Daily   diltiazem  240 mg Oral Daily   enoxaparin (LOVENOX) injection  30 mg Subcutaneous Q24H   feeding supplement  237 mL Oral BID BM   fentaNYL  1 patch Transdermal Q72H   lidocaine  2 patch  Transdermal Q24H   Lifitegrast  1 drop Both Eyes BID   melatonin  3 mg Oral QHS   mirtazapine  15 mg Oral QHS   pantoprazole  40 mg Oral Daily   senna-docusate  2 tablet Oral QHS   simvastatin  20 mg Oral q1800   triamterene-hydrochlorothiazide  1 tablet Oral Daily    Continuous Infusions:  cefTRIAXone (ROCEPHIN)  IV 1 g (07/30/20 1958)    PRN Meds: acetaminophen, hydrALAZINE, ondansetron **OR** ondansetron (ZOFRAN) IV, polyethylene glycol  Physical Exam Vitals and nursing  note reviewed.  Constitutional:      General: She is not in acute distress. Pulmonary:     Effort: No respiratory distress.  Skin:    General: Skin is warm and dry.  Neurological:     Mental Status: She is alert and oriented to person, place, and time.     Motor: Weakness present.  Psychiatric:        Attention and Perception: Attention normal.        Behavior: Behavior is cooperative.        Cognition and Memory: Cognition and memory normal.             Vital Signs: BP 138/64 (BP Location: Right Arm)    Pulse 98    Temp 98.7 F (37.1 C) (Oral)    Resp 20    Wt 47.2 kg    SpO2 100%    BMI 21.01 kg/m  SpO2: SpO2: 100 % O2 Device: O2 Device: Room Air O2 Flow Rate:    Intake/output summary:   Intake/Output Summary (Last 24 hours) at 07/31/2020 1856 Last data filed at 07/31/2020 1508 Gross per 24 hour  Intake 720 ml  Output --  Net 720 ml   LBM: Last BM Date: 07/30/20 Baseline Weight: Weight: 47.2 kg Most recent weight: Weight: 47.2 kg       Palliative Assessment/Data: PPS 50%      Patient Active Problem List   Diagnosis Date Noted   Palliative care by specialist    Goals of care, counseling/discussion    DNR (do not resuscitate)    DNI (do not intubate)    Encounter for hospice care discussion    Fall at home, initial encounter 07/28/2020   Fall 07/28/2020   Weakness    Closed fracture of nasal bones    Acute cystitis without hematuria    Pain management  contract signed 04/08/2020   Protein-calorie malnutrition, severe (HCC) 03/11/2020   Hyperlipidemia 12/20/2017   Routine general medical examination at a health care facility 12/19/2016   Insomnia 06/19/2015   Narcotic dependence (HCC) 12/13/2013   Chronic back pain 12/13/2013   Hypertension     Palliative Care Assessment & Plan   Patient Profile: 86 y.o.femalewith past medical history of HTN, hyperlipidemia, osteoporosis, GERD, chronic back pain, long-term narcotic dependence. She [facility]21with a fall.Per intake H&P, patient had taken Ativan, which was recently prescribed by her PCP for difficulty sleeping. Daughter reported that patient appeared to be confused and was having urinary frequency the day before.  ED Course:CT head negative for injuries, maxillary and facial CT showed acute mild displaced left nasal fracture. CT lumbar spineshows subacute L1 compression fracture, spinal stenosis (L3-4 and L4-5), and bulging disc ((L3-4 and L5-S1). Urinalysis shows large leukocytes and positive nitrite. Potassium 2.9 Admitted to [facility] for management of fall, UTI, hypokalemia, lumbar spine fracture, and uncontrolled HTN.  Assessment: Fall at home Ambulatory dysfunction Hypokalemia UTI Lumbar spine compression fractures and stenosis Generalized weakness Severe malnutrition   Recommendations/Plan:  Continue current medical treatment  Continue DNR/DNI as previously documented  Continue scheduled tylenol and lidocaine patches for pain management - patient states improvement with pain and increased willingness to participate in rehab now that her pain has improved  Increased mirtazapine from 7.5mg  to 15mg  - will see if this helps with her insomnia, is already helping increase her appetite  PMT will continue to follow holistically  Goals of Care and Additional Recommendations:  Limitations on Scope of Treatment: Full Scope Treatment  Code Status:      Code Status Orders  (From admission, onward)         Start     Ordered   07/29/20 1019  Do not attempt resuscitation (DNR)  Continuous       Question Answer Comment  In the event of cardiac or respiratory ARREST Do not call a code blue   In the event of cardiac or respiratory ARREST Do not perform Intubation, CPR, defibrillation or ACLS   In the event of cardiac or respiratory ARREST Use medication by any route, position, wound care, and other measures to relive pain and suffering. May use oxygen, suction and manual treatment of airway obstruction as needed for comfort.      07/29/20 1018        Code Status History    Date Active Date Inactive Code Status Order ID Comments User Context   07/28/2020 1412 07/29/2020 1018 Full Code [id number]  Emeline General, MD ED   12/13/2013 1226 12/15/2013 1712 Full Code [id number]  Clydia Llano, MD Inpatient   Advance Care Planning Activity       Prognosis:   < 6 months  Discharge Planning:  Skilled Nursing Facility for rehab with Palliative care service follow-up  Care plan was discussed with patient  Thank you for allowing the Palliative Medicine Team to assist in the care of this patient.   Total Time 15 minutes Prolonged Time Billed  no       Greater than 50%  of this time was spent counseling and coordinating care related to the above assessment and plan.  Haskel Khan, NP  Please contact Palliative Medicine Team phone at [phone] for questions and concerns.

## 2020-08-01 MED ORDER — CEPHALEXIN 500 MG PO CAPS
500.0000 mg | ORAL_CAPSULE | Freq: Three times a day (TID) | ORAL | Status: DC
  Administered 2020-08-01 – 2020-08-02 (×4): 500 mg via ORAL
  Filled 2020-08-01: qty 1
  Filled 2020-08-01: qty 2
  Filled 2020-08-01 (×2): qty 1

## 2020-08-01 NOTE — Progress Notes (Signed)
PROGRESS NOTE    Claudia [name]  VVO:[id number] DOB: Nov 01, 1930 DOA: 07/28/2020 PCP: [name], MD   Chief Complaint  Patient presents with   Fall   Brief Narrative: 86 y.o. female with medical history significant of HTN, chronic narcotic dependence, chronic back pain, hyperlipidemia, osteoporosis, GERD, presented with multiple falls.  Patient baseline ambulation dysfunction, depends on walker, and she lives by herself with a disabled 84 year old adopted daughter who needs around-the-clock care, patient does have home health aide daytime and her other daughter stops by in the evenings.  It appears that patient has had trouble for sleep for couple weeks and went to her PCP on Monday, who prescribed patient with Ativan 0.5 mg which patient started to take last night.  Several hours after patient took Ativan she sustained 3 episodes of fall.  Patient described her legs were feeling weak but denied any specifically one-sided weaker than the other.  Her daughter reported over the phone that the patient appeared to be confused last night and before that she has had urinary frequency the whole day yesterday.  Patient denied any dysuria, no fever chills no abdominal pain.  Another problem patient has is she has been losing weight, she has had poor appetite for several months, she denied of feeling nauseous vomiting or abdominal pain.  Patient has cataract on the left eye for which she had surgery 3 months ago and she reported her eyesight has been sufficient. ED Course: CT head negative for injuries, maxillary and facial CT showed acute mild displaced left nasal fracture.Lumbar spine L1 compression fracture subacute, L3-L4, L4-L5 bulging disc and stenosis of canal and foramina.  Blood work, potassium 2.9.  CK 300. Patient was admitted treated with IV antibiotics for UTI.  Lidocaine patch pain management at rest and overall doing well.  Seen by PT OT and has advised skilled nursing facility,  initially reluctant but subsequently agreed for skilled nursing facility.  Patient was seen by palliative care.  Subjective: No acute events overnight.  Afebrile. Is resting comfortably on the bedside chair drinking Ensure.  Assessment & Plan:  Fall at home, initial encounter  With acute on chronic ambulatory dysfunction, multifactorial in the setting of UTI deconditioning, old age,polypharmacy with benzo and narcotic.  Patient on 0.5 mg ativan at bedtime, fentanyl 100 MCG patch every other day. Ativan discontinued on admission.  Has chronic pain issues.palliative care appreciate input.  Continue to work with PT OT.  Awaiting for skilled nursing facility disposition.   UTI: Urine culture with multiple species, continue ceftriaxone, continue Keflex.  Lumbar spine compression fractures and stenosis : Pain is controlled on fentanyl, lidocaine continue PT OT.   Chronic pain is controlled on her fentanyl patch.  Continue lidocaine. Debility/conditioning: Continue PT OT, appreciate speech input.  Added Remeron.  Hypokalemia: Resolved. Hypertension BP is controlled on Cardizem.   Hyperlipidemia continue simvastatin  GOC: DNR.  Followed by palliative care.  Had extensive just with patient's daughter today high risk for readmission.Daughter wants her to go to skilled nursing facility, [facility] consulted  Nutrition: Diet Order            Diet regular Room service appropriate? Yes with Assist; Fluid consistency: Thin  Diet effective now                Body mass index is 21.01 kg/m.  DVT prophylaxis: enoxaparin (LOVENOX) injection 30 mg Start: 07/28/20 1415 Code Status:   Code Status: DNR  Family Communication: plan of care discussed with  patient at bedside.  Updated patient's daughter. Patient is agreeable for SNF.  Status is: Inpatient Remains inpatient appropriate because:Inpatient level of care appropriate due to severity of illness and Ongoing management of UTI, unsafe dipos, pt ot eval  for dispo  Dispo: The patient is from: Home              Anticipated d/c is to: SNF - she is agreeable now. SW consulted              Anticipated d/c date TG:YBWL  Bed available              Patient currently is medically stable for discharge  Consultants:see note  Procedures:see note  Culture/Microbiology    Component Value Date/Time   SDES URINE, RANDOM 07/28/2020 1242   SPECREQUEST  07/28/2020 1242    NONE Performed at [facility], [address], [place], [place] [address]    CULT MULTIPLE SPECIES PRESENT, SUGGEST RECOLLECTION (A) 07/28/2020 1242   REPTSTATUS 07/29/2020 FINAL 07/28/2020 1242    Other culture-see note  Medications: Scheduled Meds:  acetaminophen  650 mg Oral TID   calcitonin (salmon)  1 spray Alternating Nares Daily   cephALEXin  500 mg Oral Q8H   diltiazem  240 mg Oral Daily   enoxaparin (LOVENOX) injection  30 mg Subcutaneous Q24H   feeding supplement  237 mL Oral BID BM   fentaNYL  1 patch Transdermal Q72H   lidocaine  2 patch Transdermal Q24H   Lifitegrast  1 drop Both Eyes BID   melatonin  3 mg Oral QHS   mirtazapine  15 mg Oral QHS   pantoprazole  40 mg Oral Daily   senna-docusate  2 tablet Oral QHS   simvastatin  20 mg Oral q1800   triamterene-hydrochlorothiazide  1 tablet Oral Daily   Continuous Infusions:   Antimicrobials: Anti-infectives (From admission, onward)   Start     Dose/Rate Route Frequency Ordered Stop   08/01/20 1400  cephALEXin (KEFLEX) capsule 500 mg        500 mg Oral Every 8 hours 08/01/20 0924 08/03/20 1359   07/29/20 1300  cefTRIAXone (ROCEPHIN) 1 g in sodium chloride 0.9 % 100 mL IVPB  Status:  Discontinued        1 g 200 mL/hr over 30 Minutes Intravenous Every 24 hours 07/28/20 1413 08/01/20 0924   07/28/20 1345  cefTRIAXone (ROCEPHIN) 1 g in sodium chloride 0.9 % 100 mL IVPB        1 g 200 mL/hr over 30 Minutes Intravenous  Once 07/28/20 1344 07/28/20 1533     Objective: Vitals: Today's  Vitals   07/31/20 1336 07/31/20 2032 08/01/20 0456 08/01/20 0844  BP: 138/64 126/80 133/76   Pulse: 98 97 88   Resp: 20 16 16    Temp: 98.7 F (37.1 C) 98.4 F (36.9 C) 98 F (36.7 C)   TempSrc: Oral Oral Oral   SpO2: 100% 100% 98%   Weight:      PainSc:  0-No pain  0-No pain    Intake/Output Summary (Last 24 hours) at 08/01/2020 1115 Last data filed at 07/31/2020 2041 Gross per 24 hour  Intake 580 ml  Output 300 ml  Net 280 ml   Filed Weights   07/28/20 0943  Weight: 47.2 kg   Weight change:   Intake/Output from previous day: 10/23 0701 - 10/24 0700 In: 820 [P.O.:820] Out: 300 [Urine:300] Intake/Output this shift: No intake/output data recorded.  Examination: General exam: AAOx3  weak frail, NAD, weak appearing. HEENT:Oral mucosa moist, Ear/Nose WNL grossly, dentition normal. Respiratory system: bilaterally clear,no wheezing or crackles,no use of accessory muscle Cardiovascular system: S1 & S2 +, No JVD,. Gastrointestinal system: Abdomen soft, NT,ND, BS+ Nervous System:Alert, awake, moving extremities and grossly nonfocal Extremities: No edema, distal peripheral pulses palpable.  Skin: No rashes,no icterus. MSK: Normal muscle bulk,tone, power  Data Reviewed: I have personally reviewed following labs and imaging studies CBC: Recent Labs  Lab 07/28/20 1017  WBC 6.9  NEUTROABS 4.8  HGB 13.9  HCT 42.9  MCV 86.8  PLT 274   Basic Metabolic Panel: Recent Labs  Lab 07/28/20 1017 07/28/20 1530 07/29/20 0251 07/30/20 1010  NA 140  --  139 141  K 2.9*  --  3.1* 3.6  CL 102  --  106 104  CO2 26  --  20* 25  GLUCOSE 104*  --  71 124*  BUN 15  --  15 19  CREATININE 0.98  --  0.85 0.90  CALCIUM 9.2  --  8.6* 9.1  MG  --  2.1  --   --   PHOS  --  3.4  --   --    GFR: Estimated Creatinine Clearance: 30 mL/min (by C-G formula based on SCr of 0.9 mg/dL). Liver Function Tests: No results for input(s): AST, ALT, ALKPHOS, BILITOT, PROT, ALBUMIN in the last 168  hours. No results for input(s): LIPASE, AMYLASE in the last 168 hours. No results for input(s): AMMONIA in the last 168 hours. Coagulation Profile: No results for input(s): INR, PROTIME in the last 168 hours. Cardiac Enzymes: Recent Labs  Lab 07/28/20 1017  CKTOTAL 324*   BNP (last 3 results) No results for input(s): PROBNP in the last 8760 hours. HbA1C: No results for input(s): HGBA1C in the last 72 hours. CBG: Recent Labs  Lab 07/28/20 0952  GLUCAP 93   Lipid Profile: No results for input(s): CHOL, HDL, LDLCALC, TRIG, CHOLHDL, LDLDIRECT in the last 72 hours. Thyroid Function Tests: No results for input(s): TSH, T4TOTAL, FREET4, T3FREE, THYROIDAB in the last 72 hours. Anemia Panel: No results for input(s): VITAMINB12, FOLATE, FERRITIN, TIBC, IRON, RETICCTPCT in the last 72 hours. Sepsis Labs: No results for input(s): PROCALCITON, LATICACIDVEN in the last 168 hours.  Recent Results (from the past 240 hour(s))  Respiratory Panel by RT PCR (Flu A&B, Covid) - Nasopharyngeal Swab     Status: None   Collection Time: 07/28/20 12:10 PM   Specimen: Nasopharyngeal Swab  Result Value Ref Range Status   SARS Coronavirus 2 by RT PCR NEGATIVE NEGATIVE Final    Comment: (NOTE) SARS-CoV-2 target nucleic acids are NOT DETECTED.  The SARS-CoV-2 RNA is generally detectable in upper respiratoy specimens during the acute phase of infection. The lowest concentration of SARS-CoV-2 viral copies this assay can detect is 131 copies/mL. A negative result does not preclude SARS-Cov-2 infection and should not be used as the sole basis for treatment or other patient management decisions. A negative result may occur with  improper specimen collection/handling, submission of specimen other than nasopharyngeal swab, presence of viral mutation(s) within the areas targeted by this assay, and inadequate number of viral copies (<131 copies/mL). A negative result must be combined with  clinical observations, patient history, and epidemiological information. The expected result is Negative.  Fact Sheet for Patients:  https://www.moore.com/  Fact Sheet for Healthcare Providers:  https://www.young.biz/  This test is no t yet approved or cleared by the [place] FDA and  has  been authorized for detection and/or diagnosis of SARS-CoV-2 by FDA under an Emergency Use Authorization (EUA). This EUA will remain  in effect (meaning this test can be used) for the duration of the COVID-19 declaration under Section 564(b)(1) of the Act, 21 U.S.C. section 360bbb-3(b)(1), unless the authorization is terminated or revoked sooner.     Influenza A by PCR NEGATIVE NEGATIVE Final   Influenza B by PCR NEGATIVE NEGATIVE Final    Comment: (NOTE) The Xpert Xpress SARS-CoV-2/FLU/RSV assay is intended as an aid in  the diagnosis of influenza from Nasopharyngeal swab specimens and  should not be used as a sole basis for treatment. Nasal washings and  aspirates are unacceptable for Xpert Xpress SARS-CoV-2/FLU/RSV  testing.  Fact Sheet for Patients: https://www.moore.com/https://www.fda.gov/media/142436/download  Fact Sheet for Healthcare Providers: https://www.young.biz/https://www.fda.gov/media/142435/download  This test is not yet approved or cleared by the [place] FDA and  has been authorized for detection and/or diagnosis of SARS-CoV-2 by  FDA under an Emergency Use Authorization (EUA). This EUA will remain  in effect (meaning this test can be used) for the duration of the  Covid-19 declaration under Section 564(b)(1) of the Act, 21  U.S.C. section 360bbb-3(b)(1), unless the authorization is  terminated or revoked. Performed at [facility], [address] [place], [place] [address]   Urine culture     Status: Abnormal   Collection Time: 07/28/20 12:42 PM   Specimen: Urine, Random  Result Value Ref Range Status   Specimen Description URINE, RANDOM  Final    Special Requests   Final    NONE Performed at [facility], [address] [place], [place] [address]    Culture MULTIPLE SPECIES PRESENT, SUGGEST RECOLLECTION (A)  Final   Report Status 07/29/2020 FINAL  Final  C Difficile Quick Screen w PCR reflex     Status: None   Collection Time: 07/30/20 10:49 AM   Specimen: STOOL  Result Value Ref Range Status   C Diff antigen NEGATIVE NEGATIVE Final   C Diff toxin NEGATIVE NEGATIVE Final   C Diff interpretation No C. difficile detected.  Final    Comment: Performed at [facility], [address] [place], [place] [address]     Radiology Studies: No results found.  LOS: 4 days   Lanae Boastamesh Wandell Scullion, MD [facility]  08/01/2020, 11:15 AM

## 2020-08-01 NOTE — TOC Progression Note (Signed)
Transition of Care [facility]) - Progression Note    Patient Details  Name: Claudia [name] MRN: [id number] Date of Birth: [birth year]  [facility]) CM/SW Contact  Patrice [place], [place] Phone Number: [phone] 08/01/2020, 4:08 PM  Clinical Narrative:     Attempted to call [employer] in regards to auth started through the portal. Unable to obtain auth detail due to them being closed. Facility name will need to be given to [facility] once obtained.  TOC team will continue to assist with discharge planning needs..    Expected Discharge Plan: Skilled Nursing Facility Barriers to Discharge: Continued Medical Work up  Expected Discharge Plan and Services Expected Discharge Plan: Skilled Nursing Facility     Post Acute Care Choice: Home Health Living arrangements for the past 2 months: Single Family Home                 DME Arranged: N/A         HH Arranged: Nurse's Aide, PT           Social Determinants of Health (SDOH) Interventions    Readmission Risk Interventions No flowsheet data found.

## 2020-08-02 LAB — RESPIRATORY PANEL BY RT PCR (FLU A&B, COVID)
Influenza A by PCR: NEGATIVE
Influenza B by PCR: NEGATIVE
SARS Coronavirus 2 by RT PCR: NEGATIVE

## 2020-08-02 MED ORDER — FENTANYL 100 MCG/HR TD PT72: 1.0000 | MEDICATED_PATCH | TRANSDERMAL | 0 refills | Status: AC

## 2020-08-02 MED ORDER — LIDOCAINE 5 % EX PTCH: 2.0000 | MEDICATED_PATCH | CUTANEOUS | 0 refills | Status: DC

## 2020-08-02 MED ORDER — MIRTAZAPINE 15 MG PO TABS
15.0000 mg | ORAL_TABLET | Freq: Every day | ORAL | Status: DC
Start: 2020-08-02 — End: 2020-08-20

## 2020-08-02 MED ORDER — MELATONIN 3 MG PO TABS
3.0000 mg | ORAL_TABLET | Freq: Every day | ORAL | 0 refills | Status: DC
Start: 2020-08-02 — End: 2020-11-20

## 2020-08-02 MED ORDER — XIIDRA 5 % OP SOLN: 1.0000 [drp] | Freq: Two times a day (BID) | OPHTHALMIC | Status: AC

## 2020-08-02 NOTE — TOC Transition Note (Signed)
Transition of Care [facility]) - CM/SW Discharge Note   Patient Details  Name: LISSETTE SCHENK MRN: [id number] Date of Birth: Feb 23, 1931  Transition of Care [facility]) CM/SW Contact:  Erin Sons, LCSW Phone Number: 08/02/2020, 2:53 PM   Clinical Narrative:     Patient will DC to: [facility] Anticipated DC date: 08/02/20 Family notified: [name] Transport by: Sharin Mons   Per MD patient ready for DC to [facility] . RN, patient, patient's family, and facility notified of DC. Discharge Summary and FL2 sent to facility. RN to call report prior to discharge [phone] Room 116). DC packet on chart. Ambulance transport requested for patient.   CSW will sign off for now as social work intervention is no longer needed. Please consult [place] again if new needs arise.     Barriers to Discharge: Continued Medical Work up   Patient Goals and CMS Choice Patient states their goals for this hospitalization and ongoing recovery are:: to go home CMS Medicare.gov Compare Post Acute Care list provided to:: Patient Choice offered to / list presented to : Patient, Adult Children  Discharge Placement                       Discharge Plan and Services     Post Acute Care Choice: Home Health          DME Arranged: N/A         HH Arranged: Nurse's Aide, PT          Social Determinants of Health (SDOH) Interventions     Readmission Risk Interventions No flowsheet data found.

## 2020-08-02 NOTE — Progress Notes (Addendum)
Daily Progress Note   Patient Name: Claudia [name]       Date: 08/02/2020 DOB: [birth year]  Age: 86 y.o. MRN#: [id number] Attending Physician: Antonieta Pert, MD Primary Care Physician: Hoyt Koch, MD Admit Date: 07/28/2020  Reason for Consultation/Follow-up: Non pain symptom management, Pain control and Psychosocial/spiritual support  Subjective: Patient states she slept well last night. She expresses joy about this, because she has suffered from insomnia for a long time. Also noted she ate 75% of her breakfast this morning.  I attempted to discuss the patient's pain - she is unable to verbalize the severity (stating "I don't even know what number it is right now"). She does report it has been controlled enough for her to participate with PT.   Discussed she is going to discharge to a facility of rehab, with the goal of gaining strength so she can ultimately return home. She is agreeable to this plan, although does not really want to go. Discussed her issues with instability when ambulating and the concern about  her safety at home.   Length of Stay: 5  Current Medications: Scheduled Meds:  . acetaminophen  650 mg Oral TID  . calcitonin (salmon)  1 spray Alternating Nares Daily  . cephALEXin  500 mg Oral Q8H  . diltiazem  240 mg Oral Daily  . enoxaparin (LOVENOX) injection  30 mg Subcutaneous Q24H  . feeding supplement  237 mL Oral BID BM  . fentaNYL  1 patch Transdermal Q72H  . lidocaine  2 patch Transdermal Q24H  . Lifitegrast  1 drop Both Eyes BID  . melatonin  3 mg Oral QHS  . mirtazapine  15 mg Oral QHS  . pantoprazole  40 mg Oral Daily  . senna-docusate  2 tablet Oral QHS  . simvastatin  20 mg Oral q1800  . triamterene-hydrochlorothiazide  1 tablet Oral Daily       PRN Meds: acetaminophen, hydrALAZINE, ondansetron **OR** ondansetron (ZOFRAN) IV, polyethylene glycol  Physical Exam Constitutional:      General: She is not in acute distress. HENT:     Head: Normocephalic and atraumatic.  Pulmonary:     Effort: Pulmonary effort is normal.  Neurological:     Mental Status: She is alert and oriented to person, place, and time.     Motor: Weakness present.  Psychiatric:        Speech: Speech normal.        Behavior: Behavior normal.             Vital Signs: BP (!) 125/97 (BP Location: Left Arm)   Pulse (!) 103   Temp 98.2 F (36.8 C) (Oral)   Resp 16   Wt 47.2 kg   SpO2 98%   BMI 21.01 kg/m  SpO2: SpO2: 98 % O2 Device: O2 Device: Room Air O2 Flow Rate:    Intake/output summary:   Intake/Output Summary (Last 24 hours) at 08/02/2020 1441 Last data filed at 08/02/2020 1008 Gross per 24 hour  Intake 195 ml  Output 350 ml  Net -155 ml   LBM: Last BM Date: 07/30/20 Baseline Weight: Weight: 47.2 kg Most recent weight: Weight: 47.2 kg       Palliative Assessment/Data: PPS 50%        Palliative Care Assessment & Plan   Patient Profile: 86 y.o.femalewith past medical history of HTN, hyperlipidemia, osteoporosis, GERD, chronic back pain, long-term narcotic dependence. She presented to [facility] 10/20/2021with a fall.Per intake H&P, patient had taken Ativan, which was recently prescribed by her PCP for difficulty sleeping. Daughter reported that patient appeared to be confused and was having urinary frequency the day before.  ED Course:CT head negative for injuries, maxillary and facial CT showed acute mild displaced left nasal fracture. CT lumbar spineshows subacute L1 compression fracture, spinal stenosis (L3-4 and L4-5), and bulging disc ((L3-4 and L5-S1). Urinalysis shows large leukocytes and positive nitrite. Potassium 2.9 Admitted to [facility] for management of fall, UTI, hypokalemia, lumbar spine fracture, and uncontrolled  HTN.  Assessment: Fall at home Ambulatory dysfunction Hypokalemia UTI Lumbar spine compression fractures and stenosis Generalized weakness Severe malnutrition  Recommendations/Plan:  Continue current medical treatment  Continue mirtazapine 15 mg nightly   Continue fentanyl 100 mcg/hr every 72 hours  Continue scheduled tylenol  Continue lidocaine patches daily  Outpatient palliative follow-up has been arranged with [facility]  Goals of Care and Additional Recommendations (per MOST form completed 07/28/20):  Cardiopulmonary Resuscitation: Do Not Attempt Resuscitation (DNR/No CPR)  Medical Interventions: Comfort Measures: Keep clean, warm, and dry. Use medication by any route, positioning, wound care, and other measures to relieve pain and suffering. Use oxygen, suction and manual treatment of airway obstruction as needed for comfort. Do not transfer to the hospital unless comfort needs cannot be met in current location.  Antibiotics: Antibiotics if indicated  IV Fluids: IV fluids for a defined trial period  Feeding Tube: No feeding tube     Code Status: DNR/DNI  Prognosis:   Unable to determine  Discharge Planning:  [facility] for rehab with Palliative care service follow-up    Thank you for allowing the Palliative Medicine Team to assist in the care of this patient.   Total Time 25 minutes Prolonged Time Billed  no       Greater than 50%  of this time was spent counseling and coordinating care related to the above assessment and plan.  Lavena Bullion, NP  Please contact Palliative Medicine Team phone at [phone] for questions and concerns.

## 2020-08-02 NOTE — Discharge Summary (Signed)
Physician Discharge Summary  Claudia [name] OFB:[id number] DOB: 1931/09/25 DOA: 07/28/2020  PCP: [name], MD  Admit date: 07/28/2020 Discharge date: 08/02/2020  Admitted From: home Disposition:  SNF  Recommendations for Outpatient Follow-up:  1. Follow up with PCP in 1-2 weeks 2. Please obtain BMP/CBC in one week 3. Please follow up on the following pending results:  Home Health:NO  Equipment/Devices: NONE  Discharge Condition: Stable Code Status:   Code Status: DNR Diet recommendation:  Diet Order            Diet - low sodium heart healthy           Diet regular Room service appropriate? Yes with Assist; Fluid consistency: Thin  Diet effective now                 Brief/Interim Summary:  86 y.o. femalewith medical history significant ofHTN,chronic narcotic dependence, chronic back pain, hyperlipidemia, osteoporosis, GERD, presented with multiple falls. Patient baseline ambulation dysfunction, depends on walker,and she lives by herself with a disabled 84 year old adopted daughter who needs around-the-clock care, patient does have home health aide daytime and her other daughter stopsby in the evenings. It appears that patient has had trouble for sleep for couple weeks and went to her PCP on Monday, who prescribed patient with Ativan 0.5 mg which patient started to take last night. Several hours after patient took Ativan she sustained 3 episodes of fall. Patient described her legs were feeling weak but denied any specifically one-sided weaker than the other. Her daughter reported over the phone that the patient appeared to be confused last night and before that she has had urinary frequency the whole day yesterday. Patient denied any dysuria, no fever chills no abdominal pain. Another problem patient has is she has been losing weight, she has had poor appetite for several months,she denied of feeling nauseous vomiting or abdominal pain. Patient has cataract  on the left eye for which she had surgery 3 months ago and she reported her eyesight has been sufficient. ED Course:CT head negative for injuries,maxillary and facial CT showed acute mild displaced left nasal fracture.Lumbar spine L1 compression fracture subacute, L3-L4,L4-L5 bulging disc and stenosis of canal and foramina. Blood work, potassium 2.9. CK 300. Patient was admitted treated with IV antibiotics for UTI.  Lidocaine patch pain management at rest and overall doing well.  Seen by PT OT and has advised skilled nursing facility, initially reluctant but subsequently agreed for skilled nursing facility.  Patient was seen by palliative care. Patient is medically stable for discharge to skilled nursing facility, she is completing her antibiotics.  Discharge Diagnoses:   Fall at home, initial encounter  With acute on chronic ambulatory dysfunction, multifactorial in the setting of UTI deconditioning, old age,polypharmacy with benzo and narcotic.  Patient on 0.5 mg ativan at bedtime, fentanyl 100 MCG patch every other day. Ativan discontinued on admission.  Has chronic pain issues. Seen by by palliative care, continue PT OT and skilled nursing facility disposition.   disposition.   UTI: Urine culture with multiple species, completing antibiotics.  Lumbar spine compression fractures and stenosis : Pain is controlled on fentanyl, lidocaine continue PT OT.   Chronic pain is controlled on her fentanyl patch and lidocaine patch. Debility/conditioning: Continue PT OT, appreciate speech input.  Added Remeron.  Hypokalemia: Resolved. Hypertension BP is controlled on Cardizem and  HCTZ.   Hyperlipidemia continue simvastatin  GOC: DNR.  Followed by palliative care.  Had extensive just with patient's daughter today high  risk for readmission.Daughter wants her to go to skilled nursing facility, [facility] consulted  Consults:  Palliative care  Subjective: Alert awake, somewhat anxious, waiting for  placement. Discharge Exam: Vitals:   08/01/20 2028 08/02/20 0553  BP: 124/70 112/65  Pulse: 90 83  Resp: 20 20  Temp:  98.2 F (36.8 C)  SpO2: 97% 97%   General: Pt is alert, awake, not in acute distress Cardiovascular: RRR, S1/S2 +, no rubs, no gallops Respiratory: CTA bilaterally, no wheezing, no rhonchi Abdominal: Soft, NT, ND, bowel sounds + Extremities: no edema, no cyanosis  Discharge Instructions  Discharge Instructions    Amb Referral to Palliative Care   Complete by: As directed    Diet - low sodium heart healthy   Complete by: As directed    Discharge instructions   Complete by: As directed    Please call call MD or return to ER for similar or worsening recurring problem that brought you to hospital or if any fever,nausea/vomiting,abdominal pain, uncontrolled pain, chest pain,  shortness of breath or any other alarming symptoms.  Please follow-up your doctor as instructed in a week time and call the office for appointment.  Please avoid alcohol, smoking, or any other illicit substance and maintain healthy habits including taking your regular medications as prescribed.  You were cared for by a hospitalist during your hospital stay. If you have any questions about your discharge medications or the care you received while you were in the hospital after you are discharged, you can call the unit and ask to speak with the hospitalist on call if the hospitalist that took care of you is not available.  Once you are discharged, your primary care physician will handle any further medical issues. Please note that NO REFILLS for any discharge medications will be authorized once you are discharged, as it is imperative that you return to your primary care physician (or establish a relationship with a primary care physician if you do not have one) for your aftercare needs so that they can reassess your need for medications and monitor your lab values   Increase activity slowly    Complete by: As directed      Allergies as of 08/02/2020      Reactions   Amitiza [lubiprostone] Nausea And Vomiting   Benadryl [diphenhydramine] Other (See Comments)   Makes me hyper   Ultram [tramadol]    Made sick to stomach   Aspirin Other (See Comments)   Upsets stomach, and nervousness. "I feel like I'm going to scream."   Ibuprofen Other (See Comments)   Jittery, nervousness, similar to aspirin   Sulfa Antibiotics Nausea And Vomiting, Rash      Medication List    STOP taking these medications   LORazepam 0.5 MG tablet Commonly known as: ATIVAN     TAKE these medications   acetaminophen 500 MG tablet Commonly known as: TYLENOL Take 1,000 mg by mouth every 6 (six) hours as needed for moderate pain.   cloNIDine 0.1 MG tablet Commonly known as: CATAPRES TAKE 1 TABLET DAILY FOR BLOOD PRESSURE>180 OR >100. What changed: See the new instructions.   diltiazem 120 MG 24 hr capsule Commonly known as: CARDIZEM CD TAKE 2 CAPSULES DAILY. What changed:   how much to take  when to take this   DSS 100 MG Caps Take 100 mg by mouth daily.   fentaNYL 100 MCG/HR Commonly known as: DURAGESIC Place 1 patch onto the skin every 3 (three) days for 1 dose.  APPLY 1 PATCH EVERY OTHER DAY AS DIRECTED What changed: See the new instructions.   lidocaine 5 % Commonly known as: LIDODERM Place 2 patches onto the skin daily. Remove & Discard patch within 12 hours or as directed by MD   melatonin 3 MG Tabs tablet Take 1 tablet (3 mg total) by mouth at bedtime.   mirtazapine 15 MG tablet Commonly known as: REMERON Take 1 tablet (15 mg total) by mouth at bedtime.   pantoprazole 40 MG tablet Commonly known as: PROTONIX TAKE 1 TABLET EACH DAY. What changed: See the new instructions.   polyethylene glycol powder 17 GM/SCOOP powder Commonly known as: GLYCOLAX/MIRALAX TAKE 17 GRAMS TWICE A DAY AS NEEDED What changed: See the new instructions.   simvastatin 20 MG tablet Commonly  known as: ZOCOR TAKE 1 TABLET IN THE P.M. What changed: See the new instructions.   triamterene-hydrochlorothiazide 37.5-25 MG tablet Commonly known as: MAXZIDE-25 TAKE 1 TABLET EACH DAY. What changed: See the new instructions.   Xiidra 5 % Soln Generic drug: Lifitegrast Place 1 drop into both eyes 2 (two) times daily.   Xiidra 5 % Soln Generic drug: Lifitegrast Place 1 drop into both eyes in the morning and at bedtime.       Follow-up Information    [facility] Follow up.   Contact information: [address] [facility] [place] [address]       [facility], [facility] Follow up.   Specialty: Home Health Services Contact information: [address] [place] [address] [phone]              Allergies  Allergen Reactions  . Amitiza [Lubiprostone] Nausea And Vomiting  . Benadryl [Diphenhydramine] Other (See Comments)    Makes me hyper  . Ultram [Tramadol]     Made sick to stomach  . Aspirin Other (See Comments)    Upsets stomach, and nervousness. "I feel like I'm going to scream."  . Ibuprofen Other (See Comments)    Jittery, nervousness, similar to aspirin  . Sulfa Antibiotics Nausea And Vomiting and Rash    The results of significant diagnostics from this hospitalization (including imaging, microbiology, ancillary and laboratory) are listed below for reference.    Microbiology: Recent Results (from the past 240 hour(s))  Respiratory Panel by RT PCR (Flu A&B, Covid) - Nasopharyngeal Swab     Status: None   Collection Time: 07/28/20 12:10 PM   Specimen: Nasopharyngeal Swab  Result Value Ref Range Status   SARS Coronavirus 2 by RT PCR NEGATIVE NEGATIVE Final    Comment: (NOTE) SARS-CoV-2 target nucleic acids are NOT DETECTED.  The SARS-CoV-2 RNA is generally detectable in upper respiratoy specimens during the acute phase of infection. The lowest concentration of SARS-CoV-2 viral copies this assay can detect is 131  copies/mL. A negative result does not preclude SARS-Cov-2 infection and should not be used as the sole basis for treatment or other patient management decisions. A negative result may occur with  improper specimen collection/handling, submission of specimen other than nasopharyngeal swab, presence of viral mutation(s) within the areas targeted by this assay, and inadequate number of viral copies (<131 copies/mL). A negative result must be combined with clinical observations, patient history, and epidemiological information. The expected result is Negative.  Fact Sheet for Patients:  https://www.moore.com/  Fact Sheet for Healthcare Providers:  https://www.young.biz/  This test is no t yet approved or cleared by the [place] FDA and  has been authorized for detection and/or diagnosis of  SARS-CoV-2 by FDA under an Emergency Use Authorization (EUA). This EUA will remain  in effect (meaning this test can be used) for the duration of the COVID-19 declaration under Section 564(b)(1) of the Act, 21 U.S.C. section 360bbb-3(b)(1), unless the authorization is terminated or revoked sooner.     Influenza A by PCR NEGATIVE NEGATIVE Final   Influenza B by PCR NEGATIVE NEGATIVE Final    Comment: (NOTE) The Xpert Xpress SARS-CoV-2/FLU/RSV assay is intended as an aid in  the diagnosis of influenza from Nasopharyngeal swab specimens and  should not be used as a sole basis for treatment. Nasal washings and  aspirates are unacceptable for Xpert Xpress SARS-CoV-2/FLU/RSV  testing.  Fact Sheet for Patients: https://www.moore.com/  Fact Sheet for Healthcare Providers: https://www.young.biz/  This test is not yet approved or cleared by the [place] FDA and  has been authorized for detection and/or diagnosis of SARS-CoV-2 by  FDA under an Emergency Use Authorization (EUA). This EUA will remain  in effect (meaning  this test can be used) for the duration of the  Covid-19 declaration under Section 564(b)(1) of the Act, 21  U.S.C. section 360bbb-3(b)(1), unless the authorization is  terminated or revoked. Performed at [facility], [address], [place], [place] [address]   Urine culture     Status: Abnormal   Collection Time: 07/28/20 12:42 PM   Specimen: Urine, Random  Result Value Ref Range Status   Specimen Description URINE, RANDOM  Final   Special Requests   Final    NONE Performed at [facility], [address] [place], [place] [address]    Culture MULTIPLE SPECIES PRESENT, SUGGEST RECOLLECTION (A)  Final   Report Status 07/29/2020 FINAL  Final  C Difficile Quick Screen w PCR reflex     Status: None   Collection Time: 07/30/20 10:49 AM   Specimen: STOOL  Result Value Ref Range Status   C Diff antigen NEGATIVE NEGATIVE Final   C Diff toxin NEGATIVE NEGATIVE Final   C Diff interpretation No C. difficile detected.  Final    Comment: Performed at [facility], [address] [place], [place] [address]    Procedures/Studies: DG Chest 2 View  Result Date: 07/28/2020 CLINICAL DATA:  Fall, back pain EXAM: CHEST - 2 VIEW COMPARISON:  04/04/2015 FINDINGS: Lungs are clear. No pneumothorax or pleural effusion. Cardiomediastinal silhouette unremarkable. Pulmonary vascularity is normal. Moderate to severe degenerative changes are seen within the thoracic spine, more severe within the midthoracic spine. No acute bone abnormality identified. Right total shoulder arthroplasty has been performed. IMPRESSION: No active cardiopulmonary disease. Electronically Signed   By: Helyn Numbers MD   On: 07/28/2020 10:44   DG Lumbar Spine Complete  Result Date: 07/28/2020 CLINICAL DATA:  Fall, back pain EXAM: LUMBAR SPINE - COMPLETE 4+ VIEW COMPARISON:  CT abdomen pelvis 03/08/2020 FINDINGS: There is normal lumbar lordosis. There is interval development of a compression fracture of L1 with  approximately 50% loss of height. No retropulsion. Stable grade 1 anterolisthesis of L3 upon L4 and L4 upon L5. Stable minimal retrolisthesis of L5 upon S1. Advanced degenerative disc disease is noted at L4-5 with changes of mild degenerative disc disease noted at L3-4 and L5-S1. Remaining intervertebral disc heights and vertebral body height appears preserved. Oblique views demonstrate no evidence of pars defect. Advanced atherosclerotic calcification is seen within the abdominal aorta without evidence of aneurysm. The paraspinal soft tissues are unremarkable. IMPRESSION: Interval L1 compression fracture, age indeterminate  but new since 03/08/2020. Correlation for point tenderness would be helpful in determining acuity. MRI examination may also be more helpful in determining the acuity of this fracture. Stable degenerative changes L3-S1, most severe at L4-5. Electronically Signed   By: Helyn Numbers MD   On: 07/28/2020 10:49   CT Head Wo Contrast  Result Date: 07/28/2020 CLINICAL DATA:  Head trauma, minor. Facial trauma. Neck trauma. Additional history provided: Fall, striking back of head. EXAM: CT HEAD WITHOUT CONTRAST CT MAXILLOFACIAL WITHOUT CONTRAST CT CERVICAL SPINE WITHOUT CONTRAST TECHNIQUE: Multidetector CT imaging of the head, cervical spine, and maxillofacial structures were performed using the standard protocol without intravenous contrast. Multiplanar CT image reconstructions of the cervical spine and maxillofacial structures were also generated. COMPARISON:  Prior head CT 03/08/2020. Radiographs of the cervical spine 12/23/2006. Maxillofacial CT 12/24/2016. FINDINGS: CT HEAD FINDINGS Brain: Generalized cerebral atrophy. Mild ill-defined hypoattenuation within cerebral white matter is nonspecific, but consistent with chronic small vessel ischemic disease. There is no acute intracranial hemorrhage. No demarcated cortical infarct. No extra-axial fluid collection. No evidence of intracranial mass.  No midline shift. Vascular: No hyperdense vessel.  Atherosclerotic calcifications. Skull: Normal. Negative for fracture or focal lesion. Other: No significant mastoid effusion. CT MAXILLOFACIAL FINDINGS Osseous: There is a mildly displaced fracture deformity of the left nasal bone which is new as compared to the prior maxillofacial CT of 12/24/2016 and may be acute. Elsewhere, no acute maxillofacial fracture is identified. Orbits: Acute finding. Redemonstrated anterior globe calcifications bilaterally. The globes are normal in size and contour. The extraocular muscles and optic nerve sheath complexes are symmetric and unremarkable. Sinuses: Minimal ethmoid sinus mucosal thickening. Soft tissues: Subtle soft tissue swelling is questioned overlying the nose. CT CERVICAL SPINE FINDINGS Alignment: Straightening of the expected cervical lordosis. 2 mm C7-T1 grade 1 anterolisthesis. Skull base and vertebrae: The basion-dental and atlanto-dental intervals are maintained.No evidence of acute fracture to the cervical spine. Soft tissues and spinal canal: No prevertebral fluid or swelling. No visible canal hematoma. Disc levels: C3-C4 and C4-C5 vertebral fusion. Cervical spondylosis with multilevel disc space narrowing at the remaining levels, disc bulges, posterior disc osteophytes, uncovertebral hypertrophy and facet arthrosis. No high-grade bony spinal canal stenosis. Multilevel bony neural foraminal narrowing. Upper chest: No consolidation within the imaged lung apices. No visible pneumothorax. IMPRESSION: CT head: 1. No evidence of acute intracranial abnormality. 2. Generalized cerebral atrophy and mild chronic small vessel ischemic disease, stable as compared to the head CT of 03/08/2020. CT maxillofacial: 1. A mildly displaced fracture deformity of the left nasal bone is new as compared to the maxillofacial CT of 12/24/2016, and may be acute. Clinical correlation is recommended. Question mild overlying soft tissue  swelling. 2. Minimal ethmoid sinus mucosal thickening. CT cervical spine: 1. No evidence of acute fracture to the cervical spine. 2. 2 mm C7-T1 grade 1 anterolisthesis. 3. C3-C4 and C4-C5 vertebral fusion. 4. Cervical spondylosis as described. Electronically Signed   By: Jackey Loge DO   On: 07/28/2020 11:15   CT Cervical Spine Wo Contrast  Result Date: 07/28/2020 CLINICAL DATA:  Head trauma, minor. Facial trauma. Neck trauma. Additional history provided: Fall, striking back of head. EXAM: CT HEAD WITHOUT CONTRAST CT MAXILLOFACIAL WITHOUT CONTRAST CT CERVICAL SPINE WITHOUT CONTRAST TECHNIQUE: Multidetector CT imaging of the head, cervical spine, and maxillofacial structures were performed using the standard protocol without intravenous contrast. Multiplanar CT image reconstructions of the cervical spine and maxillofacial structures were also generated. COMPARISON:  Prior head CT 03/08/2020. Radiographs of  the cervical spine 12/23/2006. Maxillofacial CT 12/24/2016. FINDINGS: CT HEAD FINDINGS Brain: Generalized cerebral atrophy. Mild ill-defined hypoattenuation within cerebral white matter is nonspecific, but consistent with chronic small vessel ischemic disease. There is no acute intracranial hemorrhage. No demarcated cortical infarct. No extra-axial fluid collection. No evidence of intracranial mass. No midline shift. Vascular: No hyperdense vessel.  Atherosclerotic calcifications. Skull: Normal. Negative for fracture or focal lesion. Other: No significant mastoid effusion. CT MAXILLOFACIAL FINDINGS Osseous: There is a mildly displaced fracture deformity of the left nasal bone which is new as compared to the prior maxillofacial CT of 12/24/2016 and may be acute. Elsewhere, no acute maxillofacial fracture is identified. Orbits: Acute finding. Redemonstrated anterior globe calcifications bilaterally. The globes are normal in size and contour. The extraocular muscles and optic nerve sheath complexes are symmetric  and unremarkable. Sinuses: Minimal ethmoid sinus mucosal thickening. Soft tissues: Subtle soft tissue swelling is questioned overlying the nose. CT CERVICAL SPINE FINDINGS Alignment: Straightening of the expected cervical lordosis. 2 mm C7-T1 grade 1 anterolisthesis. Skull base and vertebrae: The basion-dental and atlanto-dental intervals are maintained.No evidence of acute fracture to the cervical spine. Soft tissues and spinal canal: No prevertebral fluid or swelling. No visible canal hematoma. Disc levels: C3-C4 and C4-C5 vertebral fusion. Cervical spondylosis with multilevel disc space narrowing at the remaining levels, disc bulges, posterior disc osteophytes, uncovertebral hypertrophy and facet arthrosis. No high-grade bony spinal canal stenosis. Multilevel bony neural foraminal narrowing. Upper chest: No consolidation within the imaged lung apices. No visible pneumothorax. IMPRESSION: CT head: 1. No evidence of acute intracranial abnormality. 2. Generalized cerebral atrophy and mild chronic small vessel ischemic disease, stable as compared to the head CT of 03/08/2020. CT maxillofacial: 1. A mildly displaced fracture deformity of the left nasal bone is new as compared to the maxillofacial CT of 12/24/2016, and may be acute. Clinical correlation is recommended. Question mild overlying soft tissue swelling. 2. Minimal ethmoid sinus mucosal thickening. CT cervical spine: 1. No evidence of acute fracture to the cervical spine. 2. 2 mm C7-T1 grade 1 anterolisthesis. 3. C3-C4 and C4-C5 vertebral fusion. 4. Cervical spondylosis as described. Electronically Signed   By: Jackey Loge DO   On: 07/28/2020 11:15   CT Lumbar Spine Wo Contrast  Result Date: 07/28/2020 CLINICAL DATA:  Fall.  L1 compression fracture. EXAM: CT LUMBAR SPINE WITHOUT CONTRAST TECHNIQUE: Multidetector CT imaging of the lumbar spine was performed without intravenous contrast administration. Multiplanar CT image reconstructions were also  generated. COMPARISON:  Previous CT 03/08/2020. Lumbar radiography earlier same day. FINDINGS: Segmentation: 5 lumbar type vertebral bodies Alignment: Thoracolumbar curvature convex to the right and lower lumbar curvature convex to the left. Leftward translation of L4 relative to L5 of 1 cm. Anterolisthesis of 4 mm at L3-4. Anterolisthesis of 12 mm at L4-5. Vertebrae: Compression fracture at L1 with loss of height of 60%. Posterior bowing of the posterosuperior margin of the vertebral body. I favor that this is late subacute and essentially healed, though it was not present on 03/08/2020. No discrete fracture line. No paravertebral soft tissue swelling. Chronic discogenic endplate changes at L4 and L5 especially on the right. No acute regional fracture is seen. Paraspinal and other soft tissues: Aortic atherosclerosis. 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 Disc levels: T12-L1: Posterior bowing of the posterosuperior margin of the vertebral body indents the thecal sac slightly but does not cause neural compressive stenosis. L1-2: Normal interspace. L2-3: Normal interspace. L3-4: Bilateral facet arthropathy with 4 mm  of anterolisthesis. Bulging of the disc. Stenosis of both lateral recesses and of the intervertebral foramen on the right. L4-5: Anterolisthesis of 12 mm due to chronic facet arthropathy. Protrusion of the disc. Severe stenosis of the canal and foramina, right worse than left. L5-S1: Bulging of the disc. Mild facet degeneration. No canal stenosis. Mild bilateral foraminal narrowing. IMPRESSION: 1. Compression fracture at L1 with loss of height of 60%. Posterior bowing of the posterosuperior margin of the vertebral body. I favor that this is late subacute and essentially healed, though it was not present on 03/08/2020. No discrete fracture line. No paravertebral soft tissue swelling. 2. L3-4: Bilateral facet arthropathy with 4 mm of anterolisthesis. Bulging of the disc. Stenosis  of both lateral recesses and of the intervertebral foramen on the right. Findings could cause neural compression on the right. 3. L4-5: Anterolisthesis of 12 mm due to chronic facet arthropathy. Protrusion of the disc. Severe stenosis of the canal and foramina, right worse than left. Neural compression quite possible at this level. 4. L5-S1: Bulging of the disc. Mild facet degeneration. Mild bilateral foraminal narrowing. 5. 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 6. Aortic atherosclerosis. Aortic Atherosclerosis (ICD10-I70.0). Electronically Signed   By: Paulina Fusi M.D.   On: 07/28/2020 12:07   CT Maxillofacial Wo Contrast  Result Date: 07/28/2020 CLINICAL DATA:  Head trauma, minor. Facial trauma. Neck trauma. Additional history provided: Fall, striking back of head. EXAM: CT HEAD WITHOUT CONTRAST CT MAXILLOFACIAL WITHOUT CONTRAST CT CERVICAL SPINE WITHOUT CONTRAST TECHNIQUE: Multidetector CT imaging of the head, cervical spine, and maxillofacial structures were performed using the standard protocol without intravenous contrast. Multiplanar CT image reconstructions of the cervical spine and maxillofacial structures were also generated. COMPARISON:  Prior head CT 03/08/2020. Radiographs of the cervical spine 12/23/2006. Maxillofacial CT 12/24/2016. FINDINGS: CT HEAD FINDINGS Brain: Generalized cerebral atrophy. Mild ill-defined hypoattenuation within cerebral white matter is nonspecific, but consistent with chronic small vessel ischemic disease. There is no acute intracranial hemorrhage. No demarcated cortical infarct. No extra-axial fluid collection. No evidence of intracranial mass. No midline shift. Vascular: No hyperdense vessel.  Atherosclerotic calcifications. Skull: Normal. Negative for fracture or focal lesion. Other: No significant mastoid effusion. CT MAXILLOFACIAL FINDINGS Osseous: There is a mildly displaced fracture deformity of the left nasal bone which is new as  compared to the prior maxillofacial CT of 12/24/2016 and may be acute. Elsewhere, no acute maxillofacial fracture is identified. Orbits: Acute finding. Redemonstrated anterior globe calcifications bilaterally. The globes are normal in size and contour. The extraocular muscles and optic nerve sheath complexes are symmetric and unremarkable. Sinuses: Minimal ethmoid sinus mucosal thickening. Soft tissues: Subtle soft tissue swelling is questioned overlying the nose. CT CERVICAL SPINE FINDINGS Alignment: Straightening of the expected cervical lordosis. 2 mm C7-T1 grade 1 anterolisthesis. Skull base and vertebrae: The basion-dental and atlanto-dental intervals are maintained.No evidence of acute fracture to the cervical spine. Soft tissues and spinal canal: No prevertebral fluid or swelling. No visible canal hematoma. Disc levels: C3-C4 and C4-C5 vertebral fusion. Cervical spondylosis with multilevel disc space narrowing at the remaining levels, disc bulges, posterior disc osteophytes, uncovertebral hypertrophy and facet arthrosis. No high-grade bony spinal canal stenosis. Multilevel bony neural foraminal narrowing. Upper chest: No consolidation within the imaged lung apices. No visible pneumothorax. IMPRESSION: CT head: 1. No evidence of acute intracranial abnormality. 2. Generalized cerebral atrophy and mild chronic small vessel ischemic disease, stable as compared to the head CT of 03/08/2020. CT maxillofacial: 1. A  mildly displaced fracture deformity of the left nasal bone is new as compared to the maxillofacial CT of 12/24/2016, and may be acute. Clinical correlation is recommended. Question mild overlying soft tissue swelling. 2. Minimal ethmoid sinus mucosal thickening. CT cervical spine: 1. No evidence of acute fracture to the cervical spine. 2. 2 mm C7-T1 grade 1 anterolisthesis. 3. C3-C4 and C4-C5 vertebral fusion. 4. Cervical spondylosis as described. Electronically Signed   By: Jackey Loge DO   On:  07/28/2020 11:15    Labs: BNP (last 3 results) No results for input(s): BNP in the last 8760 hours. Basic Metabolic Panel: Recent Labs  Lab 07/28/20 1017 07/28/20 1530 07/29/20 0251 07/30/20 1010  NA 140  --  139 141  K 2.9*  --  3.1* 3.6  CL 102  --  106 104  CO2 26  --  20* 25  GLUCOSE 104*  --  71 124*  BUN 15  --  15 19  CREATININE 0.98  --  0.85 0.90  CALCIUM 9.2  --  8.6* 9.1  MG  --  2.1  --   --   PHOS  --  3.4  --   --    Liver Function Tests: No results for input(s): AST, ALT, ALKPHOS, BILITOT, PROT, ALBUMIN in the last 168 hours. No results for input(s): LIPASE, AMYLASE in the last 168 hours. No results for input(s): AMMONIA in the last 168 hours. CBC: Recent Labs  Lab 07/28/20 1017  WBC 6.9  NEUTROABS 4.8  HGB 13.9  HCT 42.9  MCV 86.8  PLT 274   Cardiac Enzymes: Recent Labs  Lab 07/28/20 1017  CKTOTAL 324*   BNP: Invalid input(s): POCBNP CBG: Recent Labs  Lab 07/28/20 0952  GLUCAP 93   D-Dimer No results for input(s): DDIMER in the last 72 hours. Hgb A1c No results for input(s): HGBA1C in the last 72 hours. Lipid Profile No results for input(s): CHOL, HDL, LDLCALC, TRIG, CHOLHDL, LDLDIRECT in the last 72 hours. Thyroid function studies No results for input(s): TSH, T4TOTAL, T3FREE, THYROIDAB in the last 72 hours.  Invalid input(s): FREET3 Anemia work up No results for input(s): VITAMINB12, FOLATE, FERRITIN, TIBC, IRON, RETICCTPCT in the last 72 hours. Urinalysis    Component Value Date/Time   COLORURINE YELLOW 07/28/2020 1230   APPEARANCEUR HAZY (A) 07/28/2020 1230   LABSPEC 1.006 07/28/2020 1230   PHURINE 6.0 07/28/2020 1230   GLUCOSEU NEGATIVE 07/28/2020 1230   HGBUR SMALL (A) 07/28/2020 1230   BILIRUBINUR NEGATIVE 07/28/2020 1230   BILIRUBINUR Neg 12/16/2018 1328   KETONESUR NEGATIVE 07/28/2020 1230   PROTEINUR NEGATIVE 07/28/2020 1230   UROBILINOGEN 0.2 12/16/2018 1328   UROBILINOGEN 0.2 04/04/2015 0942   NITRITE POSITIVE  (A) 07/28/2020 1230   LEUKOCYTESUR LARGE (A) 07/28/2020 1230   Sepsis Labs Invalid input(s): PROCALCITONIN,  WBC,  LACTICIDVEN Microbiology Recent Results (from the past 240 hour(s))  Respiratory Panel by RT PCR (Flu A&B, Covid) - Nasopharyngeal Swab     Status: None   Collection Time: 07/28/20 12:10 PM   Specimen: Nasopharyngeal Swab  Result Value Ref Range Status   SARS Coronavirus 2 by RT PCR NEGATIVE NEGATIVE Final    Comment: (NOTE) SARS-CoV-2 target nucleic acids are NOT DETECTED.  The SARS-CoV-2 RNA is generally detectable in upper respiratoy specimens during the acute phase of infection. The lowest concentration of SARS-CoV-2 viral copies this assay can detect is 131 copies/mL. A negative result does not preclude SARS-Cov-2 infection and should not be used as  the sole basis for treatment or other patient management decisions. A negative result may occur with  improper specimen collection/handling, submission of specimen other than nasopharyngeal swab, presence of viral mutation(s) within the areas targeted by this assay, and inadequate number of viral copies (<131 copies/mL). A negative result must be combined with clinical observations, patient history, and epidemiological information. The expected result is Negative.  Fact Sheet for Patients:  https://www.moore.com/https://www.fda.gov/media/142436/download  Fact Sheet for Healthcare Providers:  https://www.young.biz/https://www.fda.gov/media/142435/download  This test is no t yet approved or cleared by the [place] FDA and  has been authorized for detection and/or diagnosis of SARS-CoV-2 by FDA under an Emergency Use Authorization (EUA). This EUA will remain  in effect (meaning this test can be used) for the duration of the COVID-19 declaration under Section 564(b)(1) of the Act, 21 U.S.C. section 360bbb-3(b)(1), unless the authorization is terminated or revoked sooner.     Influenza A by PCR NEGATIVE NEGATIVE Final   Influenza B by PCR NEGATIVE  NEGATIVE Final    Comment: (NOTE) The Xpert Xpress SARS-CoV-2/FLU/RSV assay is intended as an aid in  the diagnosis of influenza from Nasopharyngeal swab specimens and  should not be used as a sole basis for treatment. Nasal washings and  aspirates are unacceptable for Xpert Xpress SARS-CoV-2/FLU/RSV  testing.  Fact Sheet for Patients: https://www.moore.com/https://www.fda.gov/media/142436/download  Fact Sheet for Healthcare Providers: https://www.young.biz/https://www.fda.gov/media/142435/download  This test is not yet approved or cleared by the [place] FDA and  has been authorized for detection and/or diagnosis of SARS-CoV-2 by  FDA under an Emergency Use Authorization (EUA). This EUA will remain  in effect (meaning this test can be used) for the duration of the  Covid-19 declaration under Section 564(b)(1) of the Act, 21  U.S.C. section 360bbb-3(b)(1), unless the authorization is  terminated or revoked. Performed at [facility], [address] [place], [place] [address]   Urine culture     Status: Abnormal   Collection Time: 07/28/20 12:42 PM   Specimen: Urine, Random  Result Value Ref Range Status   Specimen Description URINE, RANDOM  Final   Special Requests   Final    NONE Performed at [facility], [address] [place], [place] [address]    Culture MULTIPLE SPECIES PRESENT, SUGGEST RECOLLECTION (A)  Final   Report Status 07/29/2020 FINAL  Final  C Difficile Quick Screen w PCR reflex     Status: None   Collection Time: 07/30/20 10:49 AM   Specimen: STOOL  Result Value Ref Range Status   C Diff antigen NEGATIVE NEGATIVE Final   C Diff toxin NEGATIVE NEGATIVE Final   C Diff interpretation No C. difficile detected.  Final    Comment: Performed at [facility], [address] [place], [place] [address]     Time coordinating discharge: 25 minutes  SIGNED: Lanae Boastamesh Kaiea Esselman, MD  [facility] 08/02/2020, 12:02 PM  If 7PM-7AM, please contact night-coverage [email or web address]

## 2020-08-02 NOTE — Progress Notes (Signed)
Claudia [name] to be D/C'd per MD order.   Skin clean, dry, and intact without evidence of skin break down, no evidence of skin tears noted.  IV catheter discontinued intact. Site without signs and symptoms of complications. Dressing and pressure applied.  An After Visit Summary was printed and given to transport team.  Patient to be escorted via WC, and D/C to [facility].

## 2020-08-02 NOTE — TOC Progression Note (Signed)
Transition of Care [facility]) - Progression Note    Patient Details  Name: Claudia [name] MRN: [id number] Date of Birth: 15-May-1931  Transition [facility]) CM/SW [place], [place] Phone Number: 08/02/2020, 12:19 PM  Clinical Narrative:     CSW met with pt with SNF offers. Pt explains she is not excited to go to SNF but understands she needs to. She chooses [facility]. Pt is vaccinated. She expresses a desire to leave the hospital as soon as possible. She consents to [place] contacting her daughter.   CSW contacted pt's daughter who is okay with [facility] SNF. CSW will update when transport is ordered.   CSW confirmed with Juliann Pulse from [facility] for bed pending rapid covid.   CSW updated auth. With facility. Auth is approved for 10/25 -- 10/27 with Ref# [id number].  Expected Discharge Plan: [place] Barriers to Discharge: Continued Medical Work up  Expected Discharge Plan and Services Expected Discharge Plan: [place] Choice: [place] arrangements for the past 2 months: Single Family Home Expected Discharge Date: 08/02/20               DME Arranged: N/A         HH Arranged: Nurse's Aide, PT           Social Determinants of Health (SDOH) Interventions    Readmission Risk Interventions No flowsheet data found.

## 2020-08-10 ENCOUNTER — Encounter (HOSPITAL_COMMUNITY): Payer: Self-pay

## 2020-08-10 ENCOUNTER — Other Ambulatory Visit: Payer: Self-pay

## 2020-08-10 ENCOUNTER — Emergency Department (HOSPITAL_COMMUNITY): Payer: Medicare Other

## 2020-08-10 ENCOUNTER — Emergency Department (HOSPITAL_COMMUNITY)
Admission: EM | Admit: 2020-08-10 | Discharge: 2020-08-10 | Disposition: A | Discharge status: Home / Self Care | Payer: Medicare Other | Attending: Emergency Medicine | Admitting: Emergency Medicine

## 2020-08-10 DIAGNOSIS — W19XXXA Unspecified fall, initial encounter: Secondary | ICD-10-CM | POA: Insufficient documentation

## 2020-08-10 DIAGNOSIS — M25511 Pain in right shoulder: Secondary | ICD-10-CM | POA: Diagnosis not present

## 2020-08-10 DIAGNOSIS — I110 Hypertensive heart disease with heart failure: Secondary | ICD-10-CM | POA: Diagnosis not present

## 2020-08-10 DIAGNOSIS — E876 Hypokalemia: Secondary | ICD-10-CM | POA: Diagnosis not present

## 2020-08-10 DIAGNOSIS — I2510 Atherosclerotic heart disease of native coronary artery without angina pectoris: Secondary | ICD-10-CM | POA: Diagnosis not present

## 2020-08-10 DIAGNOSIS — M25551 Pain in right hip: Secondary | ICD-10-CM | POA: Insufficient documentation

## 2020-08-10 DIAGNOSIS — Z79899 Other long term (current) drug therapy: Secondary | ICD-10-CM | POA: Diagnosis not present

## 2020-08-10 DIAGNOSIS — S0990XA Unspecified injury of head, initial encounter: Secondary | ICD-10-CM | POA: Insufficient documentation

## 2020-08-10 DIAGNOSIS — I509 Heart failure, unspecified: Secondary | ICD-10-CM | POA: Diagnosis not present

## 2020-08-10 DIAGNOSIS — M25552 Pain in left hip: Secondary | ICD-10-CM

## 2020-08-10 LAB — CBC WITH DIFFERENTIAL/PLATELET
Abs Immature Granulocytes: 0.03 10*3/uL (ref 0.00–0.07)
Basophils Absolute: 0 10*3/uL (ref 0.0–0.1)
Basophils Relative: 1 %
Eosinophils Absolute: 0.1 10*3/uL (ref 0.0–0.5)
Eosinophils Relative: 2 %
HCT: 37.7 % (ref 36.0–46.0)
Hemoglobin: 12.3 g/dL (ref 12.0–15.0)
Immature Granulocytes: 0 %
Lymphocytes Relative: 18 %
Lymphs Abs: 1.3 10*3/uL (ref 0.7–4.0)
MCH: 28.6 pg (ref 26.0–34.0)
MCHC: 32.6 g/dL (ref 30.0–36.0)
MCV: 87.7 fL (ref 80.0–100.0)
Monocytes Absolute: 0.6 10*3/uL (ref 0.1–1.0)
Monocytes Relative: 9 %
Neutro Abs: 4.8 10*3/uL (ref 1.7–7.7)
Neutrophils Relative %: 70 %
Platelets: 362 10*3/uL (ref 150–400)
RBC: 4.3 MIL/uL (ref 3.87–5.11)
RDW: 13 % (ref 11.5–15.5)
WBC: 6.9 10*3/uL (ref 4.0–10.5)
nRBC: 0 % (ref 0.0–0.2)

## 2020-08-10 LAB — BASIC METABOLIC PANEL
Anion gap: 12 (ref 5–15)
BUN: 13 mg/dL (ref 8–23)
CO2: 27 mmol/L (ref 22–32)
Calcium: 8.8 mg/dL — ABNORMAL LOW (ref 8.9–10.3)
Chloride: 100 mmol/L (ref 98–111)
Creatinine, Ser: 0.81 mg/dL (ref 0.44–1.00)
GFR, Estimated: >60 mL/min (ref >60)
Glucose, Bld: 112 mg/dL — ABNORMAL HIGH (ref 70–99)
Potassium: 2.8 mmol/L — ABNORMAL LOW (ref 3.5–5.1)
Sodium: 139 mmol/L (ref 135–145)

## 2020-08-10 MED ORDER — POTASSIUM CHLORIDE CRYS ER 20 MEQ PO TBCR
20.0000 meq | EXTENDED_RELEASE_TABLET | Freq: Once | ORAL | Status: AC
  Administered 2020-08-10: 20 meq via ORAL
  Filled 2020-08-10: qty 1

## 2020-08-10 MED ORDER — ACETAMINOPHEN 325 MG PO TABS
650.0000 mg | ORAL_TABLET | Freq: Once | ORAL | Status: AC
  Administered 2020-08-10: 650 mg via ORAL
  Filled 2020-08-10: qty 2

## 2020-08-10 NOTE — ED Provider Notes (Signed)
[facility] EMERGENCY DEPARTMENT Provider Note   CSN: [id number] Arrival date & time: 08/10/20  0841     History Chief Complaint  Patient presents with  . Unwitnessed Fall    Claudia [name] is a 86 y.o. female.  Level 5 caveat secondary to baseline confusion.  She is a resident at [facility] and was found on the ground by staff.  Unknown time patient fell.  She was brought back to bed but was complaining of head pain and left hip pain so EMS was called.  EMS reports that she has frequent falls.  She has a MOLST form with her.  Patient endorses right parietal head pain and left hip pain.  The history is provided by the patient and the EMS personnel.  Fall This is a new problem. The current episode started 3 to 5 hours ago. The problem has not changed since onset.Associated symptoms include headaches. Pertinent negatives include no chest pain, no abdominal pain and no shortness of breath. The symptoms are aggravated by bending and twisting. Nothing relieves the symptoms. She has tried nothing for the symptoms. The treatment provided no relief.       Past Medical History:  Diagnosis Date  . Back pain   . Cataracts, both eyes   . CHF (congestive heart failure) (HCC)   . Chronic narcotic use   . Chronic pain   . Coronary artery disease   . Hyperlipidemia   . Hypertension   . Mitral valve prolapse   . Osteoarthritis   . Osteoporosis   . Pain management   . Reflux     Patient Active Problem List   Diagnosis Date Noted  . Pain   . Palliative care by specialist   . Goals of care, counseling/discussion   . DNR (do not resuscitate)   . DNI (do not intubate)   . Encounter for hospice care discussion   . Fall at home, initial encounter 07/28/2020  . Fall 07/28/2020  . Weakness   . Closed fracture of nasal bones   . Acute cystitis without hematuria   . Pain management contract signed 04/08/2020  . Protein-calorie malnutrition, severe (HCC)  03/11/2020  . Hyperlipidemia 12/20/2017  . Routine general medical examination at a health care facility 12/19/2016  . Insomnia 06/19/2015  . Narcotic dependence (HCC) 12/13/2013  . Chronic back pain 12/13/2013  . Hypertension     Past Surgical History:  Procedure Laterality Date  . ABDOMINAL HYSTERECTOMY    . APPENDECTOMY    . BLADDER SURGERY    . BREAST SURGERY    . SHOULDER SURGERY     Right     OB History   No obstetric history on file.     Family History  Problem Relation Age of Onset  . Hypertension Mother   . Diabetes Mother   . Heart disease Father   . Hypertension Father   . Diabetes Father     Social History   Tobacco Use  . Smoking status: Never Smoker  . Smokeless tobacco: Never Used  Vaping Use  . Vaping Use: Never used  Substance Use Topics  . Alcohol use: No    Alcohol/week: 0.0 standard drinks  . Drug use: No    Home Medications Prior to Admission medications   Medication Sig Start Date End Date Taking? Authorizing Provider  acetaminophen (TYLENOL) 500 MG tablet Take 1,000 mg by mouth every 6 (six) hours as needed for moderate pain.    [provider]  cloNIDine (CATAPRES) 0.1 MG tablet TAKE 1 TABLET DAILY FOR BLOOD PRESSURE>180 OR >100. Patient taking differently: Take 0.1 mg by mouth daily as needed. TAKE 1 TABLET DAILY IF BLOOD PRESSURE>180 OR >100. 07/05/18   Myrlene Brokerrawford, Elizabeth A, MD  diltiazem (CARDIZEM CD) 120 MG 24 hr capsule TAKE 2 CAPSULES DAILY. Patient taking differently: Take 120 mg by mouth in the morning and at bedtime.  05/03/20   Myrlene Brokerrawford, Elizabeth A, MD  docusate sodium 100 MG CAPS Take 100 mg by mouth daily. 12/15/13   Renford DillsPolite, Ronald, MD  lidocaine (LIDODERM) 5 % Place 2 patches onto the skin daily. Remove & Discard patch within 12 hours or as directed by MD 08/02/20   Lanae BoastKc, Ramesh, MD  Lifitegrast Benay Spice(XIIDRA) 5 % SOLN Place 1 drop into both eyes 2 (two) times daily.    [provider]  melatonin 3 MG TABS tablet  Take 1 tablet (3 mg total) by mouth at bedtime. 08/02/20   Lanae BoastKc, Ramesh, MD  mirtazapine (REMERON) 15 MG tablet Take 1 tablet (15 mg total) by mouth at bedtime. 08/02/20   Lanae BoastKc, Ramesh, MD  pantoprazole (PROTONIX) 40 MG tablet TAKE 1 TABLET EACH DAY. Patient taking differently: Take 40 mg by mouth daily.  05/03/20   Myrlene Brokerrawford, Elizabeth A, MD  polyethylene glycol powder (GLYCOLAX/MIRALAX) powder TAKE 17 GRAMS TWICE A DAY AS NEEDED Patient taking differently: Take 1 Container by mouth daily as needed for mild constipation.  09/04/17   Myrlene Brokerrawford, Elizabeth A, MD  simvastatin (ZOCOR) 20 MG tablet TAKE 1 TABLET IN THE P.M. Patient taking differently: Take 20 mg by mouth every evening.  05/03/20   Myrlene Brokerrawford, Elizabeth A, MD  triamterene-hydrochlorothiazide (MAXZIDE-25) 37.5-25 MG tablet TAKE 1 TABLET EACH DAY. Patient taking differently: Take 1 tablet by mouth daily.  05/03/20   Myrlene Brokerrawford, Elizabeth A, MD  XIIDRA 5 % SOLN Place 1 drop into both eyes in the morning and at bedtime. 08/02/20   Lanae BoastKc, Ramesh, MD    Allergies    Amitiza [lubiprostone], Benadryl [diphenhydramine], Ultram [tramadol], Aspirin, Ibuprofen, and Sulfa antibiotics  Review of Systems   Review of Systems  Constitutional: Negative for fever.  HENT: Negative for sore throat.   Eyes: Negative for visual disturbance.  Respiratory: Negative for shortness of breath.   Cardiovascular: Negative for chest pain.  Gastrointestinal: Negative for abdominal pain.  Genitourinary: Negative for dysuria.  Musculoskeletal: Negative for neck pain.  Skin: Negative for rash.  Neurological: Positive for headaches.    Physical Exam Updated Vital Signs BP (!) 170/77 (BP Location: Right Arm)   Pulse 89   Temp 99.2 F (37.3 C) (Oral)   Resp (!) 24   SpO2 98%   Physical Exam Vitals and nursing note reviewed.  Constitutional:      General: She is not in acute distress.    Appearance: Normal appearance. She is well-developed.  HENT:     Head:  Normocephalic and atraumatic.     Comments: Tender right parietal, no hematoma Eyes:     Conjunctiva/sclera: Conjunctivae normal.  Cardiovascular:     Rate and Rhythm: Normal rate and regular rhythm.     Heart sounds: No murmur heard.   Pulmonary:     Effort: Pulmonary effort is normal. No respiratory distress.     Breath sounds: Normal breath sounds.  Abdominal:     Palpations: Abdomen is soft.     Tenderness: There is no abdominal tenderness.  Musculoskeletal:        General: Tenderness  and signs of injury present.     Cervical back: Neck supple. No tenderness.     Comments: Right upper extremity full range of motion at wrist and elbow.  She has some pain with range of motion of shoulder and has some abrasions on her posterior scapula.  Left upper extremity full range of motion without any pain or limitations.  Full range of motion without any pain or limitations right lower extremity.  Left lower extremity nontender ankle and knee.  She has pain with range of motion at left hip.  No shortening or rotation.  Intact distal pulses.  Skin:    General: Skin is warm and dry.     Capillary Refill: Capillary refill takes less than 2 seconds.  Neurological:     General: No focal deficit present.     Mental Status: She is alert. Mental status is at baseline.     Sensory: No sensory deficit.     Motor: No weakness.     ED Results / Procedures / Treatments   Labs (all labs ordered are listed, but only abnormal results are displayed) Labs Reviewed  BASIC METABOLIC PANEL - Abnormal; Notable for the following components:      Result Value   Potassium 2.8 (*)    Glucose, Bld 112 (*)    Calcium 8.8 (*)    All other components within normal limits  CBC WITH DIFFERENTIAL/PLATELET    EKG None  Radiology DG Shoulder Right  Result Date: 08/10/2020 CLINICAL DATA:  Fall.  Pain. EXAM: RIGHT SHOULDER - 2+ VIEW COMPARISON:  Chest x-ray 07/28/2020. FINDINGS: Right shoulder replacement.  Hardware intact. Anatomic alignment. Postsurgical changes distal right clavicle. Prominent cystic changes noted about the glenoid most likely degenerative. No acute abnormality identified. No evidence of fracture. No evidence of dislocation. IMPRESSION: 1. Right shoulder replacement. Hardware intact. Anatomic alignment. Postsurgical changes distal right clavicle. 2. No acute abnormality identified. No evidence of fracture or dislocation. Electronically Signed   By: Maisie Fus  Register   On: 08/10/2020 09:48   CT Head Wo Contrast  Result Date: 08/10/2020 CLINICAL DATA:  Fall.  Hit posterior head.  Headache and neck pain. EXAM: CT HEAD WITHOUT CONTRAST CT CERVICAL SPINE WITHOUT CONTRAST TECHNIQUE: Multidetector CT imaging of the head and cervical spine was performed following the standard protocol without intravenous contrast. Multiplanar CT image reconstructions of the cervical spine were also generated. COMPARISON:  CT head and CT cervical spine from 07/28/2020. FINDINGS: CT HEAD FINDINGS Brain: No evidence of acute infarction, hemorrhage, hydrocephalus, extra-axial collection or mass lesion/mass effect. Similar patchy white matter hypoattenuation, compatible with chronic microvascular ischemic disease. Similar generalized cerebral atrophy with ex vacuo ventricular dilation. Vascular: Calcific atherosclerosis. Skull: No acute fracture. Sinuses/Orbits: Prior nasal bone fractures, similar in alignment. Mild ethmoid air cell mucosal thickening. No air-fluid levels. Other: No mastoid effusions. CT CERVICAL SPINE FINDINGS Alignment: Similar straightening of the normal cervical lordosis and approximately 2 mm of C7-T1 anterolisthesis. Skull base and vertebrae: No acute fracture. Soft tissues and spinal canal: No prevertebral fluid or swelling. No visible canal hematoma. Disc levels: Similar C3-C4 and C4-C5 osseous fusion. Similar multilevel degenerative disease with disc bulges, posterior disc osteophyte complexes,  uncovertebral and facet hypertrophy. No high-grade bony canal stenosis. Multilevel bony neural foraminal narrowing. Upper chest: No consolidation or visible pneumothorax. Other: Atherosclerotic vascular calcifications. IMPRESSION: CT head: 1. No evidence of acute intracranial abnormality. 2. Similar chronic microvascular ischemic disease and generalized atrophy. CT cervical spine: 1. No evidence of acute fracture  or traumatic malalignment. 2. Similar multilevel degenerative change, as described above. Electronically Signed   By: Feliberto Harts MD   On: 08/10/2020 10:13   CT Cervical Spine Wo Contrast  Result Date: 08/10/2020 CLINICAL DATA:  Fall.  Hit posterior head.  Headache and neck pain. EXAM: CT HEAD WITHOUT CONTRAST CT CERVICAL SPINE WITHOUT CONTRAST TECHNIQUE: Multidetector CT imaging of the head and cervical spine was performed following the standard protocol without intravenous contrast. Multiplanar CT image reconstructions of the cervical spine were also generated. COMPARISON:  CT head and CT cervical spine from 07/28/2020. FINDINGS: CT HEAD FINDINGS Brain: No evidence of acute infarction, hemorrhage, hydrocephalus, extra-axial collection or mass lesion/mass effect. Similar patchy white matter hypoattenuation, compatible with chronic microvascular ischemic disease. Similar generalized cerebral atrophy with ex vacuo ventricular dilation. Vascular: Calcific atherosclerosis. Skull: No acute fracture. Sinuses/Orbits: Prior nasal bone fractures, similar in alignment. Mild ethmoid air cell mucosal thickening. No air-fluid levels. Other: No mastoid effusions. CT CERVICAL SPINE FINDINGS Alignment: Similar straightening of the normal cervical lordosis and approximately 2 mm of C7-T1 anterolisthesis. Skull base and vertebrae: No acute fracture. Soft tissues and spinal canal: No prevertebral fluid or swelling. No visible canal hematoma. Disc levels: Similar C3-C4 and C4-C5 osseous fusion. Similar multilevel  degenerative disease with disc bulges, posterior disc osteophyte complexes, uncovertebral and facet hypertrophy. No high-grade bony canal stenosis. Multilevel bony neural foraminal narrowing. Upper chest: No consolidation or visible pneumothorax. Other: Atherosclerotic vascular calcifications. IMPRESSION: CT head: 1. No evidence of acute intracranial abnormality. 2. Similar chronic microvascular ischemic disease and generalized atrophy. CT cervical spine: 1. No evidence of acute fracture or traumatic malalignment. 2. Similar multilevel degenerative change, as described above. Electronically Signed   By: Feliberto Harts MD   On: 08/10/2020 10:13   DG Hip Unilat With Pelvis 2-3 Views Left  Result Date: 08/10/2020 CLINICAL DATA:  Fall.  Pain. EXAM: DG HIP (WITH OR WITHOUT PELVIS) 2-3V LEFT COMPARISON:  None. FINDINGS: There is no evidence of hip fracture or dislocation. Mild to moderate bilateral hip degenerative change. Lower lumbar degenerative change, partially imaged. Osteopenia. Calcific atherosclerosis. IMPRESSION: No evidence of acute fracture or dislocation. Electronically Signed   By: Feliberto Harts MD   On: 08/10/2020 09:51    Procedures Procedures (including critical care time)  Medications Ordered in ED Medications  potassium chloride SA (KLOR-CON) CR tablet 20 mEq (20 mEq Oral Given 08/10/20 1253)  acetaminophen (TYLENOL) tablet 650 mg (650 mg Oral Given 08/10/20 1253)    ED Course  I have reviewed the triage vital signs and the nursing notes.  Pertinent labs & imaging results that were available during my care of the patient were reviewed by me and considered in my medical decision making (see chart for details).  Clinical Course as of Aug 10 1740  Tue Aug 10, 2020  8016 X-rays of right shoulder and left hip interpreted by me as no acute fractures.  Awaiting radiology reading.   [MB]  1053 Discussed with the patient's daughter and legal guardian Claudia [name] and updated her on condition.   She said the patient was hoping to be discharged from rehab coming up in the next week but may be she needs more time.  Informed her that she likely will be transferred back to her facility unless something else comes up.  She is comfortable with plan.   [MB]    Clinical Course User Index [MB] Terrilee Files, MD   MDM Rules/Calculators/A&P  This patient complains of evaluation for injuries from a fall; this involves an extensive number of treatment Options and is a complaint that carries with it a high risk of complications and Morbidity. The differential includes skull fracture, head bleed, cervical spine fracture, shoulder fracture, hip fracture, contusion  I ordered, reviewed and interpreted labs, which included CBC with normal white count normal hemoglobin's, chemistries with low potassium mildly elevated glucose.   I ordered medication oral pain medicine and potassium supplementation I ordered imaging studies which included CT head and cervical spine, right shoulder and left hip x-rays and I independently    visualized and interpreted imaging which showed no acute traumatic findings Additional history obtained from EMS and patient's daughter Previous records obtained and reviewed in epic, was admitted about 10 days ago for frequent falls and weakness  After the interventions stated above, I reevaluated the patient and found patient to be minimally symptomatic.  She will be returned to her nursing facility where they can continue to observe her.   Final Clinical Impression(s) / ED Diagnoses Final diagnoses:  Fall, initial encounter  Acute pain of right shoulder  Left hip pain  Injury of head, initial encounter  Hypokalemia    Rx / DC Orders ED Discharge Orders    None       Terrilee Files, MD 08/10/20 1744

## 2020-08-10 NOTE — ED Notes (Signed)
Patient transported to X-ray 

## 2020-08-10 NOTE — ED Triage Notes (Signed)
Pt BIB [facility] from [facility] d/t a unwitnessed fall that occurred during the night & she was found in the floor around 0700. Pt is A/Ox3 disoriented to situation, denies memory of fall, she endorses 10/10 head pain. EMS reports staff at SNF reports she was originally c/o head neck &  Left hip pain.

## 2020-08-10 NOTE — Discharge Instructions (Signed)
You were seen in the emergency department for evaluation of injuries from a fall.  You had a CAT scan of your head and cervical spine along with x-rays of your right shoulder and left hip that did not show any acute abnormalities.  You had blood work done that showed your potassium was mildly low and you were given an oral supplement.  You will need to have your potassium level rechecked and you may need to be on medication for this.  Please follow-up with your regular doctor.  Return to the emergency department if any worsening or concerning symptoms

## 2020-08-10 NOTE — ED Notes (Signed)
[facility] called @ 1537-per Idalia Needle, RN called by Marylene Land

## 2020-08-13 ENCOUNTER — Other Ambulatory Visit: Payer: Self-pay

## 2020-08-13 ENCOUNTER — Non-Acute Institutional Stay: Payer: Medicare Other | Admitting: Hospice

## 2020-08-13 DIAGNOSIS — Z515 Encounter for palliative care: Secondary | ICD-10-CM

## 2020-08-13 DIAGNOSIS — S022XXD Fracture of nasal bones, subsequent encounter for fracture with routine healing: Secondary | ICD-10-CM

## 2020-08-13 NOTE — Progress Notes (Addendum)
Therapist, nutritional Palliative Care Consult Note Telephone: [phone]  Fax: [phone]  PATIENT NAME: Claudia [name] DOB: 1931-01-28 MRN: [id number]  PRIMARY CARE PROVIDER:   [name], MD Claudia Broker, MD [address] [place],  [place] [address]  REFERRING PROVIDER: [place] Blattenberger, NP  RESPONSIBLE PARTY: Self Emergency contact: Claudia [name] [phone] c [phone] h   RECOMMENDATIONS/PLAN:   Visit at the request of Claudia Blattenberger, NP for palliative consult.  Visit consisted of building trust and discussions on Palliative Medicine as specialized medical care for people living with serious illness, aimed at facilitating better quality of life through symptoms relief, assisting with advance care plan and establishing goals of care.  Patient endorsed palliative service and said she would like to be followed at home when she is discharged.  NP called Claudia [name] and left a voicemail with callback number. Claudia [name] called back and was updated on visit. She shared her stress and distress as a caregiver.  Visit consisted of counseling and education dealing with the complex and emotionally intense issues of symptom management and palliative care in the setting of serious and potentially life-threatening illness. Self care emphasized to Claudia [name]. Palliative care team will continue to support patient, patient's family, and medical team. Discussion on the difference between Palliative and Hospice care. Palliative care and hospice have similar goals of managing symptoms, promoting comfort, improving quality of life, and maintaining a person's dignity. However, palliative care may be offered during any phase of a serious illness, while hospice care is usually offered when a person is expected to live for 6 months or less.  Advance Care Planning: Our advance care planning conversation included a discussion about:    The value and importance of advance  care planning  Experiences with loved ones who have been seriously ill or have died  Exploration of personal, cultural or spiritual beliefs that might influence medical decisions  Exploration of goals of care in the event of a sudden injury or illness  Identification and preparation of a healthcare agent  Review and updating or creation of an  advance directive document  Advance Care Planning/Code Status: Patient affirmed she is a DO NOT RESUSCITATE.  Patient has a signed DNR form in facility record.  Goals of Care: Goals of care include to maximize quality of life and symptom management.  MOST selections include comfort measures, antibiotics if indicated, IV fluids if indicated, no feeding tube.  Patient has a signed MOST form in facility record. Follow up: Palliative care will continue to follow patient for goals of care clarification and symptom management.  Follow-up in 2 months/as needed  Functional status/Symptom management:  Fatigue/gait imbalance: Patient is currently working with physical therapy/Occupational Therapy for strengthening and balance.  Education provided to patient on slow position changes and the use of her rolling walker to help prevent fall.   Pain: Fentanyl patch and lidocaine patch in use.  Patient denied pain/discomfort during visit.  She is compliant with her medications nursing with no concerns at this time.   Appetite is fair to good. Continue with mirtazapine as ordered.  Palliative will continue to monitor for symptom management/decline and make recommendations as needed.  Family /Caregiver/Community Supports:  Patient is in SNF for rehab. Her daughter Claudia [name] is involved in her care.   I spent 1 hour and 35 minutes providing this consultation; time includes time spent with patient/family, chart review, provider coordination,  and documentation. More than 50% of the time in this  consultation was spent on coordinating communication  HISTORY OF PRESENT ILLNESS:   Claudia [name] is a 86 y.o. year old female with multiple medical problems including recent fracture of nasal bones, subsequent encounter for fracture with routine healing-in SNF for rehab,  history of fall, hypertension, osteoarthritis.  Palliative Care was asked to help address goals of care.   CODE STATUS: DO NOT RESUSCITATE  PPS: 50%  HOSPICE ELIGIBILITY/DIAGNOSIS: TBD  PAST MEDICAL HISTORY:  Past Medical History:  Diagnosis Date  . Back pain   . Cataracts, both eyes   . CHF (congestive heart failure) (HCC)   . Chronic narcotic use   . Chronic pain   . Coronary artery disease   . Hyperlipidemia   . Hypertension   . Mitral valve prolapse   . Osteoarthritis   . Osteoporosis   . Pain management   . Reflux     SOCIAL HX:  Social History   Tobacco Use  . Smoking status: Never Smoker  . Smokeless tobacco: Never Used  Substance Use Topics  . Alcohol use: No    Alcohol/week: 0.0 standard drinks    ALLERGIES:  Allergies  Allergen Reactions  . Amitiza [Lubiprostone] Nausea And Vomiting  . Benadryl [Diphenhydramine] Other (See Comments)    Makes me hyper  . Ultram [Tramadol]     Made sick to stomach  . Aspirin Other (See Comments)    Upsets stomach, and nervousness. "I feel like I'm going to scream."  . Ibuprofen Other (See Comments)    Jittery, nervousness, similar to aspirin  . Sulfa Antibiotics Nausea And Vomiting and Rash     PERTINENT MEDICATIONS:  Outpatient Encounter Medications as of 08/13/2020  Medication Sig  . acetaminophen (TYLENOL) 500 MG tablet Take 1,000 mg by mouth every 6 (six) hours as needed for moderate pain.  . cloNIDine (CATAPRES) 0.1 MG tablet TAKE 1 TABLET DAILY FOR BLOOD PRESSURE>180 OR >100. (Patient taking differently: Take 0.1 mg by mouth daily as needed. TAKE 1 TABLET DAILY IF BLOOD PRESSURE>180 OR >100.)  . diltiazem (CARDIZEM CD) 120 MG 24 hr capsule TAKE 2 CAPSULES DAILY. (Patient taking differently: Take 120 mg by mouth in the morning  and at bedtime. )  . docusate sodium 100 MG CAPS Take 100 mg by mouth daily.  Marland Kitchen lidocaine (LIDODERM) 5 % Place 2 patches onto the skin daily. Remove & Discard patch within 12 hours or as directed by MD  . Lifitegrast (XIIDRA) 5 % SOLN Place 1 drop into both eyes 2 (two) times daily.  . melatonin 3 MG TABS tablet Take 1 tablet (3 mg total) by mouth at bedtime.  . mirtazapine (REMERON) 15 MG tablet Take 1 tablet (15 mg total) by mouth at bedtime.  . pantoprazole (PROTONIX) 40 MG tablet TAKE 1 TABLET EACH DAY. (Patient taking differently: Take 40 mg by mouth daily. )  . polyethylene glycol powder (GLYCOLAX/MIRALAX) powder TAKE 17 GRAMS TWICE A DAY AS NEEDED (Patient taking differently: Take 1 Container by mouth daily as needed for mild constipation. )  . simvastatin (ZOCOR) 20 MG tablet TAKE 1 TABLET IN THE P.M. (Patient taking differently: Take 20 mg by mouth every evening. )  . triamterene-hydrochlorothiazide (MAXZIDE-25) 37.5-25 MG tablet TAKE 1 TABLET EACH DAY. (Patient taking differently: Take 1 tablet by mouth daily. )  . XIIDRA 5 % SOLN Place 1 drop into both eyes in the morning and at bedtime.   No facility-administered encounter medications on file as of 08/13/2020.    PHYSICAL EXAM/ROS:  General: NAD, cooperative Cardiovascular: regular rate and rhythm; denies chest pain Pulmonary: clear ant/post fields; normal respiratory effort Abdomen: soft, nontender, + bowel sounds; no report of constipation GU: no suprapubic tenderness Extremities: no edema Skin: no rashes to exposed skin Neurological: Weakness but otherwise nonfocal  Rosaura Carpenter, NP

## 2020-08-17 ENCOUNTER — Other Ambulatory Visit: Payer: Self-pay | Admitting: Internal Medicine

## 2020-08-17 NOTE — Telephone Encounter (Signed)
Done erx  [email or web address] reveiwed 

## 2020-08-19 ENCOUNTER — Telehealth: Payer: Self-pay

## 2020-08-19 NOTE — Telephone Encounter (Signed)
Spoke with Deirdra from [facility] whom called with medication concerns. I was able to ask Dr. Okey Dupre if it was OK for pt to continue taking medications and she has stated "She would like for pt to continue all meds."  *Medication concerns were for the a minor interaction between Diltiazem 120 MG 24 hr capsule and pts fentanyl patch.  **Second concern was for an interaction between the Diltiazem 120 MG 24 hr capsule and Simvastatin 20mg  tablet.

## 2020-08-20 ENCOUNTER — Encounter: Payer: Self-pay | Admitting: Internal Medicine

## 2020-08-20 ENCOUNTER — Other Ambulatory Visit: Payer: Self-pay

## 2020-08-20 ENCOUNTER — Ambulatory Visit (INDEPENDENT_AMBULATORY_CARE_PROVIDER_SITE_OTHER): Payer: Medicare Other | Admitting: Internal Medicine

## 2020-08-20 VITALS — BP 132/78 | HR 92 | Temp 99.1°F | Wt 106.8 lb

## 2020-08-20 DIAGNOSIS — F1120 Opioid dependence, uncomplicated: Secondary | ICD-10-CM | POA: Diagnosis not present

## 2020-08-20 DIAGNOSIS — M545 Low back pain, unspecified: Secondary | ICD-10-CM

## 2020-08-20 DIAGNOSIS — G8929 Other chronic pain: Secondary | ICD-10-CM | POA: Diagnosis not present

## 2020-08-20 MED ORDER — DILTIAZEM HCL ER COATED BEADS 120 MG PO CP24
120.0000 mg | ORAL_CAPSULE | Freq: Two times a day (BID) | ORAL | 3 refills | Status: DC
Start: 2020-08-20 — End: 2020-11-18

## 2020-08-20 MED ORDER — TRIAMTERENE-HCTZ 37.5-25 MG PO TABS
1.0000 | ORAL_TABLET | Freq: Every day | ORAL | 3 refills | Status: DC
Start: 2020-08-20 — End: 2020-11-25

## 2020-08-20 MED ORDER — MIRTAZAPINE 15 MG PO TABS: 15.0000 mg | ORAL_TABLET | Freq: Every day | ORAL | 3 refills | Status: AC

## 2020-08-20 MED ORDER — PANTOPRAZOLE SODIUM 40 MG PO TBEC: DELAYED_RELEASE_TABLET | ORAL | 3 refills | Status: AC

## 2020-08-20 MED ORDER — SIMVASTATIN 20 MG PO TABS
20.0000 mg | ORAL_TABLET | Freq: Every evening | ORAL | 3 refills | Status: DC
Start: 2020-08-20 — End: 2020-11-25

## 2020-08-20 NOTE — Assessment & Plan Note (Signed)
Review of [place] controlled database without inappropriate fills. Refill fentanyl recently and will refill when due. Next clinic visit 3 months.

## 2020-08-20 NOTE — Patient Instructions (Signed)
We have sent in all the medicines today.

## 2020-08-20 NOTE — Progress Notes (Signed)
° °  Subjective:   Patient ID: Claudia [name], female    DOB: 02-Feb-1931, 86 y.o.   MRN: [id number]  HPI The patient is an 86 YO female coming in for follow up opioid dependence (for back pain, recently several falls with worsening pain, hospital and rehab stay, discharged from rehab in the last week or two, takes fentanyl patch for pain which has managed her pain better than most other regimens over the years, denies sedation with it).   Review of Systems  Constitutional: Positive for activity change and appetite change.  HENT: Negative.   Eyes: Negative.   Respiratory: Negative for cough, chest tightness and shortness of breath.   Cardiovascular: Negative for chest pain, palpitations and leg swelling.  Gastrointestinal: Negative for abdominal distention, abdominal pain, constipation, diarrhea, nausea and vomiting.  Musculoskeletal: Positive for arthralgias, back pain, gait problem and myalgias.  Skin: Negative.   Psychiatric/Behavioral: Positive for sleep disturbance.    Objective:  Physical Exam Constitutional:      Appearance: She is well-developed.     Comments: frail  HENT:     Head: Normocephalic and atraumatic.  Cardiovascular:     Rate and Rhythm: Normal rate and regular rhythm.  Pulmonary:     Effort: Pulmonary effort is normal. No respiratory distress.     Breath sounds: Normal breath sounds. No wheezing or rales.  Abdominal:     General: Bowel sounds are normal. There is no distension.     Palpations: Abdomen is soft.     Tenderness: There is no abdominal tenderness. There is no rebound.  Musculoskeletal:        General: Tenderness present.     Cervical back: Normal range of motion.  Skin:    General: Skin is warm and dry.  Neurological:     Mental Status: She is alert and oriented to person, place, and time.     Coordination: Coordination abnormal.     Comments: Wheeled walker     Vitals:   08/20/20 1037  BP: 132/78  Pulse: 92  Temp: 99.1 F (37.3 C)    TempSrc: Oral  SpO2: 96%  Weight: 106 lb 12.8 oz (48.4 kg)    This visit occurred during the SARS-CoV-2 public health emergency.  Safety protocols were in place, including screening questions prior to the visit, additional usage of staff PPE, and extensive cleaning of exam room while observing appropriate contact time as indicated for disinfecting solutions.   Assessment & Plan:

## 2020-08-20 NOTE — Assessment & Plan Note (Signed)
Fentanyl 100 mcg #15 per month refilled recently and they do not need refill today. This does dramatically improve her QOL.

## 2020-08-24 ENCOUNTER — Telehealth: Payer: Self-pay

## 2020-08-24 NOTE — Telephone Encounter (Signed)
We have already spoken to them and advised to continue as prescribed.

## 2020-08-24 NOTE — Telephone Encounter (Signed)
Becky from [facility] called stated that pt has a level 1 drug reaction   Simvastatin and Diltiazem  At risk for increased Rhadomylysis.  Fax sent to [place] on this

## 2020-08-30 ENCOUNTER — Emergency Department (HOSPITAL_COMMUNITY)
Admission: EM | Admit: 2020-08-30 | Discharge: 2020-08-30 | Disposition: A | Discharge status: Home / Self Care | Payer: Medicare Other | Attending: Emergency Medicine | Admitting: Emergency Medicine

## 2020-08-30 ENCOUNTER — Telehealth: Payer: Self-pay | Admitting: Internal Medicine

## 2020-08-30 ENCOUNTER — Emergency Department (HOSPITAL_COMMUNITY): Payer: Medicare Other

## 2020-08-30 DIAGNOSIS — I110 Hypertensive heart disease with heart failure: Secondary | ICD-10-CM | POA: Insufficient documentation

## 2020-08-30 DIAGNOSIS — R42 Dizziness and giddiness: Secondary | ICD-10-CM | POA: Insufficient documentation

## 2020-08-30 DIAGNOSIS — R531 Weakness: Secondary | ICD-10-CM

## 2020-08-30 DIAGNOSIS — R5381 Other malaise: Secondary | ICD-10-CM

## 2020-08-30 DIAGNOSIS — I2510 Atherosclerotic heart disease of native coronary artery without angina pectoris: Secondary | ICD-10-CM | POA: Insufficient documentation

## 2020-08-30 DIAGNOSIS — Z79899 Other long term (current) drug therapy: Secondary | ICD-10-CM | POA: Insufficient documentation

## 2020-08-30 DIAGNOSIS — I509 Heart failure, unspecified: Secondary | ICD-10-CM | POA: Diagnosis not present

## 2020-08-30 LAB — CBC
HCT: 36.1 % (ref 36.0–46.0)
Hemoglobin: 12.2 g/dL (ref 12.0–15.0)
MCH: 29.3 pg (ref 26.0–34.0)
MCHC: 33.8 g/dL (ref 30.0–36.0)
MCV: 86.8 fL (ref 80.0–100.0)
Platelets: 266 10*3/uL (ref 150–400)
RBC: 4.16 MIL/uL (ref 3.87–5.11)
RDW: 13.4 % (ref 11.5–15.5)
WBC: 9.1 10*3/uL (ref 4.0–10.5)
nRBC: 0 % (ref 0.0–0.2)

## 2020-08-30 LAB — URINALYSIS, ROUTINE W REFLEX MICROSCOPIC
Bilirubin Urine: NEGATIVE
Glucose, UA: NEGATIVE mg/dL
Hgb urine dipstick: NEGATIVE
Ketones, ur: 5 mg/dL — AB
Leukocytes,Ua: NEGATIVE
Nitrite: NEGATIVE
Protein, ur: NEGATIVE mg/dL
Specific Gravity, Urine: 1.014 (ref 1.005–1.030)
pH: 6 (ref 5.0–8.0)

## 2020-08-30 LAB — BASIC METABOLIC PANEL
Anion gap: 10 (ref 5–15)
BUN: 18 mg/dL (ref 8–23)
CO2: 30 mmol/L (ref 22–32)
Calcium: 9 mg/dL (ref 8.9–10.3)
Chloride: 101 mmol/L (ref 98–111)
Creatinine, Ser: 0.82 mg/dL (ref 0.44–1.00)
GFR, Estimated: >60 mL/min (ref >60)
Glucose, Bld: 95 mg/dL (ref 70–99)
Potassium: 3.4 mmol/L — ABNORMAL LOW (ref 3.5–5.1)
Sodium: 141 mmol/L (ref 135–145)

## 2020-08-30 MED ORDER — POTASSIUM CHLORIDE CRYS ER 20 MEQ PO TBCR
20.0000 meq | EXTENDED_RELEASE_TABLET | Freq: Once | ORAL | Status: AC
  Administered 2020-08-30: 20 meq via ORAL
  Filled 2020-08-30: qty 1

## 2020-08-30 NOTE — ED Provider Notes (Signed)
[facility]-EMERGENCY DEPT Provider Note   CSN: [id number] Arrival date & time: 08/30/20  6761     History Chief Complaint  Patient presents with  . Dizziness    Claudia [name] is a 87 y.o. female.  Patient presents indicating in past week feels that her speech is 'off/not right', at times slurred and/or trouble finding words, and also indicates has felt mildly dizzy, unsteady when up, and has worsening problems walking. Symptoms gradual onset in past week without abrupt change today. States the trouble walking/falling is not new, but worse in past couple weeks. Denies new fall today, but does indicate has fallen since return to home from rehab stay about a week ago. No loc with that fall, but does say she chipped her frontal tooth. Hx chronic back pain - denies new or worsening neck or back pain. No new numbness or focal weakness. No severe headaches. No chest pain or sob. No abd pain or nv. No fever or chills.   The history is provided by the patient and the EMS personnel.  Dizziness Associated symptoms: no chest pain, no headaches, no nausea, no shortness of breath and no vomiting        Past Medical History:  Diagnosis Date  . Back pain   . Cataracts, both eyes   . CHF (congestive heart failure) (HCC)   . Chronic narcotic use   . Chronic pain   . Coronary artery disease   . Hyperlipidemia   . Hypertension   . Mitral valve prolapse   . Osteoarthritis   . Osteoporosis   . Pain management   . Reflux     Patient Active Problem List   Diagnosis Date Noted  . Pain   . Palliative care by specialist   . Goals of care, counseling/discussion   . DNR (do not resuscitate)   . DNI (do not intubate)   . Encounter for hospice care discussion   . Fall at home, initial encounter 07/28/2020  . Fall 07/28/2020  . Weakness   . Closed fracture of nasal bones   . Acute cystitis without hematuria   . Pain management contract signed 04/08/2020  .  Protein-calorie malnutrition, severe (HCC) 03/11/2020  . Hyperlipidemia 12/20/2017  . Routine general medical examination at a health care facility 12/19/2016  . Insomnia 06/19/2015  . Narcotic dependence (HCC) 12/13/2013  . Chronic back pain 12/13/2013  . Hypertension     Past Surgical History:  Procedure Laterality Date  . ABDOMINAL HYSTERECTOMY    . APPENDECTOMY    . BLADDER SURGERY    . BREAST SURGERY    . SHOULDER SURGERY     Right     OB History   No obstetric history on file.     Family History  Problem Relation Age of Onset  . Hypertension Mother   . Diabetes Mother   . Heart disease Father   . Hypertension Father   . Diabetes Father     Social History   Tobacco Use  . Smoking status: Never Smoker  . Smokeless tobacco: Never Used  Vaping Use  . Vaping Use: Never used  Substance Use Topics  . Alcohol use: No    Alcohol/week: 0.0 standard drinks  . Drug use: No    Home Medications Prior to Admission medications   Medication Sig Start Date End Date Taking? Authorizing Provider  acetaminophen (TYLENOL) 500 MG tablet Take 1,000 mg by mouth every 6 (six) hours as needed for moderate pain.  [provider]  diltiazem (CARDIZEM CD) 120 MG 24 hr capsule Take 1 capsule (120 mg total) by mouth in the morning and at bedtime. 08/20/20   [name], MD  docusate sodium 100 MG CAPS Take 100 mg by mouth daily. 12/15/13   Renford Dills, MD  fentaNYL (DURAGESIC) 100 MCG/HR APPLY 1 PATCH EVERY OTHER DAY AS DIRECTED 08/17/20   Corwin Levins, MD  [employer] Benay Spice) 5 % SOLN Place 1 drop into both eyes 2 (two) times daily.    [provider]  melatonin 3 MG TABS tablet Take 1 tablet (3 mg total) by mouth at bedtime. 08/02/20   Lanae Boast, MD  mirtazapine (REMERON) 15 MG tablet Take 1 tablet (15 mg total) by mouth at bedtime. 08/20/20   [name], MD  pantoprazole (PROTONIX) 40 MG tablet TAKE 1 TABLET EACH DAY. 08/20/20   [name], MD  polyethylene glycol powder (GLYCOLAX/MIRALAX) powder TAKE 17 GRAMS TWICE A DAY AS NEEDED Patient taking differently: Take 3,350 g by mouth daily as needed for mild constipation.  09/04/17   [name], MD  simvastatin (ZOCOR) 20 MG tablet Take 1 tablet (20 mg total) by mouth every evening. 08/20/20   [name], MD  triamterene-hydrochlorothiazide (MAXZIDE-25) 37.5-25 MG tablet Take 1 tablet by mouth daily. 08/20/20   [name], MD  XIIDRA 5 % SOLN Place 1 drop into both eyes in the morning and at bedtime. 08/02/20   Lanae Boast, MD    Allergies    Amitiza [lubiprostone], Benadryl [diphenhydramine], Ultram [tramadol], Aspirin, Ibuprofen, and Sulfa antibiotics  Review of Systems   Review of Systems  Constitutional: Negative for chills and fever.  HENT: Negative for sore throat and trouble swallowing.   Eyes: Negative for visual disturbance.  Respiratory: Negative for cough and shortness of breath.   Cardiovascular: Negative for chest pain.  Gastrointestinal: Negative for abdominal pain, nausea and vomiting.  Genitourinary: Negative for dysuria and flank pain.  Musculoskeletal: Negative for back pain and neck pain.  Skin: Negative for rash.  Neurological: Positive for dizziness and speech difficulty. Negative for syncope and headaches.  Hematological: Does not bruise/bleed easily.  Psychiatric/Behavioral: Negative for confusion.    Physical Exam Updated Vital Signs BP 130/60 (BP Location: Right Arm)   Pulse 78   Temp 98.3 F (36.8 C)   Resp 18   SpO2 97%   Physical Exam Vitals and nursing note reviewed.  Constitutional:      Appearance: Normal appearance. She is well-developed.  HENT:     Head: Atraumatic.     Nose: Nose normal.     Mouth/Throat:     Mouth: Mucous membranes are moist.  Eyes:     General: No scleral icterus.    Conjunctiva/sclera: Conjunctivae normal.     Pupils: Pupils are equal, round, and reactive to  light.  Neck:     Vascular: No carotid bruit.     Trachea: No tracheal deviation.  Cardiovascular:     Rate and Rhythm: Normal rate and regular rhythm.     Pulses: Normal pulses.     Heart sounds: Normal heart sounds. No murmur heard.  No friction rub. No gallop.   Pulmonary:     Effort: Pulmonary effort is normal. No respiratory distress.     Breath sounds: Normal breath sounds.  Chest:     Chest wall: No tenderness.  Abdominal:     General: Bowel sounds are normal. There is no distension.  Palpations: Abdomen is soft.     Tenderness: There is no abdominal tenderness. There is no guarding.  Genitourinary:    Comments: No cva tenderness.  Musculoskeletal:        General: No swelling.     Cervical back: Normal range of motion and neck supple. No rigidity. No muscular tenderness.     Comments: CTLS spine, non tender, aligned, no step off. Good rom bil ext without pain or focal bony tenderness.   Skin:    General: Skin is warm and dry.     Findings: No rash.  Neurological:     Mental Status: She is alert.     Comments: Alert, speech without gross dysarthria. Occasionally does have trouble finding words to express herself but in general is able to communicate effectively. Motor intact bil, stre 5/5. Sens grossly intact.   Psychiatric:        Mood and Affect: Mood normal.     ED Results / Procedures / Treatments   Labs (all labs ordered are listed, but only abnormal results are displayed) Results for orders placed or performed during the hospital encounter of 08/30/20  CBC  Result Value Ref Range   WBC 9.1 4.0 - 10.5 K/uL   RBC 4.16 3.87 - 5.11 MIL/uL   Hemoglobin 12.2 12.0 - 15.0 g/dL   HCT 22.0 36 - 46 %   MCV 86.8 80.0 - 100.0 fL   MCH 29.3 26.0 - 34.0 pg   MCHC 33.8 30.0 - 36.0 g/dL   RDW 25.4 27.0 - 62.3 %   Platelets 266 150 - 400 K/uL   nRBC 0.0 0.0 - 0.2 %  Basic metabolic panel  Result Value Ref Range   Sodium 141 135 - 145 mmol/L   Potassium 3.4 (L) 3.5  - 5.1 mmol/L   Chloride 101 98 - 111 mmol/L   CO2 30 22 - 32 mmol/L   Glucose, Bld 95 70 - 99 mg/dL   BUN 18 8 - 23 mg/dL   Creatinine, Ser 7.62 0.44 - 1.00 mg/dL   Calcium 9.0 8.9 - 83.1 mg/dL   GFR, Estimated >51 >76 mL/min   Anion gap 10 5 - 15  Urinalysis, Routine w reflex microscopic  Result Value Ref Range   Color, Urine YELLOW YELLOW   APPearance CLEAR CLEAR   Specific Gravity, Urine 1.014 1.005 - 1.030   pH 6.0 5.0 - 8.0   Glucose, UA NEGATIVE NEGATIVE mg/dL   Hgb urine dipstick NEGATIVE NEGATIVE   Bilirubin Urine NEGATIVE NEGATIVE   Ketones, ur 5 (A) NEGATIVE mg/dL   Protein, ur NEGATIVE NEGATIVE mg/dL   Nitrite NEGATIVE NEGATIVE   Leukocytes,Ua NEGATIVE NEGATIVE   DG Shoulder Right  Result Date: 08/10/2020 CLINICAL DATA:  Fall.  Pain. EXAM: RIGHT SHOULDER - 2+ VIEW COMPARISON:  Chest x-ray 07/28/2020. FINDINGS: Right shoulder replacement. Hardware intact. Anatomic alignment. Postsurgical changes distal right clavicle. Prominent cystic changes noted about the glenoid most likely degenerative. No acute abnormality identified. No evidence of fracture. No evidence of dislocation. IMPRESSION: 1. Right shoulder replacement. Hardware intact. Anatomic alignment. Postsurgical changes distal right clavicle. 2. No acute abnormality identified. No evidence of fracture or dislocation. Electronically Signed   By: Maisie Fus  Register   On: 08/10/2020 09:48   CT Head Wo Contrast  Result Date: 08/10/2020 CLINICAL DATA:  Fall.  Hit posterior head.  Headache and neck pain. EXAM: CT HEAD WITHOUT CONTRAST CT CERVICAL SPINE WITHOUT CONTRAST TECHNIQUE: Multidetector CT imaging of the head and cervical spine  was performed following the standard protocol without intravenous contrast. Multiplanar CT image reconstructions of the cervical spine were also generated. COMPARISON:  CT head and CT cervical spine from 07/28/2020. FINDINGS: CT HEAD FINDINGS Brain: No evidence of acute infarction, hemorrhage,  hydrocephalus, extra-axial collection or mass lesion/mass effect. Similar patchy white matter hypoattenuation, compatible with chronic microvascular ischemic disease. Similar generalized cerebral atrophy with ex vacuo ventricular dilation. Vascular: Calcific atherosclerosis. Skull: No acute fracture. Sinuses/Orbits: Prior nasal bone fractures, similar in alignment. Mild ethmoid air cell mucosal thickening. No air-fluid levels. Other: No mastoid effusions. CT CERVICAL SPINE FINDINGS Alignment: Similar straightening of the normal cervical lordosis and approximately 2 mm of C7-T1 anterolisthesis. Skull base and vertebrae: No acute fracture. Soft tissues and spinal canal: No prevertebral fluid or swelling. No visible canal hematoma. Disc levels: Similar C3-C4 and C4-C5 osseous fusion. Similar multilevel degenerative disease with disc bulges, posterior disc osteophyte complexes, uncovertebral and facet hypertrophy. No high-grade bony canal stenosis. Multilevel bony neural foraminal narrowing. Upper chest: No consolidation or visible pneumothorax. Other: Atherosclerotic vascular calcifications. IMPRESSION: CT head: 1. No evidence of acute intracranial abnormality. 2. Similar chronic microvascular ischemic disease and generalized atrophy. CT cervical spine: 1. No evidence of acute fracture or traumatic malalignment. 2. Similar multilevel degenerative change, as described above. Electronically Signed   By: Feliberto HartsFrederick S Jones MD   On: 08/10/2020 10:13   CT Cervical Spine Wo Contrast  Result Date: 08/10/2020 CLINICAL DATA:  Fall.  Hit posterior head.  Headache and neck pain. EXAM: CT HEAD WITHOUT CONTRAST CT CERVICAL SPINE WITHOUT CONTRAST TECHNIQUE: Multidetector CT imaging of the head and cervical spine was performed following the standard protocol without intravenous contrast. Multiplanar CT image reconstructions of the cervical spine were also generated. COMPARISON:  CT head and CT cervical spine from 07/28/2020.  FINDINGS: CT HEAD FINDINGS Brain: No evidence of acute infarction, hemorrhage, hydrocephalus, extra-axial collection or mass lesion/mass effect. Similar patchy white matter hypoattenuation, compatible with chronic microvascular ischemic disease. Similar generalized cerebral atrophy with ex vacuo ventricular dilation. Vascular: Calcific atherosclerosis. Skull: No acute fracture. Sinuses/Orbits: Prior nasal bone fractures, similar in alignment. Mild ethmoid air cell mucosal thickening. No air-fluid levels. Other: No mastoid effusions. CT CERVICAL SPINE FINDINGS Alignment: Similar straightening of the normal cervical lordosis and approximately 2 mm of C7-T1 anterolisthesis. Skull base and vertebrae: No acute fracture. Soft tissues and spinal canal: No prevertebral fluid or swelling. No visible canal hematoma. Disc levels: Similar C3-C4 and C4-C5 osseous fusion. Similar multilevel degenerative disease with disc bulges, posterior disc osteophyte complexes, uncovertebral and facet hypertrophy. No high-grade bony canal stenosis. Multilevel bony neural foraminal narrowing. Upper chest: No consolidation or visible pneumothorax. Other: Atherosclerotic vascular calcifications. IMPRESSION: CT head: 1. No evidence of acute intracranial abnormality. 2. Similar chronic microvascular ischemic disease and generalized atrophy. CT cervical spine: 1. No evidence of acute fracture or traumatic malalignment. 2. Similar multilevel degenerative change, as described above. Electronically Signed   By: Feliberto HartsFrederick S Jones MD   On: 08/10/2020 10:13   MR BRAIN WO CONTRAST  Result Date: 08/30/2020 CLINICAL DATA:  Neuro deficit, acute, stroke suspected; dizziness, nonspecific. Additional history provided by technologist: Patient reports dizziness, difficulty ambulating. EXAM: MRI HEAD WITHOUT CONTRAST TECHNIQUE: Multiplanar, multiecho pulse sequences of the brain and surrounding structures were obtained without intravenous contrast.  COMPARISON:  CT head/cervical spine 08/10/2020. FINDINGS: Brain: Moderate generalized cerebral atrophy. Moderate multifocal T2/FLAIR hyperintensity within the cerebral white matter is nonspecific, but compatible with chronic small vessel ischemic disease. Tiny chronic infarct within the  inferior left cerebellar hemisphere (series 8, image 6). There is no acute infarct. No evidence of intracranial mass. No chronic intracranial blood products. No extra-axial fluid collection. No midline shift. Vascular: Expected proximal arterial flow voids. Skull and upper cervical spine: No focal marrow lesion. Cervical spondylosis. Redemonstrated C3-C5 vertebral fusion. Sinuses/Orbits: Visualized orbits show no acute finding. Mild ethmoid sinus mucosal thickening. Other: Trace fluid within the left mastoid air cells. IMPRESSION: No evidence of acute intracranial abnormality, including acute infarction. Moderate cerebral atrophy and chronic small vessel ischemic disease. Tiny chronic left cerebellar infarct. Electronically Signed   By: Jackey Loge DO   On: 08/30/2020 11:41   DG Hip Unilat With Pelvis 2-3 Views Left  Result Date: 08/10/2020 CLINICAL DATA:  Fall.  Pain. EXAM: DG HIP (WITH OR WITHOUT PELVIS) 2-3V LEFT COMPARISON:  None. FINDINGS: There is no evidence of hip fracture or dislocation. Mild to moderate bilateral hip degenerative change. Lower lumbar degenerative change, partially imaged. Osteopenia. Calcific atherosclerosis. IMPRESSION: No evidence of acute fracture or dislocation. Electronically Signed   By: Feliberto Harts MD   On: 08/10/2020 09:51    EKG None  Radiology No results found.  Procedures Procedures (including critical care time)  Medications Ordered in ED Medications - No data to display  ED Course  I have reviewed the triage vital signs and the nursing notes.  Pertinent labs & imaging results that were available during my care of the patient were reviewed by me and considered in my  medical decision making (see chart for details).    MDM Rules/Calculators/A&P                         Iv ns. Labs. Imaging.  Reviewed nursing notes and prior charts for additional history.  Recent d/c summary reviewed, fall with deconditioning/general weakness then, and d/c to rehab stay. Recent frequent falls noted.   Labs reviewed/interpreted by me - chem normal except k slightly low. kcl po.   MRI reviewed/interpreted by me - no acute cva.  Po fluids, food.  Recheck pt content, alert, no faintness, no dizziness.   TOC team consulted re maximizing home health services, including HH SW as may need eventual long term ECF (pt currently indicating does not want long term/permanent ECF).  Also rec close pcp f/u.   Return precautions provided.      Final Clinical Impression(s) / ED Diagnoses Final diagnoses:  None    Rx / DC Orders ED Discharge Orders    None       Cathren Laine, MD 08/30/20 1428

## 2020-08-30 NOTE — ED Notes (Signed)
Pt to MRI via stretcher.

## 2020-08-30 NOTE — ED Notes (Signed)
Pt ambulated with walker assistance; Publishing rights manager were at stand by. Pt was able to ambulate from her room to room 20. Pt stated " my head feel dizzy". Pt was steady with walker.

## 2020-08-30 NOTE — ED Notes (Signed)
Called and spoke with pts daughter [name] [phone]. Advised pt was being discharged and she advised she would come and get her.

## 2020-08-30 NOTE — Discharge Instructions (Addendum)
It was our pleasure to provide your ER care today - we hope that you feel better.  Use walker, and great care/caution to help minimize risk of falling.   We have asked our social work team to help maximize your home health services, including possible home health social worker - should you wish to pursue long term extended care facility placement, discuss with your doctor and home health services/social worker.   Follow up with your doctor in the next 1-2 weeks.  Return to ER if worse, new symptoms, fevers, new or severe pain, trouble breathing, or other emergency concern.

## 2020-08-30 NOTE — ED Notes (Signed)
Bed alarm has been placed and activated for pt's safety. °

## 2020-08-30 NOTE — Telephone Encounter (Signed)
Patients daughter calling to make [place] aware that she has calling EMS for the patient because the patient has been having dizziness, hasn't ate since Saturday, and is refusing to get out of the bed. [name]# [phone]

## 2020-08-30 NOTE — ED Triage Notes (Signed)
Presents via EMS with c/o dizziness and unsteady gait for about 1 month. Pt states " I just cant walk, I fall".

## 2020-09-06 ENCOUNTER — Other Ambulatory Visit: Payer: Self-pay

## 2020-09-06 ENCOUNTER — Ambulatory Visit (INDEPENDENT_AMBULATORY_CARE_PROVIDER_SITE_OTHER): Payer: Medicare Other | Admitting: Internal Medicine

## 2020-09-06 ENCOUNTER — Encounter: Payer: Self-pay | Admitting: Internal Medicine

## 2020-09-06 VITALS — BP 150/82 | HR 109 | Temp 98.2°F | Ht 59.0 in | Wt 101.4 lb

## 2020-09-06 DIAGNOSIS — G8929 Other chronic pain: Secondary | ICD-10-CM

## 2020-09-06 DIAGNOSIS — N3000 Acute cystitis without hematuria: Secondary | ICD-10-CM | POA: Diagnosis not present

## 2020-09-06 DIAGNOSIS — M545 Low back pain, unspecified: Secondary | ICD-10-CM

## 2020-09-06 DIAGNOSIS — R531 Weakness: Secondary | ICD-10-CM

## 2020-09-06 NOTE — Patient Instructions (Signed)
We will work with you to get you in the rehab facility.

## 2020-09-06 NOTE — Progress Notes (Signed)
   Subjective:   Patient ID: Claudia [name], female    DOB: July 16, 1931, 86 y.o.   MRN: [id number]  HPI  The patient is an 86 YO female coming in for concerns about urinary retention. She is not having much appetite lately. Not urinating as much as usual. Recent ER visit with antibiotic for infection and seems she has not improved. Overall she is declining functionally. She is falling a lot at home and this has impacted her mobility. The change in mobility is making her more likely to fall. She lives with granddaughter and her daughter is now caring for her and granddaughter (special needs). She is not able to care adequately for them and is requesting assistance for placement for both of them. Denies fevers or chills. Denies abdominal pain. Is eating and drinking but not as much as usual.   PMH, [place], social history reviewed and updated.   Review of Systems  Constitutional: Positive for activity change, appetite change, fatigue and unexpected weight change.  HENT: Negative.   Eyes: Negative.   Respiratory: Negative for cough, chest tightness and shortness of breath.   Cardiovascular: Negative for chest pain, palpitations and leg swelling.  Gastrointestinal: Negative for abdominal distention, abdominal pain, constipation, diarrhea, nausea and vomiting.  Genitourinary: Positive for difficulty urinating.  Musculoskeletal: Positive for arthralgias, back pain and gait problem.  Skin: Negative.   Neurological: Positive for weakness.  Psychiatric/Behavioral: Negative.     Objective:  Physical Exam Constitutional:      Appearance: She is well-developed.     Comments: Chronically ill appearing  HENT:     Head: Normocephalic and atraumatic.  Cardiovascular:     Rate and Rhythm: Normal rate and regular rhythm.  Pulmonary:     Effort: Pulmonary effort is normal. No respiratory distress.     Breath sounds: Normal breath sounds. No wheezing or rales.  Abdominal:     General: Bowel sounds are  normal. There is no distension.     Palpations: Abdomen is soft.     Tenderness: There is no abdominal tenderness. There is no rebound.  Musculoskeletal:        General: Tenderness present.     Cervical back: Normal range of motion.  Skin:    General: Skin is warm and dry.  Neurological:     Mental Status: She is alert and oriented to person, place, and time.     Coordination: Coordination abnormal.     Comments: Walker/wheelchair for ambulation     Vitals:   09/06/20 1511  BP: (!) 150/82  Pulse: (!) 109  Temp: 98.2 F (36.8 C)  TempSrc: Oral  SpO2: 99%  Weight: 101 lb 6.4 oz (46 kg)  Height: 4\' 11"  (1.499 m)    This visit occurred during the SARS-CoV-2 public health emergency.  Safety protocols were in place, including screening questions prior to the visit, additional usage of staff PPE, and extensive cleaning of exam room while observing appropriate contact time as indicated for disinfecting solutions.   Assessment & Plan:

## 2020-09-07 ENCOUNTER — Other Ambulatory Visit (INDEPENDENT_AMBULATORY_CARE_PROVIDER_SITE_OTHER): Payer: Medicare Other

## 2020-09-07 ENCOUNTER — Telehealth: Payer: Self-pay | Admitting: *Deleted

## 2020-09-07 ENCOUNTER — Other Ambulatory Visit: Payer: Self-pay | Admitting: *Deleted

## 2020-09-07 ENCOUNTER — Other Ambulatory Visit: Payer: Self-pay | Admitting: Internal Medicine

## 2020-09-07 DIAGNOSIS — R339 Retention of urine, unspecified: Secondary | ICD-10-CM

## 2020-09-07 LAB — URINALYSIS, ROUTINE W REFLEX MICROSCOPIC
Bilirubin Urine: NEGATIVE
Ketones, ur: NEGATIVE
Nitrite: NEGATIVE
Specific Gravity, Urine: 1.01 (ref 1.000–1.030)
Total Protein, Urine: NEGATIVE
Urine Glucose: NEGATIVE
Urobilinogen, UA: 0.2 (ref 0.0–1.0)
pH: 6 (ref 5.0–8.0)

## 2020-09-07 MED ORDER — CEPHALEXIN 500 MG PO CAPS: 500.0000 mg | ORAL_CAPSULE | Freq: Two times a day (BID) | ORAL | 0 refills | Status: AC

## 2020-09-07 NOTE — Telephone Encounter (Signed)
Patient dropped a urine specimen off at [facility] lab. Orders placed per PCP.

## 2020-09-07 NOTE — Patient Outreach (Signed)
[facility]) Care Management  09/07/2020  CJ BEECHER [birth year] [id number]   Referral Date: 11/30 Referral Source: MD office Referral Reason: Need SNF placement Insurance: [facility]   Outreach attempt #1, unsuccessful to member's listed number.  Call then placed to daughter's number, [name], initially unsuccessful but she returned call.  Identity verified.  This care manager introduced self and stated purpose of call.  [facility] care management services explained.    Social: Per daughter, member lives with her daughter, Regis Bill (mentally handicapped and non-verbal), for whom she was the caregiver for until recently.  Per [name], she has been helping to care for the both of them daily but is now seeking more support and placement as she is unable to continue 24 hour care for both of them.  She report her mother was recently diagnosed with a stroke, was discharged to rehab prior to coming back home.  Since being back home, she has fallen a few times, still struggling to regain strength.  Decision was made that it would best to have her placed.  Conditions: Per chart, his history of HTN, stroke, HLD, and chronic back pain.  Medications: Reviewed with daughter, denies any need for financial assistance.  Currently placed on antibiotic for UTI.  Appointments: Was seen by PCP yesterday, will have follow up in 3 months.  [name] provides transportation to all appointments.  Advance Directives: [name] report member is DNR and she is listed as POA.   Plan: RN CM will place referral to CSW, will follow up with daughter within the next 2 weeks.  Kemper Durie, [place], MSN [facility] Care Management  [facility] Manager [phone]

## 2020-09-08 ENCOUNTER — Encounter: Payer: Self-pay | Admitting: Internal Medicine

## 2020-09-08 NOTE — Assessment & Plan Note (Signed)
U/A and culture ordered, unable to void at visit and given collection kit to return. Treat as appropriate.

## 2020-09-08 NOTE — Assessment & Plan Note (Signed)
Likely needs placement due to recurrent falls and inability to help care for herself at this time. Referral to [facility] for assistance with her and her granddaughter done today.

## 2020-09-08 NOTE — Assessment & Plan Note (Signed)
Using her fentanyl patch still and this helps some with pain. This has been best control previously with her chronic pain.

## 2020-09-09 ENCOUNTER — Encounter: Payer: Self-pay | Admitting: *Deleted

## 2020-09-09 ENCOUNTER — Other Ambulatory Visit: Payer: Self-pay | Admitting: *Deleted

## 2020-09-09 LAB — URINE CULTURE
MICRO NUMBER:: 11257479
SPECIMEN QUALITY:: ADEQUATE

## 2020-09-09 NOTE — Patient Outreach (Signed)
[facility]) Care Management  09/09/2020  Claudia [name] Martha 09-15-1931 [id number]  CSW was able to make initial contact with patient's daughter, Camille Bal today, to perform the phone assessment on patient, as well as assess and assist with social work needs and services.  CSW introduced self, explained role and types of services provided through [employer] Care Management [facility] Care Management).  CSW further explained to Ms. Mayer Camel that CSW works with patient's RNCM, also with [facility] Care Management, Kemper Durie.  CSW then explained the reason for the call, indicating that Mrs. Maurice March thought that patient would benefit from social work services and resources to assist with arranging long-term care placement into a skilled nursing facility.  CSW obtained two HIPAA compliant identifiers from Ms. Mayer Camel, which included patient's name and date of birth.  Ms. Mayer Camel started off the conversation by reporting that she is patient's Healthcare Power of Gerrit Friends and the legal guardian for her 67 year old mentally disabled, handicapped, non-verbal adopted sister, Claudia [name], with whom patient currently resides.    According to Ms. Mayer Camel, she has been providing 24 care and supervision, to both patient and Regis Bill, since patient suffered from a Stroke earlier in the year.  Upon discharge from the hospital, patient was placed into a skilled nursing facility to receive short-term rehabilitative services.  Since returning home, patient has fallen on several different occasions, unable to regain her strength and mobility, requesting that she and Regis Bill be placed into a long-term care skilled nursing facility together.  CSW is also assisting Thea with placement arrangements.  Ms. Mayer Camel indicated that patient is now completely bed/wheelchair bound, unable to perform activities of daily living independently.  Ms. Mayer Camel reported that Regis Bill has always required 24 hour care and supervision, which patient was able to  provide, prior to her Stroke.  CSW explained the entire placement process to Ms. Mayer Camel, first inquiring about patient and Thea's payor source, and how they plan to pay for long-term care skilled nursing placement.    Ms. Mayer Camel admitted that patient, nor Regis Bill, have the financial means to pay for skilled nursing out-of-pocket, as patient only receives a Social Security Income check, and Regis Bill a Social Security Disability check, once a month.  Ms. Mayer Camel went on to explain that patient does not have any assets, other then the house that she owns.  CSW explained to Ms. Mayer Camel that patient will more than likely be required to sell her house, and spend down the majority of the income received from the sell, before being eligible to receive Special Assistance Long-Term Care Medicaid, through the [facility] of Social Services.  Ms. Mayer Camel indicated that she plans to place patient's house on the market within the next few weeks, already realizing that the house will be vacant if long-term care placement is successful, for both patient and Thea.  Ms. Mayer Camel is aware that Special Assistance Long-Term Care Medicaid will pay the additional cost of what patient's Social Security Income does not cover, at the skilled nursing facility.       In the meantime, CSW encouraged Ms. Mayer Camel to complete an application for Special Assistance Long-Term Care Medicaid for patient, as well as contact Thea's medicaid case worker, to have her Adult Medicaid coverage changed to Special Assistance Long-Term Care Medicaid.  CSW agreed to mail ([address] [place], [place] [address]), and e-mail ([email or web address]), Ms. ([id number] 2 Special Assistance Long-Term Care Medicaid applications for her review.  In addition, CSW will mail and e-mail Ms.  Mayer Camel the following list of resources: 2021 Medicaid Tips, 2 [place] Medicaid Long Term Care [place] Forms, [facility] Directory of Nursing Homes, and Complete Listing of All Skilled  Nursing Facilities in [place], [place], [place], [place] and [place].  Ms. Mayer Camel was encouraged to sit down with patient, Regis Bill and her other siblings, to decide on several skilled nursing facilities of interest, so that CSW can fax patient and Thea's information and FL-2 Forms to all of their facilities of interest, to try and pursue bed offers.  CSW agreed to contact patient and Thea's Primary Care Physician, Dr. Hillard Danker, to request that she complete and sign FL-2 Forms for them both.  However, CSW explained to Ms. Mayer Camel that CSW will hold off on making this request to Dr. Okey Dupre, until it has been determined whether or not they will be eligible for Special Assistance Long-Term Care Medicaid, as the FL-2 Form expires after 30 days.  Ms. Mayer Camel indicated that she will try to schedule a family meeting within the next few weeks, as well as complete the applications for Special Assistance Long-Term Care Medicaid, and submit both applications to the [facility] Department of Social Services for processing.  Ms. Mayer Camel voiced understanding with regards to CSW explaining that this can often be a lengthy process, as it typically takes anywhere from 45-60 days, just for the Department of Social Services to process applications.  Ms. Mayer Camel indicated that she is just so happy to receive assistance, willing to wait however long it takes to ensure that patient and Regis Bill are placed into the right facility.    Ms. Mayer Camel reported that she is currently having to prepare all of patient and Thea's meals, ensuring that Thea's meals are all finely chopped or pureed, as Regis Bill has a history of Aspiration Pneumonia.  In addition, Ms. Mayer Camel stated that she is responsible for transporting patient and Regis Bill to and from all of their physician appointments, administering their medications, performing all of the grocery shopping, cleaning and folding all of the laundry, performing all of the housekeeping duties,  and assisting both of them with bathing, feeding and dressing.  CSW commended Ms. Mayer Camel for all that she is doing, and for taking the initiative to be the sole caregiver of both her mother and her sister.  Ms. Mayer Camel was agreeable to having CSW follow-up with her again next week, on Thursday, September 16, 2020, around 12:00PM.  CSW was able to confirm that Ms. Mayer Camel has the correct contact information for CSW, encouraging her to contact CSW directly if she has additional questions or if additional social work needs arise in the meantime.  Danford Bad, BSW, MSW, LCSW  Licensed Restaurant manager, fast food [facility]  Mailing [place] [address] [place], [place] [address] Physical Address-[address] [place], [place] [address] Toll Free Main # [phone] Fax # [phone] Cell # [phone]  [email or web address]

## 2020-09-16 ENCOUNTER — Other Ambulatory Visit: Payer: Self-pay | Admitting: *Deleted

## 2020-09-16 NOTE — Patient Outreach (Signed)
[facility]) Care Management  09/16/2020  Claudia [name] [birth year] [id number]   CSW was able to make contact with patient's daughter, Camille Bal today, to follow-up regarding social work services and resources for patient, as well as to ensure that she received the resources and applications that CSW mailed and e-mailed to her, per her request, last week.  Mrs. Mayer Camel confirmed receipt, stating, "I realize I have a lot of work to do and a lot of mom's affairs that need to be handled before I can even begin to consider placing her and my 84 year old disabled sister".  Mrs. Mayer Camel indicated that she had a long talk with patient and all of her siblings, and that it has been decided that they will go ahead and put patient's house on the market, For Sale By Owner, realizing that they will need that source of income to pay for patient and her sister to reside in a long-term care assisted living facility.  CSW explained to Mrs. Mayer Camel that once she is able to spend down all of patient's assets, with the exception of the $2,000.00 that she is allowed to keep in a checking or savings account, she will be able to apply for Special Assistance Long-Term Care Medicaid for patient, to supplement what patient's Social Security Income does not cover at the facility.  Mrs. Mayer Camel indicated that she will delay applying for the Special Assistance Long-Term Care Medicaid, at least until she is able to get the house out of patient's name and sold to the new owner.  CSW further explained the "look back period" that the Department of Social Services always references when applying for any type of government assistance.  Mrs. Mayer Camel voiced understanding, stating, "My brother had the papers drawn up years ago, probably 10 or more, trying to get our mother to put everything in his name, but she neglected to so, now look where we are".  Mrs. Mayer Camel reported that she is Administrator, arts, but  requested assistance with becoming patient's Lexicographer.  CSW explained to Mrs. Mayer Camel that [facility] does not provide financial power of attorney documents, only the Advanced Directives packet, which includes living will and healthcare power of attorney documents.  CSW encouraged Mrs. Mayer Camel to either, hire an attorney to have them draw up the documents, or have patient put something in writing, signing in front of witnesses and having the documents notarized for legitimacy.  Mrs. Tatum admitted that she is extremely overwhelmed with all that needs to be done on patient's behalf, unable to rely on any of her siblings because they all live out-of-[place].  CSW reminded Mrs. Mayer Camel that CSW is more than willing to assist her in every step of the process, answering questions whenever necessary.  CSW encouraged Mrs. Mayer Camel to contact CSW directly if she has additional questions, or if she needs further assistance between follow-up calls, ensuring that she has the correct contact information for CSW.  Mrs. Tatum admitted that it will definitely take her some time to get all of patient's paperwork together, sell the home, begin the process of becoming patient's payee, and/or financial power of attorney, encouraging CSW to follow-up with her again around the end of the month.  Mrs. Mayer Camel was agreeable to having CSW contact her on Monday, October 04, 2020, around 9:00AM.  Danford Bad, BSW, MSW, [employer]  Licensed Clinical Social Financial planner [facility]  Mailing [place] [address],  [place], [place] [address] Physical Address[address], [place], [place] [address] Toll Free Main # [phone] Fax # [phone] Cell # [phone]  [email or web address]

## 2020-09-21 ENCOUNTER — Other Ambulatory Visit: Payer: Self-pay | Admitting: *Deleted

## 2020-09-21 NOTE — Patient Outreach (Signed)
[facility]) Care Management  09/21/2020  Claudia [name] 1930/11/25 [id number]   Call placed to daughter to follow up on contact with CSW regarding plan for placement.  She state they have put the member's home on the market and she is completing paperwork to have member placed once home is sold.  She remains slightly weak but improving daily, completed antibiotic course for UTI.  Appetite has increased, confirms that meals on wheels started today.  Daughter is a retired Engineer, civil (consulting), state she is able to manage member's chronic medical conditions but is open to chronic disease management.  She will continue collaboration with CSW for placement process.  Denies any urgent concerns at this time, encouraged to contact this care manager with questions.  Will place referral to health coach for chronic disease management.   Kemper Durie, [place], MSN [facility] Care Management  [facility] Manager [phone]

## 2020-09-24 ENCOUNTER — Other Ambulatory Visit: Payer: Self-pay | Admitting: *Deleted

## 2020-09-27 ENCOUNTER — Telehealth: Payer: Self-pay | Admitting: Internal Medicine

## 2020-09-27 ENCOUNTER — Ambulatory Visit: Payer: Medicare Other | Admitting: Internal Medicine

## 2020-09-27 MED ORDER — FENTANYL 100 MCG/HR TD PT72: MEDICATED_PATCH | TRANSDERMAL | 0 refills | Status: DC

## 2020-09-27 NOTE — Telephone Encounter (Signed)
Patient's daughter informed rx sent. See meds.

## 2020-09-27 NOTE — Telephone Encounter (Signed)
fentaNYL (DURAGESIC) 100 MCG/HR Patients daughter calling to request a refill.  Would also like a call when refill has been sent in. [phone] [facility] [place], [place] - [address] Phone:  [phone]  Fax:  [phone]     Last seen- 11.29.21 Next apt- 02.14.22

## 2020-10-04 ENCOUNTER — Other Ambulatory Visit: Payer: Self-pay | Admitting: *Deleted

## 2020-10-04 ENCOUNTER — Encounter: Payer: Self-pay | Admitting: *Deleted

## 2020-10-04 NOTE — Patient Outreach (Signed)
[facility]) Care Management  10/04/2020  Eretria Manternach Palomarez 25-Nov-1930 [id number]   CSW was able to make contact with patient's daughter, Camille Bal today, to follow-up regarding social work services and resources for patient, as well as to continue to provide assistance with long-term care placement arrangements into an assisted living facility.  Mrs. Tatum admitted that she is still in the process of gathering all the information necessary to submit with patient's application (Special Assistance Long-Term Care Medicaid) for processing.  Mrs. Mayer Camel requested that CSW mail her two additional Special Assistance Long-Term Care Medicaid applications, as she indicated having to make so many corrections to the previous applications (for patient and patient's daughter, Lenor Provencher) that she would prefer to just start all over.  CSW voiced understanding, agreeing to place 2 applications in the mail to Mrs. Tatum today.  Mrs. Mayer Camel further reported that she and her siblings are still trying to get patient's home ready to place on the market, which can be quite a lengthy process.  Mrs. Mayer Camel recommended that she be able to contact CSW directly when she is ready to proceed with the placement process, ensuring that she has the correct contact information for CSW.  CSW was agreeable to this plan, but suggested that she follow-up with Mrs. Mayer Camel again in one month, on Monday, November 01, 2020, around 9:00AM, if a return call has not been received from Mrs. Tatum by then, just to check the status of patient's application for Special Assistance Long-Term Care Medicaid.  In addition, CSW will have Mrs. Mayer Camel report findings of their progress with fixing up patient's home, as well as to find out when they plan to put in on the market.  Danford Bad, BSW, MSW, LCSW  Licensed Restaurant manager, fast food [facility]  Mailing [place] [address]  [place], [place] [address] Physical Address-[address] [place], [place] [address] Toll Free Main # [phone] Fax # [phone] Cell # [phone]  [email or web address]

## 2020-10-12 ENCOUNTER — Encounter: Payer: Self-pay | Admitting: *Deleted

## 2020-10-12 ENCOUNTER — Other Ambulatory Visit: Payer: Self-pay | Admitting: *Deleted

## 2020-10-12 NOTE — Patient Outreach (Signed)
[facility]) Care Management  [facility] Care Manager  10/13/2020   VINCENT EHRLER 1931/09/30 [id number]  Subjective: Successful telephone outreach call to patient's daughter [name] who is the patient's primary caregiver. HIPAA identifiers obtained. Daughter states that patient is doing fairly well. Per daughter the patient has not fallen since November and seems to be gaining strength. [name] reports that the patient is steadier with her ambulation using a walker, her appetite has improved from previously, she is staying hydrated, the patient's home environment is safe, and there are no needs for DME. [facility] LCSW is assisting daughter with the application process of applying for Medicaid for her mother and will also assist with searching for a long-term nursing facility for the patient. Daughter states that both the patient and her daughter Regis Bill are now receiving Meals on Wheels. [name] explains that the patient wants to spend most of her time in the bed, she has difficulty encouraging the patient to sit up and do other activities. Daughter reports that the patient does not have any skin breakdown currently. Daughter shared that she does not have any assistance from others helping her care for her mother and sister. She does hope that the patient gets approved for Medicaid and receives an in home aid like her sister has currently so she can receive some respite time for herself. Daughter realizes that it will take some time to sale the patient's home and then find placement in a long-term care facility.   [name] reports that she does not have a B/P cuff to take the patient's blood pressure. The patient's last documented B/P was 150/82 with pulse of 109. Nurse discussed importance of monitoring the patient's B/P to ensure that it is in control and voiced concerns about the patient's nutritional status as she does have a history of malnutrition. Nurse will send patient a B/P monitor, Ensure coupons, and  information for senior community resources. Daughter did not have any further questions or concerns today.   Encounter Medications:  Outpatient Encounter Medications as of 10/12/2020  Medication Sig  . acetaminophen (TYLENOL) 500 MG tablet Take 1,000 mg by mouth every 6 (six) hours as needed for moderate pain.  Marland Kitchen diltiazem (CARDIZEM CD) 120 MG 24 hr capsule Take 1 capsule (120 mg total) by mouth in the morning and at bedtime. (Patient taking differently: Take 240 mg by mouth daily.)  . docusate sodium 100 MG CAPS Take 100 mg by mouth daily. (Patient taking differently: Take 100 mg by mouth daily as needed (constipation).)  . fentaNYL (DURAGESIC) 100 MCG/HR APPLY 1 PATCH EVERY OTHER DAY AS DIRECTED  . Lifitegrast (XIIDRA) 5 % SOLN Place 1 drop into both eyes 2 (two) times daily.  . melatonin 3 MG TABS tablet Take 1 tablet (3 mg total) by mouth at bedtime.  . mirtazapine (REMERON) 15 MG tablet Take 1 tablet (15 mg total) by mouth at bedtime.  . pantoprazole (PROTONIX) 40 MG tablet TAKE 1 TABLET EACH DAY. (Patient taking differently: Take 40 mg by mouth daily.)  . polyethylene glycol powder (GLYCOLAX/MIRALAX) powder TAKE 17 GRAMS TWICE A DAY AS NEEDED (Patient taking differently: Take 1 Container by mouth 2 (two) times daily as needed for mild constipation.)  . simvastatin (ZOCOR) 20 MG tablet Take 1 tablet (20 mg total) by mouth every evening.  . triamterene-hydrochlorothiazide (MAXZIDE-25) 37.5-25 MG tablet Take 1 tablet by mouth daily.  Marland Kitchen XIIDRA 5 % SOLN Place 1 drop into both eyes in the morning and at bedtime.  No facility-administered encounter medications on file as of 10/12/2020.    Functional Status:  In your present state of health, do you have any difficulty performing the following activities: 09/09/2020 07/26/2020  Hearing? Y Y  Comment Hard of hearing -  Vision? N Y  Difficulty concentrating or making decisions? N Y  Walking or climbing stairs? Y Y  Comment Bed bound -  Dressing  or bathing? Tempie Donning  Comment Daughter - Marrianne Mood assists -  Doing errands, shopping? Tempie Donning  Comment Daughter - Marrianne Mood assists -  Preparing Food and eating ? Y -  Comment Daughter - Marrianne Mood assists -  Using the Toilet? Y -  Comment Daughter - Marrianne Mood assists -  In the past six months, have you accidently leaked urine? Y -  Comment Bladder leakage -  Do you have problems with loss of bowel control? N -  Managing your Medications? Y -  Comment Daughter - Marrianne Mood assists -  Managing your Finances? Y -  Comment Daughter - Marrianne Mood assists -  Housekeeping or managing your Housekeeping? Y -  Comment Daughter - Marrianne Mood assists -  Some recent data might be hidden    Fall/Depression Screening: Fall Risk  10/12/2020 10/12/2020 09/09/2020  Falls in the past year? 1 1 1   Comment - - -  Number falls in past yr: 1 1 -  Comment - - -  Injury with Fall? 1 1 -  Risk for fall due to : History of fall(s);Impaired balance/gait;Impaired mobility History of fall(s);Impaired balance/gait;Impaired mobility -  Follow up Falls evaluation completed Falls evaluation completed -   PHQ 2/9 Scores 10/12/2020 09/09/2020 07/26/2020 04/02/2019 04/02/2019 04/02/2019 08/20/2015  PHQ - 2 Score - 0 0 1 1 2  0  PHQ- 9 Score - - - - - 8 -  Exception Documentation Other- indicate reason in comment box Other- indicate reason in comment box - - - - -  Not completed - All information according to daughter - Marrianne Mood - - - - -    Assessment:  Goals Addressed              This Visit's Progress     Patient Stated   .  COMPLETED: maintain health (pt-stated)        Will continue to maintain health and will drink nutritional supplements like Ensure and Boost. Resolved due to duplicate goals        Other   .  Remove Barriers to Better Nutrition        Timeframe:  Long-Range Goal Priority:  High Start Date: 10/12/20                           Expected End Date: 04/07/21                     Follow Up Date  01/21/21    - arrange meal delivery service like Meals on Wheels - eat 5 or 6 small meals each day - take my supplements as prescribed    Why is this important?    Eating healthy is not always easy.   You may not feel hungry or food might not taste good.   There are things you can do to make it easier.    Notes: Discussed supplementing with Ensure and nurse will send Ensure coupons. Patient does receives Meals on Wheels.     .  Track and  Manage My Blood Pressure-Hypertension        Timeframe:  Long-Range Goal Priority:  High Start Date:  10/12/20                           Expected End Date: 04/07/21                     Follow Up Date 01/21/21    - check blood pressure weekly - write blood pressure results in a log or diary    Why is this important?    You won't feel high blood pressure, but it can still hurt your blood vessels.   High blood pressure can cause heart or kidney problems. It can also cause a stroke.   Making lifestyle changes like losing a little weight or eating less salt will help.   Checking your blood pressure at home and at different times of the day can help to control blood pressure.   If the doctor prescribes medicine remember to take it the way the doctor ordered.   Call the office if you cannot afford the medicine or if there are questions about it.     Notes: Daughter states that patient does not have a B/P cuff at home. Nurse stated she will send patient a B/P cuff. Discussed checking the patient's B/P weekly.      Plan: RN Health Coach will send PCP a barrier letter and today's assessment note, will send patient a B/P monitor, Ensure coupons, and senior community resources, will call patient/ daughter within the month of April. Follow-up:  Patient agrees to Care Plan and Follow-up.  (Daughter agrees to future outreach calls)  Blanchie Serve RN, BSN [facility] Care Management  RN Health Coach [phone] [email or web address]

## 2020-10-13 NOTE — Patient Instructions (Addendum)
Goals Addressed              This Visit's Progress     Patient Stated   .  COMPLETED: maintain health (pt-stated)        Will continue to maintain health and will drink nutritional supplements like Ensure and Boost. Resolved due to duplicate goals        Other   .  Remove Barriers to Better Nutrition        Timeframe:  Long-Range Goal Priority:  High Start Date: 10/12/20                           Expected End Date: 04/07/21                     Follow Up Date 01/21/21    - arrange meal delivery service like Meals on Wheels - eat 5 or 6 small meals each day - take my supplements as prescribed    Why is this important?    Eating healthy is not always easy.   You may not feel hungry or food might not taste good.   There are things you can do to make it easier.    Notes: Discussed supplementing with Ensure and nurse will send Ensure coupons. Patient does receives Meals on Wheels.     .  Track and Manage My Blood Pressure-Hypertension        Timeframe:  Long-Range Goal Priority:  High Start Date:  10/12/20                           Expected End Date: 04/07/21                     Follow Up Date 01/21/21    - check blood pressure weekly - write blood pressure results in a log or diary    Why is this important?    You won't feel high blood pressure, but it can still hurt your blood vessels.   High blood pressure can cause heart or kidney problems. It can also cause a stroke.   Making lifestyle changes like losing a little weight or eating less salt will help.   Checking your blood pressure at home and at different times of the day can help to control blood pressure.   If the doctor prescribes medicine remember to take it the way the doctor ordered.   Call the office if you cannot afford the medicine or if there are questions about it.     Notes: Daughter states that patient does not have a B/P cuff at home. Nurse stated she will send patient a B/P cuff. Discussed  checking the patient's B/P weekly.

## 2020-10-25 ENCOUNTER — Ambulatory Visit: Payer: Medicare Other | Admitting: *Deleted

## 2020-10-28 ENCOUNTER — Telehealth: Payer: Self-pay | Admitting: Internal Medicine

## 2020-10-28 ENCOUNTER — Other Ambulatory Visit: Payer: Self-pay | Admitting: Internal Medicine

## 2020-10-28 MED ORDER — FENTANYL 100 MCG/HR TD PT72: MEDICATED_PATCH | TRANSDERMAL | 0 refills | Status: DC

## 2020-10-28 NOTE — Telephone Encounter (Signed)
Looks like you have already sent this medication to the pharmacy.

## 2020-10-28 NOTE — Telephone Encounter (Signed)
fentaNYL (DURAGESIC) 100 MCG/HR [facility] [place], [place] - [address] Phone:  [phone]  Fax:  [phone]     Last seen- 11.29.21 Next apt- 02.14.22 Requesting a refill, runs out today

## 2020-10-28 NOTE — Telephone Encounter (Signed)
See below

## 2020-11-01 ENCOUNTER — Encounter: Payer: Self-pay | Admitting: *Deleted

## 2020-11-01 ENCOUNTER — Other Ambulatory Visit: Payer: Self-pay | Admitting: *Deleted

## 2020-11-01 NOTE — Patient Outreach (Signed)
[facility]) Care Management  11/01/2020  Claudia [name] [birth year] [id number]   CSW was able to make contact with patient's daughter, Marrianne Mood today, to follow-up regarding long-term care placement arrangements for patient.  Mrs. [place] admitted, that despite her and her siblings recommendations otherwise, patient refuses to sell her home or consider placement, of any kind, at the present time.  Mrs. Aida Puffer went on to explain that patient has suddenly become more independent with her activities of daily living, "now that she fears that her family will try to sell her home against her will and place her into a nursing home".  Mrs. Aida Puffer reported that she will continue to be the primary caregiver for both patient and her sister, receiving intermittent care from her siblings, until they are simply unable to care for themselves anymore, then she will pursue a higher level of care for them both.    CSW will perform a case closure on patient, as all goals of treatment have been met from social work standpoint and no additional social work needs have been identified at this time, as patient is not ready to move forward with long-term care placement arrangements.  CSW will notify patient's [place] with [facility], Emelia Loron of CSW's plans to close patient's case.  CSW will fax an update to patient's Primary Care Physician, Dr. Pricilla Holm to ensure that she is aware of CSW's involvement with patient's plan of care, in addition to sending a Physician Case Closure Letter.  CSW was able to confirm that Mrs. Aida Puffer has the correct contact information for CSW, encouraging her to contact CSW directly if patient changes her mind, or if additional social work needs arise in the near future.  Nat Christen, BSW, MSW, LCSW  Licensed Education officer, [facility]  [address],  [place], [place] [address] Physical Address[address] [place], [place] [address] Toll Free Main # [phone] Fax # [phone] Cell # [phone]  [email or web address]

## 2020-11-09 ENCOUNTER — Telehealth: Payer: Self-pay | Admitting: Internal Medicine

## 2020-11-09 NOTE — Telephone Encounter (Signed)
Patient daughter called and said that the patient has been acting different. She said that the behavior is unlike her mother. She said she is miss placing things, acting out, saying rude comments. The daughter was wondering if a behavioral evaluation would be needed. The patient has an appointment on 11/12/20. Please call [phone].

## 2020-11-10 ENCOUNTER — Other Ambulatory Visit: Payer: Self-pay

## 2020-11-10 ENCOUNTER — Encounter (HOSPITAL_COMMUNITY): Payer: Self-pay | Admitting: Emergency Medicine

## 2020-11-10 ENCOUNTER — Emergency Department (HOSPITAL_COMMUNITY)
Admission: EM | Admit: 2020-11-10 | Discharge: 2020-11-10 | Disposition: A | Discharge status: Home / Self Care | Payer: Medicare Other | Source: Home / Self Care | Attending: Emergency Medicine | Admitting: Emergency Medicine

## 2020-11-10 DIAGNOSIS — R4182 Altered mental status, unspecified: Secondary | ICD-10-CM

## 2020-11-10 DIAGNOSIS — Z79899 Other long term (current) drug therapy: Secondary | ICD-10-CM | POA: Insufficient documentation

## 2020-11-10 DIAGNOSIS — M80051A Age-related osteoporosis with current pathological fracture, right femur, initial encounter for fracture: Secondary | ICD-10-CM | POA: Diagnosis not present

## 2020-11-10 DIAGNOSIS — I110 Hypertensive heart disease with heart failure: Secondary | ICD-10-CM | POA: Insufficient documentation

## 2020-11-10 DIAGNOSIS — S72141A Displaced intertrochanteric fracture of right femur, initial encounter for closed fracture: Secondary | ICD-10-CM | POA: Diagnosis not present

## 2020-11-10 DIAGNOSIS — I2510 Atherosclerotic heart disease of native coronary artery without angina pectoris: Secondary | ICD-10-CM | POA: Insufficient documentation

## 2020-11-10 DIAGNOSIS — R410 Disorientation, unspecified: Secondary | ICD-10-CM

## 2020-11-10 DIAGNOSIS — I509 Heart failure, unspecified: Secondary | ICD-10-CM | POA: Insufficient documentation

## 2020-11-10 LAB — CBC WITH DIFFERENTIAL/PLATELET
Abs Immature Granulocytes: 0.01 10*3/uL (ref 0.00–0.07)
Basophils Absolute: 0 10*3/uL (ref 0.0–0.1)
Basophils Relative: 0 %
Eosinophils Absolute: 0 10*3/uL (ref 0.0–0.5)
Eosinophils Relative: 0 %
HCT: 37.7 % (ref 36.0–46.0)
Hemoglobin: 12.5 g/dL (ref 12.0–15.0)
Immature Granulocytes: 0 %
Lymphocytes Relative: 9 %
Lymphs Abs: 0.6 10*3/uL — ABNORMAL LOW (ref 0.7–4.0)
MCH: 28.3 pg (ref 26.0–34.0)
MCHC: 33.2 g/dL (ref 30.0–36.0)
MCV: 85.3 fL (ref 80.0–100.0)
Monocytes Absolute: 0.6 10*3/uL (ref 0.1–1.0)
Monocytes Relative: 10 %
Neutro Abs: 5.2 10*3/uL (ref 1.7–7.7)
Neutrophils Relative %: 81 %
Platelets: 245 10*3/uL (ref 150–400)
RBC: 4.42 MIL/uL (ref 3.87–5.11)
RDW: 13.2 % (ref 11.5–15.5)
WBC: 6.4 10*3/uL (ref 4.0–10.5)
nRBC: 0 % (ref 0.0–0.2)

## 2020-11-10 LAB — URINALYSIS, ROUTINE W REFLEX MICROSCOPIC
Bilirubin Urine: NEGATIVE
Glucose, UA: NEGATIVE mg/dL
Hgb urine dipstick: NEGATIVE
Ketones, ur: NEGATIVE mg/dL
Nitrite: NEGATIVE
Protein, ur: NEGATIVE mg/dL
Specific Gravity, Urine: 1.006 (ref 1.005–1.030)
pH: 6 (ref 5.0–8.0)

## 2020-11-10 LAB — COMPREHENSIVE METABOLIC PANEL
ALT: 12 U/L (ref 0–44)
AST: 19 U/L (ref 15–41)
Albumin: 3.9 g/dL (ref 3.5–5.0)
Alkaline Phosphatase: 73 U/L (ref 38–126)
Anion gap: 11 (ref 5–15)
BUN: 7 mg/dL — ABNORMAL LOW (ref 8–23)
CO2: 30 mmol/L (ref 22–32)
Calcium: 9.7 mg/dL (ref 8.9–10.3)
Chloride: 95 mmol/L — ABNORMAL LOW (ref 98–111)
Creatinine, Ser: 0.99 mg/dL (ref 0.44–1.00)
GFR, Estimated: 55 mL/min — ABNORMAL LOW (ref >60)
Glucose, Bld: 117 mg/dL — ABNORMAL HIGH (ref 70–99)
Potassium: 2.9 mmol/L — ABNORMAL LOW (ref 3.5–5.1)
Sodium: 136 mmol/L (ref 135–145)
Total Bilirubin: 0.7 mg/dL (ref 0.3–1.2)
Total Protein: 7.3 g/dL (ref 6.5–8.1)

## 2020-11-10 MED ORDER — POTASSIUM CHLORIDE CRYS ER 20 MEQ PO TBCR
40.0000 meq | EXTENDED_RELEASE_TABLET | Freq: Once | ORAL | Status: AC
  Administered 2020-11-10: 40 meq via ORAL
  Filled 2020-11-10: qty 2

## 2020-11-10 NOTE — Progress Notes (Signed)
..   Transition of Care [facility]) - Emergency Department Mini Assessment   Patient Details  Name: Claudia [name] MRN: [id number] Date of Birth: Apr 06, 1931  Transition of Care [facility]) CM/SW Contact:    Elliot Cousin, RN Phone Number: [phone] 11/10/2020, 6:05 PM   Clinical Narrative:  TOC CM spoke to pt and gave permission to speak to dtr, [name]. Dtr is scheduled to provide transportation to home. Dtr will seek legal guardianship to assist pt with making decisions. Has handicap sibling at home that the pt is caregiver. ED provider updated and referral to Neurology placed to follow up on new onset changes in mental status.   ED Mini Assessment: What brought you to the Emergency Department? : agitation  Barriers to Discharge: No Barriers Identified  Barrier interventions: neurology referral  Means of departure: Car  Interventions which prevented an admission or readmission: Transportation Screening,Follow-up medical appointment    Patient Contact and Communications Key Contact 1: Otto Herb Date: 11/10/20,   Contact time: 1700 Contact Phone Number: [phone] Call outcome: daughter will provide transportation home         Admission diagnosis:  altered mental status Patient Active Problem List   Diagnosis Date Noted  . Pain   . Palliative care by specialist   . Goals of care, counseling/discussion   . DNR (do not resuscitate)   . DNI (do not intubate)   . Encounter for hospice care discussion   . Fall at home, initial encounter 07/28/2020  . Fall 07/28/2020  . Weakness   . Closed fracture of nasal bones   . Acute cystitis without hematuria   . Pain management contract signed 04/08/2020  . Protein-calorie malnutrition, severe (HCC) 03/11/2020  . Hyperlipidemia 12/20/2017  . Routine general medical examination at a health care facility 12/19/2016  . Insomnia 06/19/2015  . Narcotic dependence (HCC) 12/13/2013  . Chronic back pain 12/13/2013  .  Hypertension    PCP:  [name], MD Pharmacy:   [facility] [place], [place] - [address] [place] [address] Phone: [phone] Fax: [phone]

## 2020-11-10 NOTE — Discharge Instructions (Signed)
Return if any problems.

## 2020-11-10 NOTE — ED Triage Notes (Signed)
BIBA Per EMS:  Pt coming from home with complaints of altered mental status x2 months When 1st noticed pt was brought in and treated for a UTI but confusion only got worse  S/S include pt being paranoid, scared caregivers are trying to poison her  Vitals:  82HR 160/70  165 CBG  98.4 18  99% RA

## 2020-11-10 NOTE — ED Provider Notes (Signed)
[facility]   CSN: [id number] Arrival date & time: 11/10/20  1156     History Chief Complaint  Patient presents with  . Altered Mental Status    Claudia [name] is a 87 y.o. female.  The history is provided by the patient. No language interpreter was used.  Altered Mental Status Presenting symptoms: confusion   Severity:  Mild Episode history:  Single Timing:  Constant Chronicity:  New Context: not alcohol use   Associated symptoms: no abdominal pain and no vomiting   Pt locked the care worker who takes care of her daughter out to the home today.  Pt' sother daughter had to get a lock smith to get in the house. Pt became upset with daughter. Ems brought pt here for evaltuion.  I spoke to pt's daughter Claudia [name].  She is trying to get Mother into an assisted living or SNF.      Past Medical History:  Diagnosis Date  . Back pain   . Cataracts, both eyes   . CHF (congestive heart failure) (HCC)   . Chronic narcotic use   . Chronic pain   . Coronary artery disease   . Hyperlipidemia   . Hypertension   . Mitral valve prolapse   . Osteoarthritis   . Osteoporosis   . Pain management   . Reflux     Patient Active Problem List   Diagnosis Date Noted  . Pain   . Palliative care by specialist   . Goals of care, counseling/discussion   . DNR (do not resuscitate)   . DNI (do not intubate)   . Encounter for hospice care discussion   . Fall at home, initial encounter 07/28/2020  . Fall 07/28/2020  . Weakness   . Closed fracture of nasal bones   . Acute cystitis without hematuria   . Pain management contract signed 04/08/2020  . Protein-calorie malnutrition, severe (HCC) 03/11/2020  . Hyperlipidemia 12/20/2017  . Routine general medical examination at a health care facility 12/19/2016  . Insomnia 06/19/2015  . Narcotic dependence (HCC) 12/13/2013  . Chronic back pain 12/13/2013  . Hypertension     Past Surgical  History:  Procedure Laterality Date  . ABDOMINAL HYSTERECTOMY    . APPENDECTOMY    . BLADDER SURGERY    . BREAST SURGERY    . SHOULDER SURGERY     Right     OB History   No obstetric history on file.     Family History  Problem Relation Age of Onset  . Hypertension Mother   . Diabetes Mother   . Heart disease Father   . Hypertension Father   . Diabetes Father     Social History   Tobacco Use  . Smoking status: Never Smoker  . Smokeless tobacco: Never Used  Vaping Use  . Vaping Use: Never used  Substance Use Topics  . Alcohol use: No    Alcohol/week: 0.0 standard drinks  . Drug use: No    Home Medications Prior to Admission medications   Medication Sig Start Date End Date Taking? Authorizing Provider  acetaminophen (TYLENOL) 500 MG tablet Take 1,000 mg by mouth every 6 (six) hours as needed for moderate pain.   Yes [provider]  diltiazem (CARDIZEM CD) 120 MG 24 hr capsule Take 1 capsule (120 mg total) by mouth in the morning and at bedtime. Patient taking differently: Take 240 mg by mouth daily. 08/20/20  Yes [name], MD  docusate sodium 100 MG CAPS Take 100 mg by mouth daily. Patient taking differently: Take 100 mg by mouth daily as needed (constipation). 12/15/13  Yes Polite, Windy Fast, MD  fentaNYL (DURAGESIC) 100 MCG/HR APPLY 1 PATCH EVERY OTHER DAY AS DIRECTED Patient taking differently: Place 1 patch onto the skin every other day. 10/28/20  Yes [name], MD  melatonin 3 MG TABS tablet Take 1 tablet (3 mg total) by mouth at bedtime. 08/02/20  Yes Lanae Boast, MD  mirtazapine (REMERON) 15 MG tablet Take 1 tablet (15 mg total) by mouth at bedtime. 08/20/20  Yes [name], MD  pantoprazole (PROTONIX) 40 MG tablet TAKE 1 TABLET EACH DAY. Patient taking differently: Take 40 mg by mouth daily. 08/20/20  Yes [name], MD  polyethylene glycol powder (GLYCOLAX/MIRALAX) powder TAKE 17 GRAMS TWICE A DAY AS  NEEDED Patient taking differently: Take 17 g by mouth 2 (two) times daily as needed for mild constipation. 09/04/17  Yes [name], MD  simvastatin (ZOCOR) 20 MG tablet Take 1 tablet (20 mg total) by mouth every evening. 08/20/20  Yes [name], MD  triamterene-hydrochlorothiazide (MAXZIDE-25) 37.5-25 MG tablet Take 1 tablet by mouth daily. 08/20/20  Yes [name], MD  XIIDRA 5 % SOLN Place 1 drop into both eyes in the morning and at bedtime. 08/02/20  Yes Lanae Boast, MD    Allergies    Amitiza [lubiprostone], Benadryl [diphenhydramine], Ultram [tramadol], Aspirin, Hm lidocaine patch [lidocaine], Ibuprofen, and Sulfa antibiotics  Review of Systems   Review of Systems  Gastrointestinal: Negative for abdominal pain and vomiting.  Psychiatric/Behavioral: Positive for confusion.  All other systems reviewed and are negative.   Physical Exam Updated Vital Signs BP (!) 150/65   Pulse 72   Temp 97.7 F (36.5 C) (Oral)   Resp 16   Ht 4\' 11"  (1.499 m)   Wt 45.4 kg   SpO2 99%   BMI 20.20 kg/m   Physical Exam Vitals and nursing note reviewed.  Constitutional:      Appearance: She is well-developed and well-nourished.  HENT:     Head: Normocephalic.     Right Ear: Tympanic membrane normal.     Left Ear: Tympanic membrane normal.     Mouth/Throat:     Mouth: Mucous membranes are moist.  Eyes:     Extraocular Movements: EOM normal.     Pupils: Pupils are equal, round, and reactive to light.  Cardiovascular:     Rate and Rhythm: Normal rate.  Pulmonary:     Effort: Pulmonary effort is normal.  Abdominal:     General: There is no distension.  Musculoskeletal:        General: Normal range of motion.     Cervical back: Normal range of motion.  Skin:    General: Skin is warm.  Neurological:     General: No focal deficit present.     Mental Status: She is alert and oriented to person, place, and time.  Psychiatric:        Mood and Affect: Mood  and affect and mood normal.     ED Results / Procedures / Treatments   Labs (all labs ordered are listed, but only abnormal results are displayed) Labs Reviewed  URINALYSIS, ROUTINE W REFLEX MICROSCOPIC - Abnormal; Notable for the following components:      Result Value   Leukocytes,Ua SMALL (*)    Bacteria, UA RARE (*)    All other components within normal limits  CBC WITH DIFFERENTIAL/PLATELET - Abnormal; Notable for the following components:   Lymphs Abs 0.6 (*)    All other components within normal limits  COMPREHENSIVE METABOLIC PANEL - Abnormal; Notable for the following components:   Potassium 2.9 (*)    Chloride 95 (*)    Glucose, Bld 117 (*)    BUN 7 (*)    GFR, Estimated 55 (*)    All other components within normal limits    EKG None  Radiology No results found.  Procedures Procedures   Medications Ordered in ED Medications - No data to display  ED Course  I have reviewed the triage vital signs and the nursing notes.  Pertinent labs & imaging results that were available during my care of the patient were reviewed by me and considered in my medical decision making (see chart for details).  Clinical Course as of 11/10/20 1529  Wed Nov 10, 2020  1511 I was directly involved in this patients medical care.  [JH]    Clinical Course User Index [JH] China, Eustace Moore, MD   MDM Rules/Calculators/A&P                          MDM: urine and blood obtained. No  significant abnormality.  Pt alert and pleasant.  Transition of care spoke with family  Pt referred for Neurology consult for dementia  Final Clinical Impression(s) / ED Diagnoses Final diagnoses:  Altered mental status, unspecified altered mental status type  Confusion    Rx / DC Orders ED Discharge Orders    None    An After Visit Summary was printed and given to the patient.    Osie Cheeks 11/10/20 1854    Cheryll Cockayne, MD 11/10/20 2238

## 2020-11-11 ENCOUNTER — Encounter (HOSPITAL_COMMUNITY): Payer: Self-pay | Admitting: Family Medicine

## 2020-11-11 ENCOUNTER — Emergency Department (HOSPITAL_COMMUNITY): Payer: Medicare Other

## 2020-11-11 ENCOUNTER — Encounter: Payer: Self-pay | Admitting: Neurology

## 2020-11-11 ENCOUNTER — Inpatient Hospital Stay (HOSPITAL_COMMUNITY)
Admission: EM | Admit: 2020-11-11 | Discharge: 2020-11-20 | DRG: 480 | Disposition: A | Discharge status: Skilled Nursing Facility | Payer: Medicare Other | Attending: Internal Medicine | Admitting: Internal Medicine

## 2020-11-11 DIAGNOSIS — R339 Retention of urine, unspecified: Secondary | ICD-10-CM | POA: Diagnosis not present

## 2020-11-11 DIAGNOSIS — I341 Nonrheumatic mitral (valve) prolapse: Secondary | ICD-10-CM | POA: Diagnosis present

## 2020-11-11 DIAGNOSIS — Z90710 Acquired absence of both cervix and uterus: Secondary | ICD-10-CM

## 2020-11-11 DIAGNOSIS — R4182 Altered mental status, unspecified: Secondary | ICD-10-CM | POA: Diagnosis not present

## 2020-11-11 DIAGNOSIS — M549 Dorsalgia, unspecified: Secondary | ICD-10-CM | POA: Diagnosis present

## 2020-11-11 DIAGNOSIS — S72141A Displaced intertrochanteric fracture of right femur, initial encounter for closed fracture: Secondary | ICD-10-CM

## 2020-11-11 DIAGNOSIS — G928 Other toxic encephalopathy: Secondary | ICD-10-CM | POA: Diagnosis not present

## 2020-11-11 DIAGNOSIS — Z9049 Acquired absence of other specified parts of digestive tract: Secondary | ICD-10-CM

## 2020-11-11 DIAGNOSIS — D62 Acute posthemorrhagic anemia: Secondary | ICD-10-CM | POA: Diagnosis not present

## 2020-11-11 DIAGNOSIS — B488 Other specified mycoses: Secondary | ICD-10-CM | POA: Diagnosis present

## 2020-11-11 DIAGNOSIS — N3000 Acute cystitis without hematuria: Secondary | ICD-10-CM | POA: Diagnosis not present

## 2020-11-11 DIAGNOSIS — Y92009 Unspecified place in unspecified non-institutional (private) residence as the place of occurrence of the external cause: Secondary | ICD-10-CM | POA: Diagnosis not present

## 2020-11-11 DIAGNOSIS — I10 Essential (primary) hypertension: Secondary | ICD-10-CM | POA: Diagnosis present

## 2020-11-11 DIAGNOSIS — Z882 Allergy status to sulfonamides status: Secondary | ICD-10-CM

## 2020-11-11 DIAGNOSIS — E785 Hyperlipidemia, unspecified: Secondary | ICD-10-CM | POA: Diagnosis present

## 2020-11-11 DIAGNOSIS — N3001 Acute cystitis with hematuria: Secondary | ICD-10-CM | POA: Diagnosis not present

## 2020-11-11 DIAGNOSIS — Z20822 Contact with and (suspected) exposure to covid-19: Secondary | ICD-10-CM | POA: Diagnosis present

## 2020-11-11 DIAGNOSIS — E43 Unspecified severe protein-calorie malnutrition: Secondary | ICD-10-CM | POA: Diagnosis present

## 2020-11-11 DIAGNOSIS — M545 Low back pain, unspecified: Secondary | ICD-10-CM | POA: Diagnosis not present

## 2020-11-11 DIAGNOSIS — Z6820 Body mass index (BMI) 20.0-20.9, adult: Secondary | ICD-10-CM

## 2020-11-11 DIAGNOSIS — Z419 Encounter for procedure for purposes other than remedying health state, unspecified: Secondary | ICD-10-CM

## 2020-11-11 DIAGNOSIS — Z886 Allergy status to analgesic agent status: Secondary | ICD-10-CM

## 2020-11-11 DIAGNOSIS — M80051A Age-related osteoporosis with current pathological fracture, right femur, initial encounter for fracture: Secondary | ICD-10-CM | POA: Diagnosis present

## 2020-11-11 DIAGNOSIS — Y846 Urinary catheterization as the cause of abnormal reaction of the patient, or of later complication, without mention of misadventure at the time of the procedure: Secondary | ICD-10-CM | POA: Diagnosis not present

## 2020-11-11 DIAGNOSIS — T148XXA Other injury of unspecified body region, initial encounter: Secondary | ICD-10-CM

## 2020-11-11 DIAGNOSIS — G8929 Other chronic pain: Secondary | ICD-10-CM | POA: Diagnosis not present

## 2020-11-11 DIAGNOSIS — Z885 Allergy status to narcotic agent status: Secondary | ICD-10-CM

## 2020-11-11 DIAGNOSIS — E8889 Other specified metabolic disorders: Secondary | ICD-10-CM | POA: Diagnosis present

## 2020-11-11 DIAGNOSIS — F39 Unspecified mood [affective] disorder: Secondary | ICD-10-CM | POA: Diagnosis present

## 2020-11-11 DIAGNOSIS — I110 Hypertensive heart disease with heart failure: Secondary | ICD-10-CM | POA: Diagnosis present

## 2020-11-11 DIAGNOSIS — Z87440 Personal history of urinary (tract) infections: Secondary | ICD-10-CM

## 2020-11-11 DIAGNOSIS — W19XXXA Unspecified fall, initial encounter: Secondary | ICD-10-CM

## 2020-11-11 DIAGNOSIS — Z8249 Family history of ischemic heart disease and other diseases of the circulatory system: Secondary | ICD-10-CM | POA: Diagnosis not present

## 2020-11-11 DIAGNOSIS — Z66 Do not resuscitate: Secondary | ICD-10-CM | POA: Diagnosis present

## 2020-11-11 DIAGNOSIS — Z833 Family history of diabetes mellitus: Secondary | ICD-10-CM | POA: Diagnosis not present

## 2020-11-11 DIAGNOSIS — Z888 Allergy status to other drugs, medicaments and biological substances status: Secondary | ICD-10-CM

## 2020-11-11 DIAGNOSIS — H9190 Unspecified hearing loss, unspecified ear: Secondary | ICD-10-CM | POA: Diagnosis present

## 2020-11-11 DIAGNOSIS — S72001A Fracture of unspecified part of neck of right femur, initial encounter for closed fracture: Secondary | ICD-10-CM | POA: Diagnosis present

## 2020-11-11 DIAGNOSIS — I509 Heart failure, unspecified: Secondary | ICD-10-CM | POA: Diagnosis present

## 2020-11-11 DIAGNOSIS — K219 Gastro-esophageal reflux disease without esophagitis: Secondary | ICD-10-CM | POA: Diagnosis present

## 2020-11-11 DIAGNOSIS — R404 Transient alteration of awareness: Secondary | ICD-10-CM | POA: Diagnosis not present

## 2020-11-11 DIAGNOSIS — E876 Hypokalemia: Secondary | ICD-10-CM | POA: Diagnosis present

## 2020-11-11 DIAGNOSIS — B3741 Candidal cystitis and urethritis: Secondary | ICD-10-CM | POA: Diagnosis not present

## 2020-11-11 DIAGNOSIS — Z79891 Long term (current) use of opiate analgesic: Secondary | ICD-10-CM

## 2020-11-11 DIAGNOSIS — Z79890 Hormone replacement therapy (postmenopausal): Secondary | ICD-10-CM

## 2020-11-11 DIAGNOSIS — G894 Chronic pain syndrome: Secondary | ICD-10-CM | POA: Diagnosis present

## 2020-11-11 DIAGNOSIS — I2510 Atherosclerotic heart disease of native coronary artery without angina pectoris: Secondary | ICD-10-CM | POA: Diagnosis present

## 2020-11-11 DIAGNOSIS — Z79899 Other long term (current) drug therapy: Secondary | ICD-10-CM

## 2020-11-11 DIAGNOSIS — T83518A Infection and inflammatory reaction due to other urinary catheter, initial encounter: Secondary | ICD-10-CM | POA: Diagnosis not present

## 2020-11-11 DIAGNOSIS — W010XXA Fall on same level from slipping, tripping and stumbling without subsequent striking against object, initial encounter: Secondary | ICD-10-CM | POA: Diagnosis present

## 2020-11-11 LAB — PROTIME-INR
INR: 1.1 (ref 0.8–1.2)
Prothrombin Time: 13.4 seconds (ref 11.4–15.2)

## 2020-11-11 LAB — CBC WITH DIFFERENTIAL/PLATELET
Abs Immature Granulocytes: 0.04 10*3/uL (ref 0.00–0.07)
Basophils Absolute: 0 10*3/uL (ref 0.0–0.1)
Basophils Relative: 0 %
Eosinophils Absolute: 0 10*3/uL (ref 0.0–0.5)
Eosinophils Relative: 0 %
HCT: 33.4 % — ABNORMAL LOW (ref 36.0–46.0)
Hemoglobin: 11 g/dL — ABNORMAL LOW (ref 12.0–15.0)
Immature Granulocytes: 0 %
Lymphocytes Relative: 5 %
Lymphs Abs: 0.5 10*3/uL — ABNORMAL LOW (ref 0.7–4.0)
MCH: 28.3 pg (ref 26.0–34.0)
MCHC: 32.9 g/dL (ref 30.0–36.0)
MCV: 85.9 fL (ref 80.0–100.0)
Monocytes Absolute: 0.7 10*3/uL (ref 0.1–1.0)
Monocytes Relative: 6 %
Neutro Abs: 9.2 10*3/uL — ABNORMAL HIGH (ref 1.7–7.7)
Neutrophils Relative %: 89 %
Platelets: 230 10*3/uL (ref 150–400)
RBC: 3.89 MIL/uL (ref 3.87–5.11)
RDW: 13.4 % (ref 11.5–15.5)
WBC: 10.5 10*3/uL (ref 4.0–10.5)
nRBC: 0 % (ref 0.0–0.2)

## 2020-11-11 LAB — URINALYSIS, ROUTINE W REFLEX MICROSCOPIC
Bilirubin Urine: NEGATIVE
Glucose, UA: NEGATIVE mg/dL
Hgb urine dipstick: NEGATIVE
Ketones, ur: NEGATIVE mg/dL
Leukocytes,Ua: NEGATIVE
Nitrite: NEGATIVE
Protein, ur: NEGATIVE mg/dL
Specific Gravity, Urine: 1.006 (ref 1.005–1.030)
pH: 6 (ref 5.0–8.0)

## 2020-11-11 LAB — BASIC METABOLIC PANEL
Anion gap: 12 (ref 5–15)
BUN: 8 mg/dL (ref 8–23)
CO2: 28 mmol/L (ref 22–32)
Calcium: 9.2 mg/dL (ref 8.9–10.3)
Chloride: 98 mmol/L (ref 98–111)
Creatinine, Ser: 0.76 mg/dL (ref 0.44–1.00)
GFR, Estimated: >60 mL/min (ref >60)
Glucose, Bld: 151 mg/dL — ABNORMAL HIGH (ref 70–99)
Potassium: 3.4 mmol/L — ABNORMAL LOW (ref 3.5–5.1)
Sodium: 138 mmol/L (ref 135–145)

## 2020-11-11 LAB — MAGNESIUM: Magnesium: 1.9 mg/dL (ref 1.7–2.4)

## 2020-11-11 LAB — CK: Total CK: 87 U/L (ref 38–234)

## 2020-11-11 MED ORDER — HYDROCODONE-ACETAMINOPHEN 5-325 MG PO TABS
1.0000 | ORAL_TABLET | Freq: Four times a day (QID) | ORAL | Status: DC | PRN
  Administered 2020-11-13: 2 via ORAL
  Administered 2020-11-14: 1 via ORAL
  Filled 2020-11-11: qty 1
  Filled 2020-11-11: qty 2

## 2020-11-11 MED ORDER — LIFITEGRAST 5 % OP SOLN
1.0000 [drp] | Freq: Two times a day (BID) | OPHTHALMIC | Status: DC
  Administered 2020-11-14 – 2020-11-19 (×9): 1 [drp] via OPHTHALMIC
  Filled 2020-11-11 (×12): qty 1

## 2020-11-11 MED ORDER — MIRTAZAPINE 15 MG PO TABS
15.0000 mg | ORAL_TABLET | Freq: Every day | ORAL | Status: DC
  Administered 2020-11-11 – 2020-11-19 (×9): 15 mg via ORAL
  Filled 2020-11-11 (×10): qty 1

## 2020-11-11 MED ORDER — HYDROMORPHONE HCL 1 MG/ML IJ SOLN
0.5000 mg | INTRAMUSCULAR | Status: DC | PRN
  Administered 2020-11-12 – 2020-11-13 (×2): 0.5 mg via INTRAVENOUS
  Filled 2020-11-11 (×2): qty 1

## 2020-11-11 MED ORDER — FENTANYL 100 MCG/HR TD PT72
1.0000 | MEDICATED_PATCH | TRANSDERMAL | Status: DC
  Administered 2020-11-11 – 2020-11-19 (×5): 1 via TRANSDERMAL
  Filled 2020-11-11 (×5): qty 1

## 2020-11-11 MED ORDER — TRIAMTERENE-HCTZ 37.5-25 MG PO TABS
1.0000 | ORAL_TABLET | Freq: Every day | ORAL | Status: DC
  Administered 2020-11-11 – 2020-11-19 (×9): 1 via ORAL
  Filled 2020-11-11 (×10): qty 1

## 2020-11-11 MED ORDER — DOCUSATE SODIUM 100 MG PO CAPS: 100.0000 mg | ORAL_CAPSULE | Freq: Every day | ORAL | Status: DC | PRN

## 2020-11-11 MED ORDER — ONDANSETRON HCL 4 MG/2ML IJ SOLN: 4.0000 mg | Freq: Once | INTRAMUSCULAR | Status: DC

## 2020-11-11 MED ORDER — SIMVASTATIN 20 MG PO TABS
20.0000 mg | ORAL_TABLET | Freq: Every evening | ORAL | Status: DC
  Administered 2020-11-11 – 2020-11-15 (×5): 20 mg via ORAL
  Filled 2020-11-11 (×5): qty 1

## 2020-11-11 MED ORDER — LACTATED RINGERS IV SOLN: INTRAVENOUS | Status: DC

## 2020-11-11 MED ORDER — SODIUM CHLORIDE 0.9 % IV SOLN
2.0000 g | Freq: Once | INTRAVENOUS | Status: AC
  Administered 2020-11-11: 2 g via INTRAVENOUS
  Filled 2020-11-11: qty 20

## 2020-11-11 MED ORDER — DILTIAZEM HCL ER COATED BEADS 240 MG PO CP24
240.0000 mg | ORAL_CAPSULE | Freq: Every evening | ORAL | Status: DC
  Administered 2020-11-11 – 2020-11-19 (×9): 240 mg via ORAL
  Filled 2020-11-11 (×5): qty 1
  Filled 2020-11-11: qty 2
  Filled 2020-11-11 (×5): qty 1

## 2020-11-11 MED ORDER — FENTANYL CITRATE (PF) 100 MCG/2ML IJ SOLN
50.0000 ug | INTRAMUSCULAR | Status: DC | PRN
  Administered 2020-11-12: 50 ug via INTRAVENOUS
  Filled 2020-11-11: qty 2

## 2020-11-11 NOTE — H&P (Signed)
History and Physical    Claudia [name] DJM:[id number] DOB: 05-30-1931 DOA: 11/11/2020  PCP: [name], MD   Patient coming from:  Home  Chief Complaint: fall at home, right hip pain.   HPI: Claudia [name] is a 87 y.o. female with medical history significant for HTN, chronic back pain with chronic narcotic use who presents by EMS after a fall at home and unable to stand or bear weight after the fall. She was seen yesterday in ER for confusion and discharged home with recommendation for follow up with neurology for evaluation of dementia. This morning she was found on the floor by her daughter whom she lives with.  No family is currently at bedside. Attempted to call daughter but no answer. Pt unable to provide history and minimally responds verbally to questions. Reportedly she was walking in house when she tripped and fell. She was not able to stand up or bear weight. Cried out if right hip touched or right leg moved. Per chart pt uses rollator for assistance with ambulation but unclear if she was using at time of fall.   ED Course: Found to have right hip fracture on xray. Orthopedics, Dr. Carola Frost, has been consulted.   Review of Systems:  Accurate review of system cannot be obtained secondary to patient's confusion  Past Medical History:  Diagnosis Date  . Back pain   . Cataracts, both eyes   . CHF (congestive heart failure) (HCC)   . Chronic narcotic use   . Chronic pain   . Coronary artery disease   . Hyperlipidemia   . Hypertension   . Mitral valve prolapse   . Osteoarthritis   . Osteoporosis   . Pain management   . Reflux     Past Surgical History:  Procedure Laterality Date  . ABDOMINAL HYSTERECTOMY    . APPENDECTOMY    . BLADDER SURGERY    . BREAST SURGERY    . SHOULDER SURGERY     Right    Social History  reports that she has never smoked. She has never used smokeless tobacco. She reports that she does not drink alcohol and does not use  drugs.  Allergies  Allergen Reactions  . Amitiza [Lubiprostone] Nausea And Vomiting  . Benadryl [Diphenhydramine] Other (See Comments)    Makes me hyper  . Ultram [Tramadol]     Made sick to stomach  . Aspirin Other (See Comments)    Upsets stomach, and nervousness. "I feel like I'm going to scream."  . Hm Lidocaine Patch [Lidocaine] Rash  . Ibuprofen Other (See Comments)    Jittery, nervousness, similar to aspirin  . Sulfa Antibiotics Nausea And Vomiting and Rash    Family History  Problem Relation Age of Onset  . Hypertension Mother   . Diabetes Mother   . Heart disease Father   . Hypertension Father   . Diabetes Father      Prior to Admission medications   Medication Sig Start Date End Date Taking? Authorizing Provider  acetaminophen (TYLENOL) 500 MG tablet Take 500 mg by mouth in the morning and at bedtime.   Yes [provider]  diltiazem (CARDIZEM CD) 120 MG 24 hr capsule Take 1 capsule (120 mg total) by mouth in the morning and at bedtime. Patient taking differently: Take 240 mg by mouth every evening. 08/20/20  Yes [name], MD  docusate sodium 100 MG CAPS Take 100 mg by mouth daily. Patient taking differently: Take 100-200 mg by mouth  daily as needed (constipation). 12/15/13  Yes Polite, Windy Fast, MD  mirtazapine (REMERON) 15 MG tablet Take 1 tablet (15 mg total) by mouth at bedtime. 08/20/20  Yes [name], MD  pantoprazole (PROTONIX) 40 MG tablet TAKE 1 TABLET EACH DAY. Patient taking differently: Take 40 mg by mouth daily. 08/20/20  Yes [name], MD  simvastatin (ZOCOR) 20 MG tablet Take 1 tablet (20 mg total) by mouth every evening. 08/20/20  Yes [name], MD  triamterene-hydrochlorothiazide (MAXZIDE-25) 37.5-25 MG tablet Take 1 tablet by mouth daily. 08/20/20  Yes [name], MD  XIIDRA 5 % SOLN Place 1 drop into both eyes in the morning and at bedtime. 08/02/20  Yes Kc, Dayna Barker, MD  fentaNYL  (DURAGESIC) 100 MCG/HR APPLY 1 PATCH EVERY OTHER DAY AS DIRECTED Patient taking differently: Place 1 patch onto the skin every other day. 10/28/20   [name], MD  melatonin 3 MG TABS tablet Take 1 tablet (3 mg total) by mouth at bedtime. Patient not taking: Reported on 11/11/2020 08/02/20   Lanae Boast, MD  polyethylene glycol powder (GLYCOLAX/MIRALAX) powder TAKE 17 GRAMS TWICE A DAY AS NEEDED Patient taking differently: Take 17 g by mouth 2 (two) times daily as needed for mild constipation. 09/04/17   [name], MD    Physical Exam: Vitals:   11/11/20 1815 11/11/20 1830 11/11/20 1845 11/11/20 1900  BP: 120/67 (!) 102/57 106/60 120/68  Pulse: 87 85 86 93  Resp: 17 20 15  (!) 22  Temp:      SpO2: 98% 98% 96% 98%    Constitutional: NAD, calm, comfortable Vitals:   11/11/20 1815 11/11/20 1830 11/11/20 1845 11/11/20 1900  BP: 120/67 (!) 102/57 106/60 120/68  Pulse: 87 85 86 93  Resp: 17 20 15  (!) 22  Temp:      SpO2: 98% 98% 96% 98%   General: WDWN, Alert. Oriented to self but does not verbally respond.   Eyes: PERRL, conjunctivae normal.  Sclera nonicteric HENT:  [place]/AT, external ears normal.  Nares patent without epistasis.  Mucous membranes are moist.  Neck: Soft, normal range of motion, supple, no masses, no thyromegaly. Trachea midline Respiratory: clear to auscultation bilaterally, no wheezing, no crackles. Normal respiratory effort. No accessory muscle use.  Cardiovascular: Regular rate and rhythm, no murmurs / rubs / gallops. No extremity edema. 1+ pedal pulses. Abdomen: Soft, no tenderness, nondistended, no rebound or guarding.  No masses palpated. No hepatosplenomegaly. Bowel sounds normoactive Musculoskeletal:  Right leg shortened and externally rotated. Right hip tender to palpation anteriorly and laterally. No cyanosis. Skin: Warm, dry, intact no rashes, lesions, ulcers. No induration Neurologic:  Grip strength 4/5 bilaterally.  Babinski downgoing  bilaterally.   Labs on Admission: I have personally reviewed following labs and imaging studies  CBC: Recent Labs  Lab 11/10/20 1330 11/11/20 1732  WBC 6.4 10.5  NEUTROABS 5.2 9.2*  HGB 12.5 11.0*  HCT 37.7 33.4*  MCV 85.3 85.9  PLT 245 230    Basic Metabolic Panel: Recent Labs  Lab 11/10/20 1330 11/11/20 1732  NA 136 138  K 2.9* 3.4*  CL 95* 98  CO2 30 28  GLUCOSE 117* 151*  BUN 7* 8  CREATININE 0.99 0.76  CALCIUM 9.7 9.2    GFR: Estimated Creatinine Clearance: 33.8 mL/min (by C-G formula based on SCr of 0.76 mg/dL).  Liver Function Tests: Recent Labs  Lab 11/10/20 1330  AST 19  ALT 12  ALKPHOS 73  BILITOT 0.7  PROT 7.3  ALBUMIN 3.9    Urine analysis:    Component Value Date/Time   COLORURINE YELLOW 11/10/2020 1302   APPEARANCEUR CLEAR 11/10/2020 1302   LABSPEC 1.006 11/10/2020 1302   PHURINE 6.0 11/10/2020 1302   GLUCOSEU NEGATIVE 11/10/2020 1302   GLUCOSEU NEGATIVE 09/07/2020 1411   HGBUR NEGATIVE 11/10/2020 1302   BILIRUBINUR NEGATIVE 11/10/2020 1302   BILIRUBINUR Neg 12/16/2018 1328   KETONESUR NEGATIVE 11/10/2020 1302   PROTEINUR NEGATIVE 11/10/2020 1302   UROBILINOGEN 0.2 09/07/2020 1411   NITRITE NEGATIVE 11/10/2020 1302   LEUKOCYTESUR SMALL (A) 11/10/2020 1302    Radiological Exams on Admission: DG Chest 1 View  Result Date: 11/11/2020 CLINICAL DATA:  Post fall with right hip fracture. EXAM: CHEST  1 VIEW COMPARISON:  Radiograph 07/28/2020 FINDINGS: Patient is rotated. The heart is normal in size. There is aortic atherosclerosis and tortuosity, tortuosity exaggerated by rotation. Left lung apex not entirely included in the field of view. No pneumothorax or focal airspace disease. No pleural fluid. Bones under mineralized. Scoliotic curvature of the upper lumbar spine. No acute osseous abnormalities are seen. IMPRESSION: 1. No acute abnormality. 2. Aortic atherosclerosis and tortuosity, tortuosity exaggerated by rotation. Aortic  Atherosclerosis (ICD10-I70.0). Electronically Signed   By: Narda Rutherford M.D.   On: 11/11/2020 16:07   DG Knee 2 Views Right  Result Date: 11/11/2020 CLINICAL DATA:  Fall.  Hip fracture EXAM: RIGHT KNEE - 1-2 VIEW COMPARISON:  None FINDINGS: Normal alignment no fracture. Moderate degenerative change in the lateral joint space with mild joint space narrowing and spurring. No effusion. IMPRESSION: Negative for fracture. Electronically Signed   By: Marlan Palau M.D.   On: 11/11/2020 18:14   DG Hip Unilat  With Pelvis 2-3 Views Right  Result Date: 11/11/2020 CLINICAL DATA:  Post fall with right hip pain. EXAM: DG HIP (WITH OR WITHOUT PELVIS) 2-3V RIGHT COMPARISON:  Radiograph 10/25/2018 FINDINGS: Comminuted displaced intertrochanteric right proximal femur fracture. There is displacement of the lesser and greater trochanters and mild proximal migration of the femoral shaft. Femoral head remains seated. No additional fracture of the pelvis. Pubic rami are intact. Bones are under mineralized. Pubic symphysis and sacroiliac joints are congruent. Advanced vascular calcifications. IMPRESSION: Comminuted displaced intertrochanteric right proximal femur fracture. Electronically Signed   By: Narda Rutherford M.D.   On: 11/11/2020 16:05    EKG: Independently reviewed.  Shows normal sinus rhythm with no acute ST elevation or depression.  QTc is 448  Assessment/Plan Principal Problem:   Closed comminuted intertrochanteric fracture of proximal femur, right, initial encounter   Active Problems:   Hypertension Continue home medication of diltiazem, Maxzide. Monitor BP.     Chronic back pain Continue home pain medication of fentanyl patch. Pain medication for breakthrough pain provided    Fall at home, initial encounter      DVT prophylaxis: SCDs for DVT prophylaxis overnight with anticipated surgical repair of hip fracture in next 24-36 hours Code Status:   Full code. Pt is not able to provide answer  to question of code status so will need to readdress in am with family.   Family Communication:  No family at bedside. No answer when called phone number in chart. Disposition Plan:   Patient is from:  Home  Anticipated DC to:  SNF for rehab  Anticipated DC date:  Anticipate more than 2 midnight stay in hospital to treat acute condition  Anticipated DC barriers: Barrier to discharge will be finding SNF bed  Consults called:  Orthopedic surgery, Dr. Carola Frost  Admission status:  Inpatient   Claudean Severance Vada Yellen MD [facility]  How to contact the [facility] Attending or Consulting provider 7A - 7P or covering provider during after hours 7P -7A, for this patient?   1. Check the care team in [facility] and look for a) attending/consulting [facility] provider listed and b) the [facility] team listed 2. Log into [email or web address] and use [place]'s universal password to access. If you do not have the password, please contact the hospital operator. 3. Locate the [facility] provider you are looking for under [facility] and page to a number that you can be directly reached. 4. If you still have difficulty reaching the provider, please page the [facility] (Director on Call) for the Hospitalists listed on amion for assistance.  11/11/2020, 7:53 PM

## 2020-11-11 NOTE — ED Triage Notes (Signed)
Pt arrives via [facility] from home after having a falling while using walker. She was just seen yesterday at [facility]. She fell onto right hip having pain in right hip with some shortening.

## 2020-11-11 NOTE — ED Notes (Signed)
Patient states she normally wears her fentanyl patches on her chest, this RN and Latanya Presser, RN verified that patient did not currently have a patch on for new patch was applied.

## 2020-11-11 NOTE — Consult Note (Signed)
Orthopaedic Trauma Service (OTS) Consult   Patient ID: Claudia [name] MRN: [id number] DOB/AGE: [birth year] 87 y.o.   Reason for Consult: right hip fracture Referring Physician: Jacalyn Lefevre, MD   HPI: Claudia [name] is an 87 y.o. female who was found down by her daughter this morning. Inability to bear weight and shortening and deformity. Seen for UTI and mental status changes yesterday with Neurologic eval scheduled in May. Unclear what abx was rx'd. Patient is comfortable at rest, severe pain with motion. Denies numbness, tingling, but limited reliability. C/o mild left leg pain. On Duragesic pain patch but changing it every two days. She has been on chronic pain meds for 15 years for lumbar spine issues per daughter, and converted from pills to patch in the past. She uses a rollator for assistance with mobility. Spoke at length with her daughter by phone.   Past Medical History:  Diagnosis Date  . Back pain   . Cataracts, both eyes   . CHF (congestive heart failure) (HCC)   . Chronic narcotic use   . Chronic pain   . Coronary artery disease   . Hyperlipidemia   . Hypertension   . Mitral valve prolapse   . Osteoarthritis   . Osteoporosis   . Pain management   . Reflux     Past Surgical History:  Procedure Laterality Date  . ABDOMINAL HYSTERECTOMY    . APPENDECTOMY    . BLADDER SURGERY    . BREAST SURGERY    . SHOULDER SURGERY     Right    Family History  Problem Relation Age of Onset  . Hypertension Mother   . Diabetes Mother   . Heart disease Father   . Hypertension Father   . Diabetes Father     Social History:  reports that she has never smoked. She has never used smokeless tobacco. She reports that she does not drink alcohol and does not use drugs.  Allergies:  Allergies  Allergen Reactions  . Amitiza [Lubiprostone] Nausea And Vomiting  . Benadryl [Diphenhydramine] Other (See Comments)    Makes me hyper  . Ultram [Tramadol]     Made sick  to stomach  . Aspirin Other (See Comments)    Upsets stomach, and nervousness. "I feel like I'm going to scream."  . Hm Lidocaine Patch [Lidocaine] Rash  . Ibuprofen Other (See Comments)    Jittery, nervousness, similar to aspirin  . Sulfa Antibiotics Nausea And Vomiting and Rash    Medications: Prior to Admission:  Prior to Admission medications   Medication Sig Start Date End Date Taking? Authorizing Provider  acetaminophen (TYLENOL) 500 MG tablet Take 1,000 mg by mouth every 6 (six) hours as needed for moderate pain.   Yes [provider]  diltiazem (CARDIZEM CD) 120 MG 24 hr capsule Take 1 capsule (120 mg total) by mouth in the morning and at bedtime. Patient taking differently: Take 240 mg by mouth daily. 08/20/20  Yes [name], MD  docusate sodium 100 MG CAPS Take 100 mg by mouth daily. Patient taking differently: Take 100 mg by mouth daily as needed (constipation). 12/15/13  Yes Polite, Windy Fast, MD  fentaNYL (DURAGESIC) 100 MCG/HR APPLY 1 PATCH EVERY OTHER DAY AS DIRECTED Patient taking differently: Place 1 patch onto the skin every other day. 10/28/20  Yes [name], MD  melatonin 3 MG TABS tablet Take 1 tablet (3 mg total) by mouth at bedtime. 08/02/20  Yes Kc,  Dayna Barker, MD  mirtazapine (REMERON) 15 MG tablet Take 1 tablet (15 mg total) by mouth at bedtime. 08/20/20  Yes [name], MD  pantoprazole (PROTONIX) 40 MG tablet TAKE 1 TABLET EACH DAY. Patient taking differently: Take 40 mg by mouth daily. 08/20/20  Yes [name], MD  polyethylene glycol powder (GLYCOLAX/MIRALAX) powder TAKE 17 GRAMS TWICE A DAY AS NEEDED Patient taking differently: Take 17 g by mouth 2 (two) times daily as needed for mild constipation. 09/04/17  Yes [name], MD  simvastatin (ZOCOR) 20 MG tablet Take 1 tablet (20 mg total) by mouth every evening. 08/20/20  Yes [name], MD  triamterene-hydrochlorothiazide (MAXZIDE-25)  37.5-25 MG tablet Take 1 tablet by mouth daily. 08/20/20  Yes [name], MD  XIIDRA 5 % SOLN Place 1 drop into both eyes in the morning and at bedtime. 08/02/20  Yes Lanae Boast, MD     Results for orders placed or performed during the hospital encounter of 11/11/20 (from the past 48 hour(s))  Basic metabolic panel     Status: Abnormal   Collection Time: 11/11/20  5:32 PM  Result Value Ref Range   Sodium 138 135 - 145 mmol/L   Potassium 3.4 (L) 3.5 - 5.1 mmol/L   Chloride 98 98 - 111 mmol/L   CO2 28 22 - 32 mmol/L   Glucose, Bld 151 (H) 70 - 99 mg/dL    Comment: Glucose reference range applies only to samples taken after fasting for at least 8 hours.   BUN 8 8 - 23 mg/dL   Creatinine, Ser 8.03 0.44 - 1.00 mg/dL   Calcium 9.2 8.9 - 21.2 mg/dL   GFR, Estimated >24 >82 mL/min    Comment: (NOTE) Calculated using the CKD-EPI Creatinine Equation (2021)    Anion gap 12 5 - 15    Comment: Performed at [facility], [address], [place], [place] [address]  CBC WITH DIFFERENTIAL     Status: Abnormal   Collection Time: 11/11/20  5:32 PM  Result Value Ref Range   WBC 10.5 4.0 - 10.5 K/uL   RBC 3.89 3.87 - 5.11 MIL/uL   Hemoglobin 11.0 (L) 12.0 - 15.0 g/dL   HCT 04.8 (L) 88.9 - 16.9 %   MCV 85.9 80.0 - 100.0 fL   MCH 28.3 26.0 - 34.0 pg   MCHC 32.9 30.0 - 36.0 g/dL   RDW 45.0 38.8 - 82.8 %   Platelets 230 150 - 400 K/uL   nRBC 0.0 0.0 - 0.2 %   Neutrophils Relative % 89 %   Neutro Abs 9.2 (H) 1.7 - 7.7 K/uL   Lymphocytes Relative 5 %   Lymphs Abs 0.5 (L) 0.7 - 4.0 K/uL   Monocytes Relative 6 %   Monocytes Absolute 0.7 0.1 - 1.0 K/uL   Eosinophils Relative 0 %   Eosinophils Absolute 0.0 0.0 - 0.5 K/uL   Basophils Relative 0 %   Basophils Absolute 0.0 0.0 - 0.1 K/uL   Immature Granulocytes 0 %   Abs Immature Granulocytes 0.04 0.00 - 0.07 K/uL    Comment: Performed at [facility], [address] [place], [place] [address]  Protime-INR     Status: None    Collection Time: 11/11/20  5:32 PM  Result Value Ref Range   Prothrombin Time 13.4 11.4 - 15.2 seconds   INR 1.1 0.8 - 1.2    Comment: (NOTE) INR goal varies based on device and disease states.  Performed at [facility], [address], [place], [place] [address]   Type and screen [facility]     Status: None (Preliminary result)   Collection Time: 11/11/20  5:32 PM  Result Value Ref Range   ABO/RH(D) PENDING    Antibody Screen PENDING    Sample Expiration      11/14/2020,2359 Performed at [facility], [address] [place], [place] [address]   CK     Status: None   Collection Time: 11/11/20  5:32 PM  Result Value Ref Range   Total CK 87 38 - 234 U/L    Comment: Performed at [facility], [address], [place], [place] [address]    DG Chest 1 View  Result Date: 11/11/2020 CLINICAL DATA:  Post fall with right hip fracture. EXAM: CHEST  1 VIEW COMPARISON:  Radiograph 07/28/2020 FINDINGS: Patient is rotated. The heart is normal in size. There is aortic atherosclerosis and tortuosity, tortuosity exaggerated by rotation. Left lung apex not entirely included in the field of view. No pneumothorax or focal airspace disease. No pleural fluid. Bones under mineralized. Scoliotic curvature of the upper lumbar spine. No acute osseous abnormalities are seen. IMPRESSION: 1. No acute abnormality. 2. Aortic atherosclerosis and tortuosity, tortuosity exaggerated by rotation. Aortic Atherosclerosis (ICD10-I70.0). Electronically Signed   By: Narda Rutherford M.D.   On: 11/11/2020 16:07   DG Knee 2 Views Right  Result Date: 11/11/2020 CLINICAL DATA:  Fall.  Hip fracture EXAM: RIGHT KNEE - 1-2 VIEW COMPARISON:  None FINDINGS: Normal alignment no fracture. Moderate degenerative change in the lateral joint space with mild joint space narrowing and spurring. No effusion. IMPRESSION: Negative for fracture. Electronically Signed   By: Marlan Palau M.D.   On: 11/11/2020  18:14   DG Hip Unilat  With Pelvis 2-3 Views Right  Result Date: 11/11/2020 CLINICAL DATA:  Post fall with right hip pain. EXAM: DG HIP (WITH OR WITHOUT PELVIS) 2-3V RIGHT COMPARISON:  Radiograph 10/25/2018 FINDINGS: Comminuted displaced intertrochanteric right proximal femur fracture. There is displacement of the lesser and greater trochanters and mild proximal migration of the femoral shaft. Femoral head remains seated. No additional fracture of the pelvis. Pubic rami are intact. Bones are under mineralized. Pubic symphysis and sacroiliac joints are congruent. Advanced vascular calcifications. IMPRESSION: Comminuted displaced intertrochanteric right proximal femur fracture. Electronically Signed   By: Narda Rutherford M.D.   On: 11/11/2020 16:05    Intake/Output    None      ROS obtained from daughter; No recent fever, bleeding abnormalities, GI problems, or weight gain. Sleeps late usually, recent UTI.  Blood pressure 134/84, pulse 89, temperature 98.8 F (37.1 C), resp. rate 13, SpO2 100 %. Physical Exam NCAT, very pleasant and answering my questions appropriately at the moment    Gait: could not observe Coordination and balance: could not observe LUEx shoulder, elbow, wrist, digits- no skin wounds/ ecchymosis, nontender, no instability, no blocks to motion  Sens  Ax/R/M/U intact  Mot   Ax/ R/ PIN/ M/ AIN/ U intact  Rad 2+ RUEx shoulder, elbow, wrist, digits- no skin wounds/ ecchymosis, nontender, no instability, no blocks to motion  Sens  Ax/R/M/U intact  Mot   Ax/ R/ PIN/ M/ AIN/ U intact  Rad 2+ RLE right hip tender, swollen, no current ecchymosis  No knee effusion   Edema/ swelling controlled distally but mild and chronic  Sens: DPN, SPN, TN intact  Motor: EHL, FHL, and lessor  toe ext and flex all intact grossly  Brisk cap refill, warm to touch LLE left hip nontender, no swelling  mild ecchymosis of the left leg but no instability, crepitus, or significant pain  No knee  effusion   Edema/ swelling distally but mild and chronic  Sens: DPN, SPN, TN intact  Motor: ankle ext/flex/evers intact but hammer toes restrict motion, callous with grade one ulcer dorsal second toe   Brisk cap refill, cool to touch    Assessment/Plan: Right comminuted peritroch hip fracture for IM nailing tomorrow Hypokalemia--appreciate Medical Service Recent UTI--dose of abx tonight and tomorrow preop Recent onset mental status changes--could be related to UTI and outpatient eval scheduled.  I discussed with the patient's daughter the risks and benefits of surgery, including the possibility of infection, nerve injury, vessel injury, wound breakdown, arthritis, symptomatic hardware, DVT/ PE, loss of motion, malunion, nonunion, and need for further surgery among others.  We also specifically discussed the elevated risks of heart, stroke, and death given her age and comorbidities. She acknowledged these risks and provided verbal consent to proceed, which was witnessed by two nurses. The consent form is currently in my OR locker so it would not get misplaced in the Emergency Dept.  [name], MD [facility], [facility] [phone]   11/11/2020, 6:20 PM  [facility] [address] [place] [address] [phone] [phone] (F)    After 5pm and on the weekends please log on to Amion, go to orthopaedics and the look under the [facility] Call for the provider(s) on call. You can also call our office at [phone] and then follow the prompts to be connected to the call team.

## 2020-11-11 NOTE — ED Provider Notes (Signed)
Claudia [name] [facility] EMERGENCY DEPARTMENT Provider Note   CSN: [id number] Arrival date & time: 11/11/20  1525     History Chief Complaint  Patient presents with  . Fall    Claudia [name] is a 87 y.o. female.  The history is provided by the patient.  Fall This is a new problem. The current episode started 3 to 5 hours ago. The problem occurs constantly. The problem has not changed since onset.Pertinent negatives include no chest pain, no abdominal pain and no shortness of breath. Exacerbated by: movement. Nothing relieves the symptoms.  Hip Pain This is a new problem. The current episode started 3 to 5 hours ago. The problem occurs constantly. The problem has not changed since onset.Pertinent negatives include no chest pain, no abdominal pain and no shortness of breath. Nothing aggravates the symptoms. Nothing relieves the symptoms. She has tried nothing for the symptoms.       Past Medical History:  Diagnosis Date  . Back pain   . Cataracts, both eyes   . CHF (congestive heart failure) (HCC)   . Chronic narcotic use   . Chronic pain   . Coronary artery disease   . Hyperlipidemia   . Hypertension   . Mitral valve prolapse   . Osteoarthritis   . Osteoporosis   . Pain management   . Reflux     Patient Active Problem List   Diagnosis Date Noted  . Closed comminuted intertrochanteric fracture of proximal femur, right, initial encounter (HCC) 11/11/2020  . Closed right hip fracture (HCC) 11/11/2020  . Pain   . Palliative care by specialist   . Goals of care, counseling/discussion   . DNR (do not resuscitate)   . DNI (do not intubate)   . Encounter for hospice care discussion   . Fall at home, initial encounter 07/28/2020  . Fall 07/28/2020  . Weakness   . Closed fracture of nasal bones   . Acute cystitis without hematuria   . Pain management contract signed 04/08/2020  . Protein-calorie malnutrition, severe (HCC) 03/11/2020  . Hyperlipidemia 12/20/2017  .  Routine general medical examination at a health care facility 12/19/2016  . Insomnia 06/19/2015  . Narcotic dependence (HCC) 12/13/2013  . Chronic back pain 12/13/2013  . Hypertension     Past Surgical History:  Procedure Laterality Date  . ABDOMINAL HYSTERECTOMY    . APPENDECTOMY    . BLADDER SURGERY    . BREAST SURGERY    . SHOULDER SURGERY     Right     OB History   No obstetric history on file.     Family History  Problem Relation Age of Onset  . Hypertension Mother   . Diabetes Mother   . Heart disease Father   . Hypertension Father   . Diabetes Father     Social History   Tobacco Use  . Smoking status: Never Smoker  . Smokeless tobacco: Never Used  Vaping Use  . Vaping Use: Never used  Substance Use Topics  . Alcohol use: No    Alcohol/week: 0.0 standard drinks  . Drug use: No    Home Medications Prior to Admission medications   Medication Sig Start Date End Date Taking? Authorizing Provider  acetaminophen (TYLENOL) 500 MG tablet Take 500 mg by mouth in the morning and at bedtime.   Yes [provider]  diltiazem (CARDIZEM CD) 120 MG 24 hr capsule Take 1 capsule (120 mg total) by mouth in the morning and at bedtime.  Patient taking differently: Take 240 mg by mouth every evening. 08/20/20  Yes [name], MD  docusate sodium 100 MG CAPS Take 100 mg by mouth daily. Patient taking differently: Take 100-200 mg by mouth daily as needed (constipation). 12/15/13  Yes Polite, Windy Fast, MD  mirtazapine (REMERON) 15 MG tablet Take 1 tablet (15 mg total) by mouth at bedtime. 08/20/20  Yes [name], MD  pantoprazole (PROTONIX) 40 MG tablet TAKE 1 TABLET EACH DAY. Patient taking differently: Take 40 mg by mouth daily. 08/20/20  Yes [name], MD  simvastatin (ZOCOR) 20 MG tablet Take 1 tablet (20 mg total) by mouth every evening. 08/20/20  Yes [name], MD  triamterene-hydrochlorothiazide (MAXZIDE-25) 37.5-25 MG  tablet Take 1 tablet by mouth daily. 08/20/20  Yes [name], MD  XIIDRA 5 % SOLN Place 1 drop into both eyes in the morning and at bedtime. 08/02/20  Yes Kc, Dayna Barker, MD  fentaNYL (DURAGESIC) 100 MCG/HR APPLY 1 PATCH EVERY OTHER DAY AS DIRECTED Patient taking differently: Place 1 patch onto the skin every other day. 10/28/20   [name], MD  melatonin 3 MG TABS tablet Take 1 tablet (3 mg total) by mouth at bedtime. Patient not taking: Reported on 11/11/2020 08/02/20   Lanae Boast, MD  polyethylene glycol powder (GLYCOLAX/MIRALAX) powder TAKE 17 GRAMS TWICE A DAY AS NEEDED Patient taking differently: Take 17 g by mouth 2 (two) times daily as needed for mild constipation. 09/04/17   [name], MD    Allergies    Amitiza [lubiprostone], Benadryl [diphenhydramine], Ultram [tramadol], Aspirin, Hm lidocaine patch [lidocaine], Ibuprofen, and Sulfa antibiotics  Review of Systems   Review of Systems  Constitutional: Negative for chills and fever.  HENT: Negative for ear pain and sore throat.   Eyes: Negative for pain and visual disturbance.  Respiratory: Negative for cough and shortness of breath.   Cardiovascular: Negative for chest pain and palpitations.  Gastrointestinal: Negative for abdominal pain and vomiting.  Genitourinary: Negative for dysuria and hematuria.  Musculoskeletal: Negative for arthralgias and back pain.       R hip pain  Skin: Negative for color change and rash.  Neurological: Negative for seizures and syncope.  All other systems reviewed and are negative.   Physical Exam Updated Vital Signs BP 127/68   Pulse 89   Temp 98.8 F (37.1 C)   Resp 18   SpO2 100%   Physical Exam Constitutional:      General: She is awake. She is in acute distress (pain).     Appearance: She is underweight.  HENT:     Head: Normocephalic and atraumatic.  Cardiovascular:     Rate and Rhythm: Normal rate and regular rhythm.  Musculoskeletal:      Cervical back: Normal. No tenderness.     Thoracic back: Normal. No tenderness.     Lumbar back: Normal. No tenderness.     Right hip: Deformity, tenderness and bony tenderness present. Decreased range of motion.  Neurological:     Mental Status: She is alert. She is confused.     GCS: GCS eye subscore is 4. GCS verbal subscore is 5. GCS motor subscore is 6.     Cranial Nerves: Cranial nerves are intact.     Sensory: Sensation is intact.     Motor: Motor function is intact.     ED Results / Procedures / Treatments   Labs (all labs ordered are listed, but only abnormal results are  displayed) Labs Reviewed  BASIC METABOLIC PANEL - Abnormal; Notable for the following components:      Result Value   Potassium 3.4 (*)    Glucose, Bld 151 (*)    All other components within normal limits  CBC WITH DIFFERENTIAL/PLATELET - Abnormal; Notable for the following components:   Hemoglobin 11.0 (*)    HCT 33.4 (*)    Neutro Abs 9.2 (*)    Lymphs Abs 0.5 (*)    All other components within normal limits  SARS CORONAVIRUS 2 (TAT 6-24 HRS)  PROTIME-INR  CK  URINALYSIS, ROUTINE W REFLEX MICROSCOPIC  MAGNESIUM  CBC  BASIC METABOLIC PANEL  TYPE AND SCREEN  ABO/RH    EKG EKG Interpretation  Date/Time:  Thursday November 11 2020 17:23:17 EST Ventricular Rate:  91 PR Interval:    QRS Duration: 76 QT Interval:  364 QTC Calculation: 448 R Axis:   -27 Text Interpretation: Sinus rhythm Borderline left axis deviation Abnormal R-wave progression, early transition No significant change since last tracing Confirmed by Jacalyn Lefevre [phone]) on 11/11/2020 7:20:07 PM   Radiology DG Chest 1 View  Result Date: 11/11/2020 CLINICAL DATA:  Post fall with right hip fracture. EXAM: CHEST  1 VIEW COMPARISON:  Radiograph 07/28/2020 FINDINGS: Patient is rotated. The heart is normal in size. There is aortic atherosclerosis and tortuosity, tortuosity exaggerated by rotation. Left lung apex not entirely included  in the field of view. No pneumothorax or focal airspace disease. No pleural fluid. Bones under mineralized. Scoliotic curvature of the upper lumbar spine. No acute osseous abnormalities are seen. IMPRESSION: 1. No acute abnormality. 2. Aortic atherosclerosis and tortuosity, tortuosity exaggerated by rotation. Aortic Atherosclerosis (ICD10-I70.0). Electronically Signed   By: Narda Rutherford M.D.   On: 11/11/2020 16:07   DG Knee 2 Views Right  Result Date: 11/11/2020 CLINICAL DATA:  Fall.  Hip fracture EXAM: RIGHT KNEE - 1-2 VIEW COMPARISON:  None FINDINGS: Normal alignment no fracture. Moderate degenerative change in the lateral joint space with mild joint space narrowing and spurring. No effusion. IMPRESSION: Negative for fracture. Electronically Signed   By: Marlan Palau M.D.   On: 11/11/2020 18:14   DG Hip Unilat  With Pelvis 2-3 Views Right  Result Date: 11/11/2020 CLINICAL DATA:  Post fall with right hip pain. EXAM: DG HIP (WITH OR WITHOUT PELVIS) 2-3V RIGHT COMPARISON:  Radiograph 10/25/2018 FINDINGS: Comminuted displaced intertrochanteric right proximal femur fracture. There is displacement of the lesser and greater trochanters and mild proximal migration of the femoral shaft. Femoral head remains seated. No additional fracture of the pelvis. Pubic rami are intact. Bones are under mineralized. Pubic symphysis and sacroiliac joints are congruent. Advanced vascular calcifications. IMPRESSION: Comminuted displaced intertrochanteric right proximal femur fracture. Electronically Signed   By: Narda Rutherford M.D.   On: 11/11/2020 16:05    Procedures Procedures   Medications Ordered in ED Medications  fentaNYL (SUBLIMAZE) injection 50 mcg (has no administration in time range)  ondansetron (ZOFRAN) injection 4 mg (4 mg Intravenous Patient Refused/Not Given 11/11/20 1921)  fentaNYL (DURAGESIC) 100 MCG/HR 1 patch (1 patch Transdermal Patch Applied 11/11/20 2310)  diltiazem (CARDIZEM CD) 24 hr capsule  240 mg (240 mg Oral Given 11/11/20 2250)  simvastatin (ZOCOR) tablet 20 mg (20 mg Oral Given 11/11/20 2250)  triamterene-hydrochlorothiazide (MAXZIDE-25) 37.5-25 MG per tablet 1 tablet (1 tablet Oral Given 11/11/20 2250)  mirtazapine (REMERON) tablet 15 mg (15 mg Oral Given 11/11/20 2250)  docusate sodium (COLACE) capsule 100-200 mg (has no administration in time  range)  Lifitegrast 5 % SOLN 1 drop (1 drop Both Eyes Not Given 11/11/20 2141)  HYDROcodone-acetaminophen (NORCO/VICODIN) 5-325 MG per tablet 1-2 tablet (has no administration in time range)  lactated ringers infusion ( Intravenous New Bag/Given 11/11/20 2250)  HYDROmorphone (DILAUDID) injection 0.5 mg (has no administration in time range)  cefTRIAXone (ROCEPHIN) 2 g in sodium chloride 0.9 % 100 mL IVPB (0 g Intravenous Stopped 11/11/20 2319)    ED Course  I have reviewed the triage vital signs and the nursing notes.  Pertinent labs & imaging results that were available during my care of the patient were reviewed by me and considered in my medical decision making (see chart for details).    MDM Rules/Calculators/A&P                          This is an 87 year old with a past medical history of hypertension, chronic pain, hyperlipidemia, who presents emergency department for evaluation of right hip pain status post fall.  Patient tells me that she woke up this morning was getting food from [employer] when she sustained a fall.  I spoke with the daughter who states when she came to check on her around noon she found her on the floor with swelling of the right hip.  Daughter notes that the meal from [employer] were already in the freezer, but questions at the patient was walking back from placing food in the freezer and was not using her walker when she fell.  On exam patient is afebrile, hemodynamically stable, in mild pain distress.  There is an obvious right leg deformity with shortening of the right hip.  Neurovascularly intact distally  with good pulses, sensation, motor strength.  X-rays show comminuted displaced intertrochanteric right proximal femur fracture.  I spoke with Dr. Marcello Fennel of orthopedics who will plan to see the patient tomorrow, recommendation to admission the hospitalist for further management.  Final Clinical Impression(s) / ED Diagnoses Final diagnoses:  Closed fracture of right hip, initial encounter [facility])    Rx / DC Orders ED Discharge Orders    None       Kathleen Lime, MD 11/12/20 0040    Jacalyn Lefevre, MD 11/12/20 1555

## 2020-11-12 ENCOUNTER — Inpatient Hospital Stay (HOSPITAL_COMMUNITY): Payer: Medicare Other

## 2020-11-12 ENCOUNTER — Inpatient Hospital Stay (HOSPITAL_COMMUNITY): Payer: Medicare Other | Admitting: Certified Registered Nurse Anesthetist

## 2020-11-12 ENCOUNTER — Encounter (HOSPITAL_COMMUNITY)
Admission: EM | Disposition: A | Discharge status: Skilled Nursing Facility | Payer: Self-pay | Source: Home / Self Care | Attending: Family Medicine

## 2020-11-12 ENCOUNTER — Encounter (HOSPITAL_COMMUNITY): Payer: Self-pay | Admitting: Family Medicine

## 2020-11-12 ENCOUNTER — Ambulatory Visit: Payer: Medicare Other | Admitting: Internal Medicine

## 2020-11-12 DIAGNOSIS — S72141A Displaced intertrochanteric fracture of right femur, initial encounter for closed fracture: Secondary | ICD-10-CM | POA: Diagnosis not present

## 2020-11-12 HISTORY — PX: FEMUR IM NAIL: SHX1597

## 2020-11-12 LAB — SURGICAL PCR SCREEN
MRSA, PCR: NEGATIVE
Staphylococcus aureus: NEGATIVE

## 2020-11-12 LAB — CBC
HCT: 28.4 % — ABNORMAL LOW (ref 36.0–46.0)
Hemoglobin: 9.9 g/dL — ABNORMAL LOW (ref 12.0–15.0)
MCH: 29.3 pg (ref 26.0–34.0)
MCHC: 34.9 g/dL (ref 30.0–36.0)
MCV: 84 fL (ref 80.0–100.0)
Platelets: 213 10*3/uL (ref 150–400)
RBC: 3.38 MIL/uL — ABNORMAL LOW (ref 3.87–5.11)
RDW: 13.7 % (ref 11.5–15.5)
WBC: 9 10*3/uL (ref 4.0–10.5)
nRBC: 0 % (ref 0.0–0.2)

## 2020-11-12 LAB — BASIC METABOLIC PANEL
Anion gap: 13 (ref 5–15)
BUN: 11 mg/dL (ref 8–23)
CO2: 26 mmol/L (ref 22–32)
Calcium: 8.9 mg/dL (ref 8.9–10.3)
Chloride: 98 mmol/L (ref 98–111)
Creatinine, Ser: 0.84 mg/dL (ref 0.44–1.00)
GFR, Estimated: >60 mL/min (ref >60)
Glucose, Bld: 140 mg/dL — ABNORMAL HIGH (ref 70–99)
Potassium: 3.1 mmol/L — ABNORMAL LOW (ref 3.5–5.1)
Sodium: 137 mmol/L (ref 135–145)

## 2020-11-12 LAB — SARS CORONAVIRUS 2 (TAT 6-24 HRS): SARS Coronavirus 2: NEGATIVE

## 2020-11-12 LAB — ABO/RH: ABO/RH(D): A POS

## 2020-11-12 SURGERY — INSERTION, INTRAMEDULLARY ROD, FEMUR
Anesthesia: General | Laterality: Right

## 2020-11-12 MED ORDER — FENTANYL CITRATE (PF) 250 MCG/5ML IJ SOLN
INTRAMUSCULAR | Status: DC | PRN
  Administered 2020-11-12: 50 ug via INTRAVENOUS

## 2020-11-12 MED ORDER — LIDOCAINE 2% (20 MG/ML) 5 ML SYRINGE
INTRAMUSCULAR | Status: DC | PRN
  Administered 2020-11-12: 20 mg via INTRAVENOUS

## 2020-11-12 MED ORDER — ONDANSETRON HCL 4 MG/2ML IJ SOLN: 4.0000 mg | Freq: Four times a day (QID) | INTRAMUSCULAR | Status: DC | PRN

## 2020-11-12 MED ORDER — FENTANYL CITRATE (PF) 100 MCG/2ML IJ SOLN: 25.0000 ug | INTRAMUSCULAR | Status: DC | PRN

## 2020-11-12 MED ORDER — PHENYLEPHRINE 40 MCG/ML (10ML) SYRINGE FOR IV PUSH (FOR BLOOD PRESSURE SUPPORT)
PREFILLED_SYRINGE | INTRAVENOUS | Status: AC
  Filled 2020-11-12: qty 10

## 2020-11-12 MED ORDER — CEFAZOLIN SODIUM-DEXTROSE 2-3 GM-%(50ML) IV SOLR
INTRAVENOUS | Status: DC | PRN
Start: 2020-11-12 — End: 2020-11-12
  Administered 2020-11-12: 2 g via INTRAVENOUS

## 2020-11-12 MED ORDER — POTASSIUM CHLORIDE 10 MEQ/100ML IV SOLN
10.0000 meq | INTRAVENOUS | Status: AC
  Administered 2020-11-12 (×4): 10 meq via INTRAVENOUS
  Filled 2020-11-12: qty 100

## 2020-11-12 MED ORDER — CEFAZOLIN SODIUM-DEXTROSE 2-4 GM/100ML-% IV SOLN
INTRAVENOUS | Status: AC
  Filled 2020-11-12: qty 100

## 2020-11-12 MED ORDER — DEXAMETHASONE SODIUM PHOSPHATE 10 MG/ML IJ SOLN
INTRAMUSCULAR | Status: AC
  Filled 2020-11-12: qty 1

## 2020-11-12 MED ORDER — ONDANSETRON HCL 4 MG/2ML IJ SOLN
INTRAMUSCULAR | Status: DC | PRN
  Administered 2020-11-12: 4 mg via INTRAVENOUS

## 2020-11-12 MED ORDER — ADULT MULTIVITAMIN W/MINERALS CH
1.0000 | ORAL_TABLET | Freq: Every day | ORAL | Status: DC
  Administered 2020-11-13 – 2020-11-19 (×7): 1 via ORAL
  Filled 2020-11-12 (×7): qty 1

## 2020-11-12 MED ORDER — FENTANYL CITRATE (PF) 250 MCG/5ML IJ SOLN
INTRAMUSCULAR | Status: AC
  Filled 2020-11-12: qty 5

## 2020-11-12 MED ORDER — LIDOCAINE 2% (20 MG/ML) 5 ML SYRINGE
INTRAMUSCULAR | Status: AC
  Filled 2020-11-12: qty 5

## 2020-11-12 MED ORDER — ENOXAPARIN SODIUM 40 MG/0.4ML ~~LOC~~ SOLN
40.0000 mg | SUBCUTANEOUS | Status: DC
  Filled 2020-11-12: qty 0.4

## 2020-11-12 MED ORDER — ONDANSETRON HCL 4 MG/2ML IJ SOLN
INTRAMUSCULAR | Status: AC
  Filled 2020-11-12: qty 2

## 2020-11-12 MED ORDER — DEXAMETHASONE SODIUM PHOSPHATE 10 MG/ML IJ SOLN
INTRAMUSCULAR | Status: DC | PRN
  Administered 2020-11-12: 8 mg via INTRAVENOUS

## 2020-11-12 MED ORDER — MENTHOL 3 MG MT LOZG: 1.0000 | LOZENGE | OROMUCOSAL | Status: DC | PRN

## 2020-11-12 MED ORDER — EPHEDRINE 5 MG/ML INJ
INTRAVENOUS | Status: AC
  Filled 2020-11-12: qty 10

## 2020-11-12 MED ORDER — PHENYLEPHRINE HCL-NACL 10-0.9 MG/250ML-% IV SOLN
INTRAVENOUS | Status: DC | PRN
  Administered 2020-11-12: 25 ug/min via INTRAVENOUS

## 2020-11-12 MED ORDER — PHENOL 1.4 % MT LIQD: 1.0000 | OROMUCOSAL | Status: DC | PRN

## 2020-11-12 MED ORDER — PROPOFOL 10 MG/ML IV BOLUS
INTRAVENOUS | Status: DC | PRN
  Administered 2020-11-12: 70 mg via INTRAVENOUS

## 2020-11-12 MED ORDER — ACETAMINOPHEN 325 MG PO TABS
650.0000 mg | ORAL_TABLET | Freq: Three times a day (TID) | ORAL | Status: DC
  Administered 2020-11-12 – 2020-11-19 (×18): 650 mg via ORAL
  Filled 2020-11-12 (×20): qty 2

## 2020-11-12 MED ORDER — DOCUSATE SODIUM 100 MG PO CAPS
100.0000 mg | ORAL_CAPSULE | Freq: Two times a day (BID) | ORAL | Status: DC
Start: 2020-11-12 — End: 2020-11-20
  Administered 2020-11-12 – 2020-11-19 (×15): 100 mg via ORAL
  Filled 2020-11-12 (×15): qty 1

## 2020-11-12 MED ORDER — ONDANSETRON HCL 4 MG PO TABS: 4.0000 mg | ORAL_TABLET | Freq: Four times a day (QID) | ORAL | Status: DC | PRN

## 2020-11-12 MED ORDER — CHLORHEXIDINE GLUCONATE 0.12 % MT SOLN
OROMUCOSAL | Status: AC
  Administered 2020-11-12: 15 mL
  Filled 2020-11-12: qty 15

## 2020-11-12 MED ORDER — ROCURONIUM BROMIDE 10 MG/ML (PF) SYRINGE
PREFILLED_SYRINGE | INTRAVENOUS | Status: DC | PRN
  Administered 2020-11-12: 50 mg via INTRAVENOUS

## 2020-11-12 MED ORDER — PHENYLEPHRINE 40 MCG/ML (10ML) SYRINGE FOR IV PUSH (FOR BLOOD PRESSURE SUPPORT)
PREFILLED_SYRINGE | INTRAVENOUS | Status: DC | PRN
  Administered 2020-11-12: 120 ug via INTRAVENOUS
  Administered 2020-11-12: 80 ug via INTRAVENOUS

## 2020-11-12 MED ORDER — SUGAMMADEX SODIUM 200 MG/2ML IV SOLN
INTRAVENOUS | Status: DC | PRN
  Administered 2020-11-12: 200 mg via INTRAVENOUS

## 2020-11-12 MED ORDER — ROCURONIUM BROMIDE 10 MG/ML (PF) SYRINGE
PREFILLED_SYRINGE | INTRAVENOUS | Status: AC
  Filled 2020-11-12: qty 10

## 2020-11-12 MED ORDER — ENSURE ENLIVE PO LIQD
237.0000 mL | Freq: Two times a day (BID) | ORAL | Status: DC
  Administered 2020-11-13 – 2020-11-19 (×11): 237 mL via ORAL

## 2020-11-12 SURGICAL SUPPLY — 53 items
BIT DRILL 4.3MMS DISTAL GRDTED (BIT) ×1 IMPLANT
BIT DRILL LAG SCREW (DRILL) ×1 IMPLANT
BNDG COHESIVE 6X5 TAN STRL LF (GAUZE/BANDAGES/DRESSINGS) ×2 IMPLANT
BRUSH SCRUB EZ PLAIN DRY (MISCELLANEOUS) ×4 IMPLANT
COVER PERINEAL POST (MISCELLANEOUS) ×2 IMPLANT
COVER SURGICAL LIGHT HANDLE (MISCELLANEOUS) IMPLANT
COVER WAND RF STERILE (DRAPES) IMPLANT
DRAPE C-ARM 42X72 X-RAY (DRAPES) ×2 IMPLANT
DRAPE C-ARMOR (DRAPES) ×2 IMPLANT
DRAPE HALF SHEET 40X57 (DRAPES) ×2 IMPLANT
DRAPE INCISE IOBAN 66X45 STRL (DRAPES) ×2 IMPLANT
DRAPE ORTHO SPLIT 77X108 STRL (DRAPES) ×4
DRAPE SURG ORHT 6 SPLT 77X108 (DRAPES) ×2 IMPLANT
DRAPE U-SHAPE 47X51 STRL (DRAPES) ×2 IMPLANT
DRILL 4.3MMS DISTAL GRADUATED (BIT) ×2
DRILL LAG SCREW (DRILL) ×2
DRSG MEPILEX BORDER 4X4 (GAUZE/BANDAGES/DRESSINGS) ×2 IMPLANT
DRSG MEPILEX BORDER 4X8 (GAUZE/BANDAGES/DRESSINGS) ×2 IMPLANT
ELECT REM PT RETURN 9FT ADLT (ELECTROSURGICAL) ×2
ELECTRODE REM PT RTRN 9FT ADLT (ELECTROSURGICAL) ×1 IMPLANT
GLOVE BIO SURGEON STRL SZ7.5 (GLOVE) ×2 IMPLANT
GLOVE BIO SURGEON STRL SZ8 (GLOVE) ×2 IMPLANT
GLOVE BIOGEL PI IND STRL 7.5 (GLOVE) ×1 IMPLANT
GLOVE BIOGEL PI INDICATOR 7.5 (GLOVE) ×1
GLOVE SRG 8 PF TXTR STRL LF DI (GLOVE) ×1 IMPLANT
GLOVE SURG UNDER POLY LF SZ8 (GLOVE) ×2
GOWN STRL REUS W/ TWL LRG LVL3 (GOWN DISPOSABLE) ×3 IMPLANT
GOWN STRL REUS W/ TWL XL LVL3 (GOWN DISPOSABLE) ×3 IMPLANT
GOWN STRL REUS W/TWL LRG LVL3 (GOWN DISPOSABLE) ×6
GOWN STRL REUS W/TWL XL LVL3 (GOWN DISPOSABLE) ×6
GUIDEPIN 3.2X17.5 THRD DISP (PIN) ×4 IMPLANT
GUIDEWIRE BALL NOSE 100CM (WIRE) ×2 IMPLANT
HFN LAG SCREW 10.5MM X 85MM (Screw) ×2 IMPLANT
HFN RH 130 DEG 11MM X 360MM (Orthopedic Implant) ×2 IMPLANT
KIT BASIN OR (CUSTOM PROCEDURE TRAY) ×2 IMPLANT
KIT TURNOVER KIT B (KITS) ×2 IMPLANT
MANIFOLD NEPTUNE II (INSTRUMENTS) IMPLANT
NS IRRIG 1000ML POUR BTL (IV SOLUTION) ×2 IMPLANT
PACK GENERAL/GYN (CUSTOM PROCEDURE TRAY) ×2 IMPLANT
PAD ARMBOARD 7.5X6 YLW CONV (MISCELLANEOUS) ×4 IMPLANT
SCREW BONE CORTICAL 5.0X38 (Screw) ×2 IMPLANT
STAPLER VISISTAT 35W (STAPLE) ×2 IMPLANT
STOCKINETTE IMPERVIOUS LG (DRAPES) IMPLANT
SUT ETHILON 3 0 PS 1 (SUTURE) ×2 IMPLANT
SUT VIC AB 0 CT1 27 (SUTURE) ×2
SUT VIC AB 0 CT1 27XBRD ANBCTR (SUTURE) ×1 IMPLANT
SUT VIC AB 1 CT1 27 (SUTURE) ×2
SUT VIC AB 1 CT1 27XBRD ANBCTR (SUTURE) ×1 IMPLANT
SUT VIC AB 2-0 CT1 27 (SUTURE) ×2
SUT VIC AB 2-0 CT1 TAPERPNT 27 (SUTURE) ×1 IMPLANT
TOWEL GREEN STERILE (TOWEL DISPOSABLE) ×4 IMPLANT
TOWEL GREEN STERILE FF (TOWEL DISPOSABLE) ×2 IMPLANT
WATER STERILE IRR 1000ML POUR (IV SOLUTION) IMPLANT

## 2020-11-12 NOTE — Anesthesia Procedure Notes (Signed)
Procedure Name: Intubation Date/Time: 11/12/2020 1:26 PM Performed by: Modena Morrow, CRNA Pre-anesthesia Checklist: Patient identified, Emergency Drugs available, Suction available and Patient being monitored Patient Re-evaluated:Patient Re-evaluated prior to induction Oxygen Delivery Method: Circle system utilized Preoxygenation: Pre-oxygenation with 100% oxygen Induction Type: IV induction Ventilation: Mask ventilation without difficulty Laryngoscope Size: Miller and 2 Grade View: Grade II Tube type: Oral Tube size: 7.0 mm Number of attempts: 1 Airway Equipment and Method: Stylet and Oral airway Placement Confirmation: ETT inserted through vocal cords under direct vision,  positive ETCO2 and breath sounds checked- equal and bilateral Secured at: 22 cm Tube secured with: Tape Dental Injury: Teeth and Oropharynx as per pre-operative assessment

## 2020-11-12 NOTE — Op Note (Signed)
11/12/2020  3:10 PM  PATIENT:  Claudia [name]  87 y.o. female  PRE-OPERATIVE DIAGNOSIS:  RIGHT INTERTROCHANTERIC HIP FRACTURE  POST-OPERATIVE DIAGNOSIS:  RIGHT INTERTROCHANTERIC HIP FRACTURE  PROCEDURE:  INTRAMEDULLARY NAILING OF THE RIGHT HIP using a Biomet Affixus nail 11 X 360 MM statically locked  SURGEON:  Doralee Albino. Carola Frost, M.D.  ASSISTANT:  Montez Morita, PA-C.  ANESTHESIA:  General.  COMPLICATIONS:  None.  ESTIMATED BLOOD LOSS:  Less than 110 mL.  DISPOSITION:  To PACU.  CONDITION:  Stable.  DELAY START OF DVT PROPHYLAXIS BECAUSE OF BLEEDING RISK: NO  BRIEF SUMMARY AND INDICATION OF PROCEDURE:  Claudia [name] is a 87 y.o. year-old with multiple medical problems.  I discussed with the patient's daughter risks and benefits of surgical treatment including the potential for malunion, nonunion, symptomatic hardware, heart attack, stroke, neurovascular injury, bleeding, and others.  After full discussion, the family provided consent to proceed.  BRIEF SUMMARY OF PROCEDURE:  The patient was taken to the operating room where general anesthesia was induced.  She was positioned supine on the Hana fracture table.  A closed reduction maneuver was performed of the fractured proximal femur and this was confirmed on both AP and lateral xray views. A thorough scrub and wash with chlorhexidine and then Betadine scrub and paint was performed.  After sterile drapes and time-out, a long instrument was used to identify the appropriate starting position under C-arm on both AP and lateral images.  A 3 cm incision was made proximal to the greater trochanter.  The curved cannulated awl was inserted just medial to the tip of the lateral trochanter and then the starting guidewire advanced into the proximal femur.  This was checked on AP and lateral views.  The starting reamer was engaged with the soft tissue protected by a sleeve.  The curved ball-tipped guidewire was then inserted, making sure it was  just posterior as possible in the distal femur and across the fracture site, which stayed in a reduced position.  It was sequentially reamed up to 13 mm and an 11 x 360 mm nail inserted to the appropriate depth.  The guidewire for the lag screw was then inserted with the appropriate anteversion to make sure it was in a center-center position.  This was measured and the 80 mm lag screw placed with excellent purchase and position checked on both views.  Traction was released and compression achieved with the screw.  The set screw was then engaged within the groove of the lag screw, which was allowed to telescope.  This was followed by placement of one distal locking screw using perfect circle technique.  This was confirmed on AP and lateral images. Wounds were irrigated thoroughly, closed in a standard layered fashion. Sterile gently compressive dressings were applied.  Montez Morita, PA-C, assisted throughout.  The patient was awakened from anesthesia and transported to the PACU in stable condition.  PROGNOSIS:  The patient will be weightbearing as tolerated with physical therapy beginning DVT prophylaxis as soon as deemed stable by the Primary Care Service.  She has no range of motion precautions.  We will continue to follow through at the hospital.  Anticipate follow up in the office in 2 weeks for removal of sutures and further evaluation.    Doralee Albino. Carola Frost, M.D.

## 2020-11-12 NOTE — Transfer of Care (Signed)
Immediate Anesthesia Transfer of Care Note  Patient: Claudia [name]  Procedure(s) Performed: INTRAMEDULLARY (IM) NAIL FEMORAL (Right )  Patient Location: PACU  Anesthesia Type:General  Level of Consciousness: drowsy and patient cooperative  Airway & Oxygen Therapy: Patient Spontanous Breathing  Post-op Assessment: Report given to RN and Post -op Vital signs reviewed and stable  Post vital signs: Reviewed and stable  Last Vitals:  Vitals Value Taken Time  BP 110/80 11/12/20 1454  Temp    Pulse    Resp 21 11/12/20 1455  SpO2 100   Vitals shown include unvalidated device data.  Last Pain:  Vitals:   11/12/20 1148  TempSrc:   PainSc: 10-Worst pain ever         Complications: No complications documented.

## 2020-11-12 NOTE — Anesthesia Preprocedure Evaluation (Addendum)
Anesthesia Evaluation  Patient identified by MRN, date of birth, ID band Patient awake    Reviewed: Allergy & Precautions, NPO status , Patient's Chart, lab work & pertinent test results  History of Anesthesia Complications Negative for: history of anesthetic complications  Airway Mallampati: II  TM Distance: >3 FB Neck ROM: Full    Dental  (+) Chipped, Missing, Dental Advisory Given   Pulmonary neg pulmonary ROS,  11/11/2020 SARS coronavirus NEG   breath sounds clear to auscultation       Cardiovascular hypertension, Pt. on medications (-) angina+ CAD   Rhythm:Regular Rate:Normal     Neuro/Psych Chronic pain    GI/Hepatic Neg liver ROS, GERD  Medicated and Controlled,  Endo/Other  Glu 140  Renal/GU negative Renal ROS     Musculoskeletal  (+) Arthritis ,   Abdominal   Peds  Hematology  (+) Blood dyscrasia (Hb 9.9), anemia ,   Anesthesia Other Findings   Reproductive/Obstetrics                            Anesthesia Physical Anesthesia Plan  ASA: III  Anesthesia Plan: General   Post-op Pain Management:    Induction: Intravenous  PONV Risk Score and Plan: 3 and Ondansetron, Dexamethasone and Treatment may vary due to age or medical condition  Airway Management Planned: Oral ETT  Additional Equipment: None  Intra-op Plan:   Post-operative Plan: Extubation in OR  Informed Consent: I have reviewed the patients History and Physical, chart, labs and discussed the procedure including the risks, benefits and alternatives for the proposed anesthesia with the patient or authorized representative who has indicated his/her understanding and acceptance.     Dental advisory given  Plan Discussed with: CRNA and Surgeon  Anesthesia Plan Comments:        Anesthesia Quick Evaluation

## 2020-11-12 NOTE — Progress Notes (Signed)
Initial Nutrition Assessment  DOCUMENTATION CODES:   Not applicable  INTERVENTION:   -Once diet is advanced, add:   -Ensure Enlive po BID, each supplement provides 350 kcal and 20 grams of protein -MVI with minerals daily  NUTRITION DIAGNOSIS:   Increased nutrient needs related to post-op healing as evidenced by estimated needs.  GOAL:   Patient will meet greater than or equal to 90% of their needs  MONITOR:   PO intake,Supplement acceptance,Diet advancement,Labs,Weight trends,Skin,I & O's  REASON FOR ASSESSMENT:   Consult Assessment of nutrition requirement/status,Hip fracture protocol  ASSESSMENT:   Claudia [name] is a 87 y.o. female with medical history significant for HTN, chronic back pain with chronic narcotic use who presents by EMS after a fall at home and unable to stand or bear weight after the fall. She was seen yesterday in ER for confusion and discharged home with recommendation for follow up with neurology for evaluation of dementia. This morning she was found on the floor by her daughter whom she lives with.  No family is currently at bedside. Attempted to call daughter but no answer. Pt unable to provide history and minimally responds verbally to questions. Reportedly she was walking in house when she tripped and fell. She was not able to stand up or bear weight. Cried out if right hip touched or right leg moved. Per chart pt uses rollator for assistance with ambulation but unclear if she was using at time of fall.  Pt admitted with rt femur fracture s/p fall.   Reviewed I/O's: -495 ml x 24 hours  UOP: 650 ml x 24 hours  Per orthopedics notes, plan for IMN today. Pt is currently NPO for procedure.   Pt unavailable at time of visit. Unable to obtain further nutrition history or complete nutriton-focused physical exam at this time.   Reviewed wt hx; pt has experienced a 6.2% wt loss over the past 3 months. While this is not significant for time frame, it is  concerning given advanced age and multiple co-morbidities.   Pt is at high risk for malnutrition, however, unable to identify at this time. Pt would greatly benefit from addition of oral nutrition supplements.  Medications reviewed and include cardizem, remeron, and lactated ringers infusion @ 50 ml/hr.   Labs reviewed: K: 3.1.   Diet Order:   Diet Order            Diet NPO time specified  Diet effective midnight                 EDUCATION NEEDS:   No education needs have been identified at this time  Skin:  Skin Assessment: Skin Integrity Issues: Skin Integrity Issues:: Incisions Incisions: closed rt hip  Last BM:  Unknown  Height:   Ht Readings from Last 1 Encounters:  11/10/20 4\' 11"  (1.499 m)    Weight:   Wt Readings from Last 1 Encounters:  11/10/20 45.4 kg    Ideal Body Weight:  44.7 kg  BMI:  There is no height or weight on file to calculate BMI.  Estimated Nutritional Needs:   Kcal:  1350-1550  Protein:  65-80 grams  Fluid:  > 1.3 L    01/08/21, RD, LDN, CDCES Registered Dietitian II Certified Diabetes Care and Education Specialist Please refer to [facility] for RD and/or RD on-call/weekend/after hours pager

## 2020-11-12 NOTE — Anesthesia Postprocedure Evaluation (Signed)
Anesthesia Post Note  Patient: Claudia [name]  Procedure(s) Performed: INTRAMEDULLARY (IM) NAIL FEMORAL (Right )     Patient location during evaluation: PACU Anesthesia Type: General Level of consciousness: awake and alert and patient cooperative Pain management: pain level controlled Vital Signs Assessment: post-procedure vital signs reviewed and stable Respiratory status: spontaneous breathing, nonlabored ventilation and respiratory function stable Cardiovascular status: blood pressure returned to baseline and stable Postop Assessment: no apparent nausea or vomiting Anesthetic complications: no   No complications documented.  Last Vitals:  Vitals:   11/12/20 1635 11/12/20 1705  BP: (!) 111/56 124/64  Pulse: 74 72  Resp: 19 19  Temp:    SpO2: 99% 100%    Last Pain:  Vitals:   11/12/20 1705  TempSrc:   PainSc: 0-No pain                 Lilymarie Scroggins,E. Courtnie Brenes

## 2020-11-12 NOTE — Progress Notes (Signed)
PROGRESS NOTE    Claudia [name]  FOY:[id number] DOB: [birth year] DOA: 11/11/2020 PCP: [name], MD   Brief Narrative:  HPI: Claudia [name] is a 87 y.o. female with medical history significant for HTN, chronic back pain with chronic narcotic use who presents by EMS after a fall at home and unable to stand or bear weight after the fall. She was seen yesterday in ER for confusion and discharged home with recommendation for follow up with neurology for evaluation of dementia. This morning she was found on the floor by her daughter whom she lives with.  No family is currently at bedside. Attempted to call daughter but no answer. Pt unable to provide history and minimally responds verbally to questions. Reportedly she was walking in house when she tripped and fell. She was not able to stand up or bear weight. Cried out if right hip touched or right leg moved. Per chart pt uses rollator for assistance with ambulation but unclear if she was using at time of fall.   ED Course: Found to have right hip fracture on xray. Orthopedics, Dr. Carola Frost, has been consulted.   Assessment & Plan:   Principal Problem:   Closed comminuted intertrochanteric fracture of proximal femur, right, initial encounter [facility]) Active Problems:   Chronic back pain   Hypertension   Fall at home, initial encounter   Closed right hip fracture (HCC)  Closed comminuted right intertrochanteric fracture of proximal femur: Orthopedics on board.  She is a scheduled for surgical fix today.  Appreciate Ortho help.  Essential hypertension: Controlled.  Continue home medications.  Chronic back pain: Fentanyl patch.  Fall leading to fracture: Will be seen by PT OT after surgery.  Hyperlipidemia: Resume home dose of statin.  DVT prophylaxis: SCDs Start: 11/11/20 1954   Code Status: Full Code  Family Communication:  None present at bedside.  Plan of care discussed with patient in length and he verbalized understanding and  agreed with it.  Status is: Inpatient  Remains inpatient appropriate because:Inpatient level of care appropriate due to severity of illness   Dispo: The patient is from: Home              Anticipated d/c is to: SNF              Anticipated d/c date is: 3 days              Patient currently is not medically stable to d/c.   Difficult to place patient No        Estimated body mass index is 20.2 kg/m as calculated from the following:   Height as of 11/10/20: 4\' 11"  (1.499 m).   Weight as of 11/10/20: 45.4 kg.      Nutritional status:               Consultants:   Orthopedics  Procedures:   None  Antimicrobials:  Anti-infectives (From admission, onward)   Start     Dose/Rate Route Frequency Ordered Stop   11/11/20 2045  cefTRIAXone (ROCEPHIN) 2 g in sodium chloride 0.9 % 100 mL IVPB        2 g 200 mL/hr over 30 Minutes Intravenous  Once 11/11/20 2041 11/11/20 2319         Subjective: Patient seen and examined.  She is calm, alert but partially oriented.  Wondering if she has underlying dementia.  She has no complaint.  Objective: Vitals:   11/12/20 0630 11/12/20 0750 11/12/20 0900 11/12/20 0930  BP: (!) 126/59  107/84 (!) 125/57  Pulse: 82  84 80  Resp: 19  17 14   Temp:  98 F (36.7 C)    TempSrc:  Oral    SpO2: 99%  99% 100%   No intake or output data in the 24 hours ending 11/12/20 1058 There were no vitals filed for this visit.  Examination:  General exam: Appears calm and comfortable  Respiratory system: Clear to auscultation. Respiratory effort normal. Cardiovascular system: S1 & S2 heard, RRR. No JVD, murmurs, rubs, gallops or clicks. No pedal edema. Gastrointestinal system: Abdomen is nondistended, soft and nontender. No organomegaly or masses felt. Normal bowel sounds heard. Central nervous system: Alert and oriented. No focal neurological deficits. Extremities: Right lower extremity shortened and internally rotated Skin: No rashes,  lesions or ulcers Psychiatry: Judgement and insight appear poor   Data Reviewed: I have personally reviewed following labs and imaging studies  CBC: Recent Labs  Lab 11/10/20 1330 11/11/20 1732 11/12/20 0401  WBC 6.4 10.5 9.0  NEUTROABS 5.2 9.2*  --   HGB 12.5 11.0* 9.9*  HCT 37.7 33.4* 28.4*  MCV 85.3 85.9 84.0  PLT 245 230 213   Basic Metabolic Panel: Recent Labs  Lab 11/10/20 1330 11/11/20 1732 11/11/20 1733 11/12/20 0401  NA 136 138  --  137  K 2.9* 3.4*  --  3.1*  CL 95* 98  --  98  CO2 30 28  --  26  GLUCOSE 117* 151*  --  140*  BUN 7* 8  --  11  CREATININE 0.99 0.76  --  0.84  CALCIUM 9.7 9.2  --  8.9  MG  --   --  1.9  --    GFR: Estimated Creatinine Clearance: 32.2 mL/min (by C-G formula based on SCr of 0.84 mg/dL). Liver Function Tests: Recent Labs  Lab 11/10/20 1330  AST 19  ALT 12  ALKPHOS 73  BILITOT 0.7  PROT 7.3  ALBUMIN 3.9   No results for input(s): LIPASE, AMYLASE in the last 168 hours. No results for input(s): AMMONIA in the last 168 hours. Coagulation Profile: Recent Labs  Lab 11/11/20 1732  INR 1.1   Cardiac Enzymes: Recent Labs  Lab 11/11/20 1732  CKTOTAL 87   BNP (last 3 results) No results for input(s): PROBNP in the last 8760 hours. HbA1C: No results for input(s): HGBA1C in the last 72 hours. CBG: No results for input(s): GLUCAP in the last 168 hours. Lipid Profile: No results for input(s): CHOL, HDL, LDLCALC, TRIG, CHOLHDL, LDLDIRECT in the last 72 hours. Thyroid Function Tests: No results for input(s): TSH, T4TOTAL, FREET4, T3FREE, THYROIDAB in the last 72 hours. Anemia Panel: No results for input(s): VITAMINB12, FOLATE, FERRITIN, TIBC, IRON, RETICCTPCT in the last 72 hours. Sepsis Labs: No results for input(s): PROCALCITON, LATICACIDVEN in the last 168 hours.  Recent Results (from the past 240 hour(s))  SARS CORONAVIRUS 2 (TAT 6-24 HRS) Nasopharyngeal Nasopharyngeal Swab     Status: None   Collection Time:  11/11/20  5:29 PM   Specimen: Nasopharyngeal Swab  Result Value Ref Range Status   SARS Coronavirus 2 NEGATIVE NEGATIVE Final    Comment: (NOTE) SARS-CoV-2 target nucleic acids are NOT DETECTED.  The SARS-CoV-2 RNA is generally detectable in upper and lower respiratory specimens during the acute phase of infection. Negative results do not preclude SARS-CoV-2 infection, do not rule out co-infections with other pathogens, and should not be used as the sole basis for treatment or other patient  management decisions. Negative results must be combined with clinical observations, patient history, and epidemiological information. The expected result is Negative.  Fact Sheet for Patients: HairSlick.no  Fact Sheet for Healthcare Providers: quierodirigir.com  This test is not yet approved or cleared by the [place] FDA and  has been authorized for detection and/or diagnosis of SARS-CoV-2 by FDA under an Emergency Use Authorization (EUA). This EUA will remain  in effect (meaning this test can be used) for the duration of the COVID-19 declaration under Se ction 564(b)(1) of the Act, 21 U.S.C. section 360bbb-3(b)(1), unless the authorization is terminated or revoked sooner.  Performed at [facility], [address] [place], [place] [address]       Radiology Studies: DG Chest 1 View  Result Date: 11/11/2020 CLINICAL DATA:  Post fall with right hip fracture. EXAM: CHEST  1 VIEW COMPARISON:  Radiograph 07/28/2020 FINDINGS: Patient is rotated. The heart is normal in size. There is aortic atherosclerosis and tortuosity, tortuosity exaggerated by rotation. Left lung apex not entirely included in the field of view. No pneumothorax or focal airspace disease. No pleural fluid. Bones under mineralized. Scoliotic curvature of the upper lumbar spine. No acute osseous abnormalities are seen. IMPRESSION: 1. No acute abnormality. 2. Aortic  atherosclerosis and tortuosity, tortuosity exaggerated by rotation. Aortic Atherosclerosis (ICD10-I70.0). Electronically Signed   By: Narda Rutherford M.D.   On: 11/11/2020 16:07   DG Knee 2 Views Right  Result Date: 11/11/2020 CLINICAL DATA:  Fall.  Hip fracture EXAM: RIGHT KNEE - 1-2 VIEW COMPARISON:  None FINDINGS: Normal alignment no fracture. Moderate degenerative change in the lateral joint space with mild joint space narrowing and spurring. No effusion. IMPRESSION: Negative for fracture. Electronically Signed   By: Marlan Palau M.D.   On: 11/11/2020 18:14   DG Hip Unilat  With Pelvis 2-3 Views Right  Result Date: 11/11/2020 CLINICAL DATA:  Post fall with right hip pain. EXAM: DG HIP (WITH OR WITHOUT PELVIS) 2-3V RIGHT COMPARISON:  Radiograph 10/25/2018 FINDINGS: Comminuted displaced intertrochanteric right proximal femur fracture. There is displacement of the lesser and greater trochanters and mild proximal migration of the femoral shaft. Femoral head remains seated. No additional fracture of the pelvis. Pubic rami are intact. Bones are under mineralized. Pubic symphysis and sacroiliac joints are congruent. Advanced vascular calcifications. IMPRESSION: Comminuted displaced intertrochanteric right proximal femur fracture. Electronically Signed   By: Narda Rutherford M.D.   On: 11/11/2020 16:05    Scheduled Meds: . diltiazem  240 mg Oral QPM  . fentaNYL  1 patch Transdermal QODAY  . Lifitegrast  1 drop Both Eyes BID  . mirtazapine  15 mg Oral QHS  . ondansetron (ZOFRAN) IV  4 mg Intravenous Once  . simvastatin  20 mg Oral QPM  . triamterene-hydrochlorothiazide  1 tablet Oral Daily   Continuous Infusions: . lactated ringers 50 mL/hr at 11/12/20 0743  . potassium chloride 10 mEq (11/12/20 0940)     LOS: 1 day   Time spent: 35 minutes   Hughie Closs, MD [facility]  11/12/2020, 10:58 AM   To contact the attending provider between 7A-7P or the covering provider during after  hours 7P-7A, please log into the web site [email or web address].

## 2020-11-12 NOTE — Progress Notes (Addendum)
No clinical changes overnight. K increased to 3.1. Rec'd Rocephin yesterday for UTI.  Plan for repair of right hip fracture as discussed and consented for.  [name], MD [facility], [facility] [phone]

## 2020-11-13 DIAGNOSIS — S72141A Displaced intertrochanteric fracture of right femur, initial encounter for closed fracture: Secondary | ICD-10-CM | POA: Diagnosis not present

## 2020-11-13 LAB — CBC WITH DIFFERENTIAL/PLATELET
Abs Immature Granulocytes: 0.03 10*3/uL (ref 0.00–0.07)
Abs Immature Granulocytes: 0.06 10*3/uL (ref 0.00–0.07)
Basophils Absolute: 0 10*3/uL (ref 0.0–0.1)
Basophils Absolute: 0 10*3/uL (ref 0.0–0.1)
Basophils Relative: 0 %
Basophils Relative: 0 %
Eosinophils Absolute: 0 10*3/uL (ref 0.0–0.5)
Eosinophils Absolute: 0 10*3/uL (ref 0.0–0.5)
Eosinophils Relative: 0 %
Eosinophils Relative: 0 %
HCT: 24.6 % — ABNORMAL LOW (ref 36.0–46.0)
HCT: 21.6 % — ABNORMAL LOW (ref 36.0–46.0)
Hemoglobin: 8 g/dL — ABNORMAL LOW (ref 12.0–15.0)
Hemoglobin: 7.1 g/dL — ABNORMAL LOW (ref 12.0–15.0)
Immature Granulocytes: 0 %
Immature Granulocytes: 1 %
Lymphocytes Relative: 8 %
Lymphocytes Relative: 7 %
Lymphs Abs: 0.8 10*3/uL (ref 0.7–4.0)
Lymphs Abs: 0.8 10*3/uL (ref 0.7–4.0)
MCH: 27.7 pg (ref 26.0–34.0)
MCH: 28.4 pg (ref 26.0–34.0)
MCHC: 32.5 g/dL (ref 30.0–36.0)
MCHC: 32.9 g/dL (ref 30.0–36.0)
MCV: 85.1 fL (ref 80.0–100.0)
MCV: 86.4 fL (ref 80.0–100.0)
Monocytes Absolute: 0.9 10*3/uL (ref 0.1–1.0)
Monocytes Absolute: 1.3 10*3/uL — ABNORMAL HIGH (ref 0.1–1.0)
Monocytes Relative: 9 %
Monocytes Relative: 11 %
Neutro Abs: 8.2 10*3/uL — ABNORMAL HIGH (ref 1.7–7.7)
Neutro Abs: 9.4 10*3/uL — ABNORMAL HIGH (ref 1.7–7.7)
Neutrophils Relative %: 83 %
Neutrophils Relative %: 81 %
Platelets: 186 10*3/uL (ref 150–400)
Platelets: 190 10*3/uL (ref 150–400)
RBC: 2.89 MIL/uL — ABNORMAL LOW (ref 3.87–5.11)
RBC: 2.5 MIL/uL — ABNORMAL LOW (ref 3.87–5.11)
RDW: 13.8 % (ref 11.5–15.5)
RDW: 13.8 % (ref 11.5–15.5)
WBC: 9.8 10*3/uL (ref 4.0–10.5)
WBC: 11.6 10*3/uL — ABNORMAL HIGH (ref 4.0–10.5)
nRBC: 0 % (ref 0.0–0.2)
nRBC: 0 % (ref 0.0–0.2)

## 2020-11-13 LAB — BASIC METABOLIC PANEL
Anion gap: 12 (ref 5–15)
BUN: 15 mg/dL (ref 8–23)
CO2: 26 mmol/L (ref 22–32)
Calcium: 8.4 mg/dL — ABNORMAL LOW (ref 8.9–10.3)
Chloride: 98 mmol/L (ref 98–111)
Creatinine, Ser: 0.99 mg/dL (ref 0.44–1.00)
GFR, Estimated: 55 mL/min — ABNORMAL LOW (ref >60)
Glucose, Bld: 162 mg/dL — ABNORMAL HIGH (ref 70–99)
Potassium: 4 mmol/L (ref 3.5–5.1)
Sodium: 136 mmol/L (ref 135–145)

## 2020-11-13 LAB — VITAMIN D 25 HYDROXY (VIT D DEFICIENCY, FRACTURES): Vit D, 25-Hydroxy: 16.71 ng/mL — ABNORMAL LOW (ref 30–100)

## 2020-11-13 LAB — MAGNESIUM: Magnesium: 1.7 mg/dL (ref 1.7–2.4)

## 2020-11-13 MED ORDER — ENOXAPARIN SODIUM 30 MG/0.3ML ~~LOC~~ SOLN
30.0000 mg | SUBCUTANEOUS | Status: DC
  Administered 2020-11-13: 30 mg via SUBCUTANEOUS
  Filled 2020-11-13: qty 0.3

## 2020-11-13 NOTE — Progress Notes (Signed)
Orthopaedic Trauma Progress Note  SUBJECTIVE: Doesn't want to be talked to or messed with this morning. Doesn't want her leg looked at but denies and significant pain.  Does not want to participate in exam.  No chest pain. No SOB. No nausea/vomiting. No other complaints.   OBJECTIVE:  Vitals:   11/12/20 2058 11/13/20 0300  BP: 113/66 102/65  Pulse: 91 84  Resp: 16 18  Temp: 98.7 F (37.1 C) 97.6 F (36.4 C)  SpO2: 100% 98%    General: Laying in bed, NAd Respiratory: No increased work of breathing.  Right Lower Extremity: From what I can see, patient's dressings clean, dry, intact.  Endorses sensation to light touch distally.  Difficult to obtain motor function exam secondary to patient not wanting to participate.  Skin warm and dry.  2+ DP pulse  IMAGING: Stable post op imaging.   LABS:  Results for orders placed or performed during the hospital encounter of 11/11/20 (from the past 24 hour(s))  Surgical pcr screen     Status: None   Collection Time: 11/12/20 12:38 PM   Specimen: Nasal Mucosa; Nasal Swab  Result Value Ref Range   MRSA, PCR NEGATIVE NEGATIVE   Staphylococcus aureus NEGATIVE NEGATIVE  CBC with Differential/Platelet     Status: Abnormal   Collection Time: 11/13/20  3:38 AM  Result Value Ref Range   WBC 9.8 4.0 - 10.5 K/uL   RBC 2.89 (L) 3.87 - 5.11 MIL/uL   Hemoglobin 8.0 (L) 12.0 - 15.0 g/dL   HCT 30.9 (L) 40.7 - 68.0 %   MCV 85.1 80.0 - 100.0 fL   MCH 27.7 26.0 - 34.0 pg   MCHC 32.5 30.0 - 36.0 g/dL   RDW 88.1 10.3 - 15.9 %   Platelets 186 150 - 400 K/uL   nRBC 0.0 0.0 - 0.2 %   Neutrophils Relative % 83 %   Neutro Abs 8.2 (H) 1.7 - 7.7 K/uL   Lymphocytes Relative 8 %   Lymphs Abs 0.8 0.7 - 4.0 K/uL   Monocytes Relative 9 %   Monocytes Absolute 0.9 0.1 - 1.0 K/uL   Eosinophils Relative 0 %   Eosinophils Absolute 0.0 0.0 - 0.5 K/uL   Basophils Relative 0 %   Basophils Absolute 0.0 0.0 - 0.1 K/uL   Immature Granulocytes 0 %   Abs Immature  Granulocytes 0.03 0.00 - 0.07 K/uL  Basic metabolic panel     Status: Abnormal   Collection Time: 11/13/20  3:38 AM  Result Value Ref Range   Sodium 136 135 - 145 mmol/L   Potassium 4.0 3.5 - 5.1 mmol/L   Chloride 98 98 - 111 mmol/L   CO2 26 22 - 32 mmol/L   Glucose, Bld 162 (H) 70 - 99 mg/dL   BUN 15 8 - 23 mg/dL   Creatinine, Ser 4.58 0.44 - 1.00 mg/dL   Calcium 8.4 (L) 8.9 - 10.3 mg/dL   GFR, Estimated 55 (L) >60 mL/min   Anion gap 12 5 - 15  Magnesium     Status: None   Collection Time: 11/13/20  3:38 AM  Result Value Ref Range   Magnesium 1.7 1.7 - 2.4 mg/dL    ASSESSMENT: Claudia [name] is a 87 y.o. female, 1 Day Post-Op s/p RIGHT INTRAMEDULLARY NAIL FEMORAL  CV/Blood loss: Acute blood loss anemia, Hgb 8.0 this morning. Hemodynamically stable  PLAN: Weightbearing: WBAT RLE Incisional and dressing care: Reinforce dressings as needed  Showering: Ok to begin showering with assistance  Monday 11/15/20 if no drainage from incision Orthopedic device(s): None  Pain management:  1. Tylenol 650 mg q 8 hours scheduled 2. Norco 5-325 mg q 6 hours PRN 3. Dilaudid 0.5 mg q 3 hours PRN 4. Fentanyl 50 mcg q 1 hour PRN 5. Fentanyl patch q 72 hours VTE prophylaxis: Lovenox, SCDs ID: Perioperative antibiotics completed Foley/Lines: No foley, KVO IVFs Impediments to Fracture Healing:  Vit D level pending, will start supplementation as indicated Dispo: PT/OT eval today, dispo pending.  Continue to monitor CBC Follow - up plan: TBD   Eythan Jayne A. Michaelyn Barter, PA-C [phone] (office) [email or web address]

## 2020-11-13 NOTE — Progress Notes (Signed)
PROGRESS NOTE    Claudia [name]  QQV:[id number] DOB: 1931-01-10 DOA: 11/11/2020 PCP: [name], MD   Brief Narrative:  Claudia [name] is a 87 y.o. female with medical history significant for HTN, chronic back pain with chronic narcotic use who presented by EMS after a fall at home and unable to stand or bear weight after the fall. She was seen a day prior in ER for confusion and discharged home with recommendation for follow up with neurology for evaluation of dementia. This morning she was found on the floor by her daughter whom she lives with.  Upon presentation to ED, she was hemodynamically stable however Found to have right hip fracture on xray.  Admitted to hospitalist service and orthopedics consulted.  Assessment & Plan:   Principal Problem:   Closed comminuted intertrochanteric fracture of proximal femur, right, initial encounter [facility]) Active Problems:   Chronic back pain   Hypertension   Fall at home, initial encounter   Closed right hip fracture (HCC)  Closed comminuted right intertrochanteric fracture of proximal femur: Orthopedics on board.  Postoperative day 1 status post intramedullary nailing of the right hip.  Pain controlled.  Management per orthopedics.  Acute blood loss anemia: Presented with hemoglobin over 12 and now 8.0 after surgery.  Will recheck at 6 PM today.  Will transfuse if less than 7.  Recheck in the morning again.  Essential hypertension: Controlled.  Continue home medications.  Chronic back pain: Fentanyl patch.  Fall leading to fracture: Will be seen by PT OT after surgery.  Hyperlipidemia: Continue statin  DVT prophylaxis: enoxaparin (LOVENOX) injection 40 mg Start: 11/13/20 0800 SCDs Start: 11/12/20 1755 SCDs Start: 11/11/20 1954   Code Status: Full Code  Family Communication:  None present at bedside.  Plan of care discussed with patient in length and he verbalized understanding and agreed with it.  Status is:  Inpatient  Remains inpatient appropriate because:Inpatient level of care appropriate due to severity of illness   Dispo: The patient is from: Home              Anticipated d/c is to: SNF              Anticipated d/c date is: 2 to 3 days              Patient currently is not medically stable to d/c.   Difficult to place patient No        Estimated body mass index is 20.2 kg/m as calculated from the following:   Height as of 11/10/20: 4\' 11"  (1.499 m).   Weight as of 11/10/20: 45.4 kg.      Nutritional status:  Nutrition Problem: Increased nutrient needs Etiology: post-op healing   Signs/Symptoms: estimated needs   Interventions: Ensure Enlive (each supplement provides 350kcal and 20 grams of protein),MVI    Consultants:   Orthopedics  Procedures:   As above  Antimicrobials:  Anti-infectives (From admission, onward)   Start     Dose/Rate Route Frequency Ordered Stop   11/12/20 1306  ceFAZolin (ANCEF) 2-4 GM/100ML-% IVPB       Note to Pharmacy: 01/10/21, Tammy   : cabinet override      11/12/20 1306 11/13/20 0114   11/11/20 2045  cefTRIAXone (ROCEPHIN) 2 g in sodium chloride 0.9 % 100 mL IVPB        2 g 200 mL/hr over 30 Minutes Intravenous  Once 11/11/20 2041 11/11/20 2319  Subjective: Patient seen and examined.  Alert and pleasant.  Denies any complaint.  Objective: Vitals:   11/12/20 1735 11/12/20 1744 11/12/20 2058 11/13/20 0300  BP: 135/70 (!) 106/54 113/66 102/65  Pulse: 70 81 91 84  Resp: 18 18 16 18   Temp:   98.7 F (37.1 C) 97.6 F (36.4 C)  TempSrc:    Oral  SpO2: 100% 100% 100% 98%    Intake/Output Summary (Last 24 hours) at 11/13/2020 1159 Last data filed at 11/13/2020 0700 Gross per 24 hour  Intake 991.4 ml  Output 1075 ml  Net -83.6 ml   There were no vitals filed for this visit.  Examination:  General exam: Appears calm and comfortable  Respiratory system: Clear to auscultation. Respiratory effort  normal. Cardiovascular system: S1 & S2 heard, RRR. No JVD, murmurs, rubs, gallops or clicks. No pedal edema. Gastrointestinal system: Abdomen is nondistended, soft and nontender. No organomegaly or masses felt. Normal bowel sounds heard. Central nervous system: Alert and oriented. No focal neurological deficits. Skin: No rashes, lesions or ulcers.   Data Reviewed: I have personally reviewed following labs and imaging studies  CBC: Recent Labs  Lab 11/10/20 1330 11/11/20 1732 11/12/20 0401 11/13/20 0338  WBC 6.4 10.5 9.0 9.8  NEUTROABS 5.2 9.2*  --  8.2*  HGB 12.5 11.0* 9.9* 8.0*  HCT 37.7 33.4* 28.4* 24.6*  MCV 85.3 85.9 84.0 85.1  PLT 245 230 213 186   Basic Metabolic Panel: Recent Labs  Lab 11/10/20 1330 11/11/20 1732 11/11/20 1733 11/12/20 0401 11/13/20 0338  NA 136 138  --  137 136  K 2.9* 3.4*  --  3.1* 4.0  CL 95* 98  --  98 98  CO2 30 28  --  26 26  GLUCOSE 117* 151*  --  140* 162*  BUN 7* 8  --  11 15  CREATININE 0.99 0.76  --  0.84 0.99  CALCIUM 9.7 9.2  --  8.9 8.4*  MG  --   --  1.9  --  1.7   GFR: Estimated Creatinine Clearance: 27.3 mL/min (by C-G formula based on SCr of 0.99 mg/dL). Liver Function Tests: Recent Labs  Lab 11/10/20 1330  AST 19  ALT 12  ALKPHOS 73  BILITOT 0.7  PROT 7.3  ALBUMIN 3.9   No results for input(s): LIPASE, AMYLASE in the last 168 hours. No results for input(s): AMMONIA in the last 168 hours. Coagulation Profile: Recent Labs  Lab 11/11/20 1732  INR 1.1   Cardiac Enzymes: Recent Labs  Lab 11/11/20 1732  CKTOTAL 87   BNP (last 3 results) No results for input(s): PROBNP in the last 8760 hours. HbA1C: No results for input(s): HGBA1C in the last 72 hours. CBG: No results for input(s): GLUCAP in the last 168 hours. Lipid Profile: No results for input(s): CHOL, HDL, LDLCALC, TRIG, CHOLHDL, LDLDIRECT in the last 72 hours. Thyroid Function Tests: No results for input(s): TSH, T4TOTAL, FREET4, T3FREE, THYROIDAB  in the last 72 hours. Anemia Panel: No results for input(s): VITAMINB12, FOLATE, FERRITIN, TIBC, IRON, RETICCTPCT in the last 72 hours. Sepsis Labs: No results for input(s): PROCALCITON, LATICACIDVEN in the last 168 hours.  Recent Results (from the past 240 hour(s))  SARS CORONAVIRUS 2 (TAT 6-24 HRS) Nasopharyngeal Nasopharyngeal Swab     Status: None   Collection Time: 11/11/20  5:29 PM   Specimen: Nasopharyngeal Swab  Result Value Ref Range Status   SARS Coronavirus 2 NEGATIVE NEGATIVE Final    Comment: (NOTE)  SARS-CoV-2 target nucleic acids are NOT DETECTED.  The SARS-CoV-2 RNA is generally detectable in upper and lower respiratory specimens during the acute phase of infection. Negative results do not preclude SARS-CoV-2 infection, do not rule out co-infections with other pathogens, and should not be used as the sole basis for treatment or other patient management decisions. Negative results must be combined with clinical observations, patient history, and epidemiological information. The expected result is Negative.  Fact Sheet for Patients: HairSlick.no  Fact Sheet for Healthcare Providers: quierodirigir.com  This test is not yet approved or cleared by the [place] FDA and  has been authorized for detection and/or diagnosis of SARS-CoV-2 by FDA under an Emergency Use Authorization (EUA). This EUA will remain  in effect (meaning this test can be used) for the duration of the COVID-19 declaration under Se ction 564(b)(1) of the Act, 21 U.S.C. section 360bbb-3(b)(1), unless the authorization is terminated or revoked sooner.  Performed at [facility], [address], [place], [place] [address]   Surgical pcr screen     Status: None   Collection Time: 11/12/20 12:38 PM   Specimen: Nasal Mucosa; Nasal Swab  Result Value Ref Range Status   MRSA, PCR NEGATIVE NEGATIVE Final   Staphylococcus aureus NEGATIVE  NEGATIVE Final    Comment: (NOTE) The Xpert SA Assay (FDA approved for NASAL specimens in patients 75 years of age and older), is one component of a comprehensive surveillance program. It is not intended to diagnose infection nor to guide or monitor treatment. Performed at [facility], [address], [place], [place] [address]       Radiology Studies: DG Chest 1 View  Result Date: 11/11/2020 CLINICAL DATA:  Post fall with right hip fracture. EXAM: CHEST  1 VIEW COMPARISON:  Radiograph 07/28/2020 FINDINGS: Patient is rotated. The heart is normal in size. There is aortic atherosclerosis and tortuosity, tortuosity exaggerated by rotation. Left lung apex not entirely included in the field of view. No pneumothorax or focal airspace disease. No pleural fluid. Bones under mineralized. Scoliotic curvature of the upper lumbar spine. No acute osseous abnormalities are seen. IMPRESSION: 1. No acute abnormality. 2. Aortic atherosclerosis and tortuosity, tortuosity exaggerated by rotation. Aortic Atherosclerosis (ICD10-I70.0). Electronically Signed   By: Narda Rutherford M.D.   On: 11/11/2020 16:07   DG Knee 2 Views Right  Result Date: 11/11/2020 CLINICAL DATA:  Fall.  Hip fracture EXAM: RIGHT KNEE - 1-2 VIEW COMPARISON:  None FINDINGS: Normal alignment no fracture. Moderate degenerative change in the lateral joint space with mild joint space narrowing and spurring. No effusion. IMPRESSION: Negative for fracture. Electronically Signed   By: Marlan Palau M.D.   On: 11/11/2020 18:14   DG C-Arm 1-60 Min  Result Date: 11/12/2020 CLINICAL DATA:  Surgery, elective. Additional history provided: Intramedullary (IM) nail femoral right. Provided fluoroscopy time 1 minutes, 14 seconds (9.5 mGy). EXAM: RIGHT FEMUR 2 VIEWS; DG C-ARM 1-60 MIN COMPARISON:  Radiographs of the hips 11/11/2020. FINDINGS: Patient with known acute right intratrochanteric hip fracture. Eight intraoperative fluoroscopic images of the  right hip and femur are submitted. The images demonstrate an intramedullary nail within the femur with proximal and distal interlocking screws. There is essentially anatomic alignment. No unexpected finding. IMPRESSION: Eight intraoperative fluoroscopic images of the right hip and femur, as described. Electronically Signed   By: Jackey Loge DO   On: 11/12/2020 15:25   DG Hip Unilat  With Pelvis 2-3 Views Right  Result Date: 11/11/2020 CLINICAL  DATA:  Post fall with right hip pain. EXAM: DG HIP (WITH OR WITHOUT PELVIS) 2-3V RIGHT COMPARISON:  Radiograph 10/25/2018 FINDINGS: Comminuted displaced intertrochanteric right proximal femur fracture. There is displacement of the lesser and greater trochanters and mild proximal migration of the femoral shaft. Femoral head remains seated. No additional fracture of the pelvis. Pubic rami are intact. Bones are under mineralized. Pubic symphysis and sacroiliac joints are congruent. Advanced vascular calcifications. IMPRESSION: Comminuted displaced intertrochanteric right proximal femur fracture. Electronically Signed   By: Narda Rutherford M.D.   On: 11/11/2020 16:05   DG FEMUR, MIN 2 VIEWS RIGHT  Result Date: 11/12/2020 CLINICAL DATA:  Surgery, elective. Additional history provided: Intramedullary (IM) nail femoral right. Provided fluoroscopy time 1 minutes, 14 seconds (9.5 mGy). EXAM: RIGHT FEMUR 2 VIEWS; DG C-ARM 1-60 MIN COMPARISON:  Radiographs of the hips 11/11/2020. FINDINGS: Patient with known acute right intratrochanteric hip fracture. Eight intraoperative fluoroscopic images of the right hip and femur are submitted. The images demonstrate an intramedullary nail within the femur with proximal and distal interlocking screws. There is essentially anatomic alignment. No unexpected finding. IMPRESSION: Eight intraoperative fluoroscopic images of the right hip and femur, as described. Electronically Signed   By: Jackey Loge DO   On: 11/12/2020 15:25   DG FEMUR PORT,  MIN 2 VIEWS RIGHT  Result Date: 11/12/2020 CLINICAL DATA:  Status post right hip ORIF EXAM: RIGHT FEMUR PORTABLE 2 VIEW COMPARISON:  11/11/2020 FINDINGS: Interval right hip ORIF with a a long intramedullary rod with single distal interlocking screw. Fracture fragments are in anatomic alignment. The femoral head is well seated within the right acetabulum. Avulsion of the lesser trochanter is again identified. No unexpected fracture or dislocation. Moderate bicompartmental degenerative arthritis of the right knee is incidentally noted. Trace right knee effusion. Advanced vascular calcifications are seen within the right thigh. IMPRESSION: Interval right hip ORIF. Normal alignment. Avulsion of the lesser trochanter noted. Electronically Signed   By: Helyn Numbers MD   On: 11/12/2020 16:14    Scheduled Meds: . acetaminophen  650 mg Oral Q8H  . diltiazem  240 mg Oral QPM  . docusate sodium  100 mg Oral BID  . enoxaparin (LOVENOX) injection  40 mg Subcutaneous Q24H  . feeding supplement  237 mL Oral BID BM  . fentaNYL  1 patch Transdermal QODAY  . Lifitegrast  1 drop Both Eyes BID  . mirtazapine  15 mg Oral QHS  . multivitamin with minerals  1 tablet Oral Daily  . ondansetron (ZOFRAN) IV  4 mg Intravenous Once  . simvastatin  20 mg Oral QPM  . triamterene-hydrochlorothiazide  1 tablet Oral Daily   Continuous Infusions: . lactated ringers 50 mL/hr at 11/12/20 1819     LOS: 2 days   Time spent: 28 minutes   Hughie Closs, MD [facility]  11/13/2020, 11:59 AM   To contact the attending provider between 7A-7P or the covering provider during after hours 7P-7A, please log into the web site [email or web address].

## 2020-11-13 NOTE — Evaluation (Signed)
Clinical/Bedside Swallow Evaluation Patient Details  Name: Claudia [name] MRN: [id number] Date of Birth: 1931-02-10  Today's Date: 11/13/2020 Time: SLP Start Time (ACUTE ONLY): 1135 SLP Stop Time (ACUTE ONLY): 1200 SLP Time Calculation (min) (ACUTE ONLY): 25 min  Past Medical History:  Past Medical History:  Diagnosis Date  . Back pain   . Cataracts, both eyes   . CHF (congestive heart failure) (HCC)   . Chronic narcotic use   . Chronic pain   . Coronary artery disease   . Hyperlipidemia   . Hypertension   . Mitral valve prolapse   . Osteoarthritis   . Osteoporosis   . Pain management   . Reflux    Past Surgical History:  Past Surgical History:  Procedure Laterality Date  . ABDOMINAL HYSTERECTOMY    . APPENDECTOMY    . BLADDER SURGERY    . BREAST SURGERY    . SHOULDER SURGERY     Right   HPI:  87 y.o. female with medical history significant for HTN, chronic back pain with chronic narcotic use who presents by EMS after a fall at home and unable to stand or bear weight after the fall. She was seen yesterday in ER for confusion and discharged home with recommendation for follow up with neurology for evaluation of dementia. This morning she was found on the floor by her daughter whom she lives with.  No family is currently at bedside. Attempted to call daughter but no answer. Pt unable to provide history and minimally responds verbally to questions. Reportedly she was walking in house when she tripped and fell. She was not able to stand up or bear weight. Cried out if right hip touched or right leg moved. Per chart pt uses rollator for assistance with ambulation but unclear if she was using at time of fall. 11/11/20 CXR indicated No acute abnormality  Assessment / Plan / Recommendation Clinical Impression  Pt seen for clinical swallowing evaluation with various consistencies presented and a cognitive-based oropharyngeal dysphagia noted, characterized by oral holding, reduced lingual  movement/coordination secondary to generalized weakness noted on OME.  Pt also exhibited a delay in the initiation of the swallow throughout evaluation which may be d/t presbyphagia (aging swallow), but can certainly be exacerbated by cognitive changes.  Pt with multiple swallows with larger boluses of thin via straw, so smaller sips recommended and slow rate/remove distractions from room when pt is eating/drinking with intermittent supervision during mealtime/snacks for compensatory strategies.  Recommend initiating a regular/thin liquid diet with swallowing precautions in place.  ST will f/u for diet tolerance/education for caregivers/pt during acute stay.  Thank you for this consult. SLP Visit Diagnosis: Dysphagia, unspecified (R13.10)    Aspiration Risk  Mild aspiration risk    Diet Recommendation   Regular/thin liquids (small sips)  Medication Administration: Whole meds with puree    Other  Recommendations Oral Care Recommendations: Oral care BID   Follow up Recommendations  (TBD)      Frequency and Duration min 1 x/week  1 week       Prognosis Prognosis for Safe Diet Advancement: Good      Swallow Study   General Date of Onset: 11/11/20 HPI: 87 y.o. female with medical history significant for HTN, chronic back pain with chronic narcotic use who presents by EMS after a fall at home and unable to stand or bear weight after the fall. She was seen yesterday in ER for confusion and discharged home with recommendation for follow up with  neurology for evaluation of dementia. This morning she was found on the floor by her daughter whom she lives with.  No family is currently at bedside. Attempted to call daughter but no answer. Pt unable to provide history and minimally responds verbally to questions. Reportedly she was walking in house when she tripped and fell. She was not able to stand up or bear weight. Cried out if right hip touched or right leg moved. Per chart pt uses rollator for  assistance with ambulation but unclear if she was using at time of fall. Type of Study: Bedside Swallow Evaluation Previous Swallow Assessment: n/a Diet Prior to this Study: NPO Temperature Spikes Noted: No Respiratory Status: Room air History of Recent Intubation:  (for surgery only) Behavior/Cognition: Alert;Confused;Requires cueing Oral Cavity Assessment: Within Functional Limits Oral Care Completed by SLP: Other (Comment) (Pt completed with min A from SLP for set-up) Oral Cavity - Dentition: Adequate natural dentition Vision: Functional for self-feeding Self-Feeding Abilities: Able to feed self;Needs assist Patient Positioning: Upright in bed;Other (comment) (as high as tolerated d/t pain) Baseline Vocal Quality: Low vocal intensity Volitional Cough: Strong Volitional Swallow: Able to elicit    Oral/Motor/Sensory Function Overall Oral Motor/Sensory Function: Generalized oral weakness Lingual ROM: Reduced right;Reduced left Lingual Strength: Reduced   Ice Chips Ice chips: Impaired Presentation: Spoon Oral Phase Impairments: Reduced lingual movement/coordination Oral Phase Functional Implications: Oral holding Pharyngeal Phase Impairments: Suspected delayed Swallow   Thin Liquid Thin Liquid: Impaired Presentation: Cup;Straw Oral Phase Functional Implications: Oral holding Pharyngeal  Phase Impairments: Suspected delayed Swallow;Multiple swallows    Nectar Thick Nectar Thick Liquid: Not tested   Honey Thick Honey Thick Liquid: Not tested   Puree Puree: Impaired Presentation: Spoon Pharyngeal Phase Impairments: Suspected delayed Swallow   Solid     Solid: Impaired Presentation: Self Fed Oral Phase Impairments: Reduced lingual movement/coordination Oral Phase Functional Implications: Oral holding Pharyngeal Phase Impairments: Suspected delayed Swallow      [name], M.S., CCC-SLP 11/13/2020,1:31 PM

## 2020-11-13 NOTE — Evaluation (Signed)
Physical Therapy Evaluation Patient Details Name: Claudia [name] MRN: [id number] DOB: [birth year] Today's Date: 11/13/2020   History of Present Illness  87 y.o. female with medical history significant for HTN, chronic back pain with chronic narcotic use who presents by EMS after a fall at home and unable to stand or bear weight after the fall. She was seen yesterday in ER for confusion and discharged home with recommendation for follow up with neurology for evaluation of dementia. Patient sustained R hip fracture. Patient now s/p IM nail R femur on 2/4.  Clinical Impression  PTA, patient was living at home with disabled daughter who she cares for. Patient reports she needed assistance with bathing and dress and daughter's aide would assist her also while she was in the home. Patient uses [employer] delivery for food assistance. Patient presents with generalized weakness, impaired balance, decreased activity tolerance, and impaired functional mobility. Patient overall min-modA for mobility with RW. Patient will benefit from skilled PT services during acute stay to address listed deficits. Recommend SNF following discharge to maximize functional mobility and safety.     Follow Up Recommendations SNF;Supervision/Assistance - 24 hour    Equipment Recommendations  Rolling Talton Delpriore with 5" wheels;3in1 (PT)    Recommendations for Other Services       Precautions / Restrictions Precautions Precautions: Fall Restrictions Weight Bearing Restrictions: Yes RLE Weight Bearing: Weight bearing as tolerated      Mobility  Bed Mobility Overal bed mobility: Needs Assistance Bed Mobility: Supine to Sit;Sit to Supine     Supine to sit: Min assist Sit to supine: Mod assist   General bed mobility comments: minA for advancement of R LE to EOB and repositioning of hips. ModA to return to supine    Transfers Overall transfer level: Needs assistance Equipment used: Rolling Briah Nary (2  wheeled) Transfers: Sit to/from Stand Sit to Stand: Mod assist         General transfer comment: modA for boost up, cues for hand placement  Ambulation/Gait Ambulation/Gait assistance: Min assist Gait Distance (Feet): 2 Feet Assistive device: Rolling Hashem Goynes (2 wheeled) Gait Pattern/deviations: Step-to pattern Gait velocity: decreased   General Gait Details: Sidesteps towards [facility] however slides feets across floor due to pain and weakness. Assist for balance and RW management  Stairs            Wheelchair Mobility    Modified Rankin (Stroke Patients Only)       Balance Overall balance assessment: Needs assistance Sitting-balance support: No upper extremity supported;Feet supported Sitting balance-Leahy Scale: Fair     Standing balance support: Bilateral upper extremity supported;During functional activity Standing balance-Leahy Scale: Poor Standing balance comment: reliant on UE support and external assist                             Pertinent Vitals/Pain Pain Assessment: Faces Faces Pain Scale: Hurts even more Pain Location: R hip Pain Descriptors / Indicators: Grimacing;Sore Pain Intervention(s): Monitored during session;Limited activity within patient's tolerance;Repositioned    Home Living Family/patient expects to be discharged to:: Private residence     Type of Home: House Home Access: Level entry     Home Layout: One level Home Equipment: Bedside commode;Dareld Mcauliffe - 4 wheels;Cane - single point;Shower seat Additional Comments: ambulated with rollator per daughter    Prior Function Level of Independence: Needs assistance   Gait / Transfers Assistance Needed: ambulates with rollator  ADL's / Homemaking Assistance Needed: Her adopted  daughter's aide would assist the patient with bathing and dressing while she was there  Comments: states she has been using Me[employer] on Whe[employer] for meals. Patient states daughter comes by to check on her a  couple times a week     Hand Dominance        Extremity/Trunk Assessment   Upper Extremity Assessment Upper Extremity Assessment: Defer to OT evaluation    Lower Extremity Assessment Lower Extremity Assessment: Generalized weakness;RLE deficits/detail RLE: Unable to fully assess due to pain    Cervical / Trunk Assessment Cervical / Trunk Assessment: Kyphotic  Communication   Communication: HOH  Cognition Arousal/Alertness: Awake/alert Behavior During Therapy: WFL for tasks assessed/performed Overall Cognitive Status: Impaired/Different from baseline Area of Impairment: Memory;Following commands;Safety/judgement;Problem solving                     Memory: Decreased short-term memory Following Commands: Follows one step commands inconsistently;Follows one step commands with increased time Safety/Judgement: Decreased awareness of safety;Decreased awareness of deficits   Problem Solving: Slow processing;Difficulty sequencing;Requires verbal cues;Requires tactile cues General Comments: A&Ox4      General Comments      Exercises     Assessment/Plan    PT Assessment Patient needs continued PT services  PT Problem List Decreased strength;Decreased range of motion;Decreased activity tolerance;Decreased balance;Decreased mobility;Decreased cognition;Decreased safety awareness       PT Treatment Interventions DME instruction;Gait training;Functional mobility training;Therapeutic activities;Therapeutic exercise;Balance training;Patient/family education    PT Goals (Current goals can be found in the [name] Plan section)  Acute Rehab PT Goals Patient Stated Goal: to go home PT Goal Formulation: With patient Time For Goal Achievement: 11/27/20 Potential to Achieve Goals: Fair    Frequency Min 3X/week   Barriers to discharge        Co-evaluation               AM-PAC PT "6 Clicks" Mobility  Outcome Measure Help needed turning from your back to your side  while in a flat bed without using bedrails?: A Little Help needed moving from lying on your back to sitting on the side of a flat bed without using bedrails?: A Little Help needed moving to and from a bed to a chair (including a wheelchair)?: A Little Help needed standing up from a chair using your arms (e.g., wheelchair or bedside chair)?: A Lot Help needed to walk in hospital room?: A Lot Help needed climbing 3-5 steps with a railing? : A Lot 6 Click Score: 15    End of Session Equipment Utilized During Treatment: Gait belt Activity Tolerance: Patient limited by pain Patient left: in bed;with call bell/phone within reach;with bed alarm set Nurse Communication: Mobility status PT Visit Diagnosis: Unsteadiness on feet (R26.81);Muscle weakness (generalized) (M62.81);History of falling (Z91.81);Difficulty in walking, not elsewhere classified (R26.2)    Time: 5885-0277 PT Time Calculation (min) (ACUTE ONLY): 23 min   Charges:   PT Evaluation $PT Eval Low Complexity: 1 Low PT Treatments $Therapeutic Activity: 8-22 mins        Surafel Hilleary A. Dan Humphreys PT, DPT Acute Rehabilitation Services Pager [phone] Office [phone]   Elissa Lovett 11/13/2020, 1:23 PM

## 2020-11-14 DIAGNOSIS — S72141A Displaced intertrochanteric fracture of right femur, initial encounter for closed fracture: Secondary | ICD-10-CM | POA: Diagnosis not present

## 2020-11-14 LAB — CBC
HCT: 18.2 % — ABNORMAL LOW (ref 36.0–46.0)
Hemoglobin: 6.2 g/dL — CL (ref 12.0–15.0)
MCH: 29.2 pg (ref 26.0–34.0)
MCHC: 34.1 g/dL (ref 30.0–36.0)
MCV: 85.8 fL (ref 80.0–100.0)
Platelets: 157 10*3/uL (ref 150–400)
RBC: 2.12 MIL/uL — ABNORMAL LOW (ref 3.87–5.11)
RDW: 13.9 % (ref 11.5–15.5)
WBC: 9.9 10*3/uL (ref 4.0–10.5)
nRBC: 0 % (ref 0.0–0.2)

## 2020-11-14 LAB — BASIC METABOLIC PANEL
Anion gap: 8 (ref 5–15)
BUN: 23 mg/dL (ref 8–23)
CO2: 28 mmol/L (ref 22–32)
Calcium: 7.8 mg/dL — ABNORMAL LOW (ref 8.9–10.3)
Chloride: 100 mmol/L (ref 98–111)
Creatinine, Ser: 0.94 mg/dL (ref 0.44–1.00)
GFR, Estimated: 59 mL/min — ABNORMAL LOW (ref >60)
Glucose, Bld: 131 mg/dL — ABNORMAL HIGH (ref 70–99)
Potassium: 3.5 mmol/L (ref 3.5–5.1)
Sodium: 136 mmol/L (ref 135–145)

## 2020-11-14 LAB — PREPARE RBC (CROSSMATCH)

## 2020-11-14 LAB — HEMOGLOBIN AND HEMATOCRIT, BLOOD
HCT: 24.7 % — ABNORMAL LOW (ref 36.0–46.0)
Hemoglobin: 8.5 g/dL — ABNORMAL LOW (ref 12.0–15.0)

## 2020-11-14 MED ORDER — LACTATED RINGERS IV BOLUS
500.0000 mL | Freq: Once | INTRAVENOUS | Status: AC
  Administered 2020-11-14: 500 mL via INTRAVENOUS

## 2020-11-14 MED ORDER — SODIUM CHLORIDE 0.9% IV SOLUTION: Freq: Once | INTRAVENOUS | Status: AC

## 2020-11-14 MED ORDER — CHLORHEXIDINE GLUCONATE CLOTH 2 % EX PADS
6.0000 | MEDICATED_PAD | Freq: Every day | CUTANEOUS | Status: DC
  Administered 2020-11-15 – 2020-11-19 (×5): 6 via TOPICAL

## 2020-11-14 NOTE — Progress Notes (Addendum)
PROGRESS NOTE    Claudia [name]  XLK:[id number] DOB: 1931-05-01 DOA: 11/11/2020 PCP: [name], MD   Brief Narrative:  Claudia [name] is a 87 y.o. female with medical history significant for HTN, chronic back pain with chronic narcotic use who presented by EMS after a fall at home and unable to stand or bear weight after the fall. She was seen a day prior in ER for confusion and discharged home with recommendation for follow up with neurology for evaluation of dementia. This morning she was found on the floor by her daughter whom she lives with.  Upon presentation to ED, she was hemodynamically stable however Found to have right hip fracture on xray.  Admitted to hospitalist service and orthopedics consulted.  Assessment & Plan:   Principal Problem:   Closed comminuted intertrochanteric fracture of proximal femur, right, initial encounter [facility]) Active Problems:   Chronic back pain   Hypertension   Fall at home, initial encounter   Closed right hip fracture (HCC)  Closed comminuted right intertrochanteric fracture of proximal femur: Orthopedics on board.  Postoperative day 2 status post intramedullary nailing of the right hip.  Pain controlled.  Management per orthopedics.  PT OT recommends SNF.  Acute blood loss anemia: Presented with hemoglobin over 12 which dropped to 7.1 last night post surgery.  1 unit of PRBC transfusion was ordered by night hospitalist.  Posttransfusion hemoglobin 8.5.  No source of bleeding identified.  Incision at the right hip also looks clean.  Discussed with orthopedics and will hold Lovenox for today and place on SCD for now.  Monitor overnight.  Acute urinary retention: Patient has been having retention.  Has required I/O x2 in last 24 hours and currently has retention third time with bladder scan> 600 ml.  Will insert Foley catheter.  Essential hypertension: Controlled.  Continue diltiazem.  Chronic back pain: Fentanyl patch.  Fall leading to  fracture: PT OT recommends SNF.  Hyperlipidemia: Continue statin  DVT prophylaxis: Place and maintain sequential compression device Start: 11/14/20 1050 SCDs Start: 11/12/20 1755 SCDs Start: 11/11/20 1954   Code Status: Full Code  Family Communication:  None present at bedside.  Plan of care discussed with patient in length and he verbalized understanding and agreed with it.  Status is: Inpatient  Remains inpatient appropriate because:Inpatient level of care appropriate due to severity of illness   Dispo: The patient is from: Home              Anticipated d/c is to: SNF              Anticipated d/c date is: 1 to 2 days.              Patient currently is not medically stable to d/c.   Difficult to place patient No        Estimated body mass index is 20.2 kg/m as calculated from the following:   Height as of 11/10/20: 4\' 11"  (1.499 m).   Weight as of 11/10/20: 45.4 kg.      Nutritional status:  Nutrition Problem: Increased nutrient needs Etiology: post-op healing   Signs/Symptoms: estimated needs   Interventions: Ensure Enlive (each supplement provides 350kcal and 20 grams of protein),MVI    Consultants:   Orthopedics  Procedures:   As above  Antimicrobials:  Anti-infectives (From admission, onward)   Start     Dose/Rate Route Frequency Ordered Stop   11/12/20 1306  ceFAZolin (ANCEF) 2-4 GM/100ML-% IVPB  Note to Pharmacy: Shiela Mayer, Tammy   : cabinet override      11/12/20 1306 11/13/20 0114   11/11/20 2045  cefTRIAXone (ROCEPHIN) 2 g in sodium chloride 0.9 % 100 mL IVPB        2 g 200 mL/hr over 30 Minutes Intravenous  Once 11/11/20 2041 11/11/20 2319         Subjective: Seen and examined.  Sitting up in the bed trying to eat her breakfast.  Denies any complaint.  Fully alert and oriented.  Objective: Vitals:   11/14/20 0232 11/14/20 0323 11/14/20 0348 11/14/20 0607  BP: (!) 98/50 (!) 110/47 (!) 103/46 (!) 101/55  Pulse: 73 72 69 69  Resp:  15 15 15 15   Temp:  98.2 F (36.8 C) 98.4 F (36.9 C) 98.3 F (36.8 C)  TempSrc:  Oral Oral Oral  SpO2: 94% 95% 93% 97%    Intake/Output Summary (Last 24 hours) at 11/14/2020 1049 Last data filed at 11/14/2020 0640 Gross per 24 hour  Intake 1090 ml  Output 1150 ml  Net -60 ml   There were no vitals filed for this visit.  Examination:  General exam: Appears calm and comfortable  Respiratory system: Clear to auscultation. Respiratory effort normal. Cardiovascular system: S1 & S2 heard, RRR. No JVD, murmurs, rubs, gallops or clicks. No pedal edema. Gastrointestinal system: Abdomen is nondistended, soft and nontender. No organomegaly or masses felt. Normal bowel sounds heard. Central nervous system: Alert and oriented. No focal neurological deficits. Skin: No rashes, lesions or ulcers.  Psychiatry: Judgement and insight appear normal. Mood & affect appropriate.    Data Reviewed: I have personally reviewed following labs and imaging studies  CBC: Recent Labs  Lab 11/10/20 1330 11/11/20 1732 11/12/20 0401 11/13/20 0338 11/13/20 1721 11/14/20 0127 11/14/20 0845  WBC 6.4 10.5 9.0 9.8 11.6* 9.9  --   NEUTROABS 5.2 9.2*  --  8.2* 9.4*  --   --   HGB 12.5 11.0* 9.9* 8.0* 7.1* 6.2* 8.5*  HCT 37.7 33.4* 28.4* 24.6* 21.6* 18.2* 24.7*  MCV 85.3 85.9 84.0 85.1 86.4 85.8  --   PLT 245 230 213 186 190 157  --    Basic Metabolic Panel: Recent Labs  Lab 11/10/20 1330 11/11/20 1732 11/11/20 1733 11/12/20 0401 11/13/20 0338 11/14/20 0127  NA 136 138  --  137 136 136  K 2.9* 3.4*  --  3.1* 4.0 3.5  CL 95* 98  --  98 98 100  CO2 30 28  --  26 26 28   GLUCOSE 117* 151*  --  140* 162* 131*  BUN 7* 8  --  11 15 23   CREATININE 0.99 0.76  --  0.84 0.99 0.94  CALCIUM 9.7 9.2  --  8.9 8.4* 7.8*  MG  --   --  1.9  --  1.7  --    GFR: Estimated Creatinine Clearance: 28.8 mL/min (by C-G formula based on SCr of 0.94 mg/dL). Liver Function Tests: Recent Labs  Lab 11/10/20 1330  AST 19   ALT 12  ALKPHOS 73  BILITOT 0.7  PROT 7.3  ALBUMIN 3.9   No results for input(s): LIPASE, AMYLASE in the last 168 hours. No results for input(s): AMMONIA in the last 168 hours. Coagulation Profile: Recent Labs  Lab 11/11/20 1732  INR 1.1   Cardiac Enzymes: Recent Labs  Lab 11/11/20 1732  CKTOTAL 87   BNP (last 3 results) No results for input(s): PROBNP in the last 8760 hours. HbA1C:  No results for input(s): HGBA1C in the last 72 hours. CBG: No results for input(s): GLUCAP in the last 168 hours. Lipid Profile: No results for input(s): CHOL, HDL, LDLCALC, TRIG, CHOLHDL, LDLDIRECT in the last 72 hours. Thyroid Function Tests: No results for input(s): TSH, T4TOTAL, FREET4, T3FREE, THYROIDAB in the last 72 hours. Anemia Panel: No results for input(s): VITAMINB12, FOLATE, FERRITIN, TIBC, IRON, RETICCTPCT in the last 72 hours. Sepsis Labs: No results for input(s): PROCALCITON, LATICACIDVEN in the last 168 hours.  Recent Results (from the past 240 hour(s))  SARS CORONAVIRUS 2 (TAT 6-24 HRS) Nasopharyngeal Nasopharyngeal Swab     Status: None   Collection Time: 11/11/20  5:29 PM   Specimen: Nasopharyngeal Swab  Result Value Ref Range Status   SARS Coronavirus 2 NEGATIVE NEGATIVE Final    Comment: (NOTE) SARS-CoV-2 target nucleic acids are NOT DETECTED.  The SARS-CoV-2 RNA is generally detectable in upper and lower respiratory specimens during the acute phase of infection. Negative results do not preclude SARS-CoV-2 infection, do not rule out co-infections with other pathogens, and should not be used as the sole basis for treatment or other patient management decisions. Negative results must be combined with clinical observations, patient history, and epidemiological information. The expected result is Negative.  Fact Sheet for Patients: HairSlick.no  Fact Sheet for Healthcare Providers: quierodirigir.com  This  test is not yet approved or cleared by the [place] FDA and  has been authorized for detection and/or diagnosis of SARS-CoV-2 by FDA under an Emergency Use Authorization (EUA). This EUA will remain  in effect (meaning this test can be used) for the duration of the COVID-19 declaration under Se ction 564(b)(1) of the Act, 21 U.S.C. section 360bbb-3(b)(1), unless the authorization is terminated or revoked sooner.  Performed at [facility], [address] [place], [place] [address]   Surgical pcr screen     Status: None   Collection Time: 11/12/20 12:38 PM   Specimen: Nasal Mucosa; Nasal Swab  Result Value Ref Range Status   MRSA, PCR NEGATIVE NEGATIVE Final   Staphylococcus aureus NEGATIVE NEGATIVE Final    Comment: (NOTE) The Xpert SA Assay (FDA approved for NASAL specimens in patients 59 years of age and older), is one component of a comprehensive surveillance program. It is not intended to diagnose infection nor to guide or monitor treatment. Performed at [facility], [address], [place], [place] [address]       Radiology Studies: DG C-Arm 1-60 Min  Result Date: 11/12/2020 CLINICAL DATA:  Surgery, elective. Additional history provided: Intramedullary (IM) nail femoral right. Provided fluoroscopy time 1 minutes, 14 seconds (9.5 mGy). EXAM: RIGHT FEMUR 2 VIEWS; DG C-ARM 1-60 MIN COMPARISON:  Radiographs of the hips 11/11/2020. FINDINGS: Patient with known acute right intratrochanteric hip fracture. Eight intraoperative fluoroscopic images of the right hip and femur are submitted. The images demonstrate an intramedullary nail within the femur with proximal and distal interlocking screws. There is essentially anatomic alignment. No unexpected finding. IMPRESSION: Eight intraoperative fluoroscopic images of the right hip and femur, as described. Electronically Signed   By: Jackey Loge DO   On: 11/12/2020 15:25   DG FEMUR, MIN 2 VIEWS RIGHT  Result Date:  11/12/2020 CLINICAL DATA:  Surgery, elective. Additional history provided: Intramedullary (IM) nail femoral right. Provided fluoroscopy time 1 minutes, 14 seconds (9.5 mGy). EXAM: RIGHT FEMUR 2 VIEWS; DG C-ARM 1-60 MIN COMPARISON:  Radiographs of the hips 11/11/2020. FINDINGS: Patient with known acute right intratrochanteric  hip fracture. Eight intraoperative fluoroscopic images of the right hip and femur are submitted. The images demonstrate an intramedullary nail within the femur with proximal and distal interlocking screws. There is essentially anatomic alignment. No unexpected finding. IMPRESSION: Eight intraoperative fluoroscopic images of the right hip and femur, as described. Electronically Signed   By: Jackey Loge DO   On: 11/12/2020 15:25   DG FEMUR PORT, MIN 2 VIEWS RIGHT  Result Date: 11/12/2020 CLINICAL DATA:  Status post right hip ORIF EXAM: RIGHT FEMUR PORTABLE 2 VIEW COMPARISON:  11/11/2020 FINDINGS: Interval right hip ORIF with a a long intramedullary rod with single distal interlocking screw. Fracture fragments are in anatomic alignment. The femoral head is well seated within the right acetabulum. Avulsion of the lesser trochanter is again identified. No unexpected fracture or dislocation. Moderate bicompartmental degenerative arthritis of the right knee is incidentally noted. Trace right knee effusion. Advanced vascular calcifications are seen within the right thigh. IMPRESSION: Interval right hip ORIF. Normal alignment. Avulsion of the lesser trochanter noted. Electronically Signed   By: Helyn Numbers MD   On: 11/12/2020 16:14    Scheduled Meds: . acetaminophen  650 mg Oral Q8H  . diltiazem  240 mg Oral QPM  . docusate sodium  100 mg Oral BID  . feeding supplement  237 mL Oral BID BM  . fentaNYL  1 patch Transdermal QODAY  . Lifitegrast  1 drop Both Eyes BID  . mirtazapine  15 mg Oral QHS  . multivitamin with minerals  1 tablet Oral Daily  . ondansetron (ZOFRAN) IV  4 mg  Intravenous Once  . simvastatin  20 mg Oral QPM  . triamterene-hydrochlorothiazide  1 tablet Oral Daily   Continuous Infusions: . lactated ringers 50 mL/hr at 11/14/20 0613     LOS: 3 days   Time spent: 27 minutes   Hughie Closs, MD [facility]  11/14/2020, 10:49 AM   To contact the attending provider between 7A-7P or the covering provider during after hours 7P-7A, please log into the web site [email or web address].

## 2020-11-14 NOTE — Progress Notes (Signed)
Pt stand/pivot up to chair for 1 hour today.  Pt received Foley d/t continued urinary retention. Tolerated procedure well.

## 2020-11-14 NOTE — Progress Notes (Signed)
SPORTS MEDICINE AND JOINT REPLACEMENT  Georgena Spurling, MD    Laurier Nancy, PA-C [address] [place], [place], [place]  [address]                             [phone]   PROGRESS NOTE  Subjective:  negative for Chest Pain  negative for Shortness of Breath  negative for Nausea/Vomiting   negative for Calf Pain  negative for Bowel Movement   Tolerating Diet: yes         Patient reports pain as 3 on 0-10 scale.    Objective: Vital signs in last 24 hours:   Patient Vitals for the past 24 hrs:  BP Temp Temp src Pulse Resp SpO2  11/14/20 0607 (!) 101/55 98.3 F (36.8 C) Oral 69 15 97 %  11/14/20 0348 (!) 103/46 98.4 F (36.9 C) Oral 69 15 93 %  11/14/20 0323 (!) 110/47 98.2 F (36.8 C) Oral 72 15 95 %  11/14/20 0232 (!) 98/50 -- -- 73 15 94 %  11/13/20 2131 94/62 99.5 F (37.5 C) Oral 76 17 93 %  11/13/20 1826 (!) 108/55 -- -- 87 -- --  11/13/20 1359 (!) 99/54 98.7 F (37.1 C) Oral 88 15 98 %    @flow {1959:LAST@   Intake/Output from previous day:   02/05 0701 - 02/06 0700 In: 1090 [P.O.:240; I.V.:350] Out: 1150 [Urine:1150]   Intake/Output this shift:   No intake/output data recorded.   Intake/Output      02/05 0701 02/06 0700 02/06 0701 02/07 0700   P.O. 240    I.V. 350    Other     IV Piggyback 500    Total Intake 1090    Urine 1150    Stool 0    Blood     Total Output 1150    Net -60         Stool Occurrence 2 x       LABORATORY DATA: Recent Labs    11/10/20 1330 11/11/20 1732 11/12/20 0401 11/13/20 0338 11/13/20 1721 11/14/20 0127  WBC 6.4 10.5 9.0 9.8 11.6* 9.9  HGB 12.5 11.0* 9.9* 8.0* 7.1* 6.2*  HCT 37.7 33.4* 28.4* 24.6* 21.6* 18.2*  PLT 245 230 213 186 190 157   Recent Labs    11/10/20 1330 11/11/20 1732 11/12/20 0401 11/13/20 0338 11/14/20 0127  NA 136 138 137 136 136  K 2.9* 3.4* 3.1* 4.0 3.5  CL 95* 98 98 98 100  CO2 30 28 26 26 28   BUN 7* 8 11 15 23   CREATININE 0.99 0.76 0.84 0.99 0.94  GLUCOSE 117* 151* 140* 162*  131*  CALCIUM 9.7 9.2 8.9 8.4* 7.8*   Lab Results  Component Value Date   INR 1.1 11/11/2020   INR 0.97 04/12/2010    Examination:  General appearance: alert, cooperative and no distress Extremities: extremities normal, atraumatic, no cyanosis or edema  Wound Exam: clean, dry, intact   Drainage:  Scant/small amount Serosanguinous exudate  Motor Exam: Quadriceps and Hamstrings Intact  Sensory Exam: Superficial Peroneal and Deep Peroneal normal   Assessment:    2 Days Post-Op  Procedure(s) (LRB): INTRAMEDULLARY (IM) NAIL FEMORAL (Right)  ADDITIONAL DIAGNOSIS:  Principal Problem:   Closed comminuted intertrochanteric fracture of proximal femur, right, initial encounter (HCC) Active Problems:   Chronic back pain   Hypertension   Fall at home, initial encounter   Closed right hip fracture (HCC)  Plan: Physical Therapy as ordered Weight Bearing as Tolerated (WBAT)  DVT Prophylaxis:  Lovenox  Patient resting comfortably in bed, much more comfortable today. PT eval yesterday recommending SNF placement. Will continue to follow  Guy Sandifer 11/14/2020, 7:08 AM

## 2020-11-14 NOTE — Progress Notes (Addendum)
Patient's HGB 6.2, notified E. Ouma,NP. New orders received and noted. Writer called daughter Camille Bal @0300  to get a phone consent, she agreed to proceed. VS BP 110/47 T98.2 PR72 RR15 02sat 95% RA.   1 pRBC transfusion started at 0330 with no transfusion reactions noted.   Will continue to close monitor patient.

## 2020-11-14 NOTE — Progress Notes (Signed)
Patient has no urine output >8hrs. Bladder scan showed , E. Ouma,NP made aware. New order received for I&O cath. amber colored urine noted after I&0 cath.   Will continue to close monitor patient.

## 2020-11-15 ENCOUNTER — Encounter (HOSPITAL_COMMUNITY): Payer: Self-pay | Admitting: Orthopedic Surgery

## 2020-11-15 DIAGNOSIS — S72141A Displaced intertrochanteric fracture of right femur, initial encounter for closed fracture: Secondary | ICD-10-CM | POA: Diagnosis not present

## 2020-11-15 LAB — BPAM RBC
Blood Product Expiration Date: 202203012359
ISSUE DATE / TIME: 202202060316
Unit Type and Rh: 6200

## 2020-11-15 LAB — CBC
HCT: 22.2 % — ABNORMAL LOW (ref 36.0–46.0)
Hemoglobin: 7.2 g/dL — ABNORMAL LOW (ref 12.0–15.0)
MCH: 27.8 pg (ref 26.0–34.0)
MCHC: 32.4 g/dL (ref 30.0–36.0)
MCV: 85.7 fL (ref 80.0–100.0)
Platelets: 162 10*3/uL (ref 150–400)
RBC: 2.59 MIL/uL — ABNORMAL LOW (ref 3.87–5.11)
RDW: 14.3 % (ref 11.5–15.5)
WBC: 6.9 10*3/uL (ref 4.0–10.5)
nRBC: 0 % (ref 0.0–0.2)

## 2020-11-15 LAB — TYPE AND SCREEN
ABO/RH(D): A POS
Antibody Screen: NEGATIVE
Unit division: 0

## 2020-11-15 LAB — BASIC METABOLIC PANEL
Anion gap: 9 (ref 5–15)
BUN: 12 mg/dL (ref 8–23)
CO2: 31 mmol/L (ref 22–32)
Calcium: 8 mg/dL — ABNORMAL LOW (ref 8.9–10.3)
Chloride: 98 mmol/L (ref 98–111)
Creatinine, Ser: 0.79 mg/dL (ref 0.44–1.00)
GFR, Estimated: >60 mL/min (ref >60)
Glucose, Bld: 109 mg/dL — ABNORMAL HIGH (ref 70–99)
Potassium: 3.5 mmol/L (ref 3.5–5.1)
Sodium: 138 mmol/L (ref 135–145)

## 2020-11-15 LAB — HEMOGLOBIN AND HEMATOCRIT, BLOOD
HCT: 24.5 % — ABNORMAL LOW (ref 36.0–46.0)
Hemoglobin: 7.7 g/dL — ABNORMAL LOW (ref 12.0–15.0)

## 2020-11-15 NOTE — Care Management Important Message (Signed)
Important Message  Patient Details  Name: Claudia [name] MRN: [id number] Date of Birth: 11/08/1930   Medicare Important Message Given:  Yes     Sora Olivo 11/15/2020, 3:22 PM

## 2020-11-15 NOTE — Consult Note (Signed)
Of note, Claudia [name]' is a 87 year old female who medical history significant for hypertension, chronic back pain with chronic narcotic use, who presented by EMS after a fall at home.  Patient was previously evaluated in the emergency room for confusion, and discharged home with neurology follow-up for possible dementia.  Patient was subsequently diagnosed with a right hip fracture, in which she had surgery on February 5.  Psychiatry was consulted for capacity evaluation.  Needs to go to SNF but she is refusing.  Daughter wants her to go to SNF which she is the right and safe being for patient.    On interview, Claudia [name] reports that she is receiving treatment for her fractured hip, and low blood.  "I broke my hip.  I went to put away the groceries from [employer] and I am not sure how I ended in the floor.  I stayed in the floor for God knows how long.  The doctor told her to watch me and she did not.  He told her that I could potentially fall.  "  She reports that she understands while she is in the hospital, however would like to go home.  She is adamant about not returning to a SNF, stating "it did not do me any good before.  I went there after I had a stroke.  I lost my glasses, and they said I did not have them there.  Now I cannot read because of my glasses are missing.  I do not understand why someone cannot come stay with me at my home.  "  Patient acknowledges she is unable to receive skilled nursing services in her home to the degree of a skilled nursing facility.   She is unable to identify the benefits and risks of treatment, or alternative approaches.  She continues to state "I just want to go home.  "  She is able to describe her current condition, the treatment recommended although she is unable to describe or provide the consequences associated with returning home with no skilled nursing services.  She has limited insight in regards to rationalizing relevant information to reach  appropriate decisions such as her multiple requests "I just want to go home.  "  When asked what would happen if she were to fall at home again she stated "I just want to go home.  I would rather die first.  "  She reports no previous improvement in her most recent SNF placement, she appears to be angered by the thought of returning to another facility even if it is temporary.   Patient states at this time she is refusing to go to another facility, and express interest in receiving services at home .  Patient does admit to passive suicidal ideations if she has to return to the facility.  She is able to state that she may become sick if she leaves and returns home without receiving proper treatment.  She does not appreciate the seriousness of her condition including increased risk for falls, deconditioning, need for multiple medical services and interventions in which she is unable to receive at home.    She is alert and oriented, calm and cooperative. She denies SI, HI or AVH. SHe denies a history of suicide attempts. SHe denies a history of mood symptoms.    # Capacity evaluation to to refuse SNF placement At the present time, the psychiatry consultation service believes the patient does not have decisional capacity with respect to declines  SNF placement. Criteria for decision making capacity requires patient be able to: (1) communicate a choice in a clear and consistent manner, (2) demonstrate adequate understanding of disclosed relevant information regarding her medical condition and treatment, possible benefits and risks of that treatment, and alternative approaches, (3) describe views of her medical condition, proposed treatment and its consequences, and (4) engage in a rational process of manipulating the relevant information to reach her decision. Based on our examination, the patient does not meet those criteria. It should be emphasized however, that capacity may need to be re-assessed, as the patient's  mental status changes over time. Also, decisional capacity for other, specific treatment/disposition decisions will need to be evaluated on an individual basis.   A substitute decision maker must be sought to authorize medical intervention or to refuse treatment on behalf of the patient; for patients with advance directives, either the treatment choice that the patient made in advance or the choice of a surrogate decision maker may be indicated. In the absence of an advance directive and when time is available, the recommendation is usually to contact family members (the priority order to be approached is the spouse, adult children, parents, siblings, and other relatives).  -Patient currently does not have appropriate capacity to refuse SNF placement. -Patient does appear to be open to receive inpatient rehabilitation, or skilled nursing services and home.  Consider inpatient rehabilitation services if patient agrees and or qualifies. -Patient currently makes suicidal statements " I would rather die first before going back there."  Will recommend palliative care consult, to establish goals of care at this time. She is able to contract for safety at this time, just expresses much dislike in returning to a facility.

## 2020-11-15 NOTE — Progress Notes (Addendum)
Physical Therapy Treatment Patient Details Name: Claudia [name] MRN: [id number] DOB: 1931/03/17 Today's Date: 11/15/2020    History of Present Illness 87 y.o. female presents to ED by EMS after fall. Pt sustained R hip fracture s/p IM nail R femur 2/4.   Prior to this admission presented to ED (11/10/20) for confusion and d/c home with recommended OP f/u with neuro for eval of dementia. PMH:HTN, chronic back pain with chronic narcotic use.    PT Comments    Pt supine in bed on arrival.  Pt verbally agitated and moving OOB to recliner.  She was less motivated and required increased assistance to move from surface to surface.  Continue to recommend rehab in a post acute setting.   Pt tolerated session well but due to self limiting behavior it was difficult to progress patient. Will continue efforts during acute stay.     Follow Up Recommendations  SNF;Supervision/Assistance - 24 hour     Equipment Recommendations  Rolling walker with 5" wheels;3in1 (PT)    Recommendations for Other Services       Precautions / Restrictions Precautions Precautions: Fall Restrictions Weight Bearing Restrictions: Yes RLE Weight Bearing: Weight bearing as tolerated    Mobility  Bed Mobility Overal bed mobility: Needs Assistance Bed Mobility: Supine to Sit;Sit to Supine     Supine to sit: Max assist;HOB elevated;+2 for safety/equipment;+2 for physical assistance     General bed mobility comments: pt in chair  Transfers Overall transfer level: Needs assistance Equipment used: Rolling walker (2 wheeled) Transfers: Sit to/from Stand Sit to Stand: Mod assist;+2 physical assistance Stand pivot transfers: +2 physical assistance;Max assist       General transfer comment: assist to rise and steady, cues for hand placement  Ambulation/Gait                 Stairs             Wheelchair Mobility    Modified Rankin (Stroke Patients Only)       Balance Overall balance assessment:  Needs assistance Sitting-balance support: No upper extremity supported;Feet supported Sitting balance-Leahy Scale: Fair     Standing balance support: Bilateral upper extremity supported;During functional activity Standing balance-Leahy Scale: Poor Standing balance comment: reliant on UE support and external assist                            Cognition Arousal/Alertness: Awake/alert Behavior During Therapy: WFL for tasks assessed/performed Overall Cognitive Status: Impaired/Different from baseline Area of Impairment: Memory;Following commands;Safety/judgement;Problem solving;Awareness                     Memory: Decreased short-term memory Following Commands: Follows one step commands inconsistently;Follows one step commands with increased time Safety/Judgement: Decreased awareness of safety;Decreased awareness of deficits Awareness: Emergent Problem Solving: Slow processing;Difficulty sequencing;Requires verbal cues;Requires tactile cues General Comments: pt with varying cooperation      Exercises General Exercises - Lower Extremity Ankle Circles/Pumps: Right (performed x 2 reps after reposition of R hip but refused further reps.) Long Arc Quad: AROM;Right;10 reps;Seated    General Comments        Pertinent Vitals/Pain Pain Assessment: Faces Faces Pain Scale: Hurts little more Pain Location: R hip Pain Descriptors / Indicators: Grimacing;Sore Pain Intervention(s): Monitored during session;Repositioned    Home Living Family/patient expects to be discharged to:: Private residence     Type of Home: House Home Access: Level entry   Home Layout:  One level Home Equipment: Bedside commode;Walker - 4 wheels;Cane - single point;Shower seat      Prior Function Level of Independence: Needs assistance  Gait / Transfers Assistance Needed: ambulates with rollator ADL's / Homemaking Assistance Needed: Her adopted daughter's aide would assist the patient with  bathing and dressing while she was there, had mobile meals Comments: pt with varying reports of who checks on her, daughter vs granddaughter   PT Goals (current goals can now be found in the care plan section) Acute Rehab PT Goals Patient Stated Goal: to go home Potential to Achieve Goals: Fair Progress towards PT goals: Progressing toward goals    Frequency    Min 3X/week      PT Plan Current plan remains appropriate    Co-evaluation              AM-PAC PT "6 Clicks" Mobility   Outcome Measure  Help needed turning from your back to your side while in a flat bed without using bedrails?: A Lot Help needed moving from lying on your back to sitting on the side of a flat bed without using bedrails?: A Lot Help needed moving to and from a bed to a chair (including a wheelchair)?: A Lot Help needed standing up from a chair using your arms (e.g., wheelchair or bedside chair)?: A Lot Help needed to walk in hospital room?: A Lot Help needed climbing 3-5 steps with a railing? : A Lot 6 Click Score: 12    End of Session Equipment Utilized During Treatment: Gait belt Activity Tolerance: Patient limited by pain Patient left: with call bell/phone within reach;in chair;with chair alarm set Nurse Communication: Mobility status PT Visit Diagnosis: Unsteadiness on feet (R26.81);Muscle weakness (generalized) (M62.81);History of falling (Z91.81);Difficulty in walking, not elsewhere classified (R26.2)     Time: 2536-6440 PT Time Calculation (min) (ACUTE ONLY): 17 min  Charges:  $Therapeutic Activity: 8-22 mins                     Bonney Leitz , PTA Acute Rehabilitation Services Pager [phone] Office [phone]     Tiburcio Linder Artis Delay 11/15/2020, 3:02 PM

## 2020-11-15 NOTE — NC FL2 (Signed)
[place] MEDICAID FL2 LEVEL OF CARE SCREENING TOOL     IDENTIFICATION  Patient Name: Claudia [name] Birthdate: 09-03-1931 Sex: female Admission Date (Current Location): 11/11/2020  [facility] and [place] Number:  Producer, television/film/video and Address:  [facility], [address] [place], [place] [address]      Provider Number: [id number]  Attending Physician Name and Address:  Hughie Closs, MD  Relative Name and Phone Number:       Current Level of Care: Hospital Recommended Level of Care: Skilled Nursing Facility Prior Approval Number:    Date Approved/Denied:   PASRR Number: [id number]  Discharge Plan: SNF    Current Diagnoses: Patient Active Problem List   Diagnosis Date Noted  . Closed comminuted intertrochanteric fracture of proximal femur, right, initial encounter (HCC) 11/11/2020  . Closed right hip fracture (HCC) 11/11/2020  . Pain   . Palliative care by specialist   . Goals of care, counseling/discussion   . DNR (do not resuscitate)   . DNI (do not intubate)   . Encounter for hospice care discussion   . Fall at home, initial encounter 07/28/2020  . Fall 07/28/2020  . Weakness   . Closed fracture of nasal bones   . Acute cystitis without hematuria   . Pain management contract signed 04/08/2020  . Protein-calorie malnutrition, severe (HCC) 03/11/2020  . Hyperlipidemia 12/20/2017  . Routine general medical examination at a health care facility 12/19/2016  . Insomnia 06/19/2015  . Narcotic dependence (HCC) 12/13/2013  . Chronic back pain 12/13/2013  . Hypertension     Orientation RESPIRATION BLADDER Height & Weight     Self,Time,Situation,Place  Normal Continent,Incontinent Weight:   Height:     BEHAVIORAL SYMPTOMS/MOOD NEUROLOGICAL BOWEL NUTRITION STATUS      Continent Diet  AMBULATORY STATUS COMMUNICATION OF NEEDS Skin   Extensive Assist Verbally Normal                       Personal Care Assistance Level of  Assistance  Bathing,Dressing,Feeding Bathing Assistance: Maximum assistance Feeding assistance: Independent Dressing Assistance: Maximum assistance     Functional Limitations Info  Sight,Hearing,Speech Sight Info: Adequate Hearing Info: Adequate Speech Info: Adequate    SPECIAL CARE FACTORS FREQUENCY  PT (By licensed PT),OT (By licensed OT)     PT Frequency: 5x a week OT Frequency: 5x a week            Contractures Contractures Info: Not present    Additional Factors Info  Code Status,Allergies Code Status Info: Full Allergies Info: Amitiza (Lubiprostone)   Benadryl (Diphenhydramine)   Ultram (Tramadol)   Aspirin   Hm Lidocaine Patch (Lidocaine)   Ibuprofen   Sulfa Antibiotics           Current Medications (11/15/2020):  This is the current hospital active medication list Current Facility-Administered Medications  Medication Dose Route Frequency Provider Last Rate Last Admin  . acetaminophen (TYLENOL) tablet 650 mg  650 mg Oral Q8H Montez Morita, PA-C   650 mg at 11/15/20 1345  . Chlorhexidine Gluconate Cloth 2 % PADS 6 each  6 each Topical Daily Hughie Closs, MD   6 each at 11/15/20 1051  . diltiazem (CARDIZEM CD) 24 hr capsule 240 mg  240 mg Oral QPM Montez Morita, PA-C   240 mg at 11/14/20 1756  . docusate sodium (COLACE) capsule 100 mg  100 mg Oral BID Montez Morita, PA-C   100 mg at 11/15/20 1055  .  docusate sodium (COLACE) capsule 100-200 mg  100-200 mg Oral Daily PRN Montez Morita, PA-C      . feeding supplement (ENSURE ENLIVE / ENSURE PLUS) liquid 237 mL  237 mL Oral BID BM Montez Morita, PA-C   237 mL at 11/15/20 1345  . fentaNYL (DURAGESIC) 100 MCG/HR 1 patch  1 patch Transdermal Pecola Leisure, PA-C   1 patch at 11/15/20 1052  . HYDROcodone-acetaminophen (NORCO/VICODIN) 5-325 MG per tablet 1-2 tablet  1-2 tablet Oral Q6H PRN Montez Morita, PA-C   1 tablet at 11/14/20 1043  . lactated ringers infusion   Intravenous Continuous Montez Morita, PA-C 50 mL/hr at 11/14/20 3716  New Bag at 11/14/20 9678  . Lifitegrast 5 % SOLN 1 drop  1 drop Both Eyes BID Montez Morita, PA-C   1 drop at 11/15/20 1050  . menthol-cetylpyridinium (CEPACOL) lozenge 3 mg  1 lozenge Oral PRN Montez Morita, PA-C       Or  . phenol (CHLORASEPTIC) mouth spray 1 spray  1 spray Mouth/Throat PRN Montez Morita, PA-C      . mirtazapine (REMERON) tablet 15 mg  15 mg Oral QHS Montez Morita, PA-C   15 mg at 11/14/20 2116  . multivitamin with minerals tablet 1 tablet  1 tablet Oral Daily Montez Morita, PA-C   1 tablet at 11/15/20 1055  . ondansetron (ZOFRAN) injection 4 mg  4 mg Intravenous Once Montez Morita, PA-C      . ondansetron [facility]) tablet 4 mg  4 mg Oral Q6H PRN Montez Morita, PA-C       Or  . ondansetron [facility]) injection 4 mg  4 mg Intravenous Q6H PRN Montez Morita, PA-C      . simvastatin (ZOCOR) tablet 20 mg  20 mg Oral QPM Montez Morita, PA-C   20 mg at 11/14/20 1756  . triamterene-hydrochlorothiazide (MAXZIDE-25) 37.5-25 MG per tablet 1 tablet  1 tablet Oral Daily Montez Morita, PA-C   1 tablet at 11/15/20 1055     Discharge Medications: Please see discharge summary for a list of discharge medications.  Relevant Imaging Results:  Relevant Lab Results:   Additional Information SSN# [id number] [place]

## 2020-11-15 NOTE — TOC Initial Note (Addendum)
Transition of Care [facility]) - Initial/Assessment Note    Patient Details  Name: Claudia [name] MRN: [id number] Date of Birth: 01-02-1931  [facility]) CM/SW Contact:    Emeterio Reeve, [place] Phone Number: 11/15/2020, 2:22 PM  Clinical Narrative:                  CSW met with pt at bedside. CSW introduced self and explained her role at the hospital.  Pt stated PTA she was living with her disabled daughter. Pt reports she walks with a walker, received meals on wheels food delivery and her grand daughter helps her with showering. Pt gave CSW permission to call daughter, but could not tell csw daughters name.   CSW reviewed pt/ot reccs of SNF with the pt. Pt reports she is not going to SNF and will return home. CSW inquired about in home supports. Pt stated she can get around perfectly fine. Pt stated there are people at home that can help her. Pt reports she has been to [facility] in the past and does not want to return there. CSW informed pt that she can go to a different facility, pt continued to refuse. Pt gave CSW permission to call daughter, but could not tell csw daughters name.   CSW called Pts daughter [name]. [name] stated that most of the information pt gave was true except pt does not have a grand daughter that helps with showering, and she does not have people at home that can help her. Gig reports pts daughter receives an aide through [place], but that person does not assist her in any way. [name] stated that the past couple months her mother has had a steady decline in her mental status. [name] reports that pt is combative at times, has locked herself and the caps worker out of the house. Refuses to allow people to come in the home and help and has had other erratic behaviors. Pt was recently in the ED and an appointment was made for [facility] Neurology in May.  CSW reported these concerns to the MD who will consult psych for a capacity exam, to deermine if pt can refuse SNF at this  time. [name] is willing to become POA and make medical decisions if/when needed.   TOC will continue to follow.   Expected Discharge Plan: Skilled Nursing Facility Barriers to Discharge: Continued Medical Work up   Patient Goals and CMS Choice Patient states their goals for this hospitalization and ongoing recovery are:: To get stronger CMS Medicare.gov Compare Post Acute Care list provided to:: Patient Choice offered to / list presented to : Patient  Expected Discharge Plan and Services Expected Discharge Plan: [place] arrangements for the past 2 months: [place]                                      Prior Living Arrangements/Services Living arrangements for the past 2 months: [place] Lives with:: Adult Children Patient language and need for interpreter reviewed:: Yes Do you feel safe going back to the place where you live?: Yes      Need for Family Participation in Patient Care: Yes (Comment) Care giver support system in place?: Yes (comment) Current home services: DME,Meals on wheels Criminal Activity/Legal Involvement Pertinent to Current Situation/Hospitalization: No - Comment as needed  Activities of Daily Living  Permission Sought/Granted Permission sought to share information with : Family Supports Permission granted to share information with : Yes, Verbal Permission Granted     Permission granted to share info w AGENCY: SNF  Permission granted to share info w Relationship: Daughter  Permission granted to share info w Contact Information: [name]  Emotional Assessment Appearance:: Appears stated age Attitude/Demeanor/Rapport: Engaged,Guarded Affect (typically observed): Appropriate,In denial Orientation: : Oriented to Self,Oriented to Place,Oriented to  Time,Oriented to Situation Alcohol / Substance Use: Not Applicable Psych Involvement: No (comment)  Admission diagnosis:  Fall  [W19.XXXA] Closed right hip fracture ([place]) [S72.001A] Closed fracture of right hip, initial encounter [facility]) [S72.001A] Patient Active Problem List   Diagnosis Date Noted  . Closed comminuted intertrochanteric fracture of proximal femur, right, initial encounter ([place]) 11/11/2020  . Closed right hip fracture ([place]) 11/11/2020  . Pain   . Palliative care by specialist   . Goals of care, counseling/discussion   . DNR (do not resuscitate)   . DNI (do not intubate)   . Encounter for hospice care discussion   . Fall at home, initial encounter 07/28/2020  . Fall 07/28/2020  . Weakness   . Closed fracture of nasal bones   . Acute cystitis without hematuria   . Pain management contract signed 04/08/2020  . Protein-calorie malnutrition, severe ([place]) 03/11/2020  . Hyperlipidemia 12/20/2017  . Routine general medical examination at a health care facility 12/19/2016  . Insomnia 06/19/2015  . Narcotic dependence ([place]) 12/13/2013  . Chronic back pain 12/13/2013  . Hypertension    PCP:  Hoyt Koch, MD Pharmacy:   [place], [place] [address] Phone: [phone] Fax: [phone]     Social Determinants of Health (SDOH) Interventions    Readmission Risk Interventions No flowsheet data found.  Emeterio Reeve, Latanya Presser, [place] Social Worker [phone]

## 2020-11-15 NOTE — Evaluation (Signed)
Occupational Therapy Evaluation Patient Details Name: Claudia [name] MRN: [id number] DOB: [birth year] Today's Date: 11/15/2020    History of Present Illness 87 y.o. female with medical history significant for HTN, chronic back pain with chronic narcotic use who presents by EMS after a fall at home and unable to stand or bear weight after the fall. She was seen yesterday in ER for confusion and discharged home with recommendation for follow up with neurology for evaluation of dementia. Patient sustained R hip fracture. Patient now s/p IM nail R femur on 2/4.   Clinical Impression   Pt walked with a rollator prior to admission and was assisted for ADL, IADL and had mobile meals delivering meals. Difficult to get a clear picture of how pt was managing at home. Per chart, her adoptive daughter's caregiver was also assisting her when she was there. Pt presents with impaired cognition, R hip pain, poor standing balance and generalized weakness. She requires +2 assist for mobility and set up to total assist for ADL. She will need post acute rehab upon discharge in SNF. Will follow acutely.     Follow Up Recommendations  SNF;Supervision/Assistance - 24 hour    Equipment Recommendations  None recommended by OT    Recommendations for Other Services       Precautions / Restrictions Precautions Precautions: Fall Restrictions Weight Bearing Restrictions: Yes RLE Weight Bearing: Weight bearing as tolerated      Mobility Bed Mobility Overal bed mobility: Needs Assistance Bed Mobility: Supine to Sit;Sit to Supine     Supine to sit: Max assist;HOB elevated;+2 for safety/equipment;+2 for physical assistance     General bed mobility comments: pt in chair    Transfers Overall transfer level: Needs assistance Equipment used: Rolling walker (2 wheeled) Transfers: Sit to/from Stand Sit to Stand: Mod assist;+2 physical assistance Stand pivot transfers: +2 physical assistance;Max assist        General transfer comment: assist to rise and steady, cues for hand placement    Balance Overall balance assessment: Needs assistance Sitting-balance support: No upper extremity supported;Feet supported Sitting balance-Leahy Scale: Fair     Standing balance support: Bilateral upper extremity supported;During functional activity Standing balance-Leahy Scale: Poor Standing balance comment: reliant on UE support and external assist                           ADL either performed or assessed with clinical judgement   ADL Overall ADL's : Needs assistance/impaired Eating/Feeding: Independent   Grooming: Oral care;Sitting;Set up   Upper Body Bathing: Minimal assistance;Sitting   Lower Body Bathing: +2 for physical assistance;Total assistance;Sit to/from stand   Upper Body Dressing : Minimal assistance;Sitting   Lower Body Dressing: +2 for physical assistance;Total assistance;Sit to/from stand   Toilet Transfer: +2 for physical assistance;Moderate assistance;Stand-pivot;RW;BSC   Toileting- Clothing Manipulation and Hygiene: +2 for physical assistance;Total assistance;Sit to/from stand               Vision Patient Visual Report: No change from baseline       Perception     Praxis      Pertinent Vitals/Pain Pain Assessment: Faces Faces Pain Scale: Hurts little more Pain Location: R hip Pain Descriptors / Indicators: Grimacing;Sore Pain Intervention(s): Monitored during session;Repositioned     Hand Dominance Right   Extremity/Trunk Assessment Upper Extremity Assessment Upper Extremity Assessment: Overall WFL for tasks assessed   Lower Extremity Assessment Lower Extremity Assessment: Defer to PT evaluation   Cervical /  Trunk Assessment Cervical / Trunk Assessment: Kyphotic   Communication Communication Communication: HOH   Cognition Arousal/Alertness: Awake/alert Behavior During Therapy: WFL for tasks assessed/performed Overall Cognitive Status:  Impaired/Different from baseline Area of Impairment: Memory;Following commands;Safety/judgement;Problem solving;Awareness                     Memory: Decreased short-term memory Following Commands: Follows one step commands inconsistently;Follows one step commands with increased time Safety/Judgement: Decreased awareness of safety;Decreased awareness of deficits Awareness: Emergent Problem Solving: Slow processing;Difficulty sequencing;Requires verbal cues;Requires tactile cues General Comments: pt with varying cooperation   General Comments       Exercises General Exercises - Lower Extremity Ankle Circles/Pumps: Right (performed x 2 reps after reposition of R hip but refused further reps.) Long Arc Quad: AROM;Right;10 reps;Seated   Shoulder Instructions      Home Living Family/patient expects to be discharged to:: Private residence     Type of Home: House Home Access: Level entry     Home Layout: One level     Bathroom Shower/Tub: Chief Strategy Officer: Handicapped height     Home Equipment: Bedside commode;Walker - 4 wheels;Cane - single point;Shower seat          Prior Functioning/Environment Level of Independence: Needs assistance  Gait / Transfers Assistance Needed: ambulates with rollator ADL's / Homemaking Assistance Needed: Her adopted daughter's aide would assist the patient with bathing and dressing while she was there, had mobile meals   Comments: pt with varying reports of who checks on her, daughter vs granddaughter        OT Problem List: Decreased activity tolerance;Decreased strength;Impaired balance (sitting and/or standing);Decreased cognition;Decreased safety awareness;Decreased knowledge of use of DME or AE;Pain      OT Treatment/Interventions: Self-care/ADL training;DME and/or AE instruction;Therapeutic activities;Patient/family education;Balance training    OT Goals(Current goals can be found in the care plan section)  Acute Rehab OT Goals Patient Stated Goal: to go home OT Goal Formulation: With patient Time For Goal Achievement: 11/29/20 Potential to Achieve Goals: Fair ADL Goals Pt Will Transfer to Toilet: with mod assist;ambulating;bedside commode Pt Will Perform Toileting - Clothing Manipulation and hygiene: with mod assist;sit to/from stand Additional ADL Goal #1: Pt will perform bed mobility with moderate assistance in preparation for ADL.  OT Frequency: Min 2X/week   Barriers to D/C: Decreased caregiver support          Co-evaluation              AM-PAC OT "6 Clicks" Daily Activity     Outcome Measure Help from another person eating meals?: None Help from another person taking care of personal grooming?: A Little Help from another person toileting, which includes using toliet, bedpan, or urinal?: Total Help from another person bathing (including washing, rinsing, drying)?: A Lot Help from another person to put on and taking off regular upper body clothing?: A Little Help from another person to put on and taking off regular lower body clothing?: Total 6 Click Score: 14   End of Session Equipment Utilized During Treatment: Gait belt;Rolling walker  Activity Tolerance: Patient tolerated treatment well Patient left: in chair;with call bell/phone within reach;with chair alarm set  OT Visit Diagnosis: Unsteadiness on feet (R26.81);Other abnormalities of gait and mobility (R26.89);Pain;Muscle weakness (generalized) (M62.81);Other symptoms and signs involving cognitive function                Time: 1610-9604 OT Time Calculation (min): 16 min Charges:  OT General Charges $OT  Visit: 1 Visit OT Evaluation $OT Eval Moderate Complexity: 1 Mod Claudia [name], OTR/L Acute Rehabilitation Services Pager: [phone] Office: [phone]  Evern Bio 11/15/2020, 1:57 PM

## 2020-11-15 NOTE — Progress Notes (Signed)
PROGRESS NOTE    Claudia [name]  ZOX:[id number] DOB: January 17, 1931 DOA: 11/11/2020 PCP: [name], MD   Brief Narrative:  Claudia [name] is a 87 y.o. female with medical history significant for HTN, chronic back pain with chronic narcotic use who presented by EMS after a fall at home and unable to stand or bear weight after the fall. She was seen a day prior in ER for confusion and discharged home with recommendation for follow up with neurology for evaluation of dementia. This morning she was found on the floor by her daughter whom she lives with.  Upon presentation to ED, she was hemodynamically stable however Found to have right hip fracture on xray.  Admitted to hospitalist service and orthopedics consulted.  Underwent intramedullary nailing of right hip on 11/12/2020.  Had acute postoperative blood loss anemia drop in hemoglobin to 7.1 and received 1 unit of PRBC transfusion on 11/14/2020.  Assessment & Plan:   Principal Problem:   Closed comminuted intertrochanteric fracture of proximal femur, right, initial encounter [facility]) Active Problems:   Chronic back pain   Hypertension   Fall at home, initial encounter   Closed right hip fracture (HCC)  Closed comminuted right intertrochanteric fracture of proximal femur: Orthopedics on board.  Postoperative day 3 status post intramedullary nailing of the right hip.  Pain controlled.  Management per orthopedics.  PT OT recommends SNF.  Acute blood loss anemia: Presented with hemoglobin over 12 which dropped to 7.1 post surgery on evening of 11/13/2020 for which she received 1 unit of PRBC transfusion.  Hemoglobin improved to 8.5 post transfusion but dropped to less than 8 now.  We will monitor every 12 hours and transfuse if platelets then 7.  Continue to hold Lovenox.  No obvious source of bleeding.  Acute urinary retention: Patient has been having retention.  Has required I/O x2.  Was placed Foley catheter eventually.  Essential  hypertension: Controlled.  Continue diltiazem.  Chronic back pain: Fentanyl patch.  Fall leading to fracture: PT OT recommends SNF.  Hyperlipidemia: Continue statin  DVT prophylaxis: Place and maintain sequential compression device Start: 11/14/20 1050 SCDs Start: 11/12/20 1755 SCDs Start: 11/11/20 1954   Code Status: Full Code  Family Communication:  None present at bedside.  Plan of care discussed with patient in length and he verbalized understanding and agreed with it.  Status is: Inpatient  Remains inpatient appropriate because:Inpatient level of care appropriate due to severity of illness   Dispo: The patient is from: Home              Anticipated d/c is to: SNF              Anticipated d/c date is: 1 to 2 days.              Patient currently is not medically stable to d/c.   Difficult to place patient No        Estimated body mass index is 20.2 kg/m as calculated from the following:   Height as of 11/10/20: 4\' 11"  (1.499 m).   Weight as of 11/10/20: 45.4 kg.      Nutritional status:  Nutrition Problem: Increased nutrient needs Etiology: post-op healing   Signs/Symptoms: estimated needs   Interventions: Ensure Enlive (each supplement provides 350kcal and 20 grams of protein),MVI    Consultants:   Orthopedics  Procedures:   As above  Antimicrobials:  Anti-infectives (From admission, onward)   Start     Dose/Rate Route Frequency  Ordered Stop   11/12/20 1306  ceFAZolin (ANCEF) 2-4 GM/100ML-% IVPB       Note to Pharmacy: Shiela Mayer, Tammy   : cabinet override      11/12/20 1306 11/13/20 0114   11/11/20 2045  cefTRIAXone (ROCEPHIN) 2 g in sodium chloride 0.9 % 100 mL IVPB        2 g 200 mL/hr over 30 Minutes Intravenous  Once 11/11/20 2041 11/11/20 2319         Subjective: Patient seen and examined.  Sitting in the bed.  Alert and oriented with no complaints.  Objective: Vitals:   11/14/20 1447 11/14/20 1700 11/14/20 2024 11/15/20 0448  BP:  104/64 (!) 107/58 108/62 110/63  Pulse: 87 96 90 91  Resp: 16  16 16   Temp: 99 F (37.2 C)  98.9 F (37.2 C) 98.7 F (37.1 C)  TempSrc: Oral  Oral Oral  SpO2: 96%  97% 97%    Intake/Output Summary (Last 24 hours) at 11/15/2020 1018 Last data filed at 11/15/2020 0945 Gross per 24 hour  Intake 297 ml  Output 1522 ml  Net -1225 ml   There were no vitals filed for this visit.  Examination: General exam: Appears calm and comfortable  Respiratory system: Clear to auscultation. Respiratory effort normal. Cardiovascular system: S1 & S2 heard, RRR. No JVD, murmurs, rubs, gallops or clicks. No pedal edema. Gastrointestinal system: Abdomen is nondistended, soft and nontender. No organomegaly or masses felt. Normal bowel sounds heard. Central nervous system: Alert and oriented. No focal neurological deficits. Skin: No rashes, lesions or ulcers.  Psychiatry: Judgement and insight appear normal. Mood & affect appropriate.    Data Reviewed: I have personally reviewed following labs and imaging studies  CBC: Recent Labs  Lab 11/10/20 1330 11/11/20 1732 11/12/20 0401 11/13/20 0338 11/13/20 1721 11/14/20 0127 11/14/20 0845 11/15/20 0243  WBC 6.4 10.5 9.0 9.8 11.6* 9.9  --  6.9  NEUTROABS 5.2 9.2*  --  8.2* 9.4*  --   --   --   HGB 12.5 11.0* 9.9* 8.0* 7.1* 6.2* 8.5* 7.2*  HCT 37.7 33.4* 28.4* 24.6* 21.6* 18.2* 24.7* 22.2*  MCV 85.3 85.9 84.0 85.1 86.4 85.8  --  85.7  PLT 245 230 213 186 190 157  --  162   Basic Metabolic Panel: Recent Labs  Lab 11/10/20 1330 11/11/20 1732 11/11/20 1733 11/12/20 0401 11/13/20 0338 11/14/20 0127  NA 136 138  --  137 136 136  K 2.9* 3.4*  --  3.1* 4.0 3.5  CL 95* 98  --  98 98 100  CO2 30 28  --  26 26 28   GLUCOSE 117* 151*  --  140* 162* 131*  BUN 7* 8  --  11 15 23   CREATININE 0.99 0.76  --  0.84 0.99 0.94  CALCIUM 9.7 9.2  --  8.9 8.4* 7.8*  MG  --   --  1.9  --  1.7  --    GFR: Estimated Creatinine Clearance: 28.8 mL/min (by C-G  formula based on SCr of 0.94 mg/dL). Liver Function Tests: Recent Labs  Lab 11/10/20 1330  AST 19  ALT 12  ALKPHOS 73  BILITOT 0.7  PROT 7.3  ALBUMIN 3.9   No results for input(s): LIPASE, AMYLASE in the last 168 hours. No results for input(s): AMMONIA in the last 168 hours. Coagulation Profile: Recent Labs  Lab 11/11/20 1732  INR 1.1   Cardiac Enzymes: Recent Labs  Lab 11/11/20 1732  CKTOTAL 87  BNP (last 3 results) No results for input(s): PROBNP in the last 8760 hours. HbA1C: No results for input(s): HGBA1C in the last 72 hours. CBG: No results for input(s): GLUCAP in the last 168 hours. Lipid Profile: No results for input(s): CHOL, HDL, LDLCALC, TRIG, CHOLHDL, LDLDIRECT in the last 72 hours. Thyroid Function Tests: No results for input(s): TSH, T4TOTAL, FREET4, T3FREE, THYROIDAB in the last 72 hours. Anemia Panel: No results for input(s): VITAMINB12, FOLATE, FERRITIN, TIBC, IRON, RETICCTPCT in the last 72 hours. Sepsis Labs: No results for input(s): PROCALCITON, LATICACIDVEN in the last 168 hours.  Recent Results (from the past 240 hour(s))  SARS CORONAVIRUS 2 (TAT 6-24 HRS) Nasopharyngeal Nasopharyngeal Swab     Status: None   Collection Time: 11/11/20  5:29 PM   Specimen: Nasopharyngeal Swab  Result Value Ref Range Status   SARS Coronavirus 2 NEGATIVE NEGATIVE Final    Comment: (NOTE) SARS-CoV-2 target nucleic acids are NOT DETECTED.  The SARS-CoV-2 RNA is generally detectable in upper and lower respiratory specimens during the acute phase of infection. Negative results do not preclude SARS-CoV-2 infection, do not rule out co-infections with other pathogens, and should not be used as the sole basis for treatment or other patient management decisions. Negative results must be combined with clinical observations, patient history, and epidemiological information. The expected result is Negative.  Fact Sheet for  Patients: HairSlick.no  Fact Sheet for Healthcare Providers: quierodirigir.com  This test is not yet approved or cleared by the [place] FDA and  has been authorized for detection and/or diagnosis of SARS-CoV-2 by FDA under an Emergency Use Authorization (EUA). This EUA will remain  in effect (meaning this test can be used) for the duration of the COVID-19 declaration under Se ction 564(b)(1) of the Act, 21 U.S.C. section 360bbb-3(b)(1), unless the authorization is terminated or revoked sooner.  Performed at [facility], [address], [place], [place] [address]   Surgical pcr screen     Status: None   Collection Time: 11/12/20 12:38 PM   Specimen: Nasal Mucosa; Nasal Swab  Result Value Ref Range Status   MRSA, PCR NEGATIVE NEGATIVE Final   Staphylococcus aureus NEGATIVE NEGATIVE Final    Comment: (NOTE) The Xpert SA Assay (FDA approved for NASAL specimens in patients 50 years of age and older), is one component of a comprehensive surveillance program. It is not intended to diagnose infection nor to guide or monitor treatment. Performed at [facility], [address] [place], [place] [address]       Radiology Studies: No results found.  Scheduled Meds: . acetaminophen  650 mg Oral Q8H  . Chlorhexidine Gluconate Cloth  6 each Topical Daily  . diltiazem  240 mg Oral QPM  . docusate sodium  100 mg Oral BID  . feeding supplement  237 mL Oral BID BM  . fentaNYL  1 patch Transdermal QODAY  . Lifitegrast  1 drop Both Eyes BID  . mirtazapine  15 mg Oral QHS  . multivitamin with minerals  1 tablet Oral Daily  . ondansetron (ZOFRAN) IV  4 mg Intravenous Once  . simvastatin  20 mg Oral QPM  . triamterene-hydrochlorothiazide  1 tablet Oral Daily   Continuous Infusions: . lactated ringers 50 mL/hr at 11/14/20 0613     LOS: 4 days   Time spent: 26 minutes   Hughie Closs, MD [facility]  11/15/2020, 10:18 AM   To contact the attending provider between 7A-7P or the covering  provider during after hours 7P-7A, please log into the web site [email or web address].

## 2020-11-16 DIAGNOSIS — S72141A Displaced intertrochanteric fracture of right femur, initial encounter for closed fracture: Secondary | ICD-10-CM | POA: Diagnosis not present

## 2020-11-16 LAB — PREPARE RBC (CROSSMATCH)

## 2020-11-16 LAB — CBC
HCT: 23.2 % — ABNORMAL LOW (ref 36.0–46.0)
Hemoglobin: 7.3 g/dL — ABNORMAL LOW (ref 12.0–15.0)
MCH: 27.7 pg (ref 26.0–34.0)
MCHC: 31.5 g/dL (ref 30.0–36.0)
MCV: 87.9 fL (ref 80.0–100.0)
Platelets: 202 10*3/uL (ref 150–400)
RBC: 2.64 MIL/uL — ABNORMAL LOW (ref 3.87–5.11)
RDW: 14.3 % (ref 11.5–15.5)
WBC: 5.5 10*3/uL (ref 4.0–10.5)
nRBC: 0 % (ref 0.0–0.2)

## 2020-11-16 LAB — HEMOGLOBIN AND HEMATOCRIT, BLOOD
HCT: 32.5 % — ABNORMAL LOW (ref 36.0–46.0)
Hemoglobin: 10.5 g/dL — ABNORMAL LOW (ref 12.0–15.0)

## 2020-11-16 MED ORDER — ACETAMINOPHEN 325 MG PO TABS
650.0000 mg | ORAL_TABLET | Freq: Once | ORAL | Status: AC
  Administered 2020-11-17: 650 mg via ORAL

## 2020-11-16 MED ORDER — ATORVASTATIN CALCIUM 10 MG PO TABS
10.0000 mg | ORAL_TABLET | Freq: Every evening | ORAL | Status: DC
  Administered 2020-11-16 – 2020-11-19 (×4): 10 mg via ORAL
  Filled 2020-11-16 (×4): qty 1

## 2020-11-16 MED ORDER — SODIUM CHLORIDE 0.9% IV SOLUTION: Freq: Once | INTRAVENOUS | Status: AC

## 2020-11-16 NOTE — Progress Notes (Signed)
Orthopaedic Trauma Service Progress Note  Patient ID: PREZLEY QADIR MRN: [id number] DOB/AGE: [birth year] 85 y.o.  Subjective:  Not participatory in exam  States she doesn't want me to look at her bandage Not saying much else   ROS As above  Objective:   VITALS:   Vitals:   11/15/20 0448 11/15/20 1612 11/15/20 2042 11/16/20 0421  BP: 110/63 (!) 106/54 (!) 103/53 (!) 117/57  Pulse: 91 77 81 73  Resp: 16 16 16 17   Temp: 98.7 F (37.1 C) 98.8 F (37.1 C) 99 F (37.2 C) 98.6 F (37 C)  TempSrc: Oral Oral Oral Oral  SpO2: 97% 97%  95%    Estimated body mass index is 20.2 kg/m as calculated from the following:   Height as of 11/10/20: 4\' 11"  (1.499 m).   Weight as of 11/10/20: 45.4 kg.   Intake/Output      02/07 0701 02/08 0700 02/08 0701 02/09 0700   P.O. 120    Total Intake 120    Urine 1150    Total Output 1150    Net -1030           LABS  Results for orders placed or performed during the hospital encounter of 11/11/20 (from the past 24 hour(s))  Basic metabolic panel     Status: Abnormal   Collection Time: 11/15/20 11:07 AM  Result Value Ref Range   Sodium 138 135 - 145 mmol/L   Potassium 3.5 3.5 - 5.1 mmol/L   Chloride 98 98 - 111 mmol/L   CO2 31 22 - 32 mmol/L   Glucose, Bld 109 (H) 70 - 99 mg/dL   BUN 12 8 - 23 mg/dL   Creatinine, Ser 01/09/21 0.44 - 1.00 mg/dL   Calcium 8.0 (L) 8.9 - 10.3 mg/dL   GFR, Estimated 01/13/21 2.54 mL/min   Anion gap 9 5 - 15  Hemoglobin and hematocrit, blood     Status: Abnormal   Collection Time: 11/15/20  5:46 PM  Result Value Ref Range   Hemoglobin 7.7 (L) 12.0 - 15.0 g/dL   HCT >06 (L) 01/13/21 - 23.7 %     PHYSICAL EXAM:   Gen: awake, NAD Lungs: unlabored Ext:       Right Lower Extremity  Dressings c/d/i  Ext warm   + DP pulse  No following commands to move toes or ankle   Swelling stable   Assessment/Plan: 4 Days Post-Op   Principal  Problem:   Closed comminuted intertrochanteric fracture of proximal femur, right, initial encounter (HCC) Active Problems:   Chronic back pain   Hypertension   Fall at home, initial encounter   Closed right hip fracture (HCC)   Anti-infectives (From admission, onward)   Start     Dose/Rate Route Frequency Ordered Stop   11/12/20 1306  ceFAZolin (ANCEF) 2-4 GM/100ML-% IVPB       Note to Pharmacy: 31.5, Tammy   : cabinet override      11/12/20 1306 11/13/20 0114   11/11/20 2045  cefTRIAXone (ROCEPHIN) 2 g in sodium chloride 0.9 % 100 mL IVPB        2 g 200 mL/hr over 30 Minutes Intravenous  Once 11/11/20 2041 11/11/20 2319    .  POD/HD#: 32  87 y/o female s/p fall with R IT femur fracture   -  Fall  -R hip fracture s/p IMN  WBAT with assistance  ROM as tolerated  Dressing changes as needed starting today  Ok to clean wounds with soap and water  PT/OT    Will need SNF   - Pain management:  Chronic pain    Had quite the escalation of opiate meds around may/june 2021.  Prior to this date her daily MME was around 180 but once fentanyl patch started her MME went up to 360    Suspect some role in her fall      ? Wean patch    Continue with scheduled tylenol   - ABL anemia/Hemodynamics  Will transfuse another unit PRBCs today   - Medical issues   Per primary   - DVT/PE prophylaxis:  scds  lovenox on hold until H/H stabilize   - ID:   periop abx completed  - Metabolic Bone Disease:  Vitamin d deficiency    Supplement   - Activity:  Up with assistance   - FEN/GI prophylaxis/Foley/Lines:  Reg diet, thin liquids  - Impediments to fracture healing:  Fragility fracture  Vitamin d deficiency   - Dispo:  Continue with inpt care  Will likely need snf  Psych consult pending to evaluate capacity to make decisions    Mearl Latin, PA-C [phone] (C) 11/16/2020, 9:13 AM  [facility] [address] [place]  [address] [phone] Val [phone] (F)    After 5pm and on the weekends please log on to Amion, go to orthopaedics and the look under the [facility] Call for the provider(s) on call. You can also call our office at [phone] and then follow the prompts to be connected to the call team.

## 2020-11-16 NOTE — Progress Notes (Signed)
PROGRESS NOTE    VILLA BURGIN  AGT:[id number] DOB: 1931-09-15 DOA: 11/11/2020 PCP: [name], MD   Brief Narrative:  Claudia [name] is a 87 y.o. female with medical history significant for HTN, chronic back pain with chronic narcotic use who presented by EMS after a fall at home and unable to stand or bear weight after the fall. She was seen a day prior in ER for confusion and discharged home with recommendation for follow up with neurology for evaluation of dementia. This morning she was found on the floor by her daughter whom she lives with.  Upon presentation to ED, she was hemodynamically stable however Found to have right hip fracture on xray.  Admitted to hospitalist service and orthopedics consulted.  Underwent intramedullary nailing of right hip on 11/12/2020.  Had acute postoperative blood loss anemia drop in hemoglobin to 7.1 and received 1 unit of PRBC transfusion on 11/14/2020.  Hemoglobin down to 7.3 today.  Also is transfusing 1 more unit.  Assessment & Plan:   Principal Problem:   Closed comminuted intertrochanteric fracture of proximal femur, right, initial encounter [facility]) Active Problems:   Chronic back pain   Hypertension   Fall at home, initial encounter   Closed right hip fracture (HCC)  Closed comminuted right intertrochanteric fracture of proximal femur: Orthopedics on board.  Postoperative day 4 status post intramedullary nailing of the right hip.  Pain controlled.  Management per orthopedics.  PT OT recommends SNF.  Acute blood loss anemia: Presented with hemoglobin over 12 which dropped to 7.1 post surgery on evening of 11/13/2020 for which she received 1 unit of PRBC transfusion.  Hemoglobin improved to 8.5 post transfusion but dropped to 7.3 this morning.  Also is transfusing 1 more unit today.  Continue to monitor H&H every 12 hours.  Continue to hold Lovenox for now.    Acute urinary retention: Patient has been having retention.  Has required I/O x2.  Was  placed Foley catheter eventually.  Essential hypertension: Controlled.  Continue diltiazem.  Chronic back pain: Fentanyl patch.  Fall leading to fracture: PT OT recommends SNF.  However patient is declining to do that.  Patient remains alert and oriented and pleasant however she does seem to have some underlying cognitive issue.  I was informed yesterday that patient's daughter is concerned about patient's cognition and capacity and would really want the patient to go to SNF as patient lives with another daughter who is disabled and patient in fact takes care of the disabled daughter as well.  Obviously now with hip surgery, she is not capable of doing that.  I personally also doubt that she has any capacity to make decisions.  I have consulted psychiatry for formal capacity evaluation.  Hyperlipidemia: Continue statin  DVT prophylaxis: Place and maintain sequential compression device Start: 11/14/20 1050 SCDs Start: 11/12/20 1755 SCDs Start: 11/11/20 1954   Code Status: Full Code  Family Communication:  None present at bedside.    Status is: Inpatient  Remains inpatient appropriate because:Inpatient level of care appropriate due to severity of illness   Dispo: The patient is from: Home              Anticipated d/c is to: SNF              Anticipated d/c date is: 1 to 2 days.              Patient currently is not medically stable to d/c.   Difficult to  place patient No        Estimated body mass index is 20.2 kg/m as calculated from the following:   Height as of 11/10/20: 4\' 11"  (1.499 m).   Weight as of 11/10/20: 45.4 kg.      Nutritional status:  Nutrition Problem: Increased nutrient needs Etiology: post-op healing   Signs/Symptoms: estimated needs   Interventions: Ensure Enlive (each supplement provides 350kcal and 20 grams of protein),MVI    Consultants:   Orthopedics  Procedures:   As above  Antimicrobials:  Anti-infectives (From admission, onward)    Start     Dose/Rate Route Frequency Ordered Stop   11/12/20 1306  ceFAZolin (ANCEF) 2-4 GM/100ML-% IVPB       Note to Pharmacy: 01/10/21, Tammy   : cabinet override      11/12/20 1306 11/13/20 0114   11/11/20 2045  cefTRIAXone (ROCEPHIN) 2 g in sodium chloride 0.9 % 100 mL IVPB        2 g 200 mL/hr over 30 Minutes Intravenous  Once 11/11/20 2041 11/11/20 2319         Subjective: Patient seen and examined.  She was alert and oriented but slow in response.  She was upset that she gets bothered multiple times for several reasons including staff coming in and out of her room, blood draws etc. she declined to go to SNF.  She would not tell me the reason.  Objective: Vitals:   11/15/20 0448 11/15/20 1612 11/15/20 2042 11/16/20 0421  BP: 110/63 (!) 106/54 (!) 103/53 (!) 117/57  Pulse: 91 77 81 73  Resp: 16 16 16 17   Temp: 98.7 F (37.1 C) 98.8 F (37.1 C) 99 F (37.2 C) 98.6 F (37 C)  TempSrc: Oral Oral Oral Oral  SpO2: 97% 97%  95%    Intake/Output Summary (Last 24 hours) at 11/16/2020 1100 Last data filed at 11/16/2020 1042 Gross per 24 hour  Intake -  Output 2100 ml  Net -2100 ml   There were no vitals filed for this visit.  Examination:  General exam: Appears calm and comfortable  Respiratory system: Clear to auscultation. Respiratory effort normal. Cardiovascular system: S1 & S2 heard, RRR. No JVD, murmurs, rubs, gallops or clicks. No pedal edema. Gastrointestinal system: Abdomen is nondistended, soft and nontender. No organomegaly or masses felt. Normal bowel sounds heard. Central nervous system: Alert and oriented. No focal neurological deficits. Skin: No rashes, lesions or ulcers.  Psychiatry: Judgement and insight appear poor.  Mood and affect flat.  Data Reviewed: I have personally reviewed following labs and imaging studies  CBC: Recent Labs  Lab 11/10/20 1330 11/11/20 1732 11/12/20 0401 11/13/20 0338 11/13/20 1721 11/14/20 0127 11/14/20 0845  11/15/20 0243 11/15/20 1746 11/16/20 0908  WBC 6.4 10.5   < > 9.8 11.6* 9.9  --  6.9  --  5.5  NEUTROABS 5.2 9.2*  --  8.2* 9.4*  --   --   --   --   --   HGB 12.5 11.0*   < > 8.0* 7.1* 6.2* 8.5* 7.2* 7.7* 7.3*  HCT 37.7 33.4*   < > 24.6* 21.6* 18.2* 24.7* 22.2* 24.5* 23.2*  MCV 85.3 85.9   < > 85.1 86.4 85.8  --  85.7  --  87.9  PLT 245 230   < > 186 190 157  --  162  --  202   < > = values in this interval not displayed.   Basic Metabolic Panel: Recent Labs  Lab 11/11/20  1732 11/11/20 1733 11/12/20 0401 11/13/20 0338 11/14/20 0127 11/15/20 1107  NA 138  --  137 136 136 138  K 3.4*  --  3.1* 4.0 3.5 3.5  CL 98  --  98 98 100 98  CO2 28  --  26 26 28 31   GLUCOSE 151*  --  140* 162* 131* 109*  BUN 8  --  11 15 23 12   CREATININE 0.76  --  0.84 0.99 0.94 0.79  CALCIUM 9.2  --  8.9 8.4* 7.8* 8.0*  MG  --  1.9  --  1.7  --   --    GFR: Estimated Creatinine Clearance: 33.8 mL/min (by C-G formula based on SCr of 0.79 mg/dL). Liver Function Tests: Recent Labs  Lab 11/10/20 1330  AST 19  ALT 12  ALKPHOS 73  BILITOT 0.7  PROT 7.3  ALBUMIN 3.9   No results for input(s): LIPASE, AMYLASE in the last 168 hours. No results for input(s): AMMONIA in the last 168 hours. Coagulation Profile: Recent Labs  Lab 11/11/20 1732  INR 1.1   Cardiac Enzymes: Recent Labs  Lab 11/11/20 1732  CKTOTAL 87   BNP (last 3 results) No results for input(s): PROBNP in the last 8760 hours. HbA1C: No results for input(s): HGBA1C in the last 72 hours. CBG: No results for input(s): GLUCAP in the last 168 hours. Lipid Profile: No results for input(s): CHOL, HDL, LDLCALC, TRIG, CHOLHDL, LDLDIRECT in the last 72 hours. Thyroid Function Tests: No results for input(s): TSH, T4TOTAL, FREET4, T3FREE, THYROIDAB in the last 72 hours. Anemia Panel: No results for input(s): VITAMINB12, FOLATE, FERRITIN, TIBC, IRON, RETICCTPCT in the last 72 hours. Sepsis Labs: No results for input(s): PROCALCITON,  LATICACIDVEN in the last 168 hours.  Recent Results (from the past 240 hour(s))  SARS CORONAVIRUS 2 (TAT 6-24 HRS) Nasopharyngeal Nasopharyngeal Swab     Status: None   Collection Time: 11/11/20  5:29 PM   Specimen: Nasopharyngeal Swab  Result Value Ref Range Status   SARS Coronavirus 2 NEGATIVE NEGATIVE Final    Comment: (NOTE) SARS-CoV-2 target nucleic acids are NOT DETECTED.  The SARS-CoV-2 RNA is generally detectable in upper and lower respiratory specimens during the acute phase of infection. Negative results do not preclude SARS-CoV-2 infection, do not rule out co-infections with other pathogens, and should not be used as the sole basis for treatment or other patient management decisions. Negative results must be combined with clinical observations, patient history, and epidemiological information. The expected result is Negative.  Fact Sheet for Patients: HairSlick.no  Fact Sheet for Healthcare Providers: quierodirigir.com  This test is not yet approved or cleared by the [place] FDA and  has been authorized for detection and/or diagnosis of SARS-CoV-2 by FDA under an Emergency Use Authorization (EUA). This EUA will remain  in effect (meaning this test can be used) for the duration of the COVID-19 declaration under Se ction 564(b)(1) of the Act, 21 U.S.C. section 360bbb-3(b)(1), unless the authorization is terminated or revoked sooner.  Performed at [facility], [address], [place], [place] [address]   Surgical pcr screen     Status: None   Collection Time: 11/12/20 12:38 PM   Specimen: Nasal Mucosa; Nasal Swab  Result Value Ref Range Status   MRSA, PCR NEGATIVE NEGATIVE Final   Staphylococcus aureus NEGATIVE NEGATIVE Final    Comment: (NOTE) The Xpert SA Assay (FDA approved for NASAL specimens in patients 44 years of age and older), is one  component of a comprehensive surveillance program. It is  not intended to diagnose infection nor to guide or monitor treatment. Performed at [facility], [address], [place], [place] [address]       Radiology Studies: No results found.  Scheduled Meds: . sodium chloride   Intravenous Once  . acetaminophen  650 mg Oral Q8H  . acetaminophen  650 mg Oral Once  . Chlorhexidine Gluconate Cloth  6 each Topical Daily  . diltiazem  240 mg Oral QPM  . docusate sodium  100 mg Oral BID  . feeding supplement  237 mL Oral BID BM  . fentaNYL  1 patch Transdermal QODAY  . Lifitegrast  1 drop Both Eyes BID  . mirtazapine  15 mg Oral QHS  . multivitamin with minerals  1 tablet Oral Daily  . ondansetron (ZOFRAN) IV  4 mg Intravenous Once  . simvastatin  20 mg Oral QPM  . triamterene-hydrochlorothiazide  1 tablet Oral Daily   Continuous Infusions: . lactated ringers 50 mL/hr at 11/15/20 2059     LOS: 5 days   Time spent: 29 minutes   Hughie Closs, MD [facility]  11/16/2020, 11:00 AM   To contact the attending provider between 7A-7P or the covering provider during after hours 7P-7A, please log into the web site [email or web address].

## 2020-11-16 NOTE — TOC Progression Note (Signed)
Transition of Care [facility]) - Progression Note    Patient Details  Name: Claudia [name] MRN: [id number] Date of Birth: 08-Nov-1930  Transition of Care [facility]) CM/SW Contact  Jimmy Picket, [place] Phone Number: 11/16/2020, 11:51 AM  Clinical Narrative:     CSW gave pts daughter [name] bed offers. CSW gave [name] the medicare.gov webstie rating list to review rating scale.   Expected Discharge Plan: Skilled Nursing Facility Barriers to Discharge: Continued Medical Work up  Expected Discharge Plan and Services Expected Discharge Plan: Skilled Nursing Facility       Living arrangements for the past 2 months: Skilled Nursing Facility                                       Social Determinants of Health (SDOH) Interventions    Readmission Risk Interventions No flowsheet data found.  Jimmy Picket, Theresia Majors, [place] Clinical Social Worker [phone]

## 2020-11-17 DIAGNOSIS — G8929 Other chronic pain: Secondary | ICD-10-CM | POA: Diagnosis not present

## 2020-11-17 DIAGNOSIS — M545 Low back pain, unspecified: Secondary | ICD-10-CM

## 2020-11-17 DIAGNOSIS — S72141A Displaced intertrochanteric fracture of right femur, initial encounter for closed fracture: Secondary | ICD-10-CM | POA: Diagnosis not present

## 2020-11-17 LAB — TYPE AND SCREEN
ABO/RH(D): A POS
Antibody Screen: NEGATIVE
Unit division: 0

## 2020-11-17 LAB — BPAM RBC
Blood Product Expiration Date: 202203032359
ISSUE DATE / TIME: 202202081345
Unit Type and Rh: 6200

## 2020-11-17 NOTE — Progress Notes (Signed)
Pt received up in chair. Pleasant and agreeable to seated grooming activities with OT and working on sit to stand from chair with RW x 3, static standing balance as a precursor to ADL.    11/17/20 1600  OT Visit Information  Last OT Received On 11/17/20  Assistance Needed +2  History of Present Illness 87 y.o. female presents to ED by EMS after fall. Pt sustained R hip fracture s/p IM nail R femur 2/4.   Prior to this admission presented to ED (11/10/20) for confusion and d/c home with recommended OP f/u with neuro for eval of dementia. PMH:HTN, chronic back pain with chronic narcotic use.  Precautions  Precautions Fall  Pain Assessment  Pain Assessment Faces  Faces Pain Scale 4  Pain Location R hip  Pain Descriptors / Indicators Grimacing;Sore  Pain Intervention(s) RN gave pain meds during session;Ice applied;Monitored during session;Repositioned  Cognition  Arousal/Alertness Awake/alert  Behavior During Therapy WFL for tasks assessed/performed  Overall Cognitive Status Impaired/Different from baseline  Area of Impairment Following commands;Safety/judgement;Problem solving;Memory  Memory Decreased short-term memory  Following Commands Follows one step commands with increased time (and multimodal cues)  Safety/Judgement Decreased awareness of safety;Decreased awareness of deficits  Problem Solving Slow processing;Difficulty sequencing;Requires verbal cues;Requires tactile cues  General Comments cooperative for all activities  ADL  Overall ADL's  Needs assistance/impaired  Eating/Feeding Set up;Sitting  Eating/Feeding Details (indicate cue type and reason) assist to open ensure bottle  Grooming Oral care;Minimal assistance;Sitting;Wash/dry hands;Wash/dry face  Grooming Details (indicate cue type and reason) assist to open and place paste on brush, set up to wash face and hands  Bed Mobility  General bed mobility comments pt received in chair  Balance  Overall balance assessment Needs  assistance  Sitting balance-Leahy Scale Fair  Sitting balance - Comments at edge of chair  Standing balance support Bilateral upper extremity supported;During functional activity  Standing balance-Leahy Scale Poor  Standing balance comment reliant on UE support and external assist, posterior lean  Restrictions  RLE Weight Bearing WBAT  Transfers  Overall transfer level Needs assistance  Equipment used Rolling walker (2 wheeled)  Transfers Sit to/from Stand  Sit to Stand Mod assist  General transfer comment cues and assist to scoot to edge of chair, multimodal cues for hand placement (pt is used to pulling up on rollator), assist to rise and steady, pt with posterior bias and tends to brace on chair, performed x 3 with rest breaks in between  OT - End of Session  Equipment Utilized During Treatment Gait belt;Rolling walker  Activity Tolerance Patient tolerated treatment well  Patient left in chair;with call bell/phone within reach;with chair alarm set  OT Assessment/Plan  OT Plan Discharge plan remains appropriate  OT Visit Diagnosis Unsteadiness on feet (R26.81);Other abnormalities of gait and mobility (R26.89);Pain;Muscle weakness (generalized) (M62.81);Other symptoms and signs involving cognitive function  OT Frequency (ACUTE ONLY) Min 2X/week  Follow Up Recommendations SNF;Supervision/Assistance - 24 hour  OT Equipment None recommended by OT  AM-PAC OT "6 Clicks" Daily Activity Outcome Measure (Version 2)  Help from another person eating meals? 3  Help from another person taking care of personal grooming? 3  Help from another person toileting, which includes using toliet, bedpan, or urinal? 1  Help from another person bathing (including washing, rinsing, drying)? 2  Help from another person to put on and taking off regular upper body clothing? 3  Help from another person to put on and taking off regular lower body clothing? 1  6  Click Score 13  OT Goal Progression  Progress  towards OT goals Progressing toward goals  Acute Rehab OT Goals  Patient Stated Goal to go home  OT Goal Formulation With patient  Time For Goal Achievement 11/29/20  Potential to Achieve Goals Fair  OT Time Calculation  OT Start Time (ACUTE ONLY) 1542  OT Stop Time (ACUTE ONLY) 1602  OT Time Calculation (min) 20 min  OT General Charges  $OT Visit 1 Visit  OT Treatments  $Therapeutic Activity 8-22 mins  Martie Round, OTR/L Acute Rehabilitation Services Pager: [phone] Office: [phone]

## 2020-11-17 NOTE — Progress Notes (Signed)
Nutrition Follow-up  DOCUMENTATION CODES:   Severe malnutrition in context of chronic illness  INTERVENTION:   -Continue MVI with minerals daily -Continue Ensure Enlive po BID, each supplement provides 350 kcal and 20 grams of protein  NUTRITION DIAGNOSIS:   Severe Malnutrition related to chronic illness (CHF) as evidenced by severe fat depletion,severe muscle depletion.  Ongoing  GOAL:   Patient will meet greater than or equal to 90% of their needs  Progressing  MONITOR:   PO intake,Supplement acceptance,Labs,Weight trends,Skin,I & O's  REASON FOR ASSESSMENT:   Consult Assessment of nutrition requirement/status,Hip fracture protocol  ASSESSMENT:   Claudia [name] is a 87 y.o. female with medical history significant for HTN, chronic back pain with chronic narcotic use who presents by EMS after a fall at home and unable to stand or bear weight after the fall. She was seen yesterday in ER for confusion and discharged home with recommendation for follow up with neurology for evaluation of dementia. This morning she was found on the floor by her daughter whom she lives with.  No family is currently at bedside. Attempted to call daughter but no answer. Pt unable to provide history and minimally responds verbally to questions. Reportedly she was walking in house when she tripped and fell. She was not able to stand up or bear weight. Cried out if right hip touched or right leg moved. Per chart pt uses rollator for assistance with ambulation but unclear if she was using at time of fall.  2/4- s/p PROCEDURE:  INTRAMEDULLARY NAILING OF THE RIGHT HIP using a [employer] nail 11 X 360 MM statically locked  Reviewed I/O's: -4.2 L x 24 hours and -5.8 L since admission  UOP: 5.4 L x 24 hours  Case discussed with RN prior to visit, who reports pt is taking medications well.   Spoke with pt at bedside, who was unable to provide meaningful history. Pt endorses visual deficits, however,  was able to locate items on meal tray and feed herself. Noted meal completion 50-100%. Pt consumed about half of her lunch. She has also been consuming Ensure Enlive supplements.   Per TOC notes, plan for SNF at discharge.   Medications reviewed and include cardizem and remeron.   Labs reviewed.   NUTRITION - FOCUSED PHYSICAL EXAM:  Flowsheet Row Most Recent Value  Orbital Region Severe depletion  Upper Arm Region Severe depletion  Thoracic and Lumbar Region Severe depletion  Buccal Region Severe depletion  Temple Region Severe depletion  Clavicle Bone Region Severe depletion  Clavicle and Acromion Bone Region Severe depletion  Scapular Bone Region Severe depletion  Dorsal Hand Severe depletion  Patellar Region Severe depletion  Anterior Thigh Region Severe depletion  Posterior Calf Region Severe depletion  Edema (RD Assessment) None  Hair Reviewed  Eyes Reviewed  Mouth Reviewed  Skin Reviewed  Nails Reviewed       Diet Order:   Diet Order            Diet Heart Room service appropriate? Yes; Fluid consistency: Thin  Diet effective now                 EDUCATION NEEDS:   No education needs have been identified at this time  Skin:  Skin Assessment: Skin Integrity Issues: Skin Integrity Issues:: Incisions Incisions: closed rt hip  Last BM:  11/14/20  Height:   Ht Readings from Last 1 Encounters:  11/10/20 4\' 11"  (1.499 m)    Weight:   Wt Readings  from Last 1 Encounters:  11/10/20 45.4 kg    Ideal Body Weight:  44.7 kg  BMI:  There is no height or weight on file to calculate BMI.  Estimated Nutritional Needs:   Kcal:  1350-1550  Protein:  65-80 grams  Fluid:  > 1.3 L    Levada Schilling, RD, LDN, CDCES Registered Dietitian II Certified Diabetes Care and Education Specialist Please refer to [facility] for RD and/or RD on-call/weekend/after hours pager

## 2020-11-17 NOTE — Progress Notes (Signed)
Physical Therapy Treatment Patient Details Name: Claudia [name] MRN: [id number] DOB: 09/18/1931 Today's Date: 11/17/2020    History of Present Illness 87 y.o. female presents to ED by EMS after fall. Pt sustained R hip fracture s/p IM nail R femur 2/4.   Prior to this admission presented to ED (11/10/20) for confusion and d/c home with recommended OP f/u with neuro for eval of dementia. PMH:HTN, chronic back pain with chronic narcotic use.    PT Comments    Pt supine in bed on arrival this session.  Pt required increased time to perform movements and responded better to PT intervention this session.  Pt continues to require mod to max assistance to mobilize.  Continue to recommend snf placement to improve strength, balance and function.     Follow Up Recommendations  SNF;Supervision/Assistance - 24 hour     Equipment Recommendations  Rolling walker with 5" wheels;3in1 (PT)    Recommendations for Other Services       Precautions / Restrictions Precautions Precautions: Fall Restrictions Weight Bearing Restrictions: Yes RLE Weight Bearing: Weight bearing as tolerated    Mobility  Bed Mobility Overal bed mobility: Needs Assistance Bed Mobility: Supine to Sit;Sit to Supine     Supine to sit: Min assist;Mod assist     General bed mobility comments: Min assistance to move LEs toward edge of bed and elevate trunk into a seated position.  Pt required mod assistance with use of bed pad to scoot to edge of bed.  Transfers Overall transfer level: Needs assistance Equipment used: Rolling walker (2 wheeled) Transfers: Sit to/from Stand Sit to Stand: Mod assist Stand pivot transfers: Max assist       General transfer comment: Mod assistance to rise into standing.  Cues for hand placement. max assistance to progress steps away from bed toward recliner.  Ambulation/Gait Ambulation/Gait assistance: Max assist Gait Distance (Feet): 4 Feet Assistive device: Rolling walker (2  wheeled) Gait Pattern/deviations: Step-to pattern;Shuffle;Trunk flexed;Leaning posteriorly     General Gait Details: Pt with difficulty advancing steps from bed to recliner.  Poor balance and facilitation to achieve hip extension.   Stairs             Wheelchair Mobility    Modified Rankin (Stroke Patients Only)       Balance Overall balance assessment: Needs assistance Sitting-balance support: No upper extremity supported;Feet supported Sitting balance-Leahy Scale: Fair       Standing balance-Leahy Scale: Poor Standing balance comment: reliant on UE support and external assist                            Cognition Arousal/Alertness: Awake/alert Behavior During Therapy: WFL for tasks assessed/performed Overall Cognitive Status: Impaired/Different from baseline Area of Impairment: Following commands;Safety/judgement;Problem solving;Memory                     Memory: Decreased short-term memory Following Commands: Follows one step commands inconsistently;Follows one step commands with increased time Safety/Judgement: Decreased awareness of safety;Decreased awareness of deficits   Problem Solving: Slow processing;Difficulty sequencing;Requires verbal cues;Requires tactile cues General Comments: Pt very cooperative this session but limited due to pain.      Exercises General Exercises - Lower Extremity Ankle Circles/Pumps: AROM;Both;10 reps;Supine Quad Sets: AROM;Both;10 reps;Supine Long Arc Quad: AROM;Both;10 reps;Supine Heel Slides: AROM;AAROM;Both;10 reps;Supine    General Comments        Pertinent Vitals/Pain Pain Assessment: Faces Faces Pain Scale: Hurts little more Pain  Location: R hip Pain Descriptors / Indicators: Grimacing;Sore Pain Intervention(s): Monitored during session;Repositioned;Patient requesting pain meds-RN notified    Home Living                      Prior Function            PT Goals (current goals  can now be found in the care plan section) Acute Rehab PT Goals Patient Stated Goal: to go home Potential to Achieve Goals: Fair Progress towards PT goals: Progressing toward goals    Frequency    Min 3X/week      PT Plan Current plan remains appropriate    Co-evaluation              AM-PAC PT "6 Clicks" Mobility   Outcome Measure  Help needed turning from your back to your side while in a flat bed without using bedrails?: A Lot Help needed moving from lying on your back to sitting on the side of a flat bed without using bedrails?: A Lot Help needed moving to and from a bed to a chair (including a wheelchair)?: A Lot Help needed standing up from a chair using your arms (e.g., wheelchair or bedside chair)?: A Lot Help needed to walk in hospital room?: A Lot Help needed climbing 3-5 steps with a railing? : A Lot 6 Click Score: 12    End of Session Equipment Utilized During Treatment: Gait belt Activity Tolerance: Patient limited by pain Patient left: with call bell/phone within reach;in chair;with chair alarm set Nurse Communication: Mobility status PT Visit Diagnosis: Unsteadiness on feet (R26.81);Muscle weakness (generalized) (M62.81);History of falling (Z91.81);Difficulty in walking, not elsewhere classified (R26.2)     Time: 3382-5053 PT Time Calculation (min) (ACUTE ONLY): 20 min  Charges:  $Therapeutic Activity: 8-22 mins                     Bonney Leitz , PTA Acute Rehabilitation Services Pager [phone] Office [phone]     Claudia [name] Delay 11/17/2020, 3:24 PM

## 2020-11-17 NOTE — Progress Notes (Signed)
  Speech Language Pathology Treatment: Dysphagia  Patient Details Name: Claudia [name] MRN: [id number] DOB: 14-Oct-1930 Today's Date: 11/17/2020 Time: 1020-1030 SLP Time Calculation (min) (ACUTE ONLY): 10 min  Assessment / Plan / Recommendation Clinical Impression  Pt tolerating regular solids and thin liquids well. No signs of aspiration or dysphagia noted, pt without complaint. Will sign off.   HPI HPI: 87 y.o. female with medical history significant for HTN, chronic back pain with chronic narcotic use who presents by EMS after a fall at home and unable to stand or bear weight after the fall. She was seen yesterday in ER for confusion and discharged home with recommendation for follow up with neurology for evaluation of dementia. This morning she was found on the floor by her daughter whom she lives with.  No family is currently at bedside. Attempted to call daughter but no answer. Pt unable to provide history and minimally responds verbally to questions. Reportedly she was walking in house when she tripped and fell. She was not able to stand up or bear weight. Cried out if right hip touched or right leg moved. Per chart pt uses rollator for assistance with ambulation but unclear if she was using at time of fall.      SLP Plan  All goals met       Recommendations  Diet recommendations: Regular;Thin liquid Liquids provided via: Cup;Straw Medication Administration: Whole meds with liquid Supervision: Patient able to self feed Postural Changes and/or Swallow Maneuvers: Seated upright 90 degrees                Plan: All goals met       GO               Herbie Baltimore, MA CCC-SLP  Acute Rehabilitation Services Pager [phone] Office [phone]  Lynann Beaver 11/17/2020, 12:13 PM

## 2020-11-17 NOTE — Progress Notes (Signed)
Patient ID: Claudia [name], female   DOB: Jul 23, 1931, 87 y.o.   MRN: [id number]  PROGRESS NOTE    Cabella Kimm Kyser  JHE:[id number] DOB: 28-Jun-1931 DOA: 11/11/2020 PCP: [name], MD   Brief Narrative:  87 y.o.femalewith medical history significant forHTN, chronic back pain with chronic narcotic use who presented by EMS after a fall at home and unable to stand or bear weight after the fall.  On presentation, she was found to have right hip fracture.  She underwent intramedullary nailing of the right hip on 11/12/2020.  She required 2 units packed red cells during the hospitalization for postoperative blood loss anemia.  Assessment & Plan:   Closed comminuted right intertrochanteric fracture of proximal femur status post most likely mechanical fall - underwent intramedullary nailing of the right hip on 11/12/2020.   Wound care as per orthopedics recommendations.   -PT OT recommends SNF.  Social worker following.  Currently medically stable for discharge. -Fall precautions.  Incentive spirometry.  Acute blood loss anemia -Status post 2 units packed red cells transfusion during the hospitalization, second unit was on 11/16/2020.  Repeat hemoglobin 10.5 on 11/16/2020 after transfusion.  Lovenox on hold because of anemia.  Acute urinary retention -Required in and out catheterizations and subsequent Foley catheter placement.    Essential hypertension: Controlled.  Continue diltiazem.  Chronic back pain: Fentanyl patch.  Disposition - PT OT recommended SNF.    Patient refused SNF placement.  There was concerns regarding patient's cognition and capacity.  Daughter interested in patient being placed in SNF. -Psychiatry evaluation appreciated: Psychiatry thinks that patient does not have capacity to make medical decisions.  -will consult palliative care for goals of care discussion  Hyperlipidemia: Continue statin   DVT prophylaxis: SCDs Code Status: Full Family Communication: None at  bedside Disposition Plan: Status is: Inpatient  Remains inpatient appropriate because:Hemodynamically unstable and Inpatient level of care appropriate due to severity of illness   Dispo: The patient is from: Home              Anticipated d/c is to: SNF              Anticipated d/c date is: 1 day              Patient currently is medically stable to d/c.   Difficult to place patient No  Consultants: Orthopedics  Procedures: IM nailing of right hip on 11/12/2020  Antimicrobials: Perioperative   Subjective: Patient seen and examined at bedside.  Poor historian.  No overnight fever, vomiting, worsening shortness of breath reported.  Objective: Vitals:   11/16/20 1405 11/16/20 1423 11/16/20 1712 11/16/20 2022  BP: (!) 119/54 (!) 109/56  (!) 109/58  Pulse: 83 82 91 73  Resp: 18 18 20 17   Temp: 98.4 F (36.9 C) 98.4 F (36.9 C) 98.5 F (36.9 C) 98.7 F (37.1 C)  TempSrc: Oral Axillary Axillary Oral  SpO2: 98% 100% 98% 97%    Intake/Output Summary (Last 24 hours) at 11/17/2020 1119 Last data filed at 11/17/2020 0423 Gross per 24 hour  Intake 960 ml  Output 3550 ml  Net -2590 ml   There were no vitals filed for this visit.  Examination:  General exam: Appears calm and comfortable.  No distress.  Elderly female lying in bed.  Poor historian.  Flat affect.  Does not participate in conversation much. Respiratory system: Bilateral decreased breath sounds at bases with scattered crackles Cardiovascular system: S1 & S2 heard, Rate controlled Gastrointestinal system:  Abdomen is nondistended, soft and nontender. Normal bowel sounds heard. Extremities: No cyanosis, clubbing; trace lower extremity edema   Data Reviewed: I have personally reviewed following labs and imaging studies  CBC: Recent Labs  Lab 11/10/20 1330 11/11/20 1732 11/12/20 0401 11/13/20 0338 11/13/20 1721 11/14/20 0127 11/14/20 0845 11/15/20 0243 11/15/20 1746 11/16/20 0908 11/16/20 1828  WBC 6.4 10.5    < > 9.8 11.6* 9.9  --  6.9  --  5.5  --   NEUTROABS 5.2 9.2*  --  8.2* 9.4*  --   --   --   --   --   --   HGB 12.5 11.0*   < > 8.0* 7.1* 6.2* 8.5* 7.2* 7.7* 7.3* 10.5*  HCT 37.7 33.4*   < > 24.6* 21.6* 18.2* 24.7* 22.2* 24.5* 23.2* 32.5*  MCV 85.3 85.9   < > 85.1 86.4 85.8  --  85.7  --  87.9  --   PLT 245 230   < > 186 190 157  --  162  --  202  --    < > = values in this interval not displayed.   Basic Metabolic Panel: Recent Labs  Lab 11/11/20 1732 11/11/20 1733 11/12/20 0401 11/13/20 0338 11/14/20 0127 11/15/20 1107  NA 138  --  137 136 136 138  K 3.4*  --  3.1* 4.0 3.5 3.5  CL 98  --  98 98 100 98  CO2 28  --  26 26 28 31   GLUCOSE 151*  --  140* 162* 131* 109*  BUN 8  --  11 15 23 12   CREATININE 0.76  --  0.84 0.99 0.94 0.79  CALCIUM 9.2  --  8.9 8.4* 7.8* 8.0*  MG  --  1.9  --  1.7  --   --    GFR: Estimated Creatinine Clearance: 33.8 mL/min (by C-G formula based on SCr of 0.79 mg/dL). Liver Function Tests: Recent Labs  Lab 11/10/20 1330  AST 19  ALT 12  ALKPHOS 73  BILITOT 0.7  PROT 7.3  ALBUMIN 3.9   No results for input(s): LIPASE, AMYLASE in the last 168 hours. No results for input(s): AMMONIA in the last 168 hours. Coagulation Profile: Recent Labs  Lab 11/11/20 1732  INR 1.1   Cardiac Enzymes: Recent Labs  Lab 11/11/20 1732  CKTOTAL 87   BNP (last 3 results) No results for input(s): PROBNP in the last 8760 hours. HbA1C: No results for input(s): HGBA1C in the last 72 hours. CBG: No results for input(s): GLUCAP in the last 168 hours. Lipid Profile: No results for input(s): CHOL, HDL, LDLCALC, TRIG, CHOLHDL, LDLDIRECT in the last 72 hours. Thyroid Function Tests: No results for input(s): TSH, T4TOTAL, FREET4, T3FREE, THYROIDAB in the last 72 hours. Anemia Panel: No results for input(s): VITAMINB12, FOLATE, FERRITIN, TIBC, IRON, RETICCTPCT in the last 72 hours. Sepsis Labs: No results for input(s): PROCALCITON, LATICACIDVEN in the last 168  hours.  Recent Results (from the past 240 hour(s))  SARS CORONAVIRUS 2 (TAT 6-24 HRS) Nasopharyngeal Nasopharyngeal Swab     Status: None   Collection Time: 11/11/20  5:29 PM   Specimen: Nasopharyngeal Swab  Result Value Ref Range Status   SARS Coronavirus 2 NEGATIVE NEGATIVE Final    Comment: (NOTE) SARS-CoV-2 target nucleic acids are NOT DETECTED.  The SARS-CoV-2 RNA is generally detectable in upper and lower respiratory specimens during the acute phase of infection. Negative results do not preclude SARS-CoV-2 infection, do not rule out  co-infections with other pathogens, and should not be used as the sole basis for treatment or other patient management decisions. Negative results must be combined with clinical observations, patient history, and epidemiological information. The expected result is Negative.  Fact Sheet for Patients: HairSlick.no  Fact Sheet for Healthcare Providers: quierodirigir.com  This test is not yet approved or cleared by the [place] FDA and  has been authorized for detection and/or diagnosis of SARS-CoV-2 by FDA under an Emergency Use Authorization (EUA). This EUA will remain  in effect (meaning this test can be used) for the duration of the COVID-19 declaration under Se ction 564(b)(1) of the Act, 21 U.S.C. section 360bbb-3(b)(1), unless the authorization is terminated or revoked sooner.  Performed at [facility], [address] [place], [place] [address]   Surgical pcr screen     Status: None   Collection Time: 11/12/20 12:38 PM   Specimen: Nasal Mucosa; Nasal Swab  Result Value Ref Range Status   MRSA, PCR NEGATIVE NEGATIVE Final   Staphylococcus aureus NEGATIVE NEGATIVE Final    Comment: (NOTE) The Xpert SA Assay (FDA approved for NASAL specimens in patients 23 years of age and older), is one component of a comprehensive surveillance program. It is not intended to diagnose  infection nor to guide or monitor treatment. Performed at [facility], [address] [place], [place] [address]          Radiology Studies: No results found.      Scheduled Meds: . acetaminophen  650 mg Oral Q8H  . acetaminophen  650 mg Oral Once  . atorvastatin  10 mg Oral QPM  . Chlorhexidine Gluconate Cloth  6 each Topical Daily  . diltiazem  240 mg Oral QPM  . docusate sodium  100 mg Oral BID  . feeding supplement  237 mL Oral BID BM  . fentaNYL  1 patch Transdermal QODAY  . Lifitegrast  1 drop Both Eyes BID  . mirtazapine  15 mg Oral QHS  . multivitamin with minerals  1 tablet Oral Daily  . ondansetron (ZOFRAN) IV  4 mg Intravenous Once  . triamterene-hydrochlorothiazide  1 tablet Oral Daily   Continuous Infusions: . lactated ringers 50 mL/hr at 11/16/20 2121          Glade Lloyd, MD [facility] 11/17/2020, 11:19 AM

## 2020-11-17 NOTE — TOC Progression Note (Signed)
Transition of Care [facility]) - Progression Note    Patient Details  Name: LAKEIA BRADSHAW MRN: [id number] Date of Birth: August 22, 1931  [facility]) CM/SW Contact  Jimmy Picket, [place] Phone Number: 11/17/2020, 4:45 PM  Clinical Narrative:     CSW spoke to pts daughter [name]. [name] chose [facility] for SNF. CSW reached out to admission coordinator to inform her. CSW requested covid test.  Expected Discharge Plan: Skilled Nursing Facility Barriers to Discharge: Continued Medical Work up  Expected Discharge Plan and Services Expected Discharge Plan: Skilled Nursing Facility       Living arrangements for the past 2 months: Skilled Nursing Facility                                       Social Determinants of Health (SDOH) Interventions    Readmission Risk Interventions No flowsheet data found.  Jimmy Picket, Theresia Majors, [place] Clinical Social Worker [phone]

## 2020-11-18 DIAGNOSIS — S72141A Displaced intertrochanteric fracture of right femur, initial encounter for closed fracture: Secondary | ICD-10-CM | POA: Diagnosis not present

## 2020-11-18 DIAGNOSIS — D62 Acute posthemorrhagic anemia: Secondary | ICD-10-CM | POA: Diagnosis not present

## 2020-11-18 DIAGNOSIS — I10 Essential (primary) hypertension: Secondary | ICD-10-CM | POA: Diagnosis not present

## 2020-11-18 LAB — SARS CORONAVIRUS 2 (TAT 6-24 HRS): SARS Coronavirus 2: NEGATIVE

## 2020-11-18 LAB — CBC
HCT: 27.3 % — ABNORMAL LOW (ref 36.0–46.0)
Hemoglobin: 9.3 g/dL — ABNORMAL LOW (ref 12.0–15.0)
MCH: 29.3 pg (ref 26.0–34.0)
MCHC: 34.1 g/dL (ref 30.0–36.0)
MCV: 86.1 fL (ref 80.0–100.0)
Platelets: 245 10*3/uL (ref 150–400)
RBC: 3.17 MIL/uL — ABNORMAL LOW (ref 3.87–5.11)
RDW: 14.6 % (ref 11.5–15.5)
WBC: 6.2 10*3/uL (ref 4.0–10.5)
nRBC: 0 % (ref 0.0–0.2)

## 2020-11-18 MED ORDER — DILTIAZEM HCL ER COATED BEADS 120 MG PO CP24: 240.0000 mg | ORAL_CAPSULE | Freq: Every evening | ORAL | Status: AC

## 2020-11-18 MED ORDER — FENTANYL 100 MCG/HR TD PT72: 1.0000 | MEDICATED_PATCH | TRANSDERMAL | 0 refills | Status: DC

## 2020-11-18 MED ORDER — POLYETHYLENE GLYCOL 3350 17 GM/SCOOP PO POWD: 17.0000 g | Freq: Every day | ORAL | Status: AC | PRN

## 2020-11-18 NOTE — Discharge Summary (Addendum)
Physician Discharge Summary  Claudia [name]:[id number] DOB: [birth year] DOA: 11/11/2020  PCP: Claudia Broker, MD  Admit date: 11/11/2020 Discharge date: 11/19/2020  Admitted From: Home Disposition: SNF  Recommendations for Outpatient Follow-up:  1. Follow up with SNF provider at earliest convenience 2. Outpatient follow-up with orthopedics.  Wound care/activity as per orthopedics recommendations. 3. Follow up in ED if symptoms worsen or new appear   Home Health: No Equipment/Devices: None  Discharge Condition: Guarded CODE STATUS: Full Diet recommendation: Heart healthy  Brief/Interim Summary: 87 y.o.femalewith medical history significant forHTN, chronic back pain with chronic narcotic use who presented by EMS after a fall at home and unable to stand or bear weight after the fall.  On presentation, she was found to have right hip fracture.  She underwent intramedullary nailing of the right hip on 11/12/2020.  She required 2 units packed red cells during the hospitalization for postoperative blood loss anemia.  PT recommended SNF placement.  She will be discharged to SNF once bed is available.  Addendum on 11/19/2020 Patient was supposed to be discharged to SNF on 11/18/2020 but for some reason patient was not discharged.  Social worker following. She is medically stable for discharge.    Discharge Diagnoses:   Closed comminuted right intertrochanteric fracture of proximal femur status post most likely mechanical fall - underwent intramedullary nailing of the right hip on 11/12/2020.  Wound care as per orthopedics recommendations.   -PT OT recommended SNF.   Currently medically stable for discharge. -Fall precautions.  Incentive spirometry. -She will be discharged to SNF once bed is available.  Acute blood loss anemia -Status post 2 units packed red cells transfusion during the hospitalization, second unit was on 11/16/2020.    Hemoglobin  9.3 this morning.  Lovenox on hold  because of anemia.  Acute urinary retention -Required in and out catheterizations and subsequent Foley catheter placement.    Voiding trial can be done at [facility].  Essential hypertension: Controlled. Continue diltiazem.  Chronic back pain: Fentanyl patch.  Disposition - PT OT recommended SNF.  Patient refused SNF placement.  There was concerns regarding patient's cognition and capacity.  Daughter interested in patient being placed in SNF. -Psychiatry evaluation appreciated: Psychiatry thinks that patient does not have capacity to make medical decisions. -Palliative care for goals of care discussion is pending.  This can be done as an outpatient.  Hyperlipidemia: Continue statin  Discharge Instructions  Discharge Instructions    Diet - low sodium heart healthy   Complete by: As directed    Discharge wound care:   Complete by: As directed    Wound care as per orthopedics recommendations   Increase activity slowly   Complete by: As directed      Allergies as of 11/18/2020      Reactions   Amitiza [lubiprostone] Nausea And Vomiting   Benadryl [diphenhydramine] Other (See Comments)   Makes me hyper   Ultram [tramadol]    Made sick to stomach   Aspirin Other (See Comments)   Upsets stomach, and nervousness. "I feel like I'm going to scream."   Hm Lidocaine Patch [lidocaine] Rash   Ibuprofen Other (See Comments)   Jittery, nervousness, similar to aspirin   Sulfa Antibiotics Nausea And Vomiting, Rash      Medication List    STOP taking these medications   melatonin 3 MG Tabs tablet     TAKE these medications   acetaminophen 500 MG tablet Commonly known as: TYLENOL Take 500 mg by  mouth in the morning and at bedtime.   diltiazem 120 MG 24 hr capsule Commonly known as: CARDIZEM CD Take 2 capsules (240 mg total) by mouth every evening.   DSS 100 MG Caps Take 100 mg by mouth daily. What changed:   how much to take  when to take this  reasons to take this    fentaNYL 100 MCG/HR Commonly known as: DURAGESIC Place 1 patch onto the skin every 3 (three) days. What changed:   how much to take  how to take this  when to take this  additional instructions   mirtazapine 15 MG tablet Commonly known as: REMERON Take 1 tablet (15 mg total) by mouth at bedtime.   pantoprazole 40 MG tablet Commonly known as: PROTONIX TAKE 1 TABLET EACH DAY. What changed:   how much to take  how to take this  when to take this  additional instructions   polyethylene glycol powder 17 GM/SCOOP powder Commonly known as: GLYCOLAX/MIRALAX Take 17 g by mouth daily as needed for mild constipation. What changed: See the new instructions.   simvastatin 20 MG tablet Commonly known as: ZOCOR Take 1 tablet (20 mg total) by mouth every evening.   triamterene-hydrochlorothiazide 37.5-25 MG tablet Commonly known as: MAXZIDE-25 Take 1 tablet by mouth daily.   Xiidra 5 % Soln Generic drug: Lifitegrast Place 1 drop into both eyes in the morning and at bedtime.            Discharge Care Instructions  (From admission, onward)         Start     Ordered   11/18/20 0000  Discharge wound care:       Comments: Wound care as per orthopedics recommendations   11/18/20 1093          Follow-up Information    Claudia Broker, MD. Schedule an appointment as soon as possible for a visit in 1 week(s).   Specialty: Internal Medicine Contact information: [address] [place] [address] [phone]        Claudia Galas, MD. Schedule an appointment as soon as possible for a visit in 1 week(s).   Specialty: Orthopedic Surgery Contact information: [address] [place] [address] [phone]              Allergies  Allergen Reactions  . Amitiza [Lubiprostone] Nausea And Vomiting  . Benadryl [Diphenhydramine] Other (See Comments)    Makes me hyper  . Ultram [Tramadol]     Made sick to stomach  . Aspirin Other (See  Comments)    Upsets stomach, and nervousness. "I feel like I'm going to scream."  . Hm Lidocaine Patch [Lidocaine] Rash  . Ibuprofen Other (See Comments)    Jittery, nervousness, similar to aspirin  . Sulfa Antibiotics Nausea And Vomiting and Rash    Consultations:  Orthopedic/psychiatry   Procedures/Studies: DG Chest 1 View  Result Date: 11/11/2020 CLINICAL DATA:  Post fall with right hip fracture. EXAM: CHEST  1 VIEW COMPARISON:  Radiograph 07/28/2020 FINDINGS: Patient is rotated. The heart is normal in size. There is aortic atherosclerosis and tortuosity, tortuosity exaggerated by rotation. Left lung apex not entirely included in the field of view. No pneumothorax or focal airspace disease. No pleural fluid. Bones under mineralized. Scoliotic curvature of the upper lumbar spine. No acute osseous abnormalities are seen. IMPRESSION: 1. No acute abnormality. 2. Aortic atherosclerosis and tortuosity, tortuosity exaggerated by rotation. Aortic Atherosclerosis (ICD10-I70.0). Electronically Signed   By: Narda Rutherford  M.D.   On: 11/11/2020 16:07   DG Knee 2 Views Right  Result Date: 11/11/2020 CLINICAL DATA:  Fall.  Hip fracture EXAM: RIGHT KNEE - 1-2 VIEW COMPARISON:  None FINDINGS: Normal alignment no fracture. Moderate degenerative change in the lateral joint space with mild joint space narrowing and spurring. No effusion. IMPRESSION: Negative for fracture. Electronically Signed   By: Marlan Palau M.D.   On: 11/11/2020 18:14   DG C-Arm 1-60 Min  Result Date: 11/12/2020 CLINICAL DATA:  Surgery, elective. Additional history provided: Intramedullary (IM) nail femoral right. Provided fluoroscopy time 1 minutes, 14 seconds (9.5 mGy). EXAM: RIGHT FEMUR 2 VIEWS; DG C-ARM 1-60 MIN COMPARISON:  Radiographs of the hips 11/11/2020. FINDINGS: Patient with known acute right intratrochanteric hip fracture. Eight intraoperative fluoroscopic images of the right hip and femur are submitted. The images  demonstrate an intramedullary nail within the femur with proximal and distal interlocking screws. There is essentially anatomic alignment. No unexpected finding. IMPRESSION: Eight intraoperative fluoroscopic images of the right hip and femur, as described. Electronically Signed   By: Jackey Loge DO   On: 11/12/2020 15:25   DG Hip Unilat  With Pelvis 2-3 Views Right  Result Date: 11/11/2020 CLINICAL DATA:  Post fall with right hip pain. EXAM: DG HIP (WITH OR WITHOUT PELVIS) 2-3V RIGHT COMPARISON:  Radiograph 10/25/2018 FINDINGS: Comminuted displaced intertrochanteric right proximal femur fracture. There is displacement of the lesser and greater trochanters and mild proximal migration of the femoral shaft. Femoral head remains seated. No additional fracture of the pelvis. Pubic rami are intact. Bones are under mineralized. Pubic symphysis and sacroiliac joints are congruent. Advanced vascular calcifications. IMPRESSION: Comminuted displaced intertrochanteric right proximal femur fracture. Electronically Signed   By: Narda Rutherford M.D.   On: 11/11/2020 16:05   DG FEMUR, MIN 2 VIEWS RIGHT  Result Date: 11/12/2020 CLINICAL DATA:  Surgery, elective. Additional history provided: Intramedullary (IM) nail femoral right. Provided fluoroscopy time 1 minutes, 14 seconds (9.5 mGy). EXAM: RIGHT FEMUR 2 VIEWS; DG C-ARM 1-60 MIN COMPARISON:  Radiographs of the hips 11/11/2020. FINDINGS: Patient with known acute right intratrochanteric hip fracture. Eight intraoperative fluoroscopic images of the right hip and femur are submitted. The images demonstrate an intramedullary nail within the femur with proximal and distal interlocking screws. There is essentially anatomic alignment. No unexpected finding. IMPRESSION: Eight intraoperative fluoroscopic images of the right hip and femur, as described. Electronically Signed   By: Jackey Loge DO   On: 11/12/2020 15:25   DG FEMUR PORT, MIN 2 VIEWS RIGHT  Result Date:  11/12/2020 CLINICAL DATA:  Status post right hip ORIF EXAM: RIGHT FEMUR PORTABLE 2 VIEW COMPARISON:  11/11/2020 FINDINGS: Interval right hip ORIF with a a long intramedullary rod with single distal interlocking screw. Fracture fragments are in anatomic alignment. The femoral head is well seated within the right acetabulum. Avulsion of the lesser trochanter is again identified. No unexpected fracture or dislocation. Moderate bicompartmental degenerative arthritis of the right knee is incidentally noted. Trace right knee effusion. Advanced vascular calcifications are seen within the right thigh. IMPRESSION: Interval right hip ORIF. Normal alignment. Avulsion of the lesser trochanter noted. Electronically Signed   By: Helyn Numbers MD   On: 11/12/2020 16:14       Subjective: Patient seen and examined at bedside.  Poor historian.  No overnight worsening shortness of breath, fever, vomiting reported.  Discharge Exam: Vitals:   11/17/20 1938 11/18/20 0358  BP: (!) 106/58 121/61  Pulse: 86 79  Resp:  17 15  Temp: 98.8 F (37.1 C) 98.7 F (37.1 C)  SpO2: 100% 96%    General: Awake, looks chronically ill.  No distress.  Currently on room air.  Flat affect.  Does not participate in conversation much. Cardiovascular: rate controlled, S1/S2 + Respiratory: bilateral decreased breath sounds at bases with some scattered crackles Abdominal: Soft, NT, ND, bowel sounds + Extremities: Trace lower extremity edema; no cyanosis    The results of significant diagnostics from this hospitalization (including imaging, microbiology, ancillary and laboratory) are listed below for reference.     Microbiology: Recent Results (from the past 240 hour(s))  SARS CORONAVIRUS 2 (TAT 6-24 HRS) Nasopharyngeal Nasopharyngeal Swab     Status: None   Collection Time: 11/11/20  5:29 PM   Specimen: Nasopharyngeal Swab  Result Value Ref Range Status   SARS Coronavirus 2 NEGATIVE NEGATIVE Final    Comment:  (NOTE) SARS-CoV-2 target nucleic acids are NOT DETECTED.  The SARS-CoV-2 RNA is generally detectable in upper and lower respiratory specimens during the acute phase of infection. Negative results do not preclude SARS-CoV-2 infection, do not rule out co-infections with other pathogens, and should not be used as the sole basis for treatment or other patient management decisions. Negative results must be combined with clinical observations, patient history, and epidemiological information. The expected result is Negative.  Fact Sheet for Patients: HairSlick.nohttps://www.fda.gov/media/138098/download  Fact Sheet for Healthcare Providers: quierodirigir.comhttps://www.fda.gov/media/138095/download  This test is not yet approved or cleared by the [place] FDA and  has been authorized for detection and/or diagnosis of SARS-CoV-2 by FDA under an Emergency Use Authorization (EUA). This EUA will remain  in effect (meaning this test can be used) for the duration of the COVID-19 declaration under Se ction 564(b)(1) of the Act, 21 U.S.C. section 360bbb-3(b)(1), unless the authorization is terminated or revoked sooner.  Performed at [facility], [address] [place], [place] [address]   Surgical pcr screen     Status: None   Collection Time: 11/12/20 12:38 PM   Specimen: Nasal Mucosa; Nasal Swab  Result Value Ref Range Status   MRSA, PCR NEGATIVE NEGATIVE Final   Staphylococcus aureus NEGATIVE NEGATIVE Final    Comment: (NOTE) The Xpert SA Assay (FDA approved for NASAL specimens in patients 85 years of age and older), is one component of a comprehensive surveillance program. It is not intended to diagnose infection nor to guide or monitor treatment. Performed at [facility], [address] [place], [place] [address]   SARS CORONAVIRUS 2 (TAT 6-24 HRS) Nasopharyngeal Nasopharyngeal Swab     Status: None   Collection Time: 11/17/20  4:42 PM   Specimen: Nasopharyngeal Swab  Result Value Ref  Range Status   SARS Coronavirus 2 NEGATIVE NEGATIVE Final    Comment: (NOTE) SARS-CoV-2 target nucleic acids are NOT DETECTED.  The SARS-CoV-2 RNA is generally detectable in upper and lower respiratory specimens during the acute phase of infection. Negative results do not preclude SARS-CoV-2 infection, do not rule out co-infections with other pathogens, and should not be used as the sole basis for treatment or other patient management decisions. Negative results must be combined with clinical observations, patient history, and epidemiological information. The expected result is Negative.  Fact Sheet for Patients: HairSlick.nohttps://www.fda.gov/media/138098/download  Fact Sheet for Healthcare Providers: quierodirigir.comhttps://www.fda.gov/media/138095/download  This test is not yet approved or cleared by the [place] FDA and  has been authorized for detection and/or diagnosis of SARS-CoV-2 by FDA under an Emergency Use Authorization (  EUA). This EUA will remain  in effect (meaning this test can be used) for the duration of the COVID-19 declaration under Se ction 564(b)(1) of the Act, 21 U.S.C. section 360bbb-3(b)(1), unless the authorization is terminated or revoked sooner.  Performed at [facility], [address] [place], [place] [address]      Labs: BNP (last 3 results) No results for input(s): BNP in the last 8760 hours. Basic Metabolic Panel: Recent Labs  Lab 11/11/20 1732 11/11/20 1733 11/12/20 0401 11/13/20 0338 11/14/20 0127 11/15/20 1107  NA 138  --  137 136 136 138  K 3.4*  --  3.1* 4.0 3.5 3.5  CL 98  --  98 98 100 98  CO2 28  --  26 26 28 31   GLUCOSE 151*  --  140* 162* 131* 109*  BUN 8  --  11 15 23 12   CREATININE 0.76  --  0.84 0.99 0.94 0.79  CALCIUM 9.2  --  8.9 8.4* 7.8* 8.0*  MG  --  1.9  --  1.7  --   --    Liver Function Tests: No results for input(s): AST, ALT, ALKPHOS, BILITOT, PROT, ALBUMIN in the last 168 hours. No results for input(s): LIPASE, AMYLASE  in the last 168 hours. No results for input(s): AMMONIA in the last 168 hours. CBC: Recent Labs  Lab 11/11/20 1732 11/12/20 0401 11/13/20 0338 11/13/20 1721 11/14/20 0127 11/14/20 0845 11/15/20 0243 11/15/20 1746 11/16/20 0908 11/16/20 1828 11/18/20 0046  WBC 10.5   < > 9.8 11.6* 9.9  --  6.9  --  5.5  --  6.2  NEUTROABS 9.2*  --  8.2* 9.4*  --   --   --   --   --   --   --   HGB 11.0*   < > 8.0* 7.1* 6.2*   < > 7.2* 7.7* 7.3* 10.5* 9.3*  HCT 33.4*   < > 24.6* 21.6* 18.2*   < > 22.2* 24.5* 23.2* 32.5* 27.3*  MCV 85.9   < > 85.1 86.4 85.8  --  85.7  --  87.9  --  86.1  PLT 230   < > 186 190 157  --  162  --  202  --  245   < > = values in this interval not displayed.   Cardiac Enzymes: Recent Labs  Lab 11/11/20 1732  CKTOTAL 87   BNP: Invalid input(s): POCBNP CBG: No results for input(s): GLUCAP in the last 168 hours. D-Dimer No results for input(s): DDIMER in the last 72 hours. Hgb A1c No results for input(s): HGBA1C in the last 72 hours. Lipid Profile No results for input(s): CHOL, HDL, LDLCALC, TRIG, CHOLHDL, LDLDIRECT in the last 72 hours. Thyroid function studies No results for input(s): TSH, T4TOTAL, T3FREE, THYROIDAB in the last 72 hours.  Invalid input(s): FREET3 Anemia work up No results for input(s): VITAMINB12, FOLATE, FERRITIN, TIBC, IRON, RETICCTPCT in the last 72 hours. Urinalysis    Component Value Date/Time   COLORURINE YELLOW 11/11/2020 2252   APPEARANCEUR CLEAR 11/11/2020 2252   LABSPEC 1.006 11/11/2020 2252   PHURINE 6.0 11/11/2020 2252   GLUCOSEU NEGATIVE 11/11/2020 2252   GLUCOSEU NEGATIVE 09/07/2020 1411   HGBUR NEGATIVE 11/11/2020 2252   BILIRUBINUR NEGATIVE 11/11/2020 2252   BILIRUBINUR Neg 12/16/2018 1328   KETONESUR NEGATIVE 11/11/2020 2252   PROTEINUR NEGATIVE 11/11/2020 2252   UROBILINOGEN 0.2 09/07/2020 1411   NITRITE NEGATIVE 11/11/2020 2252   LEUKOCYTESUR NEGATIVE 11/11/2020  2252   Sepsis Labs Invalid input(s):  PROCALCITONIN,  WBC,  LACTICIDVEN Microbiology Recent Results (from the past 240 hour(s))  SARS CORONAVIRUS 2 (TAT 6-24 HRS) Nasopharyngeal Nasopharyngeal Swab     Status: None   Collection Time: 11/11/20  5:29 PM   Specimen: Nasopharyngeal Swab  Result Value Ref Range Status   SARS Coronavirus 2 NEGATIVE NEGATIVE Final    Comment: (NOTE) SARS-CoV-2 target nucleic acids are NOT DETECTED.  The SARS-CoV-2 RNA is generally detectable in upper and lower respiratory specimens during the acute phase of infection. Negative results do not preclude SARS-CoV-2 infection, do not rule out co-infections with other pathogens, and should not be used as the sole basis for treatment or other patient management decisions. Negative results must be combined with clinical observations, patient history, and epidemiological information. The expected result is Negative.  Fact Sheet for Patients: HairSlick.no  Fact Sheet for Healthcare Providers: quierodirigir.com  This test is not yet approved or cleared by the [place] FDA and  has been authorized for detection and/or diagnosis of SARS-CoV-2 by FDA under an Emergency Use Authorization (EUA). This EUA will remain  in effect (meaning this test can be used) for the duration of the COVID-19 declaration under Se ction 564(b)(1) of the Act, 21 U.S.C. section 360bbb-3(b)(1), unless the authorization is terminated or revoked sooner.  Performed at [facility], [address], [place], [place] [address]   Surgical pcr screen     Status: None   Collection Time: 11/12/20 12:38 PM   Specimen: Nasal Mucosa; Nasal Swab  Result Value Ref Range Status   MRSA, PCR NEGATIVE NEGATIVE Final   Staphylococcus aureus NEGATIVE NEGATIVE Final    Comment: (NOTE) The Xpert SA Assay (FDA approved for NASAL specimens in patients 63 years of age and older), is one component of a comprehensive surveillance  program. It is not intended to diagnose infection nor to guide or monitor treatment. Performed at [facility], [address], [place], [place] [address]   SARS CORONAVIRUS 2 (TAT 6-24 HRS) Nasopharyngeal Nasopharyngeal Swab     Status: None   Collection Time: 11/17/20  4:42 PM   Specimen: Nasopharyngeal Swab  Result Value Ref Range Status   SARS Coronavirus 2 NEGATIVE NEGATIVE Final    Comment: (NOTE) SARS-CoV-2 target nucleic acids are NOT DETECTED.  The SARS-CoV-2 RNA is generally detectable in upper and lower respiratory specimens during the acute phase of infection. Negative results do not preclude SARS-CoV-2 infection, do not rule out co-infections with other pathogens, and should not be used as the sole basis for treatment or other patient management decisions. Negative results must be combined with clinical observations, patient history, and epidemiological information. The expected result is Negative.  Fact Sheet for Patients: HairSlick.no  Fact Sheet for Healthcare Providers: quierodirigir.com  This test is not yet approved or cleared by the [place] FDA and  has been authorized for detection and/or diagnosis of SARS-CoV-2 by FDA under an Emergency Use Authorization (EUA). This EUA will remain  in effect (meaning this test can be used) for the duration of the COVID-19 declaration under Se ction 564(b)(1) of the Act, 21 U.S.C. section 360bbb-3(b)(1), unless the authorization is terminated or revoked sooner.  Performed at [facility], [address], [place], [place] [address]      Time coordinating discharge: 35 minutes  SIGNED:   Glade Lloyd, MD  [facility] 11/18/2020, 8:21 AM

## 2020-11-19 NOTE — TOC Transition Note (Signed)
Transition of Care [facility]) - CM/SW Discharge Note   Patient Details  Name: Claudia [name] MRN: [id number] Date of Birth: 04-04-1931  [facility] CM/SW Contact:  Deatra Robinson, [place] Phone Number: 11/19/2020, 1:55 PM   Clinical Narrative:  Pt for dc to [facility] today. Spoke to [place] at [facility] who confirmed they are prepared to admit pt to room 108. Dtr is aware of dc and agreeable. Vesta Mixer ref # [id number] received and provided to [facility]. PTAR arranged and RN provided with number for report. SW signing off at dc.   Dellie Burns, MSW, LCSW [phone] (coverage)       Final next level of care: Skilled Nursing Facility Barriers to Discharge: No Barriers Identified   Patient Goals and CMS Choice Patient states their goals for this hospitalization and ongoing recovery are:: To get stronger CMS Medicare.gov Compare Post Acute Care list provided to:: Patient Choice offered to / list presented to : Adult Children  Discharge Placement              Patient chooses bed at: Other - please specify in the comment section below: ([facility]) Patient to be transferred to facility by: PTAR Name of family member notified: [name]/Dtr Patient and family notified of of transfer: 11/19/20  Discharge Plan and Services                                     Social Determinants of Health (SDOH) Interventions     Readmission Risk Interventions No flowsheet data found.

## 2020-11-19 NOTE — Progress Notes (Signed)
Patient ID: Claudia [name], female   DOB: [birth year], 87 y.o.   MRN: [id number] Patient was supposed to be discharged to SNF on 11/18/2020 but for some reason patient was not discharged.  Social worker following.  Patient seen and examined at bedside.  She is medically stable for discharge.  Please refer to the discharge summary done by me on 11/18/2020 for full details.

## 2020-11-19 NOTE — Progress Notes (Signed)
Physical Therapy Treatment Patient Details Name: Claudia [name] MRN: [id number] DOB: 05-12-1931 Today's Date: 11/19/2020    History of Present Illness 87 y.o. female presents to ED by EMS after fall. Pt sustained R hip fracture s/p IM nail R femur 2/4.   Prior to this admission presented to ED (11/10/20) for confusion and d/c home with recommended OP f/u with neuro for eval of dementia. PMH:HTN, chronic back pain with chronic narcotic use.    PT Comments    Pt eager to get OOB upon PT arrival to room. Pt overall requiring mod-max assist for bed mobility and transfer to recliner this day, pt very limited by posterior bias and LOB requiring max PT assist to correct. Pt with little awareness or concern over LOB. Pt instructed in LE exercises to address LE weakness, pt with limited tolerance secondary to R hip pain. PT to continue to follow acutely.     Follow Up Recommendations  SNF;Supervision/Assistance - 24 hour     Equipment Recommendations  Rolling walker with 5" wheels;3in1 (PT)    Recommendations for Other Services       Precautions / Restrictions Precautions Precautions: Fall Restrictions Weight Bearing Restrictions: No RLE Weight Bearing: Weight bearing as tolerated    Mobility  Bed Mobility Overal bed mobility: Needs Assistance Bed Mobility: Supine to Sit     Supine to sit: Mod assist     General bed mobility comments: Mod assist for trunk elevation, LE translation to EOB, scooting to EOB with use of bed pad. Pt reporting R hip pain during bed mobility.    Transfers Overall transfer level: Needs assistance Equipment used: Rolling walker (2 wheeled)   Sit to Stand: Mod assist Stand pivot transfers: Max assist       General transfer comment: assist for power up, rise, and steady. Max assist for pivot to recliner for steadying, correcting posterior bias and LOB, PT having to bring recliner right behind pt prematurely given pt's unsteadiness and LE  weakness.  Ambulation/Gait             General Gait Details: unable at this time, LE weakness and posterior LOB   Stairs             Wheelchair Mobility    Modified Rankin (Stroke Patients Only)       Balance Overall balance assessment: Needs assistance Sitting-balance support: No upper extremity supported;Feet supported Sitting balance-Leahy Scale: Fair       Standing balance-Leahy Scale: Poor Standing balance comment: reliant on RW and PT assist                            Cognition Arousal/Alertness: Awake/alert Behavior During Therapy: WFL for tasks assessed/performed Overall Cognitive Status: Impaired/Different from baseline Area of Impairment: Following commands;Safety/judgement;Problem solving                       Following Commands: Follows one step commands with increased time Safety/Judgement: Decreased awareness of safety;Decreased awareness of deficits   Problem Solving: Slow processing;Difficulty sequencing;Requires verbal cues;Requires tactile cues General Comments: pleasant, motivated to get OOB saying "I was wondering when you all were going to get me up". Oriented to self and location, not hip fracture or surgery. Follows one-step commands well with increased time, poor insight into deficits especially poor balance in standing and pivot to recliner.      Exercises General Exercises - Lower Extremity Quad Sets: AAROM;Both;5 reps;Seated (recliner)  Hip ABduction/ADduction: AAROM;Both;10 reps;Seated    General Comments        Pertinent Vitals/Pain Pain Assessment: Faces Faces Pain Scale: Hurts little more Pain Location: R hip Pain Descriptors / Indicators: Grimacing;Sore Pain Intervention(s): Limited activity within patient's tolerance;Monitored during session;Repositioned    Home Living                      Prior Function            PT Goals (current goals can now be found in the care plan section)  Acute Rehab PT Goals Patient Stated Goal: to go home PT Goal Formulation: With patient Time For Goal Achievement: 11/27/20 Potential to Achieve Goals: Fair Progress towards PT goals: Progressing toward goals    Frequency    Min 3X/week      PT Plan Current plan remains appropriate    Co-evaluation              AM-PAC PT "6 Clicks" Mobility   Outcome Measure  Help needed turning from your back to your side while in a flat bed without using bedrails?: A Lot Help needed moving from lying on your back to sitting on the side of a flat bed without using bedrails?: A Lot Help needed moving to and from a bed to a chair (including a wheelchair)?: A Lot Help needed standing up from a chair using your arms (e.g., wheelchair or bedside chair)?: A Lot Help needed to walk in hospital room?: A Lot Help needed climbing 3-5 steps with a railing? : Total 6 Click Score: 11    End of Session Equipment Utilized During Treatment: Gait belt Activity Tolerance: Patient limited by pain Patient left: with call bell/phone within reach;in chair;with chair alarm set Nurse Communication: Mobility status PT Visit Diagnosis: Unsteadiness on feet (R26.81);Muscle weakness (generalized) (M62.81);History of falling (Z91.81);Difficulty in walking, not elsewhere classified (R26.2)     Time: 9449-6759 PT Time Calculation (min) (ACUTE ONLY): 14 min  Charges:  $Therapeutic Activity: 8-22 mins                    Marye Round, PT Acute Rehabilitation Services Pager [phone]  Office [phone]   Tyrone Apple E Christain [place] 11/19/2020, 1:52 PM

## 2020-11-19 NOTE — Care Management Important Message (Signed)
Important Message  Patient Details  Name: Claudia [name] MRN: [id number] Date of Birth: 05/17/1931   Medicare Important Message Given:  Yes     Danyal Whitenack Stefan Church 11/19/2020, 3:47 PM

## 2020-11-20 ENCOUNTER — Emergency Department (HOSPITAL_COMMUNITY): Payer: Medicare Other

## 2020-11-20 ENCOUNTER — Inpatient Hospital Stay (HOSPITAL_COMMUNITY)
Admission: EM | Admit: 2020-11-20 | Discharge: 2020-11-25 | Disposition: A | Discharge status: Skilled Nursing Facility | Payer: Medicare Other | Source: Skilled Nursing Facility | Attending: Internal Medicine | Admitting: Internal Medicine

## 2020-11-20 ENCOUNTER — Other Ambulatory Visit: Payer: Self-pay

## 2020-11-20 DIAGNOSIS — Z66 Do not resuscitate: Secondary | ICD-10-CM | POA: Diagnosis present

## 2020-11-20 DIAGNOSIS — T83518A Infection and inflammatory reaction due to other urinary catheter, initial encounter: Secondary | ICD-10-CM | POA: Diagnosis present

## 2020-11-20 DIAGNOSIS — Z833 Family history of diabetes mellitus: Secondary | ICD-10-CM

## 2020-11-20 DIAGNOSIS — I10 Essential (primary) hypertension: Secondary | ICD-10-CM | POA: Diagnosis present

## 2020-11-20 DIAGNOSIS — R4182 Altered mental status, unspecified: Secondary | ICD-10-CM | POA: Diagnosis present

## 2020-11-20 DIAGNOSIS — I2510 Atherosclerotic heart disease of native coronary artery without angina pectoris: Secondary | ICD-10-CM | POA: Diagnosis present

## 2020-11-20 DIAGNOSIS — R339 Retention of urine, unspecified: Secondary | ICD-10-CM | POA: Diagnosis present

## 2020-11-20 DIAGNOSIS — S72141D Displaced intertrochanteric fracture of right femur, subsequent encounter for closed fracture with routine healing: Secondary | ICD-10-CM

## 2020-11-20 DIAGNOSIS — R404 Transient alteration of awareness: Secondary | ICD-10-CM | POA: Diagnosis not present

## 2020-11-20 DIAGNOSIS — Z20822 Contact with and (suspected) exposure to covid-19: Secondary | ICD-10-CM | POA: Diagnosis present

## 2020-11-20 DIAGNOSIS — Z882 Allergy status to sulfonamides status: Secondary | ICD-10-CM

## 2020-11-20 DIAGNOSIS — Y846 Urinary catheterization as the cause of abnormal reaction of the patient, or of later complication, without mention of misadventure at the time of the procedure: Secondary | ICD-10-CM | POA: Diagnosis present

## 2020-11-20 DIAGNOSIS — N3000 Acute cystitis without hematuria: Secondary | ICD-10-CM | POA: Diagnosis not present

## 2020-11-20 DIAGNOSIS — F39 Unspecified mood [affective] disorder: Secondary | ICD-10-CM | POA: Diagnosis present

## 2020-11-20 DIAGNOSIS — H9190 Unspecified hearing loss, unspecified ear: Secondary | ICD-10-CM | POA: Diagnosis present

## 2020-11-20 DIAGNOSIS — N3001 Acute cystitis with hematuria: Secondary | ICD-10-CM | POA: Diagnosis present

## 2020-11-20 DIAGNOSIS — Z87440 Personal history of urinary (tract) infections: Secondary | ICD-10-CM

## 2020-11-20 DIAGNOSIS — G928 Other toxic encephalopathy: Secondary | ICD-10-CM | POA: Diagnosis present

## 2020-11-20 DIAGNOSIS — R531 Weakness: Secondary | ICD-10-CM

## 2020-11-20 DIAGNOSIS — Z789 Other specified health status: Secondary | ICD-10-CM | POA: Diagnosis present

## 2020-11-20 DIAGNOSIS — E785 Hyperlipidemia, unspecified: Secondary | ICD-10-CM | POA: Diagnosis not present

## 2020-11-20 DIAGNOSIS — Z79891 Long term (current) use of opiate analgesic: Secondary | ICD-10-CM

## 2020-11-20 DIAGNOSIS — M549 Dorsalgia, unspecified: Secondary | ICD-10-CM | POA: Diagnosis present

## 2020-11-20 DIAGNOSIS — Z886 Allergy status to analgesic agent status: Secondary | ICD-10-CM

## 2020-11-20 DIAGNOSIS — B3741 Candidal cystitis and urethritis: Secondary | ICD-10-CM | POA: Diagnosis present

## 2020-11-20 DIAGNOSIS — K219 Gastro-esophageal reflux disease without esophagitis: Secondary | ICD-10-CM | POA: Diagnosis present

## 2020-11-20 DIAGNOSIS — Z8249 Family history of ischemic heart disease and other diseases of the circulatory system: Secondary | ICD-10-CM

## 2020-11-20 DIAGNOSIS — W1830XD Fall on same level, unspecified, subsequent encounter: Secondary | ICD-10-CM

## 2020-11-20 DIAGNOSIS — E876 Hypokalemia: Secondary | ICD-10-CM | POA: Diagnosis present

## 2020-11-20 DIAGNOSIS — Z888 Allergy status to other drugs, medicaments and biological substances status: Secondary | ICD-10-CM

## 2020-11-20 DIAGNOSIS — G894 Chronic pain syndrome: Secondary | ICD-10-CM | POA: Diagnosis present

## 2020-11-20 LAB — URINALYSIS, ROUTINE W REFLEX MICROSCOPIC
Bilirubin Urine: NEGATIVE
Glucose, UA: NEGATIVE mg/dL
Hgb urine dipstick: NEGATIVE
Ketones, ur: NEGATIVE mg/dL
Nitrite: NEGATIVE
Protein, ur: NEGATIVE mg/dL
Specific Gravity, Urine: 1.018 (ref 1.005–1.030)
pH: 8 (ref 5.0–8.0)

## 2020-11-20 LAB — CBC WITH DIFFERENTIAL/PLATELET
Abs Immature Granulocytes: 0.05 10*3/uL (ref 0.00–0.07)
Basophils Absolute: 0 10*3/uL (ref 0.0–0.1)
Basophils Relative: 0 %
Eosinophils Absolute: 0.2 10*3/uL (ref 0.0–0.5)
Eosinophils Relative: 2 %
HCT: 34.1 % — ABNORMAL LOW (ref 36.0–46.0)
Hemoglobin: 10.5 g/dL — ABNORMAL LOW (ref 12.0–15.0)
Immature Granulocytes: 1 %
Lymphocytes Relative: 11 %
Lymphs Abs: 1 10*3/uL (ref 0.7–4.0)
MCH: 28.5 pg (ref 26.0–34.0)
MCHC: 30.8 g/dL (ref 30.0–36.0)
MCV: 92.4 fL (ref 80.0–100.0)
Monocytes Absolute: 1.1 10*3/uL — ABNORMAL HIGH (ref 0.1–1.0)
Monocytes Relative: 11 %
Neutro Abs: 7.3 10*3/uL (ref 1.7–7.7)
Neutrophils Relative %: 75 %
Platelets: 364 10*3/uL (ref 150–400)
RBC: 3.69 MIL/uL — ABNORMAL LOW (ref 3.87–5.11)
RDW: 15.8 % — ABNORMAL HIGH (ref 11.5–15.5)
WBC: 9.7 10*3/uL (ref 4.0–10.5)
nRBC: 0 % (ref 0.0–0.2)

## 2020-11-20 LAB — COMPREHENSIVE METABOLIC PANEL
ALT: 17 U/L (ref 0–44)
AST: 22 U/L (ref 15–41)
Albumin: 2.6 g/dL — ABNORMAL LOW (ref 3.5–5.0)
Alkaline Phosphatase: 56 U/L (ref 38–126)
Anion gap: 10 (ref 5–15)
BUN: 18 mg/dL (ref 8–23)
CO2: 30 mmol/L (ref 22–32)
Calcium: 8.6 mg/dL — ABNORMAL LOW (ref 8.9–10.3)
Chloride: 97 mmol/L — ABNORMAL LOW (ref 98–111)
Creatinine, Ser: 0.74 mg/dL (ref 0.44–1.00)
GFR, Estimated: >60 mL/min (ref >60)
Glucose, Bld: 109 mg/dL — ABNORMAL HIGH (ref 70–99)
Potassium: 3.6 mmol/L (ref 3.5–5.1)
Sodium: 137 mmol/L (ref 135–145)
Total Bilirubin: 0.6 mg/dL (ref 0.3–1.2)
Total Protein: 5.6 g/dL — ABNORMAL LOW (ref 6.5–8.1)

## 2020-11-20 LAB — AMMONIA: Ammonia: <9 umol/L — ABNORMAL LOW (ref 9–35)

## 2020-11-20 LAB — CBG MONITORING, ED: Glucose-Capillary: 113 mg/dL — ABNORMAL HIGH (ref 70–99)

## 2020-11-20 LAB — RESP PANEL BY RT-PCR (FLU A&B, COVID) ARPGX2
Influenza A by PCR: NEGATIVE
Influenza B by PCR: NEGATIVE
SARS Coronavirus 2 by RT PCR: NEGATIVE

## 2020-11-20 LAB — LACTIC ACID, PLASMA: Lactic Acid, Venous: 0.9 mmol/L (ref 0.5–1.9)

## 2020-11-20 MED ORDER — ACETAMINOPHEN 325 MG PO TABS
650.0000 mg | ORAL_TABLET | Freq: Once | ORAL | Status: AC
  Administered 2020-11-20: 650 mg via ORAL
  Filled 2020-11-20: qty 2

## 2020-11-20 MED ORDER — SODIUM CHLORIDE 0.9 % IV SOLN
1.0000 g | Freq: Once | INTRAVENOUS | Status: AC
  Administered 2020-11-20: 1 g via INTRAVENOUS
  Filled 2020-11-20: qty 10

## 2020-11-20 NOTE — Progress Notes (Addendum)
Pt discharge to [facility] via [facility], pt's personal belongings and home meds given to transport personel.

## 2020-11-20 NOTE — ED Provider Notes (Signed)
Care assumed at shift change from [facility], pending remaining laboratory results and anticipated admission for altered mental status.  HPI below per previous provider:  "Claudia [name] is a 87 y.o. female with a past medical history of chronic back pain who has been on opiate pain management for many years, CAD, CHF, recent intertrochanteric right hip fracture with IM pinning by Dr. Carola Frost who was discharged this morning at 4:00 AM to [facility] skilled nursing facility who was brought back in today for acute altered mental status. History is gathered by EMS at bedside, EMR, and by phone call with the patient's daughter.  Patient states that prior to her fall she was living independently and had only minor intermittent issues with confusion or memory.  She was noted to be confused at the time of her evaluation in the emergency department but was alert and responsive and able to provide history at that time.  Patient's daughter states that she saw her yesterday at 3:30 PM while she was getting bathed and changed by hospital staff and had her fentanyl patch replaced.  She was alert and "fussing at the hospital staff."  This morning the patient's daughter reports she went to sign paperwork at the skilled nursing facility, went to her room to visit her and found her completely unresponsive.  She states that the only thing her mother would do was stare at her.  She was not making any kind of verbal response and would not follow any commands.  No one at the skilled nursing facility was able to tell her daughter what her condition was at transfer.  Her daughter states that this is completely abnormal and she has never been like this before.  She has been on the narcotic pain medication for years and she does not feel that this contributed to her change in mental status.  She does report that about 4 days prior to the fall causing her ER visit she was having some confusion and has been scheduled for outpatient follow-up  with neurology in May."  Physical Exam  BP 123/70   Pulse 77   Temp 97.7 F (36.5 C)   Resp 16   SpO2 97%   Physical Exam Vitals and nursing note reviewed.  Constitutional:      Appearance: She is well-developed and well-nourished.  HENT:     Head: Normocephalic.  Eyes:     Conjunctiva/sclera: Conjunctivae normal.  Cardiovascular:     Rate and Rhythm: Normal rate.  Pulmonary:     Effort: Pulmonary effort is normal.  Skin:    General: Skin is warm.  Neurological:     Mental Status: She is alert.  Psychiatric:        Mood and Affect: Mood and affect normal.        Behavior: Behavior normal.     ED Course/Procedures   Clinical Course as of 11/20/20 1851  Sat Nov 20, 2020  1712 Patient evaluated, blood work-up is overall unremarkable.  Pending ammonia level, lactic acid, and UA.  She appears more awake alert on evaluation.  She is interactive and conversing.  She is unsure of the events of earlier today though is able to tell me that she is at [facility] and just had surgery on her hip.  She is following simple commands. [JR]    Clinical Course User Index [JR] Tarrie Mcmichen, [place] N, PA-C    Procedures   MDM   6:51 PM Discussed with hospitalist Dr. Mikeal Hawthorne.  Plan to  order dose of Rocephin to treat urine.  Patient will be admitted to the medicine service for further management.    Milayah Krell, [place] N, PA-C 11/20/20 2309    Eber Hong, MD 11/22/20 [phone]

## 2020-11-20 NOTE — ED Triage Notes (Signed)
Pt BIB [facility] from [facility] for AMS. Pt was found unresponsive after supposedly eating breakfast. Pt is awake on arrival to ED, however is nonverbal. Pupils are pinpoint. Pt does not follow commands nor does she respond to verbal or painful stimuli. Fentanyl patch present on right chest. Foley and IV access in place on arrival.

## 2020-11-20 NOTE — H&P (Signed)
History and Physical   Claudia [name] WVP:[id number] DOB: [birth year] DOA: 11/20/2020  Referring [name]/NP/PA: [place] Robinson, PA  PCP: Claudia Broker, [name]   Outpatient Specialists: Dr. Carola [name]  Patient coming from: Home  Chief Complaint: Altered mental status  HPI: Claudia [name] is a 87 y.o. female with medical history significant of coronary artery disease, deafness pressure CHF, recent intertrochanteric right hip fracture with IM pain performed by Dr. Carola [name], chronic pain syndrome with fentanyl patch in place, hypertension, GERD, hyperlipidemia, who was discharged to [facility] due to skilled nursing facility only yesterday.  Patient returned today secondary to altered mental status.  Patient's daughter apparently visited her and found that she was staring into space.  She is not communicating adequately with her.  Her medications have not changed from hospital medications.  She had acute urinary retention recently while in the hospital and had several in and out catheterizations.  She has had previous UTIs.  Also has had acute blood loss anemia.  Patient was subsequently admitted to the hospital now with new altered mental status.  Her urinalysis shows possible early UTI but not extremely impressive.  Prior to discharge to skilled facility if patient voids her reluctance and given refusal to go.  Daughter resumed decision making due to determination that she lacks cognition and capacity.  Not sure if patient is depressed as a result.  She is currently not able to give me much history except complaining of pain.  She has a fentanyl patch in place but this is not new..  ED Course: Temperature is 99.3 blood pressure 109/54 pulse 90 respirate 21 oxygen sat 95% room air.  Urinalysis showed trace leukocytes WBC 11-25 bacteria.  Negative nitrites.  Head CT without contrast negative chest x-ray negative.  CBC and chemistry are unchanged and stable.  Urine cultures obtained and patient being admitted to the  hospital for observation as a result of altered mental status  Review of Systems: As per HPI otherwise 10 point review of systems negative.    Past Medical History:  Diagnosis Date  . Back pain   . Cataracts, both eyes   . CHF (congestive heart failure) (HCC)   . Chronic narcotic use   . Chronic pain   . Coronary artery disease   . Hyperlipidemia   . Hypertension   . Mitral valve prolapse   . Osteoarthritis   . Osteoporosis   . Pain management   . Reflux     Past Surgical History:  Procedure Laterality Date  . ABDOMINAL HYSTERECTOMY    . APPENDECTOMY    . BLADDER SURGERY    . BREAST SURGERY    . FEMUR IM NAIL Right 11/12/2020   Procedure: INTRAMEDULLARY (IM) NAIL FEMORAL;  Surgeon: Myrene Claudia [name];  Location: [facility] OR;  Service: Orthopedics;  Laterality: Right;  . SHOULDER SURGERY     Right     reports that she has never smoked. She has never used smokeless tobacco. She reports that she does not drink alcohol and does not use drugs.  Allergies  Allergen Reactions  . Amitiza [Lubiprostone] Nausea And Vomiting  . Benadryl [Diphenhydramine] Other (See Comments)    Makes me hyper  . Ultram [Tramadol] Other (See Comments)    Made sick to stomach  . Aspirin Other (See Comments)    Upsets stomach, and nervousness. "I feel like I'm going to scream."  . Hm Lidocaine Patch [Lidocaine] Rash  . Ibuprofen Other (See Comments)    Jittery, nervousness, similar to  aspirin  . Sulfa Antibiotics Nausea And Vomiting and Rash    Family History  Problem Relation Age of Onset  . Hypertension Mother   . Diabetes Mother   . Heart disease Father   . Hypertension Father   . Diabetes Father      Prior to Admission medications   Medication Sig Start Date End Date Taking? Authorizing Provider  acetaminophen (TYLENOL) 500 MG tablet Take 500 mg by mouth in the morning and at bedtime.   Yes Provider, Historical, [name]  diltiazem (CARDIZEM CD) 120 MG 24 hr capsule Take 2 capsules (240 mg  total) by mouth every evening. 11/18/20  Yes Glade Lloyd, [name]  docusate sodium 100 MG CAPS Take 100 mg by mouth daily. 12/15/13  Yes Polite, Claudia [name]  fentaNYL (DURAGESIC) 100 MCG/HR Place 1 patch onto the skin every 3 (three) days. 11/18/20  Yes Glade Lloyd, [name]  mirtazapine (REMERON) 15 MG tablet Take 1 tablet (15 mg total) by mouth at bedtime. 08/20/20  Yes Claudia Broker, [name]  polyethylene glycol powder (GLYCOLAX/MIRALAX) 17 GM/SCOOP powder Take 17 g by mouth daily as needed for mild constipation. 11/18/20  Yes Glade Lloyd, [name]  simvastatin (ZOCOR) 20 MG tablet Take 1 tablet (20 mg total) by mouth every evening. 08/20/20  Yes Claudia Broker, [name]  triamterene-hydrochlorothiazide (MAXZIDE-25) 37.5-25 MG tablet Take 1 tablet by mouth daily. 08/20/20  Yes Claudia Broker, [name]  XIIDRA 5 % SOLN Place 1 drop into both eyes in the morning and at bedtime. 08/02/20  Yes Claudia [name]  pantoprazole (PROTONIX) 40 MG tablet TAKE 1 TABLET EACH DAY. Patient not taking: Reported on 11/20/2020 08/20/20   Claudia Broker, [name]    Physical Exam: Vitals:   11/20/20 1845 11/20/20 1900 11/20/20 1915 11/20/20 1952  BP: (!) 129/93 (!) 123/59 117/75   Pulse: 81 85 85   Resp: Temp:    99.1 F (37.3 C)  TempSrc:    Oral  SpO2: 98% 96% 100%       Constitutional: Calm, withdrawn, depressed no distress Vitals:   11/20/20 1845 11/20/20 1900 11/20/20 1915 11/20/20 1952  BP: (!) 129/93 (!) 123/59 117/75   Pulse: 81 85 85   Resp: Temp:    99.1 F (37.3 C)  TempSrc:    Oral  SpO2: 98% 96% 100%    Eyes: PERRL, lids and conjunctivae normal ENMT: Mucous membranes are dry. Posterior pharynx clear of any exudate or lesions.Normal dentition.  Neck: normal, supple, no masses, no thyromegaly Respiratory: clear to auscultation bilaterally, no wheezing, no crackles. Normal respiratory effort. No accessory muscle use.  Cardiovascular: Regular rate and rhythm, no murmurs /  rubs / gallops. No extremity edema. 2+ pedal pulses. No carotid bruits.  Abdomen: no tenderness, no masses palpated. No hepatosplenomegaly. Bowel sounds positive.  Musculoskeletal: no clubbing / cyanosis. No joint deformity upper and lower extremities. Good ROM, no contractures. Normal muscle tone.  Skin: no rashes, lesions, ulcers. No induration Neurologic: CN 2-12 grossly intact. Sensation intact, DTR normal. Strength 5/5 in all 4.  Psychiatric: Awake and alert, depressed mood, not communicating much..     Labs on Admission: I have personally reviewed following labs and imaging studies  CBC: Recent Labs  Lab 11/14/20 0127 11/14/20 0845 11/15/20 0243 11/15/20 1746 11/16/20 0908 11/16/20 1828 11/18/20 0046 11/20/20 1320  WBC 9.9  --  6.9  --  5.5  --  6.2 9.7  NEUTROABS  --   --   --   --   --   --   --  7.3  HGB 6.2*   < > 7.2* 7.7* 7.3* 10.5* 9.3* 10.5*  HCT 18.2*   < > 22.2* 24.5* 23.2* 32.5* 27.3* 34.1*  MCV 85.8  --  85.7  --  87.9  --  86.1 92.4  PLT 157  --  162  --  202  --  245 364   < > = values in this interval not displayed.   Basic Metabolic Panel: Recent Labs  Lab 11/14/20 0127 11/15/20 1107 11/20/20 1320  NA 136 138 137  K 3.5 3.5 3.6  CL 100 98 97*  CO2 GLUCOSE 131* 109* 109*  BUN CREATININE 0.94 0.79 0.74  CALCIUM 7.8* 8.0* 8.6*   GFR: Estimated Creatinine Clearance: 33.2 mL/min (by C-G formula based on SCr of 0.74 mg/dL). Liver Function Tests: Recent Labs  Lab 11/20/20 1320  AST 22  ALT 17  ALKPHOS 56  BILITOT 0.6  PROT 5.6*  ALBUMIN 2.6*   No results for input(s): LIPASE, AMYLASE in the last 168 hours. Recent Labs  Lab 11/20/20 1734  AMMONIA <9*   Coagulation Profile: No results for input(s): INR, PROTIME in the last 168 hours. Cardiac Enzymes: No results for input(s): CKTOTAL, CKMB, CKMBINDEX, TROPONINI in the last 168 hours. BNP (last 3 results) No results for input(s): PROBNP in the last 8760  hours. HbA1C: No results for input(s): HGBA1C in the last 72 hours. CBG: Recent Labs  Lab 11/20/20 1309  GLUCAP 113*   Lipid Profile: No results for input(s): CHOL, HDL, LDLCALC, TRIG, CHOLHDL, LDLDIRECT in the last 72 hours. Thyroid Function Tests: No results for input(s): TSH, T4TOTAL, FREET4, T3FREE, THYROIDAB in the last 72 hours. Anemia Panel: No results for input(s): VITAMINB12, FOLATE, FERRITIN, TIBC, IRON, RETICCTPCT in the last 72 hours. Urine analysis:    Component Value Date/Time   COLORURINE YELLOW 11/20/2020 1652   APPEARANCEUR HAZY (A) 11/20/2020 1652   LABSPEC 1.018 11/20/2020 1652   PHURINE 8.0 11/20/2020 1652   GLUCOSEU NEGATIVE 11/20/2020 1652   GLUCOSEU NEGATIVE 09/07/2020 1411   HGBUR NEGATIVE 11/20/2020 1652   BILIRUBINUR NEGATIVE 11/20/2020 1652   BILIRUBINUR Neg 12/16/2018 1328   KETONESUR NEGATIVE 11/20/2020 1652   PROTEINUR NEGATIVE 11/20/2020 1652   UROBILINOGEN 0.2 09/07/2020 1411   NITRITE NEGATIVE 11/20/2020 1652   LEUKOCYTESUR TRACE (A) 11/20/2020 1652   Sepsis Labs: (procalcitonin:4,lacticidven:4) ) Recent Results (from the past 240 hour(s))  SARS CORONAVIRUS 2 (TAT 6-24 HRS) Nasopharyngeal Nasopharyngeal Swab     Status: None   Collection Time: 11/11/20  5:29 PM   Specimen: Nasopharyngeal Swab  Result Value Ref Range Status   SARS Coronavirus 2 NEGATIVE NEGATIVE Final    Comment: (NOTE) SARS-CoV-2 target nucleic acids are NOT DETECTED.  The SARS-CoV-2 RNA is generally detectable in upper and lower respiratory specimens during the acute phase of infection. Negative results do not preclude SARS-CoV-2 infection, do not rule out co-infections with other pathogens, and should not be used as the sole basis for treatment or other patient management decisions. Negative results must be combined with clinical observations, patient history, and epidemiological information. The expected result is Negative.  Fact Sheet for  Patients: HairSlick.no  Fact Sheet for Healthcare Providers: quierodirigir.com  This test is not yet approved or cleared by the [place] FDA and  has been authorized for detection and/or diagnosis of SARS-CoV-2 by FDA under an Emergency Use Authorization (EUA). This EUA will remain  in effect (meaning this test can be used)  for the duration of the COVID-19 declaration under Se ction 564(b)(1) of the Act, 21 U.S.C. section 360bbb-3(b)(1), unless the authorization is terminated or revoked sooner.  Performed at [facility], [address] [place], [place] [address]   Surgical pcr screen     Status: None   Collection Time: 11/12/20 12:38 PM   Specimen: Nasal Mucosa; Nasal Swab  Result Value Ref Range Status   MRSA, PCR NEGATIVE NEGATIVE Final   Staphylococcus aureus NEGATIVE NEGATIVE Final    Comment: (NOTE) The Xpert SA Assay (FDA approved for NASAL specimens in patients 63 years of age and older), is one component of a comprehensive surveillance program. It is not intended to diagnose infection nor to guide or monitor treatment. Performed at [facility], [address], [place], [place] [address]   SARS CORONAVIRUS 2 (TAT 6-24 HRS) Nasopharyngeal Nasopharyngeal Swab     Status: None   Collection Time: 11/17/20  4:42 PM   Specimen: Nasopharyngeal Swab  Result Value Ref Range Status   SARS Coronavirus 2 NEGATIVE NEGATIVE Final    Comment: (NOTE) SARS-CoV-2 target nucleic acids are NOT DETECTED.  The SARS-CoV-2 RNA is generally detectable in upper and lower respiratory specimens during the acute phase of infection. Negative results do not preclude SARS-CoV-2 infection, do not rule out co-infections with other pathogens, and should not be used as the sole basis for treatment or other patient management decisions. Negative results must be combined with clinical observations, patient history, and  epidemiological information. The expected result is Negative.  Fact Sheet for Patients: HairSlick.no  Fact Sheet for Healthcare Providers: quierodirigir.com  This test is not yet approved or cleared by the [place] FDA and  has been authorized for detection and/or diagnosis of SARS-CoV-2 by FDA under an Emergency Use Authorization (EUA). This EUA will remain  in effect (meaning this test can be used) for the duration of the COVID-19 declaration under Se ction 564(b)(1) of the Act, 21 U.S.C. section 360bbb-3(b)(1), unless the authorization is terminated or revoked sooner.  Performed at [facility], [address], [place], [place] [address]   Resp Panel by RT-PCR (Flu A&B, Covid) Nasopharyngeal Swab     Status: None   Collection Time: 11/20/20  1:11 PM   Specimen: Nasopharyngeal Swab; Nasopharyngeal(NP) swabs in vial transport medium  Result Value Ref Range Status   SARS Coronavirus 2 by RT PCR NEGATIVE NEGATIVE Final    Comment: (NOTE) SARS-CoV-2 target nucleic acids are NOT DETECTED.  The SARS-CoV-2 RNA is generally detectable in upper respiratory specimens during the acute phase of infection. The lowest concentration of SARS-CoV-2 viral copies this assay can detect is 138 copies/mL. A negative result does not preclude SARS-Cov-2 infection and should not be used as the sole basis for treatment or other patient management decisions. A negative result may occur with  improper specimen collection/handling, submission of specimen other than nasopharyngeal swab, presence of viral mutation(s) within the areas targeted by this assay, and inadequate number of viral copies(<138 copies/mL). A negative result must be combined with clinical observations, patient history, and epidemiological information. The expected result is Negative.  Fact Sheet for Patients:  BloggerCourse.com  Fact Sheet for  Healthcare Providers:  SeriousBroker.it  This test is no t yet approved or cleared by the [place] FDA and  has been authorized for detection and/or diagnosis of SARS-CoV-2 by FDA under an Emergency Use Authorization (EUA). This EUA will remain  in effect (meaning this test can  be used) for the duration of the COVID-19 declaration under Section 564(b)(1) of the Act, 21 U.S.C.section 360bbb-3(b)(1), unless the authorization is terminated  or revoked sooner.       Influenza A by PCR NEGATIVE NEGATIVE Final   Influenza B by PCR NEGATIVE NEGATIVE Final    Comment: (NOTE) The Xpert Xpress SARS-CoV-2/FLU/RSV plus assay is intended as an aid in the diagnosis of influenza from Nasopharyngeal swab specimens and should not be used as a sole basis for treatment. Nasal washings and aspirates are unacceptable for Xpert Xpress SARS-CoV-2/FLU/RSV testing.  Fact Sheet for Patients: BloggerCourse.com  Fact Sheet for Healthcare Providers: SeriousBroker.it  This test is not yet approved or cleared by the [place] FDA and has been authorized for detection and/or diagnosis of SARS-CoV-2 by FDA under an Emergency Use Authorization (EUA). This EUA will remain in effect (meaning this test can be used) for the duration of the COVID-19 declaration under Section 564(b)(1) of the Act, 21 U.S.C. section 360bbb-3(b)(1), unless the authorization is terminated or revoked.  Performed at [facility], [address], [place], [place] [address]      Radiological Exams on Admission: CT Head Wo Contrast  Result Date: 11/20/2020 CLINICAL DATA:  Altered mental status.  Found unresponsive. EXAM: CT HEAD WITHOUT CONTRAST TECHNIQUE: Contiguous axial images were obtained from the base of the skull through the vertex without intravenous contrast. COMPARISON:  MR brain dated August 30, 2020. CT head dated August 10, 2020.  FINDINGS: Brain: No evidence of acute infarction, hemorrhage, hydrocephalus, extra-axial collection or mass lesion/mass effect. Stable moderate atrophy and chronic microvascular ischemic changes. Vascular: Calcified atherosclerosis at the skullbase. No hyperdense vessel. Skull: Normal. Negative for fracture or focal lesion. Sinuses/Orbits: No acute finding. Other: None. IMPRESSION: 1. No acute intracranial abnormality. 2. Stable moderate atrophy and chronic microvascular ischemic changes. Electronically Signed   By: Obie Dredge M.D.   On: 11/20/2020 16:04   DG Chest Portable 1 View  Result Date: 11/20/2020 CLINICAL DATA:  Altered mental status EXAM: PORTABLE CHEST 1 VIEW COMPARISON:  11/11/2020 FINDINGS: The heart is normal in size. Unchanged prominence of the aorta. Both lungs are clear. The visualized skeletal structures are unremarkable. IMPRESSION: 1. No acute abnormality of the lungs. 2. Unchanged prominence of the aorta, suggesting aortic aneurysm. Recommend follow-up CT angiogram of the chest on a nonemergent basis in the absence of acutely referable signs and symptoms. Electronically Signed   By: Lauralyn Primes M.D.   On: 11/20/2020 14:09      Assessment/Plan Principal Problem:   AMS (altered mental status) Active Problems:   Hypertension   Hyperlipidemia   Weakness   Acute cystitis without hematuria   DNR (do not resuscitate)   DNI (do not intubate)     #1 altered mental status: Appears to be transient delirium.  Patient is still confused.  Not sure if this is a component of withdrawal as a result of depression as patient did not want to go to skilled facility in the first place.  It could also be early UTI since she has had recent in and out catheterization.  She does have some hematuria and leukocytosis.  We will treat her for the UTI.  Monitor closely.  If mental status returns to normal she will be discharged back to skilled facility tomorrow.  #2  Recent hip fracture: Continue  with pain management.  I do not think her family has anything to do with mental status change.  Is been used in  the hospital and she has been fine prior to this.  #3 possible UTI: Continue treatment as above  #4 essential hypertension: Continue home regimen  #5 hyperlipidemia: Continue with statin  #6 generalized weakness: PT OT on return to skilled facility   DVT prophylaxis: Heparin Code Status: DNR Family Communication: Daughter Disposition Plan: Back to skilled facility Consults called: None Admission status: Observation  Severity of Illness: The appropriate patient status for this patient is OBSERVATION. Observation status is judged to be reasonable and necessary in order to provide the required intensity of service to ensure the patient's safety. The patient's presenting symptoms, physical exam findings, and initial radiographic and laboratory data in the context of their medical condition is felt to place them at decreased risk for further clinical deterioration. Furthermore, it is anticipated that the patient will be medically stable for discharge from the hospital within 2 midnights of admission. The following factors support the patient status of observation.   " The patient's presenting symptoms include altered mental status. " The physical exam findings include withdrawn and depressed, no distress. " The initial radiographic and laboratory data are possible UTI.     Lonia BloodGARBA,LAWAL [name] [facility] Pager [phone]  If 7PM-7AM, please contact night-coverage [email or web address] [email or web address]  11/20/2020, 10:38 PM

## 2020-11-20 NOTE — ED Notes (Signed)
Dressing present to right thigh. Sutures intact. No current drainage noted.

## 2020-11-20 NOTE — ED Provider Notes (Signed)
[facility] EMERGENCY DEPARTMENT Provider Note   CSN: [id number] Arrival date & time: 11/20/20  1256     History No chief complaint on file.   Claudia [name] is a 87 y.o. female with a past medical history of chronic back pain who has been on opiate pain management for many years, CAD, CHF, recent intertrochanteric right hip fracture with IM pinning by Dr. Carola Frost who was discharged this morning at 4:00 AM to [facility] skilled nursing facility who was brought back in today for acute altered mental status. History is gathered by EMS at bedside, EMR, and by phone call with the patient's daughter.  Patient states that prior to her fall she was living independently and had only minor intermittent issues with confusion or memory.  She was noted to be confused at the time of her evaluation in the emergency department but was alert and responsive and able to provide history at that time.  Patient's daughter states that she saw her yesterday at 3:30 PM while she was getting bathed and changed by hospital staff and had her fentanyl patch replaced.  She was alert and "fussing at the hospital staff."  This morning the patient's daughter reports she went to sign paperwork at the skilled nursing facility, went to her room to visit her and found her completely unresponsive.  She states that the only thing her mother would do was stare at her.  She was not making any kind of verbal response and would not follow any commands.  No one at the skilled nursing facility was able to tell her daughter what her condition was at transfer.  Her daughter states that this is completely abnormal and she has never been like this before.  She has been on the narcotic pain medication for years and she does not feel that this contributed to her change in mental status.  She does report that about 4 days prior to the fall causing her ER visit she was having some confusion and has been scheduled for outpatient follow-up  with neurology in May.  HPI     Past Medical History:  Diagnosis Date  . Back pain   . Cataracts, both eyes   . CHF (congestive heart failure) (HCC)   . Chronic narcotic use   . Chronic pain   . Coronary artery disease   . Hyperlipidemia   . Hypertension   . Mitral valve prolapse   . Osteoarthritis   . Osteoporosis   . Pain management   . Reflux     Patient Active Problem List   Diagnosis Date Noted  . Closed comminuted intertrochanteric fracture of proximal femur, right, initial encounter (HCC) 11/11/2020  . Closed right hip fracture (HCC) 11/11/2020  . Pain   . Palliative care by specialist   . Goals of care, counseling/discussion   . DNR (do not resuscitate)   . DNI (do not intubate)   . Encounter for hospice care discussion   . Fall at home, initial encounter 07/28/2020  . Fall 07/28/2020  . Weakness   . Closed fracture of nasal bones   . Acute cystitis without hematuria   . Pain management contract signed 04/08/2020  . Protein-calorie malnutrition, severe (HCC) 03/11/2020  . Hyperlipidemia 12/20/2017  . Routine general medical examination at a health care facility 12/19/2016  . Insomnia 06/19/2015  . Narcotic dependence (HCC) 12/13/2013  . Chronic back pain 12/13/2013  . Hypertension     Past Surgical History:  Procedure Laterality Date  .  ABDOMINAL HYSTERECTOMY    . APPENDECTOMY    . BLADDER SURGERY    . BREAST SURGERY    . FEMUR IM NAIL Right 11/12/2020   Procedure: INTRAMEDULLARY (IM) NAIL FEMORAL;  Surgeon: [name], MD;  Location: [facility] OR;  Service: Orthopedics;  Laterality: Right;  . SHOULDER SURGERY     Right     OB History   No obstetric history on file.     Family History  Problem Relation Age of Onset  . Hypertension Mother   . Diabetes Mother   . Heart disease Father   . Hypertension Father   . Diabetes Father     Social History   Tobacco Use  . Smoking status: Never Smoker  . Smokeless tobacco: Never Used  Vaping Use   . Vaping Use: Never used  Substance Use Topics  . Alcohol use: No    Alcohol/week: 0.0 standard drinks  . Drug use: No    Home Medications Prior to Admission medications   Medication Sig Start Date End Date Taking? Authorizing Provider  acetaminophen (TYLENOL) 500 MG tablet Take 500 mg by mouth in the morning and at bedtime.    [provider]  diltiazem (CARDIZEM CD) 120 MG 24 hr capsule Take 2 capsules (240 mg total) by mouth every evening. 11/18/20   Glade Lloyd, MD  docusate sodium 100 MG CAPS Take 100 mg by mouth daily. Patient taking differently: Take 100-200 mg by mouth daily as needed (constipation). 12/15/13   Renford Dills, MD  fentaNYL (DURAGESIC) 100 MCG/HR Place 1 patch onto the skin every 3 (three) days. 11/18/20   Glade Lloyd, MD  mirtazapine (REMERON) 15 MG tablet Take 1 tablet (15 mg total) by mouth at bedtime. 08/20/20   [name], MD  pantoprazole (PROTONIX) 40 MG tablet TAKE 1 TABLET EACH DAY. Patient taking differently: Take 40 mg by mouth daily. 08/20/20   [name], MD  polyethylene glycol powder (GLYCOLAX/MIRALAX) 17 GM/SCOOP powder Take 17 g by mouth daily as needed for mild constipation. 11/18/20   Glade Lloyd, MD  simvastatin (ZOCOR) 20 MG tablet Take 1 tablet (20 mg total) by mouth every evening. 08/20/20   [name], MD  triamterene-hydrochlorothiazide (MAXZIDE-25) 37.5-25 MG tablet Take 1 tablet by mouth daily. 08/20/20   [name], MD  XIIDRA 5 % SOLN Place 1 drop into both eyes in the morning and at bedtime. 08/02/20   Lanae Boast, MD    Allergies    Amitiza [lubiprostone], Benadryl [diphenhydramine], Ultram [tramadol], Aspirin, Hm lidocaine patch [lidocaine], Ibuprofen, and Sulfa antibiotics  Review of Systems   Review of Systems  Unable to perform ROS: Mental status change    Physical Exam Updated Vital Signs There were no vitals taken for this visit.  Physical Exam Vitals and nursing  note reviewed.  Constitutional:      General: She is not in acute distress.    Appearance: She is well-developed and well-nourished. She is not diaphoretic.  HENT:     Head: Normocephalic and atraumatic.  Eyes:     General: No scleral icterus.    Conjunctiva/sclera: Conjunctivae normal.  Cardiovascular:     Rate and Rhythm: Normal rate and regular rhythm.     Heart sounds: Normal heart sounds. No murmur heard. No friction rub. No gallop.   Pulmonary:     Effort: Pulmonary effort is normal. No respiratory distress.     Breath sounds: Normal breath sounds.  Abdominal:     General:  Bowel sounds are normal. There is no distension.     Palpations: Abdomen is soft. There is no mass.     Tenderness: There is no abdominal tenderness. There is no guarding.  Musculoskeletal:     Cervical back: Normal range of motion.  Skin:    General: Skin is warm and dry.  Neurological:     Mental Status: She is alert.     GCS: GCS eye subscore is 4. GCS verbal subscore is 1. GCS motor subscore is 4.     Cranial Nerves: No facial asymmetry.  Psychiatric:        Behavior: Behavior normal.     ED Results / Procedures / Treatments   Labs (all labs ordered are listed, but only abnormal results are displayed) Labs Reviewed  CBG MONITORING, ED - Abnormal; Notable for the following components:      Result Value   Glucose-Capillary 113 (*)    All other components within normal limits  RESP PANEL BY RT-PCR (FLU A&B, COVID) ARPGX2  CBC WITH DIFFERENTIAL/PLATELET  COMPREHENSIVE METABOLIC PANEL  URINALYSIS, ROUTINE W REFLEX MICROSCOPIC  AMMONIA  LACTIC ACID, PLASMA    EKG None  Radiology No results found.  Procedures Procedures   Medications Ordered in ED Medications - No data to display  ED Course  I have reviewed the triage vital signs and the nursing notes.  Pertinent labs & imaging results that were available during my care of the patient were reviewed by me and considered in my  medical decision making (see chart for details).    MDM Rules/Calculators/A&P                          87 year old female here with acute change in her mental status. The differential diagnosis for AMS is extensive and includes, but is not limited to: drug overdose - opioids, alcohol, sedatives, antipsychotics, drug withdrawal, others; Metabolic: hypoxia, hypoglycemia, hyperglycemia, hypercalcemia, hypernatremia, hyponatremia, uremia, hepatic encephalopathy, hypothyroidism, hyperthyroidism, vitamin B12 or thiamine deficiency, carbon monoxide poisoning, Wilson's disease, Lactic acidosis, DKA/HHOS; Infectious: meningitis, encephalitis, bacteremia/sepsis, urinary tract infection, pneumonia, neurosyphilis; Structural: Space-occupying lesion, (brain tumor, subdural hematoma, hydrocephalus,); Vascular: stroke, subarachnoid hemorrhage, coronary ischemia, hypertensive encephalopathy, CNS vasculitis, thrombotic thrombocytopenic purpura, disseminated intravascular coagulation, hyperviscosity; Psychiatric: Schizophrenia, depression; Other: Seizure, hypothermia, heat stroke, ICU psychosis, dementia -"sundowning."  She has no obvious evidence of head trauma however her daughter states she was having some confusion for about 4 days prior to being hospitalized for hip fracture on 11/11/2020.  I have ordered and reviewed labs which are currently pending and include CBG which shows a blood glucose of 113.  Ammonia level, CBC, urinalysis, CMP, lactic acid and a Covid and flu panel. I ordered and reviewed imaging which includes a 1 view chest x-ray. I reviewed the previous xrays and do not feel this represents an aortic aneurysm. CT head is pending. I have given sign out to PA Chalmette who will assume care of the patient.    Final Clinical Impression(s) / ED Diagnoses Final diagnoses:  None    Rx / DC Orders ED Discharge Orders    None       Arthor Captain, PA-C 11/20/20 1607    Jacalyn Lefevre, MD 11/21/20  [phone]

## 2020-11-20 NOTE — ED Notes (Signed)
Lab unable to locate the ammonia and lactic acid that was drawn at 1320 and sent shortly after. Provider made aware. RN to redraw.

## 2020-11-20 NOTE — ED Notes (Addendum)
Ammonia and lactic acid redrawn, placed on ice and sent over to lab. Called lab to confirm they received samples. Lab confirmed sampled have been received.

## 2020-11-21 DIAGNOSIS — R404 Transient alteration of awareness: Secondary | ICD-10-CM | POA: Diagnosis not present

## 2020-11-21 LAB — COMPREHENSIVE METABOLIC PANEL
ALT: 16 U/L (ref 0–44)
AST: 21 U/L (ref 15–41)
Albumin: 2.7 g/dL — ABNORMAL LOW (ref 3.5–5.0)
Alkaline Phosphatase: 65 U/L (ref 38–126)
Anion gap: 11 (ref 5–15)
BUN: 18 mg/dL (ref 8–23)
CO2: 29 mmol/L (ref 22–32)
Calcium: 8.7 mg/dL — ABNORMAL LOW (ref 8.9–10.3)
Chloride: 97 mmol/L — ABNORMAL LOW (ref 98–111)
Creatinine, Ser: 0.7 mg/dL (ref 0.44–1.00)
GFR, Estimated: >60 mL/min (ref >60)
Glucose, Bld: 148 mg/dL — ABNORMAL HIGH (ref 70–99)
Potassium: 3.3 mmol/L — ABNORMAL LOW (ref 3.5–5.1)
Sodium: 137 mmol/L (ref 135–145)
Total Bilirubin: 0.6 mg/dL (ref 0.3–1.2)
Total Protein: 5.9 g/dL — ABNORMAL LOW (ref 6.5–8.1)

## 2020-11-21 LAB — CBC
HCT: 34.1 % — ABNORMAL LOW (ref 36.0–46.0)
Hemoglobin: 10.6 g/dL — ABNORMAL LOW (ref 12.0–15.0)
MCH: 28.6 pg (ref 26.0–34.0)
MCHC: 31.1 g/dL (ref 30.0–36.0)
MCV: 91.9 fL (ref 80.0–100.0)
Platelets: 385 10*3/uL (ref 150–400)
RBC: 3.71 MIL/uL — ABNORMAL LOW (ref 3.87–5.11)
RDW: 15.8 % — ABNORMAL HIGH (ref 11.5–15.5)
WBC: 8.7 10*3/uL (ref 4.0–10.5)
nRBC: 0 % (ref 0.0–0.2)

## 2020-11-21 LAB — C-REACTIVE PROTEIN: CRP: 9.2 mg/dL — ABNORMAL HIGH (ref <1.0)

## 2020-11-21 LAB — VITAMIN B12: Vitamin B-12: 329 pg/mL (ref 180–914)

## 2020-11-21 LAB — T4, FREE: Free T4: 1.07 ng/dL (ref 0.61–1.12)

## 2020-11-21 LAB — CBG MONITORING, ED: Glucose-Capillary: 125 mg/dL — ABNORMAL HIGH (ref 70–99)

## 2020-11-21 LAB — SEDIMENTATION RATE: Sed Rate: 43 mm/hr — ABNORMAL HIGH (ref 0–22)

## 2020-11-21 LAB — TSH: TSH: 2.077 u[IU]/mL (ref 0.350–4.500)

## 2020-11-21 LAB — AMMONIA: Ammonia: 12 umol/L (ref 9–35)

## 2020-11-21 MED ORDER — FENTANYL 100 MCG/HR TD PT72: 1.0000 | MEDICATED_PATCH | TRANSDERMAL | Status: DC

## 2020-11-21 MED ORDER — LIFITEGRAST 5 % OP SOLN: 1.0000 [drp] | Freq: Two times a day (BID) | OPHTHALMIC | Status: DC

## 2020-11-21 MED ORDER — MIRTAZAPINE 15 MG PO TABS
15.0000 mg | ORAL_TABLET | Freq: Every day | ORAL | Status: DC
  Administered 2020-11-21 – 2020-11-24 (×4): 15 mg via ORAL
  Filled 2020-11-21 (×5): qty 1

## 2020-11-21 MED ORDER — DEXTROSE IN LACTATED RINGERS 5 % IV SOLN: INTRAVENOUS | Status: DC

## 2020-11-21 MED ORDER — SODIUM CHLORIDE 0.9 % IV SOLN
1.0000 g | INTRAVENOUS | Status: DC
  Administered 2020-11-21 – 2020-11-22 (×2): 1 g via INTRAVENOUS
  Filled 2020-11-21 (×2): qty 10

## 2020-11-21 MED ORDER — POLYETHYLENE GLYCOL 3350 17 G PO PACK: 17.0000 g | PACK | Freq: Every day | ORAL | Status: DC | PRN

## 2020-11-21 MED ORDER — ONDANSETRON HCL 4 MG PO TABS: 4.0000 mg | ORAL_TABLET | Freq: Four times a day (QID) | ORAL | Status: DC | PRN

## 2020-11-21 MED ORDER — CHLORHEXIDINE GLUCONATE CLOTH 2 % EX PADS
6.0000 | MEDICATED_PAD | Freq: Every day | CUTANEOUS | Status: DC
  Administered 2020-11-22 – 2020-11-25 (×3): 6 via TOPICAL

## 2020-11-21 MED ORDER — VITAMIN D (ERGOCALCIFEROL) 1.25 MG (50000 UNIT) PO CAPS
50000.0000 [IU] | ORAL_CAPSULE | ORAL | Status: DC
  Administered 2020-11-21: 50000 [IU] via ORAL
  Filled 2020-11-21 (×2): qty 1

## 2020-11-21 MED ORDER — SIMVASTATIN 20 MG PO TABS: 20.0000 mg | ORAL_TABLET | Freq: Every evening | ORAL | Status: DC

## 2020-11-21 MED ORDER — ACETAMINOPHEN 500 MG PO TABS
500.0000 mg | ORAL_TABLET | Freq: Four times a day (QID) | ORAL | Status: DC | PRN
  Administered 2020-11-22 – 2020-11-25 (×5): 500 mg via ORAL
  Filled 2020-11-21 (×5): qty 1

## 2020-11-21 MED ORDER — FENTANYL 100 MCG/HR TD PT72
1.0000 | MEDICATED_PATCH | TRANSDERMAL | Status: DC
  Administered 2020-11-23 – 2020-11-25 (×2): 1 via TRANSDERMAL
  Filled 2020-11-21 (×2): qty 1

## 2020-11-21 MED ORDER — FENTANYL 100 MCG/HR TD PT72
1.0000 | MEDICATED_PATCH | TRANSDERMAL | Status: DC
  Administered 2020-11-21: 1 via TRANSDERMAL
  Filled 2020-11-21: qty 1

## 2020-11-21 MED ORDER — DILTIAZEM HCL ER COATED BEADS 120 MG PO CP24
240.0000 mg | ORAL_CAPSULE | Freq: Every evening | ORAL | Status: DC
  Administered 2020-11-21 – 2020-11-24 (×4): 240 mg via ORAL
  Filled 2020-11-21 (×2): qty 2
  Filled 2020-11-21: qty 1
  Filled 2020-11-21 (×2): qty 2

## 2020-11-21 MED ORDER — ONDANSETRON HCL 4 MG/2ML IJ SOLN: 4.0000 mg | Freq: Four times a day (QID) | INTRAMUSCULAR | Status: DC | PRN

## 2020-11-21 MED ORDER — DOCUSATE SODIUM 100 MG PO CAPS
100.0000 mg | ORAL_CAPSULE | Freq: Every day | ORAL | Status: DC
  Administered 2020-11-21 – 2020-11-25 (×5): 100 mg via ORAL
  Filled 2020-11-21 (×5): qty 1

## 2020-11-21 MED ORDER — ENOXAPARIN SODIUM 30 MG/0.3ML ~~LOC~~ SOLN
30.0000 mg | SUBCUTANEOUS | Status: DC
  Administered 2020-11-21 – 2020-11-25 (×5): 30 mg via SUBCUTANEOUS
  Filled 2020-11-21 (×5): qty 0.3

## 2020-11-21 MED ORDER — TRIAMTERENE-HCTZ 37.5-25 MG PO TABS: 1.0000 | ORAL_TABLET | Freq: Every day | ORAL | Status: DC

## 2020-11-21 NOTE — ED Notes (Signed)
Old fentanyl patch removed from right chest. New patch applied to left chest.

## 2020-11-21 NOTE — ED Notes (Signed)
Tele  Breakfast Ordered 

## 2020-11-21 NOTE — ED Notes (Signed)
Family updated as to patient's status. Informed daughter of room assignment. Patient eating lunch at this time.

## 2020-11-21 NOTE — ED Notes (Signed)
Pt sitting up eating lunch

## 2020-11-21 NOTE — ED Notes (Signed)
No pain verbalized or observable at rest. Pain observable with movement/repositioning.

## 2020-11-21 NOTE — Progress Notes (Signed)
[facility] Progress Note  Patient: Claudia [name]    ERX:[id number]  DOA: 11/20/2020     Date of Service: the patient was seen and examined on 11/21/2020  Brief hospital course: Past medical history of HTN, chronic back pain on chronic fentanyl patch.  Presents with complaints of a fall at home on 2/3.  Discharge to SNF on 2/11.  Brought from SNF again on the same night due to concern with altered mental status.  Currently plan is monitor for the patient is receiving her home medication.  Assessment and Plan: 1.  Acute toxic encephalopathy Possible UTI Possible opioid induced confusion Patient presents with complaints of confusion.  Her pupils were pinpoint on arrival. Reportedly was unresponsive at SNF. Found to be having some pyuria and therefore admitted for concern for UTI. At present I do not feel that the patient is suffering from significant UTI but we will continue the antibiotics. There is also concern for pain medication-fentanyl patch-induced encephalopathy. Patient has been treated with fentanyl patch here in the hospital and the dose has not been modified. I do not think that the fentanyl patch is responsible for patient's encephalopathy as well. Patient is on 48-hour duration for replacement for the fentanyl patch per PDMP as well as PCPs notes therefore we will change the dose on PTA medication list as well as in the hospital. Monitor while his dose has been adjusted. CT head unremarkable. No focal deficit the time of my evaluation at bedside. Patient actually reports ongoing pain. At present we will continue to monitor without any intervention as she appears to be coming close to her baseline.  Closed comminuted right intertrochanteric fracture of proximal femurstatus post most likely mechanical fall underwent intramedullary nailing of the right hip on 11/12/2020. Wound care as per orthopedics recommendations.  PT OT recommended SNF.  Acute postop blood loss  anemia -Received 2 PRBC transfusion during last admission. H&H remained stable. Lovenox held because of anemia.  Acute urinary retention Possible CAUTI Required in and out catheterizationsand subsequent Foley catheter placement on 2/6. Voiding trial in future. Currently on IV antibiotics again do not think that the patient is suffering from UTI.  Essential hypertension: Controlled. Continue diltiazem.  Chronic back pain:  Per PDMP patient has been on fentanyl patch since June 2021. The dose has been 100 MCG per hour every 48 hours since June 2021. Prior to this patient was on morphine sulfate. At present I am continuing the same dose here in the hospital. If this dose is too much for the patient, we definitely have to reduce the dose, but also have to question how she was able to tolerate higher dose outpatient.  Capacity evaluation during last admission. -PT OT recommendedSNF.Patient refused SNF placement. There was concerns regarding patient's cognition and capacity. Daughter interested in patient being placed in SNF. -Psychiatry evaluation appreciated: Psychiatry thinks that patient does not have capacity to make medical decisions. Unsure whether her current presentation is withdrawn affect.  Diet: D3 diet. DVT Prophylaxis:   enoxaparin (LOVENOX) injection 30 mg Start: 11/21/20 1000    Advance goals of care discussion: DNR  Family Communication: no family was present at bedside, at the time of interview.   Subjective: No nausea no vomiting but no fever no chills.  Reports pain despite having her fentanyl patch on.  Physical Exam:  General: Appear in mild distress, no Rash; Oral Mucosa Clear, moist. no Abnormal Neck Mass Or lumps, Conjunctiva normal  Cardiovascular: S1 and S2 Present, no Murmur,  Respiratory: good respiratory effort, Bilateral Air entry present and CTA, no Crackles, no wheezes Abdomen: Bowel Sound present, Soft and no tenderness Extremities:  no Pedal edema Neurology: alert and oriented to person affect appropriate. no new focal deficit Gait not checked due to patient safety concerns With the help of the NT patient was checked for multiple patch on her body.  no fentanyl patch other than the one on the chest seen.  Vitals:   11/21/20 1200 11/21/20 1230 11/21/20 1300 11/21/20 1429  BP: 137/60 121/65 (!) 121/98 (!) 128/54  Pulse: 82 85 65 89  Resp: 16 17 18 17   Temp:    98.8 F (37.1 C)  TempSrc:    Oral  SpO2: 98% 99% 93% 99%    Intake/Output Summary (Last 24 hours) at 11/21/2020 1842 Last data filed at 11/21/2020 1700 Gross per 24 hour  Intake 410.92 ml  Output 900 ml  Net -489.08 ml   There were no vitals filed for this visit.  Data Reviewed: I have personally reviewed and interpreted daily labs, tele strips, imaging. I reviewed all nursing notes, pharmacy notes, vitals, pertinent old records I have discussed plan of care as described above with RN and patient/family.  CBC: Recent Labs  Lab 11/15/20 0243 11/15/20 1746 11/16/20 0908 11/16/20 1828 11/18/20 0046 11/20/20 1320 11/21/20 0915  WBC 6.9  --  5.5  --  6.2 9.7 8.7  NEUTROABS  --   --   --   --   --  7.3  --   HGB 7.2*   < > 7.3* 10.5* 9.3* 10.5* 10.6*  HCT 22.2*   < > 23.2* 32.5* 27.3* 34.1* 34.1*  MCV 85.7  --  87.9  --  86.1 92.4 91.9  PLT 162  --  202  --  245 364 385   < > = values in this interval not displayed.   Basic Metabolic Panel: Recent Labs  Lab 11/15/20 1107 11/20/20 1320 11/21/20 0915  NA 138 137 137  K 3.5 3.6 3.3*  CL 98 97* 97*  CO2 31 30 29   GLUCOSE 109* 109* 148*  BUN 12 18 18   CREATININE 0.79 0.74 0.70  CALCIUM 8.0* 8.6* 8.7*    Studies: No results found.  Scheduled Meds: . Chlorhexidine Gluconate Cloth  6 each Topical Daily  . diltiazem  240 mg Oral QPM  . docusate sodium  100 mg Oral Daily  . enoxaparin (LOVENOX) injection  30 mg Subcutaneous Q24H  . fentaNYL  1 patch Transdermal Q48H  . Lifitegrast  1  drop Both Eyes BID  . mirtazapine  15 mg Oral QHS  . Vitamin D (Ergocalciferol)  50,000 Units Oral Q Sun   Continuous Infusions: . cefTRIAXone (ROCEPHIN)  IV Stopped (11/21/20 0900)   PRN Meds: acetaminophen, ondansetron **OR** ondansetron (ZOFRAN) IV, polyethylene glycol  Time spent: 35 minutes  Author: , MD [facility] 11/21/2020 6:42 PM  To reach On-call, see care teams to locate the attending and reach out via [email or web address]. Between 7PM-7AM, please contact night-coverage If you still have difficulty reaching the attending provider, please page the [facility] (Director on Call) for [facility] on amion for assistance.

## 2020-11-22 ENCOUNTER — Ambulatory Visit: Payer: Medicare Other | Admitting: Internal Medicine

## 2020-11-22 DIAGNOSIS — R404 Transient alteration of awareness: Secondary | ICD-10-CM | POA: Diagnosis not present

## 2020-11-22 LAB — CBC
HCT: 30.3 % — ABNORMAL LOW (ref 36.0–46.0)
Hemoglobin: 9.6 g/dL — ABNORMAL LOW (ref 12.0–15.0)
MCH: 28.9 pg (ref 26.0–34.0)
MCHC: 31.7 g/dL (ref 30.0–36.0)
MCV: 91.3 fL (ref 80.0–100.0)
Platelets: 379 10*3/uL (ref 150–400)
RBC: 3.32 MIL/uL — ABNORMAL LOW (ref 3.87–5.11)
RDW: 15.7 % — ABNORMAL HIGH (ref 11.5–15.5)
WBC: 6.2 10*3/uL (ref 4.0–10.5)
nRBC: 0 % (ref 0.0–0.2)

## 2020-11-22 LAB — BASIC METABOLIC PANEL
Anion gap: 11 (ref 5–15)
BUN: 12 mg/dL (ref 8–23)
CO2: 28 mmol/L (ref 22–32)
Calcium: 8.5 mg/dL — ABNORMAL LOW (ref 8.9–10.3)
Chloride: 100 mmol/L (ref 98–111)
Creatinine, Ser: 0.67 mg/dL (ref 0.44–1.00)
GFR, Estimated: >60 mL/min (ref >60)
Glucose, Bld: 98 mg/dL (ref 70–99)
Potassium: 3.3 mmol/L — ABNORMAL LOW (ref 3.5–5.1)
Sodium: 139 mmol/L (ref 135–145)

## 2020-11-22 LAB — MAGNESIUM: Magnesium: 2.1 mg/dL (ref 1.7–2.4)

## 2020-11-22 MED ORDER — AMOXICILLIN 500 MG PO CAPS
500.0000 mg | ORAL_CAPSULE | Freq: Three times a day (TID) | ORAL | Status: DC
  Administered 2020-11-22 – 2020-11-25 (×10): 500 mg via ORAL
  Filled 2020-11-22 (×11): qty 1

## 2020-11-22 MED ORDER — VITAMIN B-12 1000 MCG PO TABS
1000.0000 ug | ORAL_TABLET | Freq: Every day | ORAL | Status: DC
  Administered 2020-11-22 – 2020-11-25 (×4): 1000 ug via ORAL
  Filled 2020-11-22 (×4): qty 1

## 2020-11-22 MED ORDER — FLUCONAZOLE 100 MG PO TABS
100.0000 mg | ORAL_TABLET | Freq: Every day | ORAL | Status: DC
  Administered 2020-11-22 – 2020-11-25 (×4): 100 mg via ORAL
  Filled 2020-11-22 (×4): qty 1

## 2020-11-22 NOTE — Progress Notes (Signed)
Pharmacy Antibiotic Note  Claudia [name] is a 87 y.o. female admitted on 11/20/2020 with altered mental status.  Pharmacy has been consulted for  Fluconazole dosing for UTI, yeast in Urine culture. Pprior recent hospitalization  required in and out catheterizationsand subsequent foley catheter placementon 2/6   2/12 Urine Cx:  >100k col/ml Enterococcus faecalis and Yeast 2/12 covid: neg, flu : neg  Plan: Fluconazole 100 mg po once daily.  Pharmacy will sign off.     Temp (24hrs), Avg:99.2 F (37.3 C), Min:98.7 F (37.1 C), Max:99.9 F (37.7 C)  Recent Labs  Lab 11/15/20 1107 11/16/20 0908 11/18/20 0046 11/20/20 1320 11/20/20 1734 11/21/20 0915 11/22/20 0810  WBC  --  5.5 6.2 9.7  --  8.7 6.2  CREATININE 0.79  --   --  0.74  --  0.70 0.67  LATICACIDVEN  --   --   --   --  0.9  --   --     Estimated Creatinine Clearance: 33.2 mL/min (by C-G formula based on SCr of 0.67 mg/dL).    Allergies  Allergen Reactions  . Amitiza [Lubiprostone] Nausea And Vomiting  . Benadryl [Diphenhydramine] Other (See Comments)    Makes me hyper  . Ultram [Tramadol] Other (See Comments)    Made sick to stomach  . Aspirin Other (See Comments)    Upsets stomach, and nervousness. "I feel like I'm going to scream."  . Hm Lidocaine Patch [Lidocaine] Rash  . Ibuprofen Other (See Comments)    Jittery, nervousness, similar to aspirin  . Sulfa Antibiotics Nausea And Vomiting and Rash    Antimicrobials this admission: Ceftriaxone 2/12>>2/14 Amoxicillin 2/14>> Fluconazole 2/14>>  Dose adjustments this admission: n/a  Microbiology results: 2/12 Urine Cx:  >100k col/ml Enterococcus faecalis and Yeast 2/12 covid: neg, flu : neg   Thank you for allowing pharmacy to be a part of this patient's care.  Noah Delaine, RPh Clinical Pharmacist [phone] Please check AMION for all [facility] Pharmacy phone numbers After 10:00 PM, call Main Pharmacy [phone] 11/22/2020 11:01 AM

## 2020-11-22 NOTE — Evaluation (Signed)
Physical Therapy Evaluation Patient Details Name: Claudia [name] MRN: [id number] DOB: 23-Nov-1930 Today's Date: 11/22/2020   History of Present Illness  87yo female with recent hx of hip fracture for which she received a R IM nail on 11/12/20, then discharged to SNF. She now returns with new AMS and was admitted for workup. PMH back pain, CHF, chronic pain, HLD, HTN, OA, R shoulder surgery  Clinical Impression   Patient received in bed, very confused- oriented to her name but unable to tell me her own birthday even with max leading cues, unsure of where she is and does not remember events that brought her to the hospital. Needed Mod-MaxA to mobilize to partial standing in stedy with consistent, simple cues and needed increased time for processing as well as hand over hand assist for correct hand placement. Found to have had BM and needed totalA for pericare. Left in bed with all needs met, bed alarm active. Definitely in need of return to SNF for ongoing rehab.     Follow Up Recommendations SNF;Supervision/Assistance - 24 hour    Equipment Recommendations  Rolling walker with 5" wheels;3in1 (PT);Wheelchair (measurements PT);Wheelchair cushion (measurements PT)    Recommendations for Other Services       Precautions / Restrictions Precautions Precautions: Fall Restrictions Weight Bearing Restrictions: No RLE Weight Bearing: Weight bearing as tolerated      Mobility  Bed Mobility   Bed Mobility: Rolling;Supine to Sit;Sit to Supine Rolling: Mod assist   Supine to sit: Max assist Sit to supine: Mod assist   General bed mobility comments: ModA to roll side to side for pericare and placement of clean chuck pads, then MaxA to get to EOB and square up at bedside. Able to manage trunk for return to supine while PT helped with her legs    Transfers Overall transfer level: Needs assistance Equipment used: Ambulation equipment used (RW not in room) Transfers: Sit to/from Stand Sit to  Stand: Max assist         General transfer comment: MaxA to even stand up to partial upright, easily fatigued and difficulty weight bearing on R LE  Ambulation/Gait             General Gait Details: unable  Stairs            Wheelchair Mobility    Modified Rankin (Stroke Patients Only)       Balance Overall balance assessment: Needs assistance Sitting-balance support: No upper extremity supported;Feet supported Sitting balance-Leahy Scale: Fair Sitting balance - Comments: min guard for safety   Standing balance support: Bilateral upper extremity supported;During functional activity Standing balance-Leahy Scale: Poor Standing balance comment: reliant on external assist                             Pertinent Vitals/Pain Pain Assessment: Faces Faces Pain Scale: Hurts little more Pain Location: R hip Pain Descriptors / Indicators: Aching;Sore Pain Intervention(s): Limited activity within patient's tolerance;Monitored during session;Repositioned    Home Living Family/patient expects to be discharged to:: Private residence Living Arrangements: Children (disabled daughter) Available Help at Discharge: Available PRN/intermittently;Family Type of Home: House Home Access: Level entry     Home Layout: One level Home Equipment: Bedside commode;Walker - 4 wheels;Cane - single point;Shower seat Additional Comments: ambulated with rollator per daughter    Prior Function Level of Independence: Needs assistance   Gait / Transfers Assistance Needed: ambulates with rollator  ADL's / Homemaking Assistance Needed:  Her adopted daughter's aide would assist the patient with bathing and dressing while she was there, had mobile meals  Comments: pt with varying reports of who checks on her, daughter vs granddaughter     Hand Dominance   Dominant Hand: Right    Extremity/Trunk Assessment   Upper Extremity Assessment Upper Extremity Assessment: Defer to OT  evaluation    Lower Extremity Assessment Lower Extremity Assessment: Generalized weakness    Cervical / Trunk Assessment Cervical / Trunk Assessment: Kyphotic  Communication   Communication: HOH  Cognition Arousal/Alertness: Awake/alert Behavior During Therapy: Flat affect Overall Cognitive Status: No family/caregiver present to determine baseline cognitive functioning Area of Impairment: Orientation;Attention;Memory;Following commands;Safety/judgement;Awareness;Problem solving                 Orientation Level: Disoriented to;Place;Time;Situation (knows her name but not her birthday) Current Attention Level: Sustained Memory: Decreased short-term memory;Decreased recall of precautions Following Commands: Follows one step commands inconsistently;Follows one step commands with increased time Safety/Judgement: Decreased awareness of safety;Decreased awareness of deficits Awareness: Intellectual Problem Solving: Slow processing;Difficulty sequencing;Requires verbal cues;Requires tactile cues General Comments: very flat affect and perseverates on telling me "I'm supposed to be upstairs right now, they are coming to take me" but would not tell me what is upstairs. Barely oriented to herself this morning. Unable to figure out her birthday even with max leading cues and cannot tell me why she is here in the hospital or what hospital she is in.      General Comments      Exercises     Assessment/Plan    PT Assessment Patient needs continued PT services  PT Problem List Decreased strength;Decreased range of motion;Decreased activity tolerance;Decreased balance;Decreased mobility;Decreased cognition;Decreased safety awareness;Pain       PT Treatment Interventions DME instruction;Gait training;Functional mobility training;Therapeutic activities;Therapeutic exercise;Balance training;Patient/family education    PT Goals (Current goals can be found in the Care Plan section)  Acute  Rehab PT Goals PT Goal Formulation: Patient unable to participate in goal setting Time For Goal Achievement: 12/06/20 Potential to Achieve Goals: Fair    Frequency Min 2X/week   Barriers to discharge        Co-evaluation               AM-PAC PT "6 Clicks" Mobility  Outcome Measure Help needed turning from your back to your side while in a flat bed without using bedrails?: A Lot Help needed moving from lying on your back to sitting on the side of a flat bed without using bedrails?: A Lot Help needed moving to and from a bed to a chair (including a wheelchair)?: A Lot Help needed standing up from a chair using your arms (e.g., wheelchair or bedside chair)?: A Lot Help needed to walk in hospital room?: Total Help needed climbing 3-5 steps with a railing? : Total 6 Click Score: 10    End of Session Equipment Utilized During Treatment: Gait belt Activity Tolerance: Patient tolerated treatment well Patient left: in bed;with call bell/phone within reach;with bed alarm set (recliner not available in room) Nurse Communication: Mobility status PT Visit Diagnosis: Unsteadiness on feet (R26.81);Muscle weakness (generalized) (M62.81);History of falling (Z91.81);Difficulty in walking, not elsewhere classified (R26.2)    Time: 7619-5093 PT Time Calculation (min) (ACUTE ONLY): 18 min   Charges:   PT Evaluation $PT Eval Moderate Complexity: 1 Mod          Darik Massing U PT, DPT, PN1   Supplemental Physical Therapist [place]    Pager  [phone] Acute Rehab Office [phone]

## 2020-11-22 NOTE — Progress Notes (Addendum)
[facility] Progress Note  Patient: Claudia [name]    JYN:[id number]  DOA: 11/20/2020     Date of Service: the patient was seen and examined on 11/22/2020  Brief hospital course: Past medical history of HTN, chronic back pain on chronic fentanyl patch.  Presents with complaints of a fall at home on 2/3.  Discharge to SNF on 2/11.  Brought from SNF again on the same night due to concern with altered mental status.  Currently plan is monitor for the patient is receiving her home medication.  Assessment and Plan: 1.  Acute toxic encephalopathy Enterococcus and fungal UTI Possible opioid induced confusion Patient presents with complaints of confusion.  Her pupils were pinpoint on arrival. Reportedly was unresponsive at SNF. Found to be having some pyuria and therefore admitted for concern for UTI. Changing antibiotics to amoxicillin and also adding Diflucan. There is also concern for pain medication-fentanyl patch-induced encephalopathy. Patient has been treated with fentanyl patch here in the hospital and the dose has not been modified. I do not think that the fentanyl patch is responsible for patient's encephalopathy as well. Patient is on 48-hour duration for replacement for the fentanyl patch per PDMP as well as PCPs notes therefore we will change the dose on PTA medication list as well as in the hospital. Monitor while his dose has been adjusted. CT head unremarkable. No focal deficit the time of my evaluation at bedside. Patient actually reports ongoing pain. Check EEG based on discussion with daughter.  Although I do not think the patient is suffering from seizures. At present we will continue to monitor without any intervention as she appears to be coming close to her baseline.  Closed comminuted right intertrochanteric fracture of proximal femurstatus post most likely mechanical fall underwent intramedullary nailing of the right hip on 11/12/2020. Wound care as per  orthopedics recommendations.  PT OT recommended SNF.  Acute postop blood loss anemia -Received 2 PRBC transfusion during last admission. H&H remained stable. Lovenox held because of anemia.  Acute urinary retention Possible CAUTI Required in and out catheterizationsand subsequent Foley catheter placement on 2/6. Voiding trial in future. Currently on IV antibiotics again do not think that the patient is suffering from UTI.  Essential hypertension: Controlled. Continue diltiazem.  Chronic back pain:  Per PDMP patient has been on fentanyl patch since June 2021. The dose has been 100 MCG per hour every 48 hours since June 2021. Prior to this patient was on morphine sulfate. At present I am continuing the same dose here in the hospital. If this dose is too much for the patient, we definitely have to reduce the dose, but also have to question how she was able to tolerate higher dose outpatient.  Capacity evaluation during last admission. -PT OT recommendedSNF.Patient refused SNF placement. There was concerns regarding patient's cognition and capacity. Daughter interested in patient being placed in SNF. -Psychiatry evaluation appreciated: Psychiatry thinks that patient does not have capacity to make medical decisions. Unsure whether her current presentation is withdrawn affect.  Mood disorder. Patient is on tramadol chronically. Suspect that her presentation could also be secondary to depression or behavioral issues. Currently no agitation. I will be holding off on further modification of her regimen for now due to concern with polypharmacy.  Hypokalemia.  Currently being replaced.  Diet: D3 diet. DVT Prophylaxis:   enoxaparin (LOVENOX) injection 30 mg Start: 11/21/20 1000    Advance goals of care discussion: DNR  Family Communication: no family was present at bedside,  at the time of interview.  Discussed with daughter on the phone.  Subjective: No nausea no  vomiting.  No fever no chills.  No chest pain.  Abdominal pain.  Continues to have uncontrolled pain at her surgical site.  Daughter reports that the patient was not following any commands and not communicating when she saw her right after I saw the patient.  Physical Exam:  General: Appear in mild distress, no Rash; Oral Mucosa Clear, moist. no Abnormal Neck Mass Or lumps, Conjunctiva normal  Cardiovascular: S1 and S2 Present, no Murmur, Respiratory: good respiratory effort, Bilateral Air entry present and CTA, no Crackles, no wheezes Abdomen: Bowel Sound present, Soft and no tenderness Extremities: no Pedal edema Neurology: alert and oriented to place, and person affect appropriate. no new focal deficit, following commands. Gait not checked due to patient safety concerns  Vitals:   11/21/20 2135 11/22/20 0851 11/22/20 1730 11/22/20 2032  BP: (!) 105/56 (!) 101/40 106/66 (!) 127/56  Pulse: 80 79 85 88  Resp: 17 18 16 18   Temp:  99.3 F (37.4 C) 99.6 F (37.6 C) 99.7 F (37.6 C)  TempSrc: Oral  Oral Oral  SpO2: 95% 97% 99% 97%    Intake/Output Summary (Last 24 hours) at 11/22/2020 2109 Last data filed at 11/22/2020 1825 Gross per 24 hour  Intake 755 ml  Output 650 ml  Net 105 ml   There were no vitals filed for this visit.  Data Reviewed: I have personally reviewed and interpreted daily labs, tele strips, imaging. I reviewed all nursing notes, pharmacy notes, vitals, pertinent old records I have discussed plan of care as described above with RN and patient/family.  CBC: Recent Labs  Lab 11/16/20 0908 11/16/20 1828 11/18/20 0046 11/20/20 1320 11/21/20 0915 11/22/20 0810  WBC 5.5  --  6.2 9.7 8.7 6.2  NEUTROABS  --   --   --  7.3  --   --   HGB 7.3* 10.5* 9.3* 10.5* 10.6* 9.6*  HCT 23.2* 32.5* 27.3* 34.1* 34.1* 30.3*  MCV 87.9  --  86.1 92.4 91.9 91.3  PLT 202  --  245 364 385 379   Basic Metabolic Panel: Recent Labs  Lab 11/20/20 1320 11/21/20 0915  11/22/20 0810  NA 137 137 139  K 3.6 3.3* 3.3*  CL 97* 97* 100  CO2 30 29 28   GLUCOSE 109* 148* 98  BUN 18 18 12   CREATININE 0.74 0.70 0.67  CALCIUM 8.6* 8.7* 8.5*  MG  --   --  2.1    Studies: No results found.  Scheduled Meds: . amoxicillin  500 mg Oral Q8H  . Chlorhexidine Gluconate Cloth  6 each Topical Daily  . diltiazem  240 mg Oral QPM  . docusate sodium  100 mg Oral Daily  . enoxaparin (LOVENOX) injection  30 mg Subcutaneous Q24H  . fentaNYL  1 patch Transdermal Q48H  . fluconazole  100 mg Oral Daily  . mirtazapine  15 mg Oral QHS  . vitamin B-12  1,000 mcg Oral Daily  . Vitamin D (Ergocalciferol)  50,000 Units Oral Q Sun   Continuous Infusions:  PRN Meds: acetaminophen, ondansetron **OR** ondansetron (ZOFRAN) IV, polyethylene glycol  Time spent: 35 minutes  Author: 11/24/20, MD [facility] 11/22/2020 9:09 PM  To reach On-call, see care teams to locate the attending and reach out via www. . Between 7PM-7AM, please contact night-coverage If you still have difficulty reaching the attending provider, please page the [facility] (Director on Call) for  [facility] on amion for assistance.

## 2020-11-23 ENCOUNTER — Inpatient Hospital Stay (HOSPITAL_COMMUNITY)
Admit: 2020-11-23 | Discharge: 2020-11-23 | Disposition: A | Discharge status: Home / Self Care | Payer: Medicare Other | Attending: Internal Medicine | Admitting: Internal Medicine

## 2020-11-23 DIAGNOSIS — R404 Transient alteration of awareness: Secondary | ICD-10-CM | POA: Diagnosis not present

## 2020-11-23 DIAGNOSIS — R4182 Altered mental status, unspecified: Secondary | ICD-10-CM | POA: Diagnosis not present

## 2020-11-23 LAB — BASIC METABOLIC PANEL
Anion gap: 8 (ref 5–15)
BUN: 14 mg/dL (ref 8–23)
CO2: 27 mmol/L (ref 22–32)
Calcium: 8.4 mg/dL — ABNORMAL LOW (ref 8.9–10.3)
Chloride: 101 mmol/L (ref 98–111)
Creatinine, Ser: 0.71 mg/dL (ref 0.44–1.00)
GFR, Estimated: >60 mL/min (ref >60)
Glucose, Bld: 109 mg/dL — ABNORMAL HIGH (ref 70–99)
Potassium: 3.5 mmol/L (ref 3.5–5.1)
Sodium: 136 mmol/L (ref 135–145)

## 2020-11-23 LAB — CBC
HCT: 28.2 % — ABNORMAL LOW (ref 36.0–46.0)
Hemoglobin: 9.3 g/dL — ABNORMAL LOW (ref 12.0–15.0)
MCH: 29.3 pg (ref 26.0–34.0)
MCHC: 33 g/dL (ref 30.0–36.0)
MCV: 89 fL (ref 80.0–100.0)
Platelets: 392 10*3/uL (ref 150–400)
RBC: 3.17 MIL/uL — ABNORMAL LOW (ref 3.87–5.11)
RDW: 15.7 % — ABNORMAL HIGH (ref 11.5–15.5)
WBC: 5.5 10*3/uL (ref 4.0–10.5)
nRBC: 0 % (ref 0.0–0.2)

## 2020-11-23 LAB — URINE CULTURE: Culture: >=100000 — AB

## 2020-11-23 LAB — SARS CORONAVIRUS 2 (TAT 6-24 HRS): SARS Coronavirus 2: NEGATIVE

## 2020-11-23 LAB — MAGNESIUM: Magnesium: 2 mg/dL (ref 1.7–2.4)

## 2020-11-23 MED ORDER — CYANOCOBALAMIN 1000 MCG PO TABS: 1000.0000 ug | ORAL_TABLET | Freq: Every day | ORAL | 0 refills | Status: AC

## 2020-11-23 MED ORDER — ATORVASTATIN CALCIUM 10 MG PO TABS: 10.0000 mg | ORAL_TABLET | Freq: Every day | ORAL | 0 refills | Status: AC

## 2020-11-23 MED ORDER — FLUCONAZOLE 100 MG PO TABS: 100.0000 mg | ORAL_TABLET | Freq: Every day | ORAL | 0 refills | Status: AC

## 2020-11-23 MED ORDER — AMOXICILLIN 500 MG PO CAPS: 500.0000 mg | ORAL_CAPSULE | Freq: Three times a day (TID) | ORAL | 0 refills | Status: AC

## 2020-11-23 MED ORDER — FENTANYL 100 MCG/HR TD PT72: 1.0000 | MEDICATED_PATCH | TRANSDERMAL | 0 refills | Status: AC

## 2020-11-23 MED ORDER — VITAMIN D (ERGOCALCIFEROL) 1.25 MG (50000 UNIT) PO CAPS: 50000.0000 [IU] | ORAL_CAPSULE | ORAL | 0 refills | Status: AC

## 2020-11-23 NOTE — TOC Initial Note (Addendum)
Transition of Care [facility]) - Initial/Assessment Note    Patient Details  Name: Claudia [name] MRN: [id number] Date of Birth: 1931-01-19  [facility]) CM/SW Contact:    Claudia Goldmann, LCSW Phone Number: 11/23/2020, 2:42 PM  Clinical Narrative:  CSW contacted daughter, Claudia [name] ([phone]) to discuss her mother's readiness for discharge and her dispostion. Ms. Claudia [name] acknowledged that her mom discharged to [facility], but she exprrienced problems with this facility and felt that another facility may be better. While talking with her mom by phone about going to a facility, Ms. Baus mentioned [facility] and daughter commented that this facility is not far from her home.     5:34 pm: Received call from [employer] with insurance authorization information: Approved effective 2/15 for 3 days. Next review date is 2/17. Case manager is Technical brewer and the reference number is [id number].      Patient will discharge to [facility] on 2/16.         Expected Discharge Plan: Skilled Nursing Facility Barriers to Discharge: Other (comment) (COVID test results)   Patient Goals and CMS Choice Patient states their goals for this hospitalization and ongoing recovery are:: Patient and dughter agreeable to patient continuing rehab to get stronger CMS Medicare.gov Compare Post Acute Care list provided to:: Other (Comment Required) (Not provided as patient advised daugher of facility she wanted to go to) Choice offered to / list presented to : NA  Expected Discharge Plan and Services Expected Discharge Plan: Skilled Nursing Facility In-house Referral: Clinical Social Work     Living arrangements for the past 2 months: Skilled Nursing Facility (Patient discharged back to hospital from SNF)                                      Prior Living Arrangements/Services Living arrangements for the past 2 months: Skilled Nursing Facility (Patient discharged back to hospital  from SNF) Lives with:: Adult Children Patient language and need for interpreter reviewed:: No Do you feel safe going back to the place where you live?: Yes (After rehab per daughter)      Need for Family Participation in Patient Care: Yes (Comment) Care giver support system in place?: Yes (comment) Current home services: [employer] Criminal Activity/Legal Involvement Pertinent to Current Situation/Hospitalization: No - Comment as needed  Activities of Daily Living      Permission Sought/Granted Permission sought to share information with : Family Supports (Daughter contacted initially as patient oriented to persn and place only. CSW visited room with daughter on the phone and patient was fine with CSW talking to her daughter.) Permission granted to share information with : No (Patient did not verbally consent; was fine with CSW talking with her daughter when CSW visited room and she talked with her daughter on the phone.)              Emotional Assessment Appearance:: Appears stated age Attitude/Demeanor/Rapport: Engaged Affect (typically observed): Appropriate,Pleasant Orientation: : Oriented to Self,Oriented to Place Alcohol / Substance Use: Tobacco Use,Alcohol Use,Illicit Drugs (Per H&P patient has never smoked and does not drink or use illicit drugs) Psych Involvement: No (comment)  Admission diagnosis:  AMS (altered mental status) [R41.82] Patient Active Problem List   Diagnosis Date Noted  . AMS (altered mental status) 11/20/2020  . Closed comminuted intertrochanteric fracture of proximal femur, right, initial encounter (HCC) 11/11/2020  . Closed right  hip fracture (HCC) 11/11/2020  . Pain   . Palliative care by specialist   . Goals of care, counseling/discussion   . DNR (do not resuscitate)   . DNI (do not intubate)   . Encounter for hospice care discussion   . Fall at home, initial encounter 07/28/2020  . Fall 07/28/2020  . Weakness   . Closed fracture of  nasal bones   . Acute cystitis without hematuria   . Pain management contract signed 04/08/2020  . Protein-calorie malnutrition, severe (HCC) 03/11/2020  . Hyperlipidemia 12/20/2017  . Routine general medical examination at a health care facility 12/19/2016  . Insomnia 06/19/2015  . Narcotic dependence (HCC) 12/13/2013  . Chronic back pain 12/13/2013  . Hypertension    PCP:  [name], MD Pharmacy:   [facility], [place] - [address] [place] [address] Phone: [phone] Fax: [phone]     Social Determinants of Health (SDOH) Interventions    Readmission Risk Interventions No flowsheet data found.

## 2020-11-23 NOTE — Progress Notes (Signed)
EEG complete - results pending 

## 2020-11-23 NOTE — Discharge Summary (Addendum)
[facility] Discharge Summary   Patient: Claudia [name] Majeed JYN:[id number]  PCP: Myrlene Brokerrawford, Elizabeth A, MD  Date of admission: 11/20/2020   Date of discharge:  11/23/2020     Discharge Diagnoses:  Principal Problem:   AMS (altered mental status) Active Problems:   Hypertension   Hyperlipidemia   Weakness   Acute cystitis without hematuria   DNR (do not resuscitate)   DNI (do not intubate)   Admitted From: SNF (recently admitted for hip fracture and sent to SNF) Disposition:  SNF   Recommendations for Outpatient Follow-up:  1. PCP: please follow up with PCP and Orthopedics as recommended 2. Follow up LABS/TEST:  none   Follow-up Information    Myrlene Brokerrawford, Elizabeth A, MD. Schedule an appointment as soon as possible for a visit in 1 week(s).   Specialty: Internal Medicine Contact information: [address] [place] [address] [phone]        Myrene GalasHandy, Michael, MD. Schedule an appointment as soon as possible for a visit in 1 week(s).   Specialty: Orthopedic Surgery Contact information: [address] [place] [address] [phone]              Discharge Instructions    Diet - low sodium heart healthy   Complete by: As directed    Increase activity slowly   Complete by: As directed    Leave dressing on - Keep it clean, dry, and intact until clinic visit   Complete by: As directed       Diet recommendation: Regular diet  Activity: The patient is advised to gradually reintroduce usual activities, as tolerated  Discharge Condition: stable  Code Status: DNR   History of present illness: As per the H and P dictated on admission, "Claudia [name] [name] is a 87 y.o. female with medical history significant of coronary artery disease, deafness pressure CHF, recent intertrochanteric right hip fracture with IM pain performed by Dr. Carola FrostHandy, chronic pain syndrome with fentanyl patch in place, hypertension, GERD, hyperlipidemia, who was discharged to [facility] due  to skilled nursing facility only yesterday.  Patient returned today secondary to altered mental status.  Patient's daughter apparently visited her and found that she was staring into space.  She is not communicating adequately with her.  Her medications have not changed from hospital medications.  She had acute urinary retention recently while in the hospital and had several in and out catheterizations.  She has had previous UTIs.  Also has had acute blood loss anemia.  Patient was subsequently admitted to the hospital now with new altered mental status.  Her urinalysis shows possible early UTI but not extremely impressive.  Prior to discharge to skilled facility if patient voids her reluctance and given refusal to go.  Daughter resumed decision making due to determination that she lacks cognition and capacity.  Not sure if patient is depressed as a result.  She is currently not able to give me much history except complaining of pain.  She has a fentanyl patch in place but this is not new.."  Hospital Course:  Summary of her active problems in the hospital is as following. 1.  Acute toxic encephalopathy Enterococcus and fungal UTI Possible opioid induced confusion Patient presents with complaints of confusion.  Her pupils were pinpoint on arrival. Reportedly was unresponsive at SNF. Found to be having some pyuria and therefore admitted for concern for UTI. Treated with antibiotics to amoxicillin and also adding Diflucan. There is also concern for pain medication-fentanyl patch-induced encephalopathy. Patient  has been treated with same dose fentanyl patch here in the hospital prior to discharge and the dose has not been modified. I do not think that the fentanyl patch is responsible for patient's encephalopathy as well. Patient is on 48-hour duration for replacement for the fentanyl patch per PDMP chronically as well as PCPs notes therefore we will change the dose on PTA medication list as well as in  the hospital. CT head unremarkable. No focal deficit the time of my evaluation at bedside. Patient actually reports ongoing pain. EEG checked based on discussion with daughter. No evideince of seizures or seizure focus. appears to be coming close to her baseline.  Closed comminuted right intertrochanteric fracture of proximal femurstatus post most likely mechanical fall underwent intramedullary nailing of the right hip on 11/12/2020. Wound care as per orthopedics recommendations.  PT OT recommendedSNF. Outpatient follow up in 1 week  Acute postop blood loss anemia Received 2 PRBC transfusion during last admission. H&H remained stable. Lovenox held because of anemia.  Acute urinary retention Possible CAUTI Required in and out catheterizationsand subsequent Foley catheter placement on 2/6. Voiding trial in future. Currently on IV antibiotics again do not think that the patient is suffering from UTI.  Essential hypertension: Controlled. Continue diltiazem.  Chronic back pain:  Per PDMP patient has been on fentanyl patch since June 2021. The dose has been 100 MCG per hour every 48 hours since June 2021. Prior to this patient was on morphine sulfate. At present I am continuing the same dose here in the hospital. If this dose is too much for the patient, we definitely have to reduce the dose, but also have to question how she was able to tolerate higher dose outpatient.  Capacity evaluation during last admission. PT OT recommendedSNF. Patient refused SNF placement There was concerns regarding patient's cognition and capacity. Daughter interested in patient being placed in SNF. Psychiatry evaluation appreciated: Psychiatry thinks that patient does not have capacity to make medical decisions. Unsure whether her current presentation is withdrawn affect.  Mood disorder. Patient is on tramadol chronically. Suspect that her presentation could also be secondary to  depression or behavioral issues. Currently no agitation. I will be holding off on further modification of her regimen for now due to concern with polypharmacy.  Hypokalemia.  Currently being replaced.  Pain control  - [employer] Controlled Substance Reporting System database was reviewed. - 1 day supply was provided. Pt is on fentanyl patch 100 mcg q48 hours chronically - Patient was instructed, not to drive, operate heavy machinery, perform activities at heights, swimming or participation in water activities or provide baby sitting services while on Pain, Sleep and Anxiety Medications; until her outpatient Physician has advised to do so again.  - Also recommended to not to take more than prescribed Pain, Sleep and Anxiety Medications.  Patient was seen by physical therapy, who recommended SNF On the day of the discharge the patient's vitals were stable, and no other acute medical condition were reported by patient. The patient was felt safe to be discharge at SNF with Therapy.  Consultants: none Procedures: EEG  DISCHARGE MEDICATION: Allergies as of 11/23/2020      Reactions   Amitiza [lubiprostone] Nausea And Vomiting   Benadryl [diphenhydramine] Other (See Comments)   Makes me hyper   Ultram [tramadol] Other (See Comments)   Made sick to stomach   Aspirin Other (See Comments)   Upsets stomach, and nervousness. "I feel like I'm going to scream."   Hm Lidocaine  Patch [lidocaine] Rash   Ibuprofen Other (See Comments)   Jittery, nervousness, similar to aspirin   Sulfa Antibiotics Nausea And Vomiting, Rash      Medication List    STOP taking these medications   simvastatin 20 MG tablet Commonly known as: ZOCOR   triamterene-hydrochlorothiazide 37.5-25 MG tablet Commonly known as: MAXZIDE-25     TAKE these medications   acetaminophen 500 MG tablet Commonly known as: TYLENOL Take 500 mg by mouth in the morning and at bedtime.   amoxicillin 500 MG capsule Commonly  known as: AMOXIL Take 1 capsule (500 mg total) by mouth 3 (three) times daily for 3 days.   atorvastatin 10 MG tablet Commonly known as: Lipitor Take 1 tablet (10 mg total) by mouth daily.   cyanocobalamin 1000 MCG tablet Take 1 tablet (1,000 mcg total) by mouth daily. Start taking on: November 24, 2020   diltiazem 120 MG 24 hr capsule Commonly known as: CARDIZEM CD Take 2 capsules (240 mg total) by mouth every evening.   DSS 100 MG Caps Take 100 mg by mouth daily.   fentaNYL 100 MCG/HR Commonly known as: DURAGESIC Place 1 patch onto the skin every other day.   fluconazole 100 MG tablet Commonly known as: DIFLUCAN Take 1 tablet (100 mg total) by mouth daily for 4 days. Start taking on: November 24, 2020   mirtazapine 15 MG tablet Commonly known as: REMERON Take 1 tablet (15 mg total) by mouth at bedtime.   pantoprazole 40 MG tablet Commonly known as: PROTONIX TAKE 1 TABLET EACH DAY.   polyethylene glycol powder 17 GM/SCOOP powder Commonly known as: GLYCOLAX/MIRALAX Take 17 g by mouth daily as needed for mild constipation.   Vitamin D (Ergocalciferol) 1.25 MG (50000 UNIT) Caps capsule Commonly known as: DRISDOL Take 1 capsule (50,000 Units total) by mouth every /15/22 1331          Discharge Exam: There were no vitals filed for this visit. Vitals:   11/23/20 0533 11/23/20 1058  BP: (!) 101/48 (!) 113/57  Pulse: 71 81  Resp: 18 16  Temp: 98.5 F (36.9 C) 98.2 F (36.8 C)  SpO2: 97% 95%   General: Appear in mild distress, no Rash; Oral Mucosa Clear, moist. no Abnormal Neck Mass Or lumps, Conjunctiva normal   Cardiovascular: S1 and S2 Present, no Murmur, Respiratory: good respiratory effort, Bilateral Air entry present and CTA, no Crackles, no wheezes Abdomen: Bowel Sound present, Soft and no tenderness Extremities: no Pedal edema Neurology: alert and oriented to place, and person affect appropriate. no new focal deficit Gait not checked due to patient safety concerns  The results of significant diagnostics from this hospitalization (including imaging, microbiology, ancillary and laboratory) are listed below for reference.    Significant Diagnostic Studies: DG Chest 1 View  Result Date: 11/11/2020 CLINICAL DATA:  Post fall with right hip fracture. EXAM: CHEST  1 VIEW COMPARISON:  Radiograph 07/28/2020 FINDINGS: Patient is rotated. The heart is normal in size. There is aortic atherosclerosis  and tortuosity, tortuosity exaggerated by rotation. Left lung apex not entirely included in the field of view. No pneumothorax or focal airspace disease. No pleural fluid. Bones under mineralized. Scoliotic curvature of the upper lumbar spine. No acute osseous abnormalities are seen. IMPRESSION: 1. No acute abnormality. 2. Aortic atherosclerosis and tortuosity, tortuosity exaggerated by rotation. Aortic Atherosclerosis (ICD10-I70.0). Electronically Signed   By: Narda Rutherford M.D.   On: 11/11/2020 16:07   DG Knee 2 Views Right  Result Date: 11/11/2020 CLINICAL DATA:  Fall.  Hip fracture EXAM: RIGHT KNEE - 1-2 VIEW COMPARISON:  None FINDINGS: Normal alignment no fracture. Moderate degenerative change in the lateral joint space with mild joint space narrowing and spurring. No effusion. IMPRESSION: Negative for fracture. Electronically Signed   By: Marlan Palau M.D.   On: 11/11/2020 18:14   CT Head Wo Contrast  Result Date: 11/20/2020 CLINICAL DATA:  Altered mental status.  Found unresponsive. EXAM: CT HEAD WITHOUT CONTRAST TECHNIQUE: Contiguous axial images were obtained from the base of the skull through the  vertex without intravenous contrast. COMPARISON:  MR brain dated August 30, 2020. CT head dated August 10, 2020. FINDINGS: Brain: No evidence of acute infarction, hemorrhage, hydrocephalus, extra-axial collection or mass lesion/mass effect. Stable moderate atrophy and chronic microvascular ischemic changes. Vascular: Calcified atherosclerosis at the skullbase. No hyperdense vessel. Skull: Normal. Negative for fracture or focal lesion. Sinuses/Orbits: No acute finding. Other: None. IMPRESSION: 1. No acute intracranial abnormality. 2. Stable moderate atrophy and chronic microvascular ischemic changes. Electronically Signed   By: Obie Dredge M.D.   On: 11/20/2020 16:04   DG Chest Portable 1 View  Result Date: 11/20/2020 CLINICAL DATA:  Altered mental status EXAM: PORTABLE CHEST 1 VIEW COMPARISON:  11/11/2020 FINDINGS: The heart is normal in size. Unchanged prominence of the aorta. Both lungs are clear. The visualized skeletal structures are unremarkable. IMPRESSION: 1. No acute abnormality of the lungs. 2. Unchanged prominence of the aorta, suggesting aortic aneurysm. Recommend follow-up CT angiogram of the chest on a nonemergent basis in the absence of acutely referable signs and symptoms. Electronically Signed   By: Lauralyn Primes M.D.   On: 11/20/2020 14:09   EEG adult  Result Date: 11/23/2020 Charlsie Quest, MD     11/23/2020 11:02 AM Patient Name: DEROTHA FISHBAUGH MRN: [id number] Epilepsy Attending: Charlsie Quest Referring Physician/Provider: Dr. Lynden Oxford Date: 11/23/2020 Duration: 25.11 mins Patient history: 87 year old female with altered mental status. EEG to evaluate for seizures. Level of alertness: Awake AEDs during EEG study: None Technical aspects: This EEG study was done with scalp electrodes positioned according to the 10-20 International system of electrode placement. Electrical activity was acquired at a sampling rate of 500Hz  and reviewed with a high frequency filter of 70Hz  and a  low frequency filter of 1Hz . EEG data were recorded continuously and digitally stored. Description: No clear posterior dominant rhythm was seen. EEG showed continuous generalized 5 to 8 Hz theta-alpha activity as well as intermittent generalized 2 to 3 Hz delta slowing. Hyperventilation and photic stimulation were not performed.   ABNORMALITY -Continuous slow, generalized IMPRESSION: This study is suggestive of mild to moderate diffuse encephalopathy, nonspecific etiology. No seizures or epileptiform discharges were seen throughout the recording.   DG C-Arm 1-60 Min  Result Date: 11/12/2020 CLINICAL DATA:  Surgery, elective. Additional history provided: Intramedullary (IM) nail femoral right. Provided fluoroscopy time 1 minutes, 14 seconds (9.5 mGy). EXAM: RIGHT FEMUR 2 VIEWS; DG C-ARM 1-60 MIN COMPARISON:  Radiographs  of the hips 11/11/2020. FINDINGS: Patient with known acute right intratrochanteric hip fracture. Eight intraoperative fluoroscopic images of the right hip and femur are submitted. The images demonstrate an intramedullary nail within the femur with proximal and distal interlocking screws. There is essentially anatomic alignment. No unexpected finding. IMPRESSION: Eight intraoperative fluoroscopic images of the right hip and femur, as described. Electronically Signed   By: Jackey Loge DO   On: 11/12/2020 15:25   DG Hip Unilat  With Pelvis 2-3 Views Right  Result Date: 11/11/2020 CLINICAL DATA:  Post fall with right hip pain. EXAM: DG HIP (WITH OR WITHOUT PELVIS) 2-3V RIGHT COMPARISON:  Radiograph 10/25/2018 FINDINGS: Comminuted displaced intertrochanteric right proximal femur fracture. There is displacement of the lesser and greater trochanters and mild proximal migration of the femoral shaft. Femoral head remains seated. No additional fracture of the pelvis. Pubic rami are intact. Bones are under mineralized. Pubic symphysis and sacroiliac joints are congruent. Advanced vascular  calcifications. IMPRESSION: Comminuted displaced intertrochanteric right proximal femur fracture. Electronically Signed   By: Narda Rutherford M.D.   On: 11/11/2020 16:05   DG FEMUR, MIN 2 VIEWS RIGHT  Result Date: 11/12/2020 CLINICAL DATA:  Surgery, elective. Additional history provided: Intramedullary (IM) nail femoral right. Provided fluoroscopy time 1 minutes, 14 seconds (9.5 mGy). EXAM: RIGHT FEMUR 2 VIEWS; DG C-ARM 1-60 MIN COMPARISON:  Radiographs of the hips 11/11/2020. FINDINGS: Patient with known acute right intratrochanteric hip fracture. Eight intraoperative fluoroscopic images of the right hip and femur are submitted. The images demonstrate an intramedullary nail within the femur with proximal and distal interlocking screws. There is essentially anatomic alignment. No unexpected finding. IMPRESSION: Eight intraoperative fluoroscopic images of the right hip and femur, as described. Electronically Signed   By: Jackey Loge DO   On: 11/12/2020 15:25   DG FEMUR PORT, MIN 2 VIEWS RIGHT  Result Date: 11/12/2020 CLINICAL DATA:  Status post right hip ORIF EXAM: RIGHT FEMUR PORTABLE 2 VIEW COMPARISON:  11/11/2020 FINDINGS: Interval right hip ORIF with a a long intramedullary rod with single distal interlocking screw. Fracture fragments are in anatomic alignment. The femoral head is well seated within the right acetabulum. Avulsion of the lesser trochanter is again identified. No unexpected fracture or dislocation. Moderate bicompartmental degenerative arthritis of the right knee is incidentally noted. Trace right knee effusion. Advanced vascular calcifications are seen within the right thigh. IMPRESSION: Interval right hip ORIF. Normal alignment. Avulsion of the lesser trochanter noted. Electronically Signed   By: Helyn Numbers MD   On: 11/12/2020 16:14    Microbiology: Recent Results (from the past 240 hour(s))  SARS CORONAVIRUS 2 (TAT 6-24 HRS) Nasopharyngeal Nasopharyngeal Swab     Status: None    Collection Time: 11/17/20  4:42 PM   Specimen: Nasopharyngeal Swab  Result Value Ref Range Status   SARS Coronavirus 2 NEGATIVE NEGATIVE Final    Comment: (NOTE) SARS-CoV-2 target nucleic acids are NOT DETECTED.  The SARS-CoV-2 RNA is generally detectable in upper and lower respiratory specimens during the acute phase of infection. Negative results do not preclude SARS-CoV-2 infection, do not rule out co-infections with other pathogens, and should not be used as the sole basis for treatment or other patient management decisions. Negative results must be combined with clinical observations, patient history, and epidemiological information. The expected result is Negative.  Fact Sheet for Patients: HairSlick.no  Fact Sheet for Healthcare Providers: quierodirigir.com  This test is not yet approved or cleared by the [place] FDA and  has  been authorized for detection and/or diagnosis of SARS-CoV-2 by FDA under an Emergency Use Authorization (EUA). This EUA will remain  in effect (meaning this test can be used) for the duration of the COVID-19 declaration under Se ction 564(b)(1) of the Act, 21 U.S.C. section 360bbb-3(b)(1), unless the authorization is terminated or revoked sooner.  Performed at [facility], [name], [place], [place] [address]   Resp Panel by RT-PCR (Flu A&B, Covid) Nasopharyngeal Swab     Status: None   Collection Time: 11/20/20  1:11 PM   Specimen: Nasopharyngeal Swab; Nasopharyngeal(NP) swabs in vial transport medium  Result Value Ref Range Status   SARS Coronavirus 2 by RT PCR NEGATIVE NEGATIVE Final    Comment: (NOTE) SARS-CoV-2 target nucleic acids are NOT DETECTED.  The SARS-CoV-2 RNA is generally detectable in upper respiratory specimens during the acute phase of infection. The lowest concentration of SARS-CoV-2 viral copies this assay can detect is 138 copies/mL. A negative result  does not preclude SARS-Cov-2 infection and should not be used as the sole basis for treatment or other patient management decisions. A negative result may occur with  improper specimen collection/handling, submission of specimen other than nasopharyngeal swab, presence of viral mutation(s) within the areas targeted by this assay, and inadequate number of viral copies(<138 copies/mL). A negative result must be combined with clinical observations, patient history, and epidemiological information. The expected result is Negative.  Fact Sheet for Patients:  BloggerCourse.com  Fact Sheet for Healthcare Providers:  SeriousBroker.it  This test is no t yet approved or cleared by the [place] FDA and  has been authorized for detection and/or diagnosis of SARS-CoV-2 by FDA under an Emergency Use Authorization (EUA). This EUA will remain  in effect (meaning this test can be used) for the duration of the COVID-19 declaration under Section 564(b)(1) of the Act, 21 U.S.C.section 360bbb-3(b)(1), unless the authorization is terminated  or revoked sooner.       Influenza A by PCR NEGATIVE NEGATIVE Final   Influenza B by PCR NEGATIVE NEGATIVE Final    Comment: (NOTE) The Xpert Xpress SARS-CoV-2/FLU/RSV plus assay is intended as an aid in the diagnosis of influenza from Nasopharyngeal swab specimens and should not be used as a sole basis for treatment. Nasal washings and aspirates are unacceptable for Xpert Xpress SARS-CoV-2/FLU/RSV testing.  Fact Sheet for Patients: BloggerCourse.com  Fact Sheet for Healthcare Providers: SeriousBroker.it  This test is not yet approved or cleared by the [place] FDA and has been authorized for detection and/or diagnosis of SARS-CoV-2 by FDA under an Emergency Use Authorization (EUA). This EUA will remain in effect (meaning this test can be used) for  the duration of the COVID-19 declaration under Section 564(b)(1) of the Act, 21 U.S.C. section 360bbb-3(b)(1), unless the authorization is terminated or revoked.  Performed at [facility], [name] [place], [place] [address]   Urine culture     Status: Abnormal   Collection Time: 11/20/20  9:20 PM   Specimen: Urine, Clean Catch  Result Value Ref Range Status   Specimen Description URINE, CLEAN CATCH  Final   Special Requests   Final    ADDED 2120 Performed at [facility], [name] [place], [place] [address]    Culture (A)  Final    >=100,000 COLONIES/mL ENTEROCOCCUS FAECALIS 30,000 COLONIES/mL YEAST    Report Status 11/23/2020 FINAL  Final   Organism ID, Bacteria ENTEROCOCCUS FAECALIS (A)  Final  Susceptibility   Enterococcus faecalis - MIC*    AMPICILLIN <=2 SENSITIVE Sensitive     NITROFURANTOIN <=16 SENSITIVE Sensitive     VANCOMYCIN 1 SENSITIVE Sensitive     * >=100,000 COLONIES/mL ENTEROCOCCUS FAECALIS     Labs: CBC: Recent Labs  Lab 11/18/20 0046 11/20/20 1320 11/21/20 0915 11/22/20 0810 11/23/20 0323  WBC 6.2 9.7 8.7 6.2 5.5  NEUTROABS  --  7.3  --   --   --   HGB 9.3* 10.5* 10.6* 9.6* 9.3*  HCT 27.3* 34.1* 34.1* 30.3* 28.2*  MCV 86.1 92.4 91.9 91.3 89.0  PLT 245 364 385 379 392   Basic Metabolic Panel: Recent Labs  Lab 11/20/20 1320 11/21/20 0915 11/22/20 0810 11/23/20 0323  NA 137 137 139 136  K 3.6 3.3* 3.3* 3.5  CL 97* 97* 100 101  CO2 30 29 28 27   GLUCOSE 109* 148* 98 109*  BUN 18 18 12 14   CREATININE 0.74 0.70 0.67 0.71  CALCIUM 8.6* 8.7* 8.5* 8.4*  MG  --   --  2.1 2.0   Liver Function Tests: Recent Labs  Lab 11/20/20 1320 11/21/20 0915  AST 22 21  ALT 17 16  ALKPHOS 56 65  BILITOT 0.6 0.6  PROT 5.6* 5.9*  ALBUMIN 2.6* 2.7*   CBG: Recent Labs  Lab 11/20/20 1309 11/21/20 0917  GLUCAP 113* 125*    Time spent: 35 minutes  Signed:  01/18/21  [facility]  11/23/2020 1:58 PM

## 2020-11-23 NOTE — [place] FL2 (Signed)
[place] MEDICAID FL2 LEVEL OF CARE SCREENING TOOL     IDENTIFICATION  Patient Name: Claudia [name] Birthdate: [birth year] Sex: female Admission Date (Current Location): 11/20/2020  [facility] and [place] Number:  Producer, television/film/video and Address:  [facility] [place], [address], [place], [place] [address]      Provider Number: [id number]  Attending Physician Name and Address:  Rolly Salter, MD  Relative Name and Phone Number:  Camille Bal - daughter - [phone] (mobile); [phone] (home)    Current Level of Care: Hospital Recommended Level of Care: Skilled Nursing Facility Prior Approval Number:    Date Approved/Denied:   PASRR Number: [id number]  Discharge Plan: SNF    Current Diagnoses: Patient Active Problem List   Diagnosis Date Noted  . AMS (altered mental status) 11/20/2020  . Closed comminuted intertrochanteric fracture of proximal femur, right, initial encounter (HCC) 11/11/2020  . Closed right hip fracture (HCC) 11/11/2020  . Pain   . Palliative care by specialist   . Goals of care, counseling/discussion   . DNR (do not resuscitate)   . DNI (do not intubate)   . Encounter for hospice care discussion   . Fall at home, initial encounter 07/28/2020  . Fall 07/28/2020  . Weakness   . Closed fracture of nasal bones   . Acute cystitis without hematuria   . Pain management contract signed 04/08/2020  . Protein-calorie malnutrition, severe (HCC) 03/11/2020  . Hyperlipidemia 12/20/2017  . Routine general medical examination at a health care facility 12/19/2016  . Insomnia 06/19/2015  . Narcotic dependence (HCC) 12/13/2013  . Chronic back pain 12/13/2013  . Hypertension     Orientation RESPIRATION BLADDER Height & Weight     Self,Place  Normal Incontinent,External catheter (Urinary cath placed 11/14/20) Weight:   Height:     BEHAVIORAL SYMPTOMS/MOOD NEUROLOGICAL BOWEL NUTRITION STATUS      Incontinent Diet (Low sodium - heart  healthy)  AMBULATORY STATUS COMMUNICATION OF NEEDS Skin   Total Care (Patient was unable to ambulate with PT during eval on 2/14) Verbally Skin abrasions (Abrasion left leg with foam dressing)                       Personal Care Assistance Level of Assistance  Bathing,Feeding,Dressing Bathing Assistance: Maximum assistance Feeding assistance: Limited assistance (Assistance with set-up) Dressing Assistance: Maximum assistance     Functional Limitations Info  Sight,Hearing,Speech Sight Info: Impaired (Per nursing notes) Hearing Info: Adequate Speech Info: Adequate    SPECIAL CARE FACTORS FREQUENCY  PT (By licensed PT)     PT Frequency: Evaluated 2/14 - PT at SNF eval and treat, a minimum of 5 days per week              Contractures Contractures Info: Not present    Additional Factors Info  Code Status,Allergies Code Status Info: DNR Allergies Info: Amitiza (Lubiprostone), Benadryl (Diphenhydramine), Ultram, Aspirin, Hm Lidocaine Patch (Lidocaine), Ibuprofen, Sulfa antibiotics           Current Medications (11/23/2020):  This is the current hospital active medication list Current Facility-Administered Medications  Medication Dose Route Frequency Provider Last Rate Last Admin  . acetaminophen (TYLENOL) tablet 500 mg  500 mg Oral Q6H PRN Rometta Emery, MD   500 mg at 11/22/20 2145  . amoxicillin (AMOXIL) capsule 500 mg  500 mg Oral Q8H Rolly Salter, MD   500 mg at 11/23/20 0511  . Chlorhexidine Gluconate Cloth 2 %  PADS 6 each  6 each Topical Daily Rolly Salter, MD   6 each at 11/22/20 1450  . diltiazem (CARDIZEM CD) 24 hr capsule 240 mg  240 mg Oral QPM Earlie Lou L, MD   240 mg at 11/22/20 1824  . docusate sodium (COLACE) capsule 100 mg  100 mg Oral Daily Earlie Lou L, MD   100 mg at 11/23/20 1123  . enoxaparin (LOVENOX) injection 30 mg  30 mg Subcutaneous Q24H Earlie Lou L, MD   30 mg at 11/23/20 1124  . fentaNYL (DURAGESIC) 100 MCG/HR 1  patch  1 patch Transdermal Q48H Rolly Salter, MD      . fluconazole (DIFLUCAN) tablet 100 mg  100 mg Oral Daily Tamera Reason, RPH   100 mg at 11/23/20 1123  . mirtazapine (REMERON) tablet 15 mg  15 mg Oral QHS Rometta Emery, MD   15 mg at 11/22/20 2145  . ondansetron (ZOFRAN) tablet 4 mg  4 mg Oral Q6H PRN Rometta Emery, MD       Or  . ondansetron (ZOFRAN) injection 4 mg  4 mg Intravenous Q6H PRN Garba, Mohammad L, MD      . polyethylene glycol (MIRALAX / GLYCOLAX) packet 17 g  17 g Oral Daily PRN Rometta Emery, MD      . vitamin B-12 (CYANOCOBALAMIN) tablet 1,000 mcg  1,000 mcg Oral Daily Rolly Salter, MD   1,000 mcg at 11/23/20 1123  . Vitamin D (Ergocalciferol) (DRISDOL) capsule 50,000 Units  50,000 Units Oral Q Ala Dach, Zachery Conch, MD   50,000 Units at 11/21/20 1006     Discharge Medications: Please see discharge summary for a list of discharge medications.  Relevant Imaging Results:  Relevant Lab Results:   Additional Information ss#[id number]. Patient has had COVID vaccinations: 11/21/19, 12/14/19, 08/20/20  Muaad Boehning, Lazaro Arms, LCSW

## 2020-11-23 NOTE — Procedures (Signed)
Patient Name: Claudia [name]  MRN: [id number]  Epilepsy Attending: Charlsie Quest  Referring Physician/Provider: Dr. Lynden Oxford Date: 11/23/2020  Duration: 25.11 mins  Patient history: 87 year old female with altered mental status. EEG to evaluate for seizures.  Level of alertness: Awake  AEDs during EEG study: None  Technical aspects: This EEG study was done with scalp electrodes positioned according to the 10-20 International system of electrode placement. Electrical activity was acquired at a sampling rate of 500Hz  and reviewed with a high frequency filter of 70Hz  and a low frequency filter of 1Hz . EEG data were recorded continuously and digitally stored.   Description: No clear posterior dominant rhythm was seen. EEG showed continuous generalized 5 to 8 Hz theta-alpha activity as well as intermittent generalized 2 to 3 Hz delta slowing. Hyperventilation and photic stimulation were not performed.     ABNORMALITY -Continuous slow, generalized  IMPRESSION: This study is suggestive of mild to moderate diffuse encephalopathy, nonspecific etiology. No seizures or epileptiform discharges were seen throughout the recording.  Aaren Atallah 

## 2020-11-23 NOTE — Plan of Care (Signed)

## 2020-11-24 DIAGNOSIS — I10 Essential (primary) hypertension: Secondary | ICD-10-CM

## 2020-11-24 NOTE — Care Management Important Message (Signed)
Important Message  Patient Details  Name: Claudia [name] MRN: [id number] Date of Birth: [birth year]   Medicare Important Message Given:  Yes     Waymon Laser P Anett Ranker 11/24/2020, 3:03 PM

## 2020-11-24 NOTE — Plan of Care (Signed)
  Problem: Education: Goal: Knowledge of General Education information will improve Description: Including pain rating scale, medication(s)/side effects and non-pharmacologic comfort measures Outcome: Completed/Met   Problem: Health Behavior/Discharge Planning: Goal: Ability to manage health-related needs will improve Outcome: Completed/Met   Problem: Clinical Measurements: Goal: Ability to maintain clinical measurements within normal limits will improve Outcome: Completed/Met   Problem: Clinical Measurements: Goal: Will remain free from infection Outcome: Completed/Met   Problem: Clinical Measurements: Goal: Diagnostic test results will improve Outcome: Completed/Met

## 2020-11-24 NOTE — TOC Transition Note (Signed)
Transition of Care [facility]) - CM/SW Discharge Note *Discharged to [facility]   Patient Details  Name: Claudia [name] MRN: [id number] Date of Birth: [birth year]  Transition of Care [facility]) CM/SW Contact:  Cristobal Goldmann, LCSW Phone Number: 11/24/2020, 4:16 PM   Clinical Narrative:  Patient medically stable and will discharge to [facility] today. Discharge clinicals transmitted to facility and non-emergency ambulance transport arranged. Daughter, Camille Bal - [phone] contacted (4:21 pm) regarding discharge      Final next level of care: Skilled Nursing Facility Barriers to Discharge: Barriers Resolved   Patient Goals and CMS Choice Patient states their goals for this hospitalization and ongoing recovery are:: Patient and daughter agreeable to patient continuing rehab at discharge CMS Medicare.gov Compare Post Acute Care list provided to:: Other (Comment Required) (Not provided as daughter informed CSW of facility preference) Choice offered to / list presented to : NA  Discharge Placement   Existing PASRR number confirmed : 11/23/20          Patient chooses bed at: [facility] Patient to be transferred to facility by: PTAR Name of family member notified: Camille Bal - daughter [phone]) Patient and family notified of of transfer: 11/24/20  Discharge Plan and Services In-house Referral: Clinical Social Work                                  Social Determinants of Health (SDOH) Interventions  No SDOH interventions requested or needed at discharge   Readmission Risk Interventions No flowsheet data found.

## 2020-11-24 NOTE — Progress Notes (Signed)
Bladder scanned showed of urine. sent secure chat to oncall night provider MD Opyd and made aware. order to do IN and Out cath, this RN able to take out 900cc of urine. Patient denies complaints at this time.

## 2020-11-24 NOTE — Discharge Summary (Signed)
Addendum discharge summary  Date of discharge 11/24/2020  Patient's Foley catheter was removed yesterday, however patient was unable to void.  Bladder scan this morning showed 740 mL of urine.  And out cath was performed.  At this time will reinsert Foley catheter.  Patient will go to skilled nursing facility with Foley catheter, consider voiding trial in next 4 to 5 days at skilled nursing facility.  See details of discharge summary from Dr. Allena Katz as of 11/23/2020.

## 2020-11-24 NOTE — Progress Notes (Signed)
Patient temperature 100.4, increased agitation and confusion, MD notified and asked to hold discharge.  Notified patient's daughter, and PTAR canceled, called [facility]

## 2020-11-24 NOTE — Plan of Care (Signed)
  Problem: Clinical Measurements: Goal: Cardiovascular complication will be avoided Outcome: Progressing   Problem: Activity: Goal: Risk for activity intolerance will decrease Outcome: Progressing   Problem: Nutrition: Goal: Adequate nutrition will be maintained Outcome: Progressing   

## 2020-11-25 NOTE — Progress Notes (Signed)
Addendum discharge summary   Date of admission-11/20/2020 Date of discharge-11/25/2020  Patient's Foley catheter was removed on 11/23/2020, patient was unable to void.  Bladder scan showed 740 mL urine.  Foley catheter was placed.  At this time patient will be discharged with Foley catheter, consider voiding trial in next 4 to 5 days at 6 skilled nursing facility.  If patient continues to have urinary retention, consider urology evaluation as outpatient.  Patient also work-up low-grade fever yesterday which has resolved.  She is afebrile.  This likely from underlying UTI.  Patient is already on amoxicillin.  Will continue amoxicillin for 3 more days to complete 7 days of treatment. Details of medications can be seen from Dr. Eliane Decree discharge summary from 11/23/2020.

## 2020-11-25 NOTE — Progress Notes (Signed)
DISCHARGE NOTE HOME Claudia [name] to be discharged SNF per MD order. Discussed prescriptions and follow up appointments with the patient. Prescriptions given to patient; medication list explained in detail. Patient verbalized understanding.  Skin clean, dry and intact without evidence of skin break down, no evidence of skin tears noted. IV catheter discontinued intact. Site without signs and symptoms of complications. Dressing and pressure applied. Pt denies pain at the site currently. No complaints noted.  Patient free of lines, drains, and wounds.   An After Visit Summary (AVS) was printed and given to the patient. Patient escorted via wheelchair, and discharged home via private auto.  Lorine Bears, RN

## 2020-11-25 NOTE — Progress Notes (Signed)
PT Cancellation Note  Patient Details Name: Claudia [name] MRN: [id number] DOB: [birth year]   Cancelled Treatment:    Reason Eval/Treat Not Completed: Other (comment) per RN, patient leaving for SNF today, currently waiting on PTAR. Since DC is imminent, holding PT for now. Thank you for the opportunity to participate in her care.    Madelaine Etienne, DPT, PN1   Supplemental Physical Therapist [facility]    Pager [phone] Acute Rehab Office [phone]

## 2020-11-25 NOTE — TOC Transition Note (Signed)
Transition of Care [facility]) - CM/SW Discharge Note   Patient Details  Name: Claudia [name] MRN: [id number] Date of Birth: 09/05/1931  Transition [facility] Care [facility]) CM/SW Contact:  Lynett Grimes Phone Number: 11/25/2020, 1:05 PM   Clinical Narrative:    Patient medically stable for discharge and going to [facility] today. Facility received discharge summary on 2/17. Non-emergency ambulance transportation was contacted on 2/17, Ms. Husak's daughter Camille Bal [phone]) was informed about the discharge and ambulance transport.    Final next level of care: Skilled Nursing Facility Barriers to Discharge: Barriers Resolved   Patient Goals and CMS Choice Patient states their goals for this hospitalization and ongoing recovery are:: Patient and daughter are agreeable to short term rehab before returning home. CMS Medicare.gov Compare Post Acute Care list provided to:: Patient (Daughter was informed about [email or web address]) Choice offered to / list presented to : Adult Children  Discharge Placement   Existing PASRR number confirmed : 11/23/20          Patient chooses bed at: [facility] Patient to be transferred to facility by: PTAR Name of family member notified: Camille Bal, Daughter Patient and family notified of of transfer: 11/25/20  Discharge Plan and Services In-house Referral: Clinical Social Work                DME Agency: Other - Comment [employer])                  Social Determinants of Health (SDOH) Interventions     Readmission Risk Interventions No flowsheet data found.

## 2020-12-03 LAB — VITAMIN B1: Vitamin B1 (Thiamine): 127.2 nmol/L (ref 66.5–200.0)

## 2021-01-11 ENCOUNTER — Other Ambulatory Visit: Payer: Self-pay | Admitting: *Deleted

## 2021-01-11 NOTE — Patient Outreach (Signed)
[facility]) Care Management  01/11/2021  Ellyse Rotolo Mohr May 19, 1931 [id number]  Unsuccessful outreach attempt made to patient's daughter Camille Bal. RN Health Coach left HIPAA compliant voicemail message along with her contact information.  Plan: RN Health Coach will call patient within the month of May.  Blanchie Serve RN, BSN [facility] Care Management  RN Health Coach [phone] [email or web address]

## 2021-02-07 ENCOUNTER — Other Ambulatory Visit: Payer: Self-pay | Admitting: *Deleted

## 2021-02-07 NOTE — Patient Outreach (Signed)
[facility]) Care Management  02/07/2021  Claudia [name] [birth year] [id number]   Successful telephone outreach call to patient's daughter Claudia [name]. HIPAA identifiers obtained. Daughter explained that the patient is residing at [facility]. She added that the patient's mental capacity has changed rapidly and she has end-stage dementia. Nurse discussed with daughter that she would close patient's case due to the severity of her illness and daughter verbalized understanding.   Plan: Will close patient's case and will send Physician case closed letter.  Blanchie Serve RN, Mining engineer [facility] [phone] [email or web address]

## 2021-02-10 ENCOUNTER — Ambulatory Visit: Payer: Medicare Other | Admitting: Neurology

## 2021-09-08 DEATH — deceased

## 2021-11-04 IMAGING — CR DG HIP (WITH OR WITHOUT PELVIS) 2-3V*R*
3 series · 3 of 3 positions shown · non-contrast
Comparison: Radiograph 10/25/2018

CLINICAL DATA: Post fall with right hip pain.

EXAM:
DG HIP (WITH OR WITHOUT PELVIS) 2-3V RIGHT

[pelvis ap]
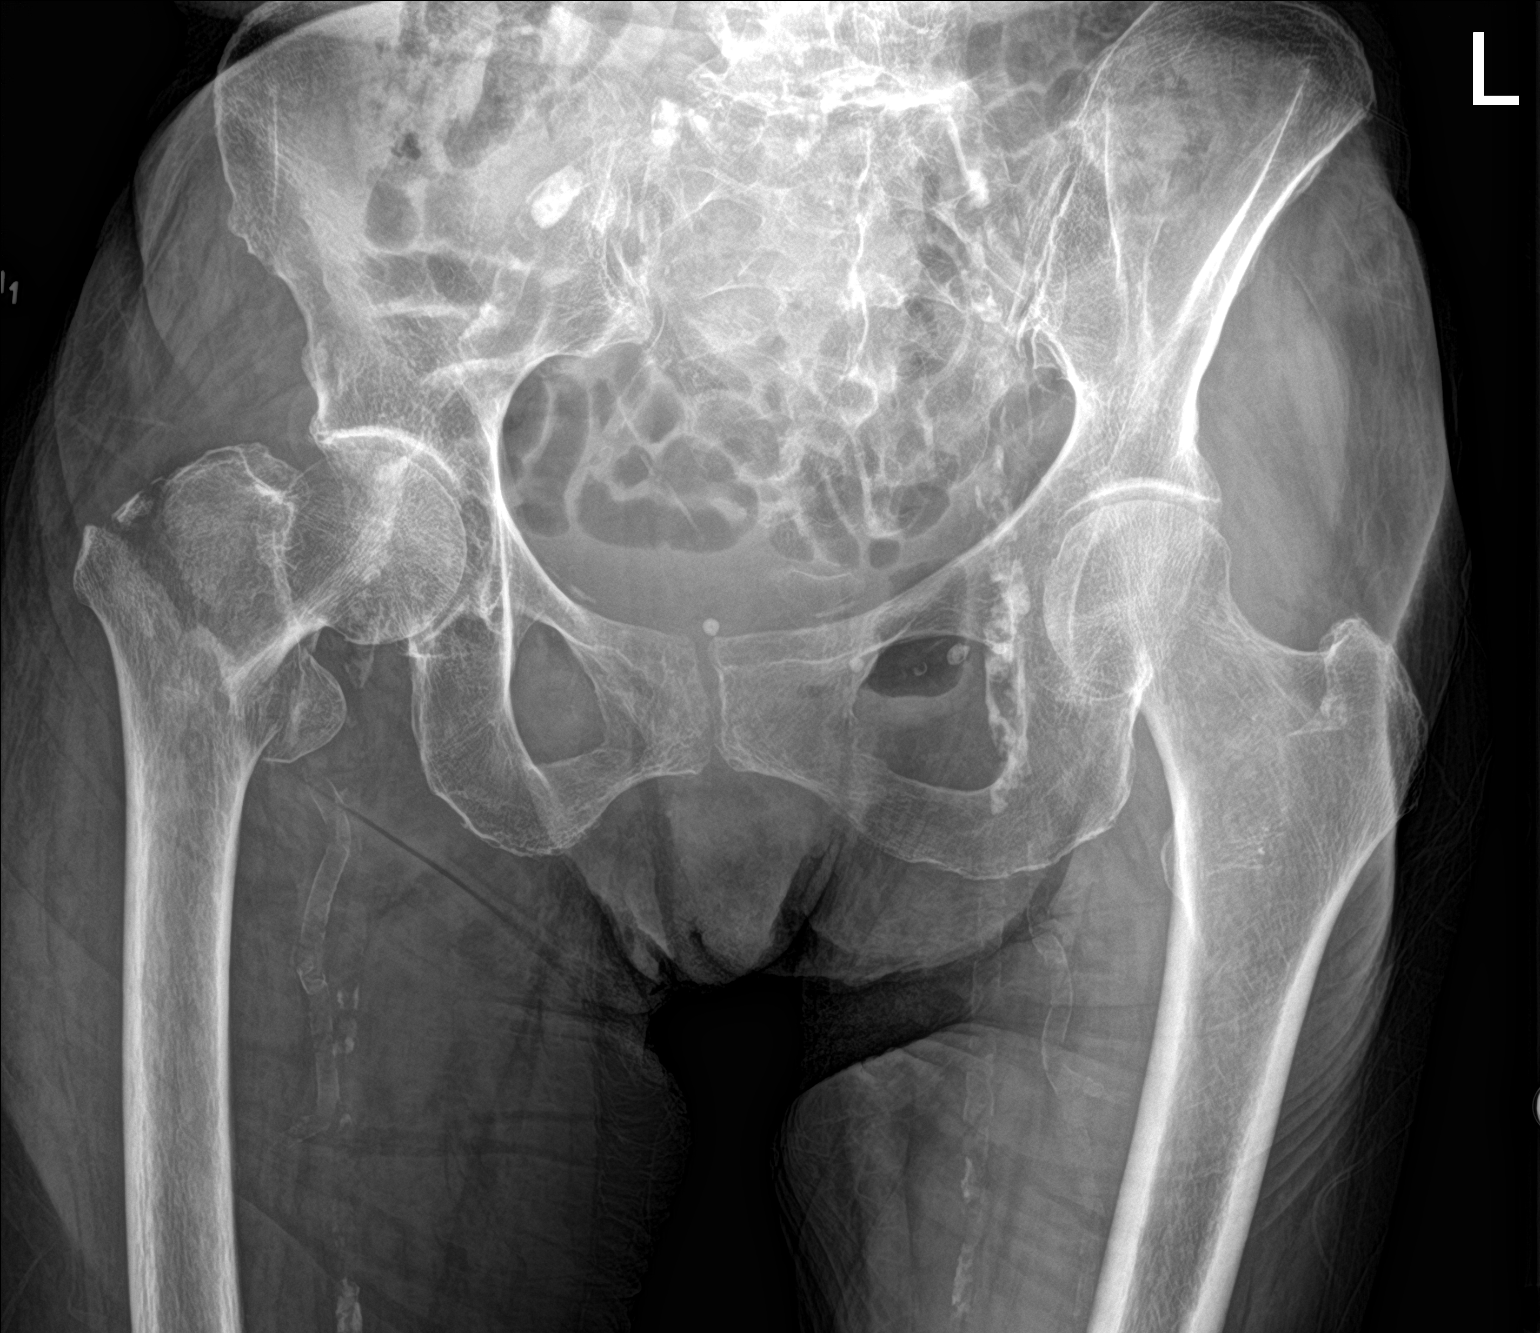

[hip ap]
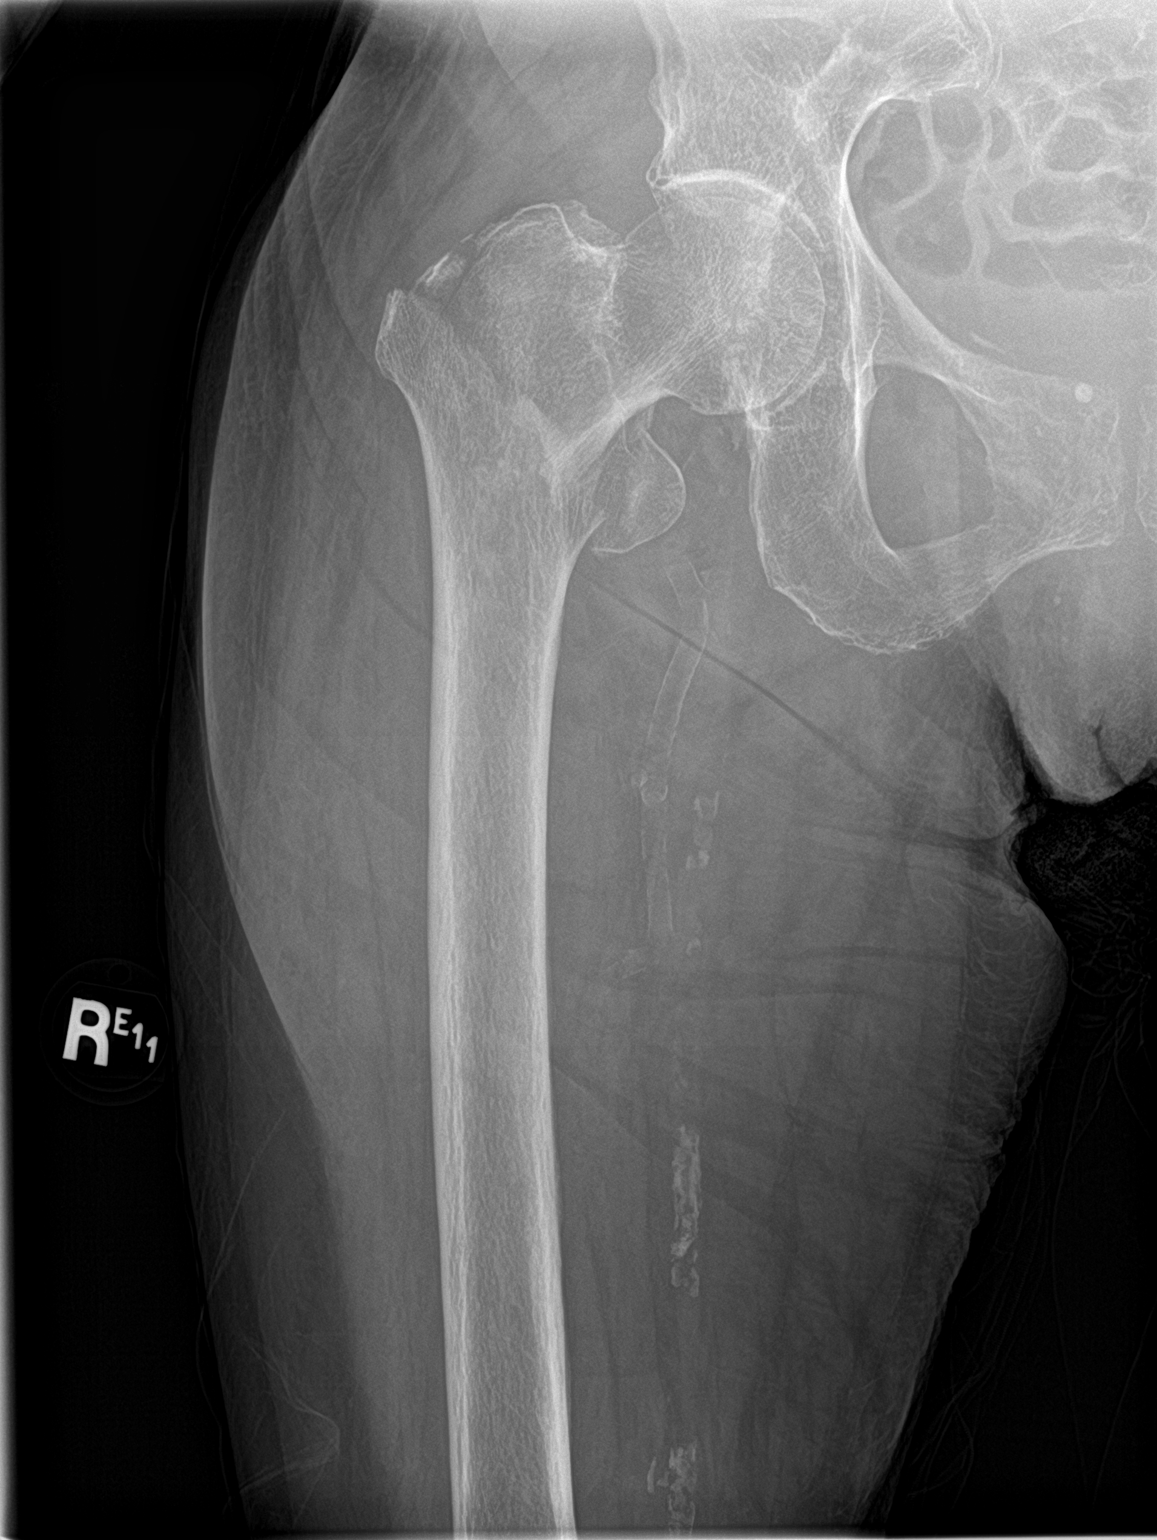

[hip x-table]
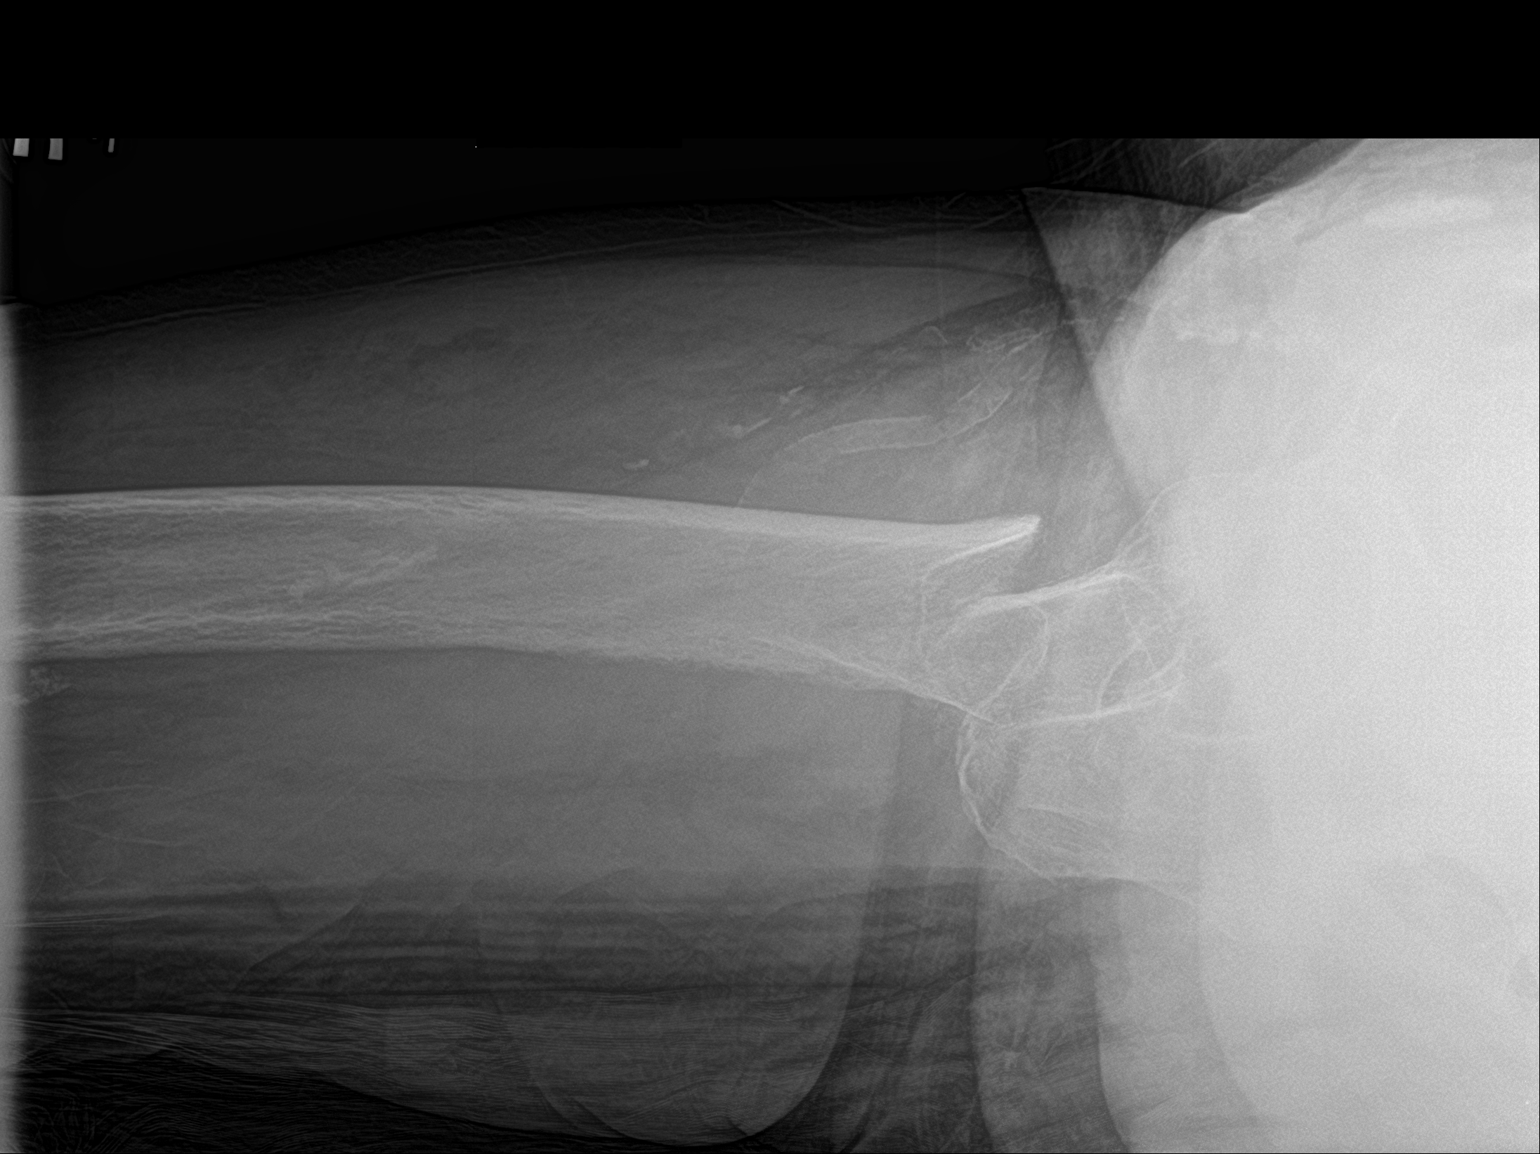

[3 of 3 positions shown; findings below may reference images not displayed]

FINDINGS: Comminuted displaced intertrochanteric right proximal femur
fracture. There is displacement of the lesser and greater
trochanters and mild proximal migration of the femoral shaft.
Femoral head remains seated. No additional fracture of the pelvis.
Pubic rami are intact. Bones are under mineralized. Pubic symphysis
and sacroiliac joints are congruent. Advanced vascular
calcifications.
IMPRESSION: Comminuted displaced intertrochanteric right proximal femur
fracture.

## 2021-11-04 IMAGING — CR DG CHEST 1V
1 series · 1 of 1 positions shown · non-contrast
Comparison: Radiograph 07/28/2020

CLINICAL DATA: Post fall with right hip fracture.

EXAM:
CHEST  1 VIEW

[chest ap]
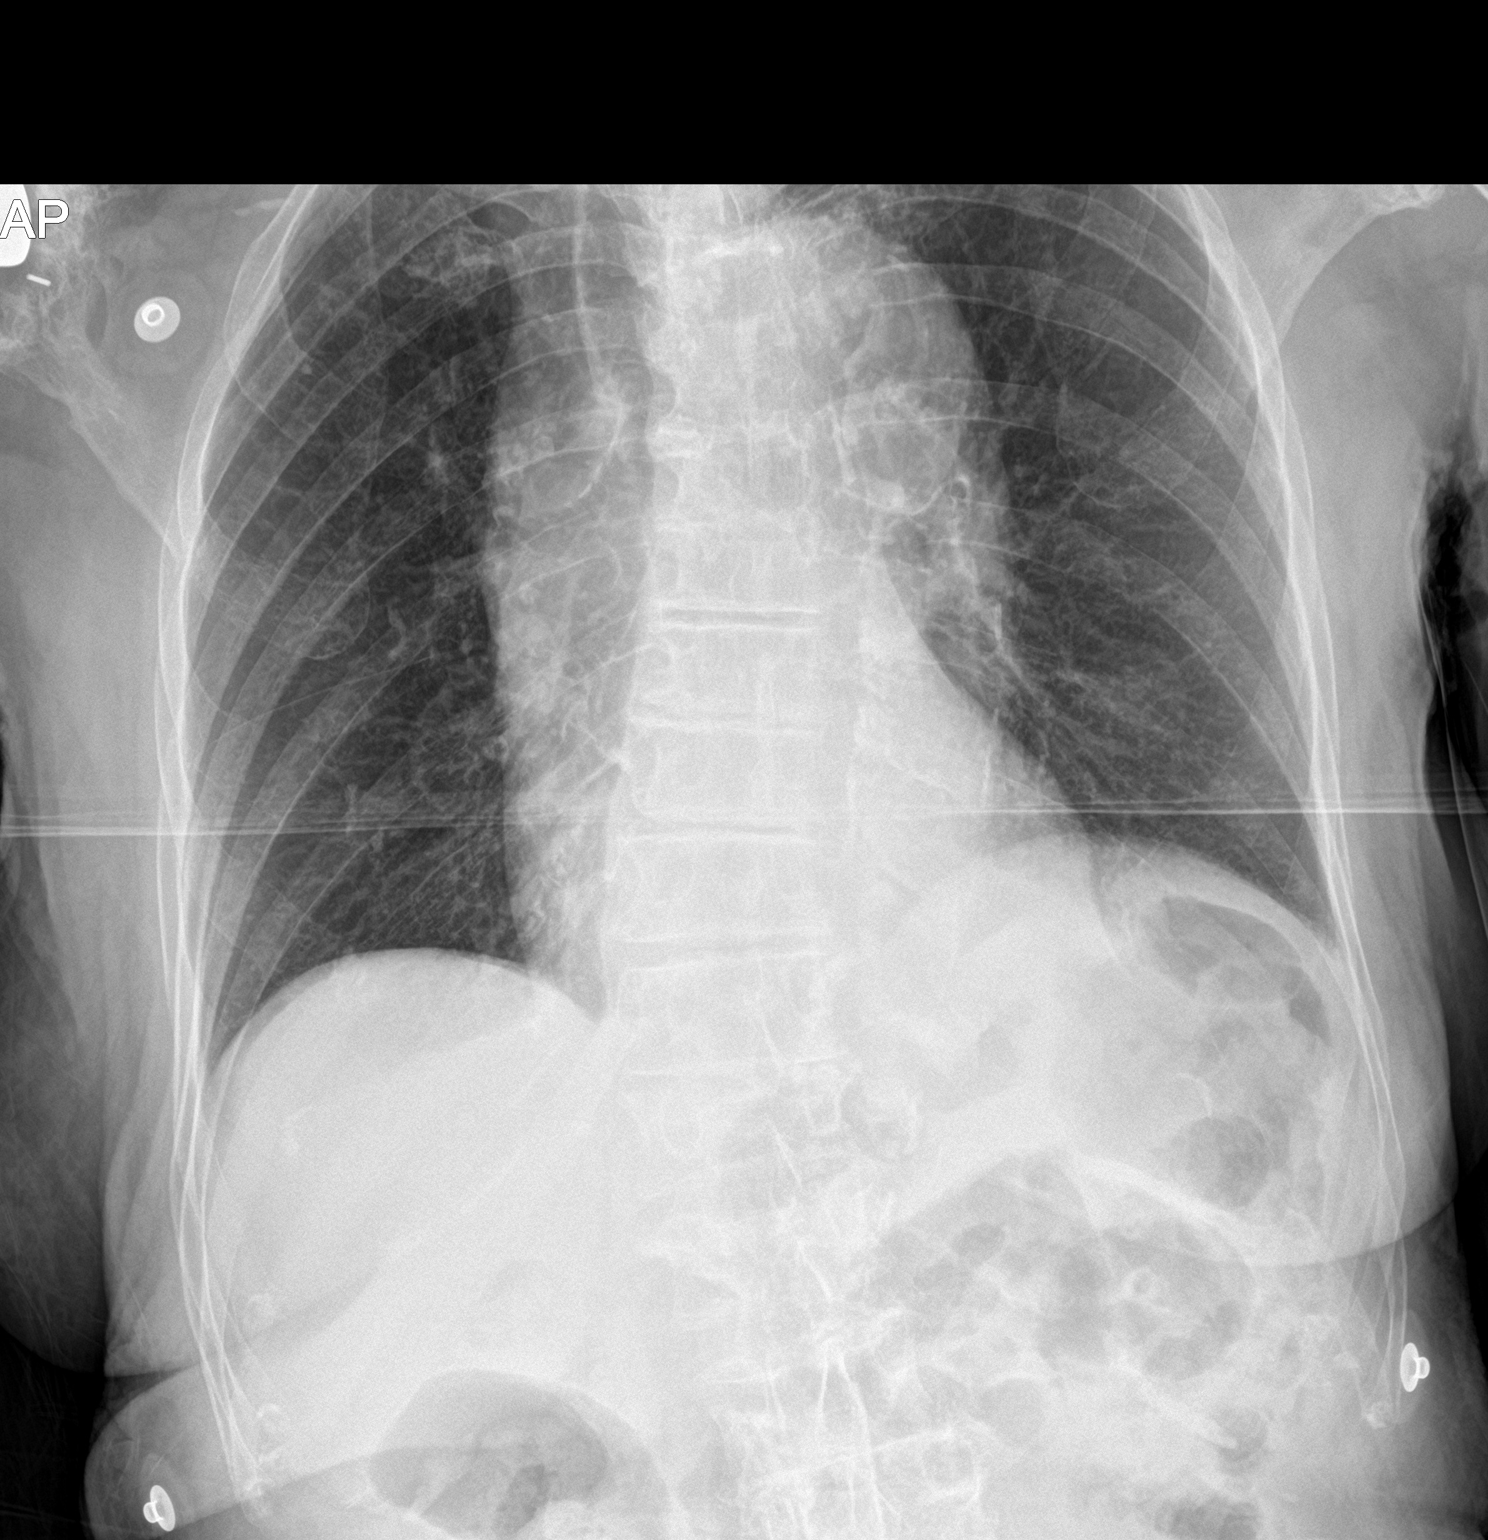

[1 of 1 positions shown; findings below may reference images not displayed]

FINDINGS: Patient is rotated. The heart is normal in size. There is aortic
atherosclerosis and tortuosity, tortuosity exaggerated by rotation.
Left lung apex not entirely included in the field of view. No
pneumothorax or focal airspace disease. No pleural fluid. Bones
under mineralized. Scoliotic curvature of the upper lumbar spine. No
acute osseous abnormalities are seen.
IMPRESSION: 1. No acute abnormality.
2. Aortic atherosclerosis and tortuosity, tortuosity exaggerated by
rotation.

Aortic Atherosclerosis (EFER1-8OH.H).

## 2021-11-13 IMAGING — DX DG CHEST 1V PORT
1 series · 1 of 1 positions shown · non-contrast
Comparison: 11/11/2020

CLINICAL DATA: Altered mental status

EXAM:
PORTABLE CHEST 1 VIEW

[chest]
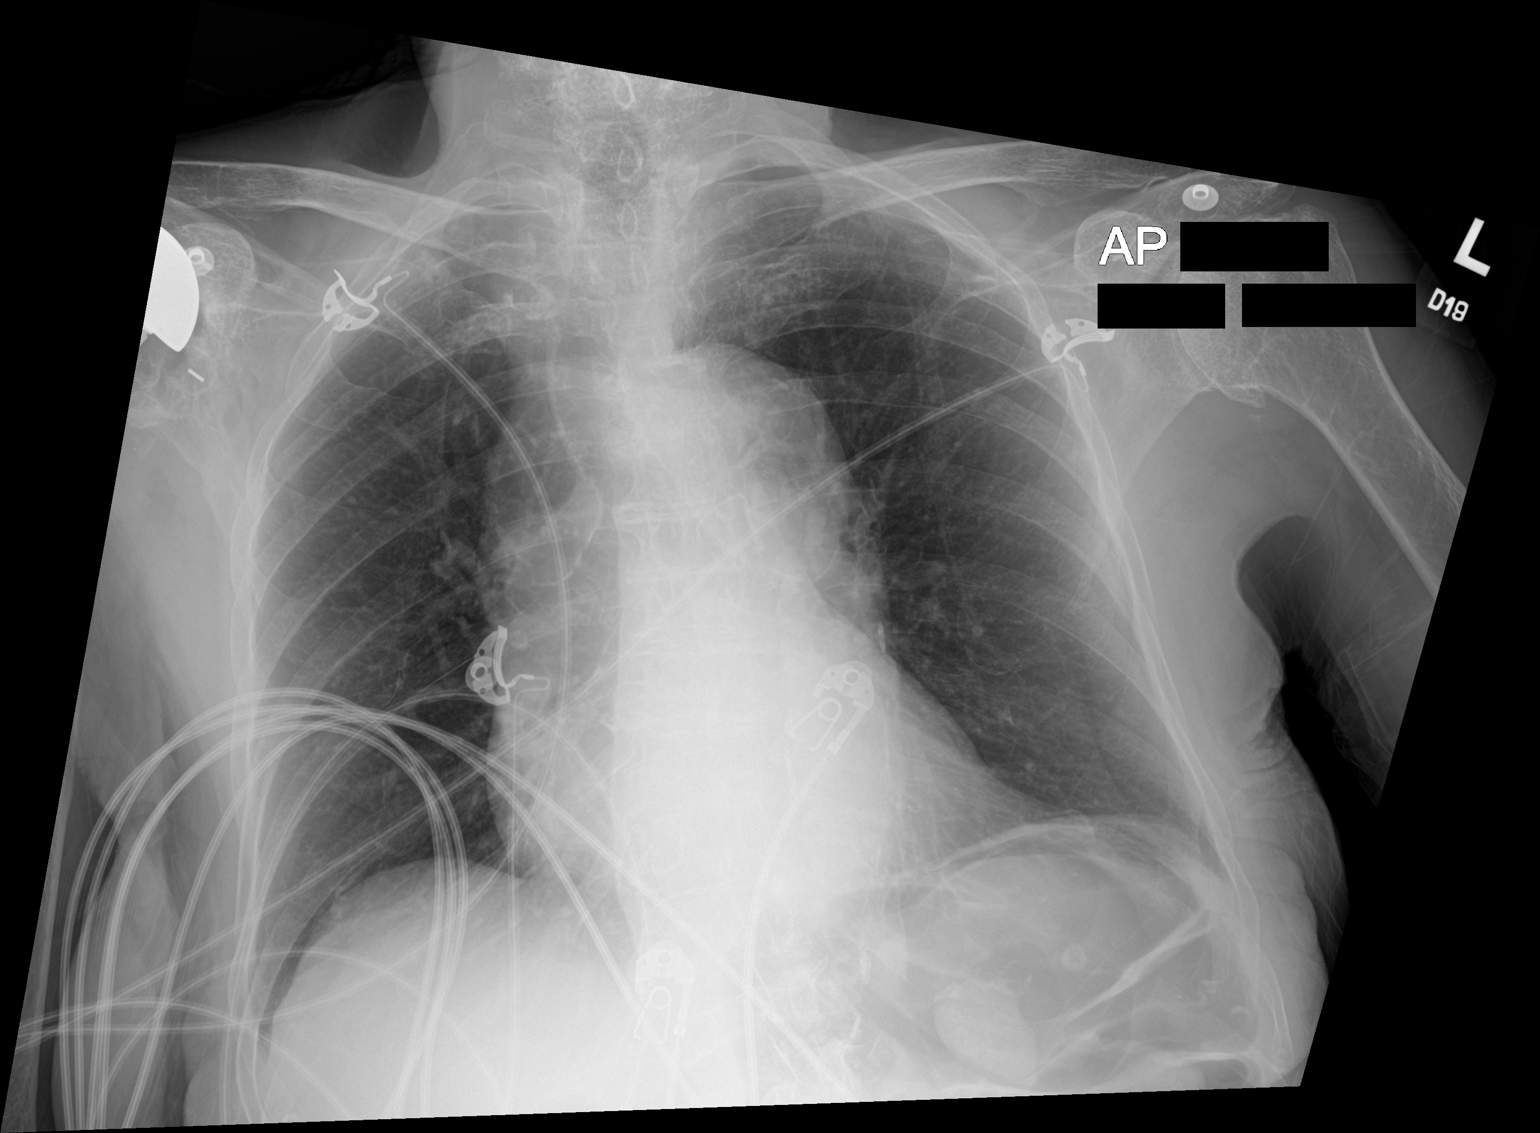

[1 of 1 positions shown; findings below may reference images not displayed]

FINDINGS: The heart is normal in size. Unchanged prominence of the aorta.
Both lungs are clear. The visualized skeletal structures are
unremarkable.
IMPRESSION: 1. No acute abnormality of the lungs.
2. Unchanged prominence of the aorta, suggesting aortic aneurysm.
Recommend follow-up CT angiogram of the chest on a nonemergent basis
in the absence of acutely referable signs and symptoms.
